# Patient Record
Sex: Female | Born: 1940 | Race: White | Hispanic: No | Marital: Single | Smoking: Former smoker
Health system: Southern US, Community
[De-identification: ages and names within clinical notes are randomized; demographics above are authoritative.]

## PROBLEM LIST (undated history)

## (undated) DIAGNOSIS — F419 Anxiety disorder, unspecified: Secondary | ICD-10-CM

## (undated) DIAGNOSIS — I10 Essential (primary) hypertension: Secondary | ICD-10-CM

## (undated) HISTORY — PX: CHOLECYSTECTOMY: SHX55

## (undated) HISTORY — PX: HERNIA REPAIR: SHX51

---

## 1997-09-06 ENCOUNTER — Other Ambulatory Visit: Admission: RE | Admit: 1997-09-06 | Discharge: 1997-09-06 | Payer: Self-pay | Admitting: Gynecology

## 1998-10-21 ENCOUNTER — Other Ambulatory Visit: Admission: RE | Admit: 1998-10-21 | Discharge: 1998-10-21 | Payer: Self-pay | Admitting: Gynecology

## 1999-11-03 ENCOUNTER — Other Ambulatory Visit: Admission: RE | Admit: 1999-11-03 | Discharge: 1999-11-03 | Payer: Self-pay | Admitting: Gynecology

## 2001-02-01 ENCOUNTER — Other Ambulatory Visit: Admission: RE | Admit: 2001-02-01 | Discharge: 2001-02-01 | Payer: Self-pay | Admitting: Gynecology

## 2002-02-13 ENCOUNTER — Other Ambulatory Visit: Admission: RE | Admit: 2002-02-13 | Discharge: 2002-02-13 | Payer: Self-pay | Admitting: Gynecology

## 2003-05-28 ENCOUNTER — Other Ambulatory Visit: Admission: RE | Admit: 2003-05-28 | Discharge: 2003-05-28 | Payer: Self-pay | Admitting: Gynecology

## 2004-06-05 ENCOUNTER — Other Ambulatory Visit: Admission: RE | Admit: 2004-06-05 | Discharge: 2004-06-05 | Payer: Self-pay | Admitting: Gynecology

## 2008-12-19 ENCOUNTER — Encounter: Payer: Self-pay | Admitting: Pulmonary Disease

## 2009-03-07 ENCOUNTER — Encounter: Payer: Self-pay | Admitting: Pulmonary Disease

## 2009-03-12 ENCOUNTER — Encounter: Payer: Self-pay | Admitting: Pulmonary Disease

## 2009-04-16 ENCOUNTER — Encounter: Payer: Self-pay | Admitting: Pulmonary Disease

## 2009-04-18 ENCOUNTER — Encounter: Payer: Self-pay | Admitting: Pulmonary Disease

## 2009-05-20 DIAGNOSIS — J449 Chronic obstructive pulmonary disease, unspecified: Secondary | ICD-10-CM

## 2009-05-20 DIAGNOSIS — R059 Cough, unspecified: Secondary | ICD-10-CM | POA: Insufficient documentation

## 2009-05-20 DIAGNOSIS — J309 Allergic rhinitis, unspecified: Secondary | ICD-10-CM

## 2009-05-20 DIAGNOSIS — K219 Gastro-esophageal reflux disease without esophagitis: Secondary | ICD-10-CM | POA: Insufficient documentation

## 2009-05-20 DIAGNOSIS — R05 Cough: Secondary | ICD-10-CM

## 2009-05-20 DIAGNOSIS — J4489 Other specified chronic obstructive pulmonary disease: Secondary | ICD-10-CM

## 2009-05-20 HISTORY — DX: Other specified chronic obstructive pulmonary disease: J44.89

## 2009-05-20 HISTORY — DX: Gastro-esophageal reflux disease without esophagitis: K21.9

## 2009-05-20 HISTORY — DX: Allergic rhinitis, unspecified: J30.9

## 2009-05-20 HISTORY — DX: Cough, unspecified: R05.9

## 2009-05-21 ENCOUNTER — Ambulatory Visit: Payer: Self-pay | Admitting: Pulmonary Disease

## 2009-05-21 DIAGNOSIS — F17200 Nicotine dependence, unspecified, uncomplicated: Secondary | ICD-10-CM

## 2009-05-21 HISTORY — DX: Nicotine dependence, unspecified, uncomplicated: F17.200

## 2009-05-28 ENCOUNTER — Telehealth (INDEPENDENT_AMBULATORY_CARE_PROVIDER_SITE_OTHER): Payer: Self-pay | Admitting: *Deleted

## 2009-06-24 ENCOUNTER — Ambulatory Visit: Payer: Self-pay | Admitting: Pulmonary Disease

## 2009-07-25 ENCOUNTER — Ambulatory Visit: Payer: Self-pay | Admitting: Pulmonary Disease

## 2010-03-27 NOTE — Assessment & Plan Note (Signed)
Summary: CHRONIC COUGH/CB   Primary Provider/Referring Provider:  Dr. Simone Curia  CC:  Pulmonary consult for chronic cough x4 months or longer. SOB with coughing spells..  History of Present Illness: 70 yo female for evaluation of cough.  Her cough has been present for several months.  She was told that she had pneumonia last fall.  With this she developed a cough with with thick, white sputum.  She did not have hemoptysis.  She would also get wheeze at night.  She did not have fever.  She was treated with antibiotics and prednisone.  These seemed to help.  She has been tried on several inhalers, and these seemed to have helped.  Her nose and mouth tend to get dry in the morning.  She has been getting short of breath when she lifts things, but is okay walking on level ground.  She denies leg swelling, or swelling in her glands.  She has not had skin rash or joint swelling.  She was seen by Dr. Jonell Cluck in [place].  She was told that she had a hiatal hernia, and was advised that she would need surgery to fix her reflux and cough.  There is no history of asthma.  She does get seasonal allergies, but has not been using allergy medicines recently.  There is no prior history of pneumonia or TB.  She is from [place], and denies travel history.  There are no recent animal or sick exposures.  She is retired, and used to work Designer, multimedia for sewing machines.  She continues to smoke.  She started at age 66, and smoked up to 2 packs per day.  She is now smoking 1/2 pack per day  She tried using bupropion, but this made her crazy.  Of note is that she is on zestoretic for her blood pressure.  Chest xray from October 20101 showed hyperinflation, flat diaphragms, and barrel chest.  Preventive Screening-Counseling & Management  Alcohol-Tobacco     Smoking Status: current  Current Medications (verified): 1)  Zestoretic 20-25 Mg Tabs (Lisinopril-Hydrochlorothiazide) .Marland Kitchen.. 1 By Mouth  Daily 2)  Paxil 20 Mg Tabs (Paroxetine Hcl) .Marland Kitchen.. 1 By Mouth Daily 3)  Astepro 0.15 % Soln (Azelastine Hcl) .... 2 Sprays in Each Nostril As Needed 4)  Aspirin 325 Mg Tabs (Aspirin) .Marland Kitchen.. 1 By Mouth Daily 5)  Dulera 100-5 Mcg/act Aero (Mometasone Furo-Formoterol Fum) .... 2 Puffs Daily 6)  Omeprazole 20 Mg Cpdr (Omeprazole) .Marland Kitchen.. 1 By Mouth Daily 7)  Methscopolamine Bromide 5 Mg Tabs (Methscopolamine Bromide) .... As Needed  Allergies (verified): No Known Drug Allergies  Past History:  Past Medical History: GERD Irritable bowel syndrome COPD/Emphysema      - Spirometry 05/21/09 FEV1 1.06(56%), FVC 1.50(59%), FEV1% 70 Allergic rhinitis Hypertension Hyperlipidemia Depression Anxiety Osteoarthritis  Past Surgical History: None  Family History: no significant family history  Social History: Married Patient is a current smoker.  52 year smoker x1 ppd  Review of Systems       The patient complains of shortness of breath with activity, productive cough, non-productive cough, and nasal congestion/difficulty breathing through nose.  The patient denies shortness of breath at rest, coughing up blood, chest pain, irregular heartbeats, acid heartburn, indigestion, loss of appetite, weight change, abdominal pain, difficulty swallowing, sore throat, tooth/dental problems, headaches, sneezing, itching, ear ache, anxiety, depression, hand/feet swelling, joint stiffness or pain, rash, change in color of mucus, and fever.    Vital Signs:  Patient profile:   70 year old  female Height:      60 inches (152.40 cm) Weight:      135 pounds (61.36 kg) BMI:     26.46 O2 Sat:      93 % on Room air Temp:     98.3 degrees F (36.83 degrees C) oral Pulse rate:   100 / minute BP sitting:   138 / 88  (left arm) Cuff size:   regular  Vitals Entered By: Michel Bickers CMA (May 21, 2009 3:04 PM)  O2 Sat at Rest %:  93 O2 Flow:  Room air  Physical Exam  General:  thin.   Eyes:  PERRLA and EOMI.    Nose:  no deformity, discharge, inflammation, or lesions Mouth:  no deformity or lesions Neck:  no JVD.   Chest Wall:  kyphosis and barrel chest.   Lungs:  decreased breath sounds, prolonged exhalation, no wheezing or rales Heart:  regular rhythm, normal rate, and no murmurs.   Abdomen:  soft, nontender, no masses Pulses:  pulses normal Extremities:  no clubbing, cyanosis, edema, or deformity noted Neurologic:  CN II-XII grossly intact with normal reflexes, coordination, muscle strength and tone Cervical Nodes:  no significant adenopathy Psych:  alert and cooperative; normal mood and affect; normal attention span and concentration   Pulmonary Function Test Date: 05/21/2009 3:24 PM Gender: Female  Pre-Spirometry FVC    Value: 1.50 L/min   % Pred: 59.80 % FEV1    Value: 1.06 L     Pred: 1.89 L     % Pred: 56.40 % FEV1/FVC  Value: 70.99 %     % Pred: 93.50 %  Impression & Recommendations:  Problem # 1:  COUGH (ICD-786.2) This is likely multifactorial.    She has an extensive history of smoking and spirometry shows obstructive lung disease.  She has history of rhinitis.  She has history of GERD.  She is on an ACE inhibitor for her blood pressure.  Please see below for details regarding each.   Problem # 2:  C O P D (ICD-496) She has an extensive history of smoking.  Her spirometry today shows evidence for airflow obstruction.  I will continue her on dulera.  I will also start her on spiriva and proair.  I will attempt to get copies of her records from Dr. Margarita Rana office.  Problem # 3:  ALLERGIC RHINITIS (ICD-477.9) This does not seem to be an active issue.  She is to continue with zyrtec and astepro as needed.  Problem # 4:  COUGH DUE TO ACE INHIBITORS (ICD-786.2) I am concerned that her zestoretic may be contributing to her cough.  Have advised her to discuss with her primary physician about switching to an alternative anti-hypertensive medication if possible.  Problem #  5:  G E R D (ICD-530.81)  She is to continue on PPI per her primary care physician.  Problem # 6:  TOBACCO ABUSE (ICD-305.1)  She was not able to tolerate bupropion.  I explained to her how continued smoking is contributing to her health problems, and offered assistance with smoking cessation.  Will discuss further at next visit.  Medications Added to Medication List This Visit: 1)  Spiriva Handihaler 18 Mcg Caps (Tiotropium bromide monohydrate) .... One puff once daily 2)  Dulera 100-5 Mcg/act Aero (Mometasone furo-formoterol fum) .... 2 puffs daily 3)  Proair Hfa 108 (90 Base) Mcg/act Aers (Albuterol sulfate) .... Two puffs up to four times per day as needed for cough, wheeze, or chest congestion  4)  Omeprazole 20 Mg Cpdr (Omeprazole) .Marland Kitchen.. 1 by mouth daily 5)  Astepro 0.15 % Soln (Azelastine hcl) .... 2 sprays in each nostril as needed 6)  Methscopolamine Bromide 5 Mg Tabs (Methscopolamine bromide) .... As needed  Complete Medication List: 1)  Spiriva Handihaler 18 Mcg Caps (Tiotropium bromide monohydrate) .... One puff once daily 2)  Dulera 100-5 Mcg/act Aero (Mometasone furo-formoterol fum) .... 2 puffs daily 3)  Proair Hfa 108 (90 Base) Mcg/act Aers (Albuterol sulfate) .... Two puffs up to four times per day as needed for cough, wheeze, or chest congestion 4)  Zestoretic 20-25 Mg Tabs (Lisinopril-hydrochlorothiazide) .Marland Kitchen.. 1 by mouth daily 5)  Aspirin 325 Mg Tabs (Aspirin) .Marland Kitchen.. 1 by mouth daily 6)  Paxil 20 Mg Tabs (Paroxetine hcl) .Marland Kitchen.. 1 by mouth daily 7)  Omeprazole 20 Mg Cpdr (Omeprazole) .Marland Kitchen.. 1 by mouth daily 8)  Astepro 0.15 % Soln (Azelastine hcl) .... 2 sprays in each nostril as needed 9)  Methscopolamine Bromide 5 Mg Tabs (Methscopolamine bromide) .... As needed  Other Orders: Consultation Level IV (36644) Spirometry w/Graph (94010) Tobacco use cessation intermediate 3-10 minutes (03474)  Patient Instructions: 1)  Spiriva one puff once daily  2)  Dulera two puffs two  times a day, and rinse mouth after using 3)  Proair two puffs up to four times per day as needed for cough, wheeze, or chest congestion 4)  Talk to Dr. Nedra Hai about switching zestoretic to another blood pressure medicine 5)  Will get copy of medical records from Dr. Jonell Cluck 6)  Follow up in 4 weeks Prescriptions: PROAIR HFA 108 (90 BASE) MCG/ACT AERS (ALBUTEROL SULFATE) two puffs up to four times per day as needed for cough, wheeze, or chest congestion  #1 x 3   Entered and Authorized by:   Coralyn Helling MD   Signed by:   Coralyn Helling MD on 05/21/2009   Method used:   Print then Give to Patient   RxID:   [id number] SPIRIVA HANDIHALER 18 MCG CAPS (TIOTROPIUM BROMIDE MONOHYDRATE) one puff once daily  #30 x 3   Entered and Authorized by:   Coralyn Helling MD   Signed by:   Coralyn Helling MD on 05/21/2009   Method used:   Print then Give to Patient   RxID:   [id number]    CardioPerfect Spirometry  ID: [id number] Patient: Genella Mech DOB: 1940-10-06 Age: 70 Years Old Sex: Female Race: White Height: 60 Weight: 135 Status: Unconfirmed Past Medical History:  Current Problems:  OSTEOARTHRITIS, GENERALIZED, 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   Appended Document: CHRONIC COUGH/CB Received records from Dr. Blenda Nicely.    CT chest from 03/12/09 showed emphysema, and large hiatal hernia.    Overnight oximetry from 04/16/09 showed basal SpO2 90%, with 24 min below  88%.  Total test time 7hr 41 min.  Lowest SpO2 82%.

## 2010-03-27 NOTE — Procedures (Signed)
Summary: O2 Sat  O2 Sat   Imported By: Sherian Rein 06/13/2009 09:12:09  _____________________________________________________________________  External Attachment:    Type:   Image     Comment:   External Document

## 2010-03-27 NOTE — Assessment & Plan Note (Signed)
Summary: cough back/apc   Primary Provider/Referring Provider:  Dr. Simone Curia  CC:  Follow up.  Pt states cough returned approx 3 wks ago.  Cough prod with thick clear mucus.  .  History of Present Illness: 70 yo female with ACE cough, COPD, tobacco abuse, GERD, and rhinitis.  He cough started again about 3 weeks ago.  She is bringing up clear, thick sputum.  She denies hemoptysis.  She has not had fever.  She gets a wheeze from her throat.  Her cough is worse when she lays flat or when she leans forward.  She has a nasally voice, and some sinus congestion.  She feels like something is stuck in her throat, but she can't cough it up.  She has tried using proair, but this does not make much difference.  She has tried cough syrup w/o benefit.  She is not having reflux symptoms.    She has not used her nasonex for several weeks.  It was shortly after she stopped nasonex that her cough symptoms seemed to recur.  She continues to smoke 1/2 ppd.  She is working on [employer] on her own.  She says that she was able to quit previously on her own for one year.  Current Medications (verified): 1)  Proair Hfa 108 (90 Base) Mcg/act Aers (Albuterol Sulfate) .... Two Puffs Up To Four Times Per Day As Needed For Cough, Wheeze, or Chest Congestion 2)  Atenolol-Chlorthalidone 50-25 Mg Tabs (Atenolol-Chlorthalidone) .... Take 1 Tablet By Mouth Once A Day 3)  Aspirin 325 Mg Tabs (Aspirin) .Marland Kitchen.. 1 By Mouth Daily 4)  Paxil 20 Mg Tabs (Paroxetine Hcl) .Marland Kitchen.. 1 By Mouth Daily 5)  Methscopolamine Bromide 5 Mg Tabs (Methscopolamine Bromide) .... As Needed  Allergies (verified): No Known Drug Allergies  Past History:  Past Medical History: GERD Hiatal hernia Irritable bowel syndrome COPD/Emphysema      - Spirometry 05/21/09 FEV1 1.06(56%),  FEV1% 70 ACE inhibitor cough Allergic rhinitis Hypertension Hyperlipidemia Depression Anxiety Osteoarthritis  Past Surgical History: Reviewed history from 05/21/2009  and no changes required. None  Vital Signs:  Patient profile:   70 year old female Height:      59 inches Weight:      131 pounds BMI:     26.55 O2 Sat:      94 % on Room air Temp:     97.7 degrees F oral Pulse rate:   62 / minute BP sitting:   118 / 76  (left arm) Cuff size:   regular  Vitals Entered By: Gweneth Dimitri RN (July 25, 2009 9:58 AM)  O2 Flow:  Room air CC: Follow up.  Pt states cough returned approx 3 wks ago.  Cough prod with thick clear mucus.   Comments Medications reviewed with patient Daytime contact number verified with patient. Gweneth Dimitri RN  July 25, 2009 9:58 AM    Physical Exam  General:  thin.   Ears:  TMs intact and clear with normal canals Nose:  clear discharge, pale mucosa Mouth:  no deformity or lesions Neck:  no JVD.   Lungs:  decreased breath sounds, prolonged exhalation, no wheezing or rales Heart:  regular rhythm, normal rate, and no murmurs.   Extremities:  no clubbing, cyanosis, edema, or deformity noted Cervical Nodes:  no significant adenopathy   Impression & Recommendations:  Problem # 1:  COUGH (ICD-786.2) She has recurrent cough.  She has not been on her sinus regimen.  I think most of  her symptoms are coming from her upper airway.  Will treat her sinuses and post-nasal drip.  If she does not have improvement, will then need to consider CT sinus and ENT evaluation.  Problem # 2:  TOBACCO ABUSE (ICD-305.1) She has continued tobacco abuse.  She is trying to quit on her own, and did this previously.  I have encouraged her to continue with her efforts, and offerred assistance with this.  Problem # 3:  ALLERGIC RHINITIS (ICD-477.9) I think a good portion of her cough is related to post-nasal drip and upper airway irritation.  Will have her resume nasal irrigation and nasonex on a daily basis for at least the next 3 or 4 weeks.  She will call me if there is no improvement.  Problem # 4:  C O P D (ICD-496) She has a history of  tobacco abuse, and airflow obstruction on spirometery.  However, she has not noticed much benefit from inhaler therapy.  Will assess how much her symptoms improve after optimizing her sinus regimen.  If she continues to have cough, then she may need to resume scheduled inhaler therapy.  Medications Added to Medication List This Visit: 1)  Atenolol-chlorthalidone 50-25 Mg Tabs (Atenolol-chlorthalidone) .... Take 1 tablet by mouth once a day 2)  Nasonex 50 Mcg/act Susp (Mometasone furoate) .... Two sprays once daily  Complete Medication List: 1)  Proair Hfa 108 (90 Base) Mcg/act Aers (Albuterol sulfate) .... Two puffs up to four times per day as needed for cough, wheeze, or chest congestion 2)  Atenolol-chlorthalidone 50-25 Mg Tabs (Atenolol-chlorthalidone) .... Take 1 tablet by mouth once a day 3)  Aspirin 325 Mg Tabs (Aspirin) .Marland Kitchen.. 1 by mouth daily 4)  Paxil 20 Mg Tabs (Paroxetine hcl) .Marland Kitchen.. 1 by mouth daily 5)  Methscopolamine Bromide 5 Mg Tabs (Methscopolamine bromide) .... As needed 6)  Nasonex 50 Mcg/act Susp (Mometasone furoate) .... Two sprays once daily  Other Orders: Est. Patient Level III (63875) Prescription Created Electronically [phone]) Tobacco use cessation intermediate 3-10 minutes (95188)  Patient Instructions: 1)  Nasal irrigation (saline sinus rinse) once daily  2)  Nasonex two sprays once daily  3)  Follow up in 3 months  Prescriptions: NASONEX 50 MCG/ACT SUSP (MOMETASONE FUROATE) two sprays once daily  #1 x 6   Entered and Authorized by:   Coralyn Helling MD   Signed by:   Coralyn Helling MD on 07/25/2009   Method used:   Electronically to        [facility]. [phone]* (retail)       [address]       [place], [place]  [address]       Ph: [phone] or [phone]       Fax: [phone]   RxID:   [id number]    Immunization History:  Influenza Immunization History:    Influenza:  historical (02/24/2007)

## 2010-03-27 NOTE — Letter (Signed)
Summary: [facility]  [facility]   Imported By: Sherian Rein 06/13/2009 09:09:13  _____________________________________________________________________  External Attachment:    Type:   Image     Comment:   External Document

## 2010-03-27 NOTE — Assessment & Plan Note (Signed)
Summary: 4 weeks/mbw   Visit Type:  Follow-up Primary Provider/Referring Provider:  Dr. Simone Curia  CC:  The patient says her cough and sob have improved. She is only using the Spiriva every other day.Heather Molina  History of Present Illness: 70 yo female with cough.  Since her last visit she had her blood pressure medications adjusted.  Her breathing and cough have improved considerably since then.  She is only using   had bp meds changed dulera as needed, and spiriva every other day.  She has not needed to use her rescue inhaler at all.  Her sinuses are doing okay, and she is not having much problem with her reflux.  Current Medications (verified): 1)  Spiriva Handihaler 18 Mcg Caps (Tiotropium Bromide Monohydrate) .... One Capsule in Handihaler Every Other Day 2)  Dulera 100-5 Mcg/act Aero (Mometasone Furo-Formoterol Fum) .... 2 Puffs Daily 3)  Proair Hfa 108 (90 Base) Mcg/act Aers (Albuterol Sulfate) .... Two Puffs Up To Four Times Per Day As Needed For Cough, Wheeze, or Chest Congestion 4)  Bystolic 10 Mg Tabs (Nebivolol Hcl) .Heather Molina.. 1 By Mouth Daily 5)  Aspirin 325 Mg Tabs (Aspirin) .Heather Molina.. 1 By Mouth Daily 6)  Paxil 20 Mg Tabs (Paroxetine Hcl) .Heather Molina.. 1 By Mouth Daily 7)  Methscopolamine Bromide 5 Mg Tabs (Methscopolamine Bromide) .... As Needed  Allergies (verified): No Known Drug Allergies  Past History:  Past Medical History: GERD Hiatal hernia Irritable bowel syndrome COPD/Emphysema      - Spirometry 05/21/09 FEV1 1.06(56%), FVC 1.50(59%), FEV1% 70 ACE inhibitor cough Allergic rhinitis Hypertension Hyperlipidemia Depression Anxiety Osteoarthritis  Past Surgical History: Reviewed history from 05/21/2009 and no changes required. None  Vital Signs:  Patient profile:   70 year old female Height:      60 inches (152.40 cm) Weight:      140 pounds (63.64 kg) BMI:     27.44 O2 Sat:      92 % on Room air Temp:     97.9 degrees F (36.61 degrees C) oral Pulse rate:   87 /  minute BP sitting:   110 / 68  (left arm) Cuff size:   regular  Vitals Entered By: Michel Bickers CMA (Jun 24, 2009 3:01 PM)  O2 Sat at Rest %:  92 O2 Flow:  Room air  Physical Exam  Nose:  no deformity, discharge, inflammation, or lesions Mouth:  no deformity or lesions Neck:  no JVD.   Lungs:  decreased breath sounds, prolonged exhalation, no wheezing or rales Heart:  regular rhythm, normal rate, and no murmurs.   Extremities:  no clubbing, cyanosis, edema, or deformity noted Cervical Nodes:  no significant adenopathy   Impression & Recommendations:  Problem # 1:  COUGH DUE TO ACE INHIBITORS (ICD-786.2) Her cough and breathing has improved considerably since her blood pressure medicine was changed.  She has since also been able to use her inhaler regimen much less often.  I think the majority of her recent respiratory symptoms were related to use of ACE inhibitor.  Problem # 2:  TOBACCO ABUSE (ICD-305.1) Again emphasized the need for complete smoking cessation.  Problem # 3:  C O P D (ICD-496) I am not sure how much her COPD is actually contributing to her recent symptoms.  I will have her stop dulera and spiriva.  She is to continue with as needed albuterol.  Depending on her symptoms will decide if she needs to restart maintenance inhaler therapy.  Problem # 4:  ALLERGIC RHINITIS (ICD-477.9) This appears to be stable.  She is to continue on her sinus regimen.  Problem # 5:  G E R D (ICD-530.81)  She is to continue on PPI per her primary care physician.  Medications Added to Medication List This Visit: 1)  Bystolic 10 Mg Tabs (Nebivolol hcl) .Heather Molina.. 1 by mouth daily  Complete Medication List: 1)  Proair Hfa 108 (90 Base) Mcg/act Aers (Albuterol sulfate) .... Two puffs up to four times per day as needed for cough, wheeze, or chest congestion 2)  Bystolic 10 Mg Tabs (Nebivolol hcl) .Heather Molina.. 1 by mouth daily 3)  Aspirin 325 Mg Tabs (Aspirin) .Heather Molina.. 1 by mouth daily 4)  Paxil 20 Mg  Tabs (Paroxetine hcl) .Heather Molina.. 1 by mouth daily 5)  Methscopolamine Bromide 5 Mg Tabs (Methscopolamine bromide) .... As needed  Other Orders: Est. Patient Level III (47829)  Patient Instructions: 1)  Stop spiriva 2)  Stop dulera 3)  Proair two puffs up to four times per day as needed for cough, wheeze, chest congestion or shortness of breath

## 2010-03-27 NOTE — Progress Notes (Signed)
Summary: Records received from [facility]  Records received from [facility]. Advised Michel Bickers. Told to send records up to her attention. Dena Chavis  May 28, 2009 10:01 AM

## 2011-03-11 DIAGNOSIS — Z23 Encounter for immunization: Secondary | ICD-10-CM | POA: Diagnosis not present

## 2011-05-02 DIAGNOSIS — I10 Essential (primary) hypertension: Secondary | ICD-10-CM | POA: Diagnosis not present

## 2011-05-02 DIAGNOSIS — E7800 Pure hypercholesterolemia, unspecified: Secondary | ICD-10-CM | POA: Diagnosis not present

## 2011-05-02 DIAGNOSIS — Z6828 Body mass index (BMI) 28.0-28.9, adult: Secondary | ICD-10-CM | POA: Diagnosis not present

## 2011-06-13 DIAGNOSIS — E7800 Pure hypercholesterolemia, unspecified: Secondary | ICD-10-CM | POA: Diagnosis not present

## 2011-06-13 DIAGNOSIS — Z6828 Body mass index (BMI) 28.0-28.9, adult: Secondary | ICD-10-CM | POA: Diagnosis not present

## 2011-06-13 DIAGNOSIS — I10 Essential (primary) hypertension: Secondary | ICD-10-CM | POA: Diagnosis not present

## 2011-09-22 DIAGNOSIS — J209 Acute bronchitis, unspecified: Secondary | ICD-10-CM | POA: Diagnosis not present

## 2011-10-16 DIAGNOSIS — F329 Major depressive disorder, single episode, unspecified: Secondary | ICD-10-CM | POA: Diagnosis not present

## 2011-10-16 DIAGNOSIS — E7800 Pure hypercholesterolemia, unspecified: Secondary | ICD-10-CM | POA: Diagnosis not present

## 2011-10-16 DIAGNOSIS — Z23 Encounter for immunization: Secondary | ICD-10-CM | POA: Diagnosis not present

## 2011-10-16 DIAGNOSIS — I10 Essential (primary) hypertension: Secondary | ICD-10-CM | POA: Diagnosis not present

## 2011-12-07 DIAGNOSIS — Z23 Encounter for immunization: Secondary | ICD-10-CM | POA: Diagnosis not present

## 2011-12-16 DIAGNOSIS — Z1331 Encounter for screening for depression: Secondary | ICD-10-CM | POA: Diagnosis not present

## 2011-12-16 DIAGNOSIS — I10 Essential (primary) hypertension: Secondary | ICD-10-CM | POA: Diagnosis not present

## 2011-12-16 DIAGNOSIS — Z9181 History of falling: Secondary | ICD-10-CM | POA: Diagnosis not present

## 2011-12-16 DIAGNOSIS — Z6828 Body mass index (BMI) 28.0-28.9, adult: Secondary | ICD-10-CM | POA: Diagnosis not present

## 2011-12-16 DIAGNOSIS — E7800 Pure hypercholesterolemia, unspecified: Secondary | ICD-10-CM | POA: Diagnosis not present

## 2012-04-18 DIAGNOSIS — I10 Essential (primary) hypertension: Secondary | ICD-10-CM | POA: Diagnosis not present

## 2012-04-18 DIAGNOSIS — J449 Chronic obstructive pulmonary disease, unspecified: Secondary | ICD-10-CM | POA: Diagnosis not present

## 2012-04-18 DIAGNOSIS — Z6827 Body mass index (BMI) 27.0-27.9, adult: Secondary | ICD-10-CM | POA: Diagnosis not present

## 2012-04-18 DIAGNOSIS — E7800 Pure hypercholesterolemia, unspecified: Secondary | ICD-10-CM | POA: Diagnosis not present

## 2012-04-27 DIAGNOSIS — Z13820 Encounter for screening for osteoporosis: Secondary | ICD-10-CM | POA: Diagnosis not present

## 2012-04-27 DIAGNOSIS — M899 Disorder of bone, unspecified: Secondary | ICD-10-CM | POA: Diagnosis not present

## 2012-04-27 DIAGNOSIS — M949 Disorder of cartilage, unspecified: Secondary | ICD-10-CM | POA: Diagnosis not present

## 2012-07-19 DIAGNOSIS — E7800 Pure hypercholesterolemia, unspecified: Secondary | ICD-10-CM | POA: Diagnosis not present

## 2012-07-19 DIAGNOSIS — I10 Essential (primary) hypertension: Secondary | ICD-10-CM | POA: Diagnosis not present

## 2012-07-19 DIAGNOSIS — Z6828 Body mass index (BMI) 28.0-28.9, adult: Secondary | ICD-10-CM | POA: Diagnosis not present

## 2012-07-19 DIAGNOSIS — J209 Acute bronchitis, unspecified: Secondary | ICD-10-CM | POA: Diagnosis not present

## 2012-07-19 DIAGNOSIS — M810 Age-related osteoporosis without current pathological fracture: Secondary | ICD-10-CM | POA: Diagnosis not present

## 2012-08-19 DIAGNOSIS — M810 Age-related osteoporosis without current pathological fracture: Secondary | ICD-10-CM | POA: Diagnosis not present

## 2012-08-19 DIAGNOSIS — J449 Chronic obstructive pulmonary disease, unspecified: Secondary | ICD-10-CM | POA: Diagnosis not present

## 2012-08-19 DIAGNOSIS — Z6828 Body mass index (BMI) 28.0-28.9, adult: Secondary | ICD-10-CM | POA: Diagnosis not present

## 2012-08-19 DIAGNOSIS — I10 Essential (primary) hypertension: Secondary | ICD-10-CM | POA: Diagnosis not present

## 2012-10-17 DIAGNOSIS — T148 Other injury of unspecified body region: Secondary | ICD-10-CM | POA: Diagnosis not present

## 2012-11-18 DIAGNOSIS — E8881 Metabolic syndrome: Secondary | ICD-10-CM | POA: Diagnosis not present

## 2012-11-18 DIAGNOSIS — I10 Essential (primary) hypertension: Secondary | ICD-10-CM | POA: Diagnosis not present

## 2012-11-18 DIAGNOSIS — Z6829 Body mass index (BMI) 29.0-29.9, adult: Secondary | ICD-10-CM | POA: Diagnosis not present

## 2012-11-18 DIAGNOSIS — Z23 Encounter for immunization: Secondary | ICD-10-CM | POA: Diagnosis not present

## 2012-11-18 DIAGNOSIS — J449 Chronic obstructive pulmonary disease, unspecified: Secondary | ICD-10-CM | POA: Diagnosis not present

## 2012-12-17 DIAGNOSIS — J069 Acute upper respiratory infection, unspecified: Secondary | ICD-10-CM | POA: Diagnosis not present

## 2012-12-17 DIAGNOSIS — B9789 Other viral agents as the cause of diseases classified elsewhere: Secondary | ICD-10-CM | POA: Diagnosis not present

## 2013-03-28 DIAGNOSIS — J209 Acute bronchitis, unspecified: Secondary | ICD-10-CM | POA: Diagnosis not present

## 2013-03-28 DIAGNOSIS — I10 Essential (primary) hypertension: Secondary | ICD-10-CM | POA: Diagnosis not present

## 2013-03-28 DIAGNOSIS — Z1331 Encounter for screening for depression: Secondary | ICD-10-CM | POA: Diagnosis not present

## 2013-03-28 DIAGNOSIS — E7800 Pure hypercholesterolemia, unspecified: Secondary | ICD-10-CM | POA: Diagnosis not present

## 2013-03-28 DIAGNOSIS — Z9181 History of falling: Secondary | ICD-10-CM | POA: Diagnosis not present

## 2013-03-28 DIAGNOSIS — E8881 Metabolic syndrome: Secondary | ICD-10-CM | POA: Diagnosis not present

## 2013-05-05 DIAGNOSIS — I10 Essential (primary) hypertension: Secondary | ICD-10-CM | POA: Diagnosis not present

## 2013-05-05 DIAGNOSIS — E8881 Metabolic syndrome: Secondary | ICD-10-CM | POA: Diagnosis not present

## 2013-05-05 DIAGNOSIS — J209 Acute bronchitis, unspecified: Secondary | ICD-10-CM | POA: Diagnosis not present

## 2013-05-05 DIAGNOSIS — J449 Chronic obstructive pulmonary disease, unspecified: Secondary | ICD-10-CM | POA: Diagnosis not present

## 2013-06-26 DIAGNOSIS — I10 Essential (primary) hypertension: Secondary | ICD-10-CM | POA: Diagnosis not present

## 2013-06-26 DIAGNOSIS — J449 Chronic obstructive pulmonary disease, unspecified: Secondary | ICD-10-CM | POA: Diagnosis not present

## 2013-06-26 DIAGNOSIS — E7800 Pure hypercholesterolemia, unspecified: Secondary | ICD-10-CM | POA: Diagnosis not present

## 2013-06-26 DIAGNOSIS — E8881 Metabolic syndrome: Secondary | ICD-10-CM | POA: Diagnosis not present

## 2013-06-26 DIAGNOSIS — F329 Major depressive disorder, single episode, unspecified: Secondary | ICD-10-CM | POA: Diagnosis not present

## 2013-06-26 DIAGNOSIS — F3289 Other specified depressive episodes: Secondary | ICD-10-CM | POA: Diagnosis not present

## 2013-08-15 DIAGNOSIS — H524 Presbyopia: Secondary | ICD-10-CM | POA: Diagnosis not present

## 2013-08-15 DIAGNOSIS — H52229 Regular astigmatism, unspecified eye: Secondary | ICD-10-CM | POA: Diagnosis not present

## 2013-08-15 DIAGNOSIS — H2589 Other age-related cataract: Secondary | ICD-10-CM | POA: Diagnosis not present

## 2013-08-15 DIAGNOSIS — H5200 Hypermetropia, unspecified eye: Secondary | ICD-10-CM | POA: Diagnosis not present

## 2013-08-15 DIAGNOSIS — I10 Essential (primary) hypertension: Secondary | ICD-10-CM | POA: Diagnosis not present

## 2013-11-09 DIAGNOSIS — E7800 Pure hypercholesterolemia, unspecified: Secondary | ICD-10-CM | POA: Diagnosis not present

## 2013-11-09 DIAGNOSIS — I10 Essential (primary) hypertension: Secondary | ICD-10-CM | POA: Diagnosis not present

## 2013-11-09 DIAGNOSIS — E8881 Metabolic syndrome: Secondary | ICD-10-CM | POA: Diagnosis not present

## 2013-11-09 DIAGNOSIS — F17200 Nicotine dependence, unspecified, uncomplicated: Secondary | ICD-10-CM | POA: Diagnosis not present

## 2013-11-09 DIAGNOSIS — Z23 Encounter for immunization: Secondary | ICD-10-CM | POA: Diagnosis not present

## 2013-11-09 DIAGNOSIS — K589 Irritable bowel syndrome without diarrhea: Secondary | ICD-10-CM | POA: Diagnosis not present

## 2014-02-27 DIAGNOSIS — E8881 Metabolic syndrome: Secondary | ICD-10-CM | POA: Diagnosis not present

## 2014-02-27 DIAGNOSIS — J449 Chronic obstructive pulmonary disease, unspecified: Secondary | ICD-10-CM | POA: Diagnosis not present

## 2014-02-27 DIAGNOSIS — M810 Age-related osteoporosis without current pathological fracture: Secondary | ICD-10-CM | POA: Diagnosis not present

## 2014-02-27 DIAGNOSIS — Z1389 Encounter for screening for other disorder: Secondary | ICD-10-CM | POA: Diagnosis not present

## 2014-02-27 DIAGNOSIS — E780 Pure hypercholesterolemia: Secondary | ICD-10-CM | POA: Diagnosis not present

## 2014-02-27 DIAGNOSIS — Z9181 History of falling: Secondary | ICD-10-CM | POA: Diagnosis not present

## 2014-02-27 DIAGNOSIS — I10 Essential (primary) hypertension: Secondary | ICD-10-CM | POA: Diagnosis not present

## 2014-02-27 DIAGNOSIS — K589 Irritable bowel syndrome without diarrhea: Secondary | ICD-10-CM | POA: Diagnosis not present

## 2014-02-27 DIAGNOSIS — F329 Major depressive disorder, single episode, unspecified: Secondary | ICD-10-CM | POA: Diagnosis not present

## 2014-02-27 DIAGNOSIS — J309 Allergic rhinitis, unspecified: Secondary | ICD-10-CM | POA: Diagnosis not present

## 2014-02-27 DIAGNOSIS — K219 Gastro-esophageal reflux disease without esophagitis: Secondary | ICD-10-CM | POA: Diagnosis not present

## 2014-02-27 DIAGNOSIS — F419 Anxiety disorder, unspecified: Secondary | ICD-10-CM | POA: Diagnosis not present

## 2014-06-11 DIAGNOSIS — F419 Anxiety disorder, unspecified: Secondary | ICD-10-CM | POA: Diagnosis not present

## 2014-06-11 DIAGNOSIS — J449 Chronic obstructive pulmonary disease, unspecified: Secondary | ICD-10-CM | POA: Diagnosis not present

## 2014-06-11 DIAGNOSIS — F329 Major depressive disorder, single episode, unspecified: Secondary | ICD-10-CM | POA: Diagnosis not present

## 2014-06-11 DIAGNOSIS — E780 Pure hypercholesterolemia: Secondary | ICD-10-CM | POA: Diagnosis not present

## 2014-06-11 DIAGNOSIS — M810 Age-related osteoporosis without current pathological fracture: Secondary | ICD-10-CM | POA: Diagnosis not present

## 2014-06-11 DIAGNOSIS — J309 Allergic rhinitis, unspecified: Secondary | ICD-10-CM | POA: Diagnosis not present

## 2014-06-11 DIAGNOSIS — E8881 Metabolic syndrome: Secondary | ICD-10-CM | POA: Diagnosis not present

## 2014-06-11 DIAGNOSIS — J209 Acute bronchitis, unspecified: Secondary | ICD-10-CM | POA: Diagnosis not present

## 2014-06-11 DIAGNOSIS — I10 Essential (primary) hypertension: Secondary | ICD-10-CM | POA: Diagnosis not present

## 2014-06-11 DIAGNOSIS — K589 Irritable bowel syndrome without diarrhea: Secondary | ICD-10-CM | POA: Diagnosis not present

## 2014-06-11 DIAGNOSIS — K219 Gastro-esophageal reflux disease without esophagitis: Secondary | ICD-10-CM | POA: Diagnosis not present

## 2014-06-11 DIAGNOSIS — Z720 Tobacco use: Secondary | ICD-10-CM | POA: Diagnosis not present

## 2014-06-28 DIAGNOSIS — E780 Pure hypercholesterolemia: Secondary | ICD-10-CM | POA: Diagnosis not present

## 2014-06-28 DIAGNOSIS — K589 Irritable bowel syndrome without diarrhea: Secondary | ICD-10-CM | POA: Diagnosis not present

## 2014-06-28 DIAGNOSIS — E8881 Metabolic syndrome: Secondary | ICD-10-CM | POA: Diagnosis not present

## 2014-06-28 DIAGNOSIS — J309 Allergic rhinitis, unspecified: Secondary | ICD-10-CM | POA: Diagnosis not present

## 2014-06-28 DIAGNOSIS — F419 Anxiety disorder, unspecified: Secondary | ICD-10-CM | POA: Diagnosis not present

## 2014-06-28 DIAGNOSIS — Z6827 Body mass index (BMI) 27.0-27.9, adult: Secondary | ICD-10-CM | POA: Diagnosis not present

## 2014-06-28 DIAGNOSIS — I10 Essential (primary) hypertension: Secondary | ICD-10-CM | POA: Diagnosis not present

## 2014-06-28 DIAGNOSIS — F329 Major depressive disorder, single episode, unspecified: Secondary | ICD-10-CM | POA: Diagnosis not present

## 2014-06-28 DIAGNOSIS — K219 Gastro-esophageal reflux disease without esophagitis: Secondary | ICD-10-CM | POA: Diagnosis not present

## 2014-06-28 DIAGNOSIS — J449 Chronic obstructive pulmonary disease, unspecified: Secondary | ICD-10-CM | POA: Diagnosis not present

## 2014-06-28 DIAGNOSIS — Z720 Tobacco use: Secondary | ICD-10-CM | POA: Diagnosis not present

## 2014-06-28 DIAGNOSIS — M810 Age-related osteoporosis without current pathological fracture: Secondary | ICD-10-CM | POA: Diagnosis not present

## 2014-07-23 DIAGNOSIS — R05 Cough: Secondary | ICD-10-CM | POA: Diagnosis not present

## 2014-10-30 DIAGNOSIS — F419 Anxiety disorder, unspecified: Secondary | ICD-10-CM | POA: Diagnosis not present

## 2014-10-30 DIAGNOSIS — K589 Irritable bowel syndrome without diarrhea: Secondary | ICD-10-CM | POA: Diagnosis not present

## 2014-10-30 DIAGNOSIS — F329 Major depressive disorder, single episode, unspecified: Secondary | ICD-10-CM | POA: Diagnosis not present

## 2014-10-30 DIAGNOSIS — E780 Pure hypercholesterolemia: Secondary | ICD-10-CM | POA: Diagnosis not present

## 2014-10-30 DIAGNOSIS — I10 Essential (primary) hypertension: Secondary | ICD-10-CM | POA: Diagnosis not present

## 2014-10-30 DIAGNOSIS — Z1231 Encounter for screening mammogram for malignant neoplasm of breast: Secondary | ICD-10-CM | POA: Diagnosis not present

## 2014-10-30 DIAGNOSIS — J309 Allergic rhinitis, unspecified: Secondary | ICD-10-CM | POA: Diagnosis not present

## 2014-10-30 DIAGNOSIS — Z720 Tobacco use: Secondary | ICD-10-CM | POA: Diagnosis not present

## 2014-10-30 DIAGNOSIS — J449 Chronic obstructive pulmonary disease, unspecified: Secondary | ICD-10-CM | POA: Diagnosis not present

## 2014-10-30 DIAGNOSIS — M810 Age-related osteoporosis without current pathological fracture: Secondary | ICD-10-CM | POA: Diagnosis not present

## 2014-10-30 DIAGNOSIS — E8881 Metabolic syndrome: Secondary | ICD-10-CM | POA: Diagnosis not present

## 2014-10-30 DIAGNOSIS — K219 Gastro-esophageal reflux disease without esophagitis: Secondary | ICD-10-CM | POA: Diagnosis not present

## 2014-11-09 DIAGNOSIS — Z1231 Encounter for screening mammogram for malignant neoplasm of breast: Secondary | ICD-10-CM | POA: Diagnosis not present

## 2015-01-14 DIAGNOSIS — J209 Acute bronchitis, unspecified: Secondary | ICD-10-CM | POA: Diagnosis not present

## 2015-01-14 DIAGNOSIS — F419 Anxiety disorder, unspecified: Secondary | ICD-10-CM | POA: Diagnosis not present

## 2015-01-14 DIAGNOSIS — J449 Chronic obstructive pulmonary disease, unspecified: Secondary | ICD-10-CM | POA: Diagnosis not present

## 2015-01-14 DIAGNOSIS — K589 Irritable bowel syndrome without diarrhea: Secondary | ICD-10-CM | POA: Diagnosis not present

## 2015-01-14 DIAGNOSIS — E8881 Metabolic syndrome: Secondary | ICD-10-CM | POA: Diagnosis not present

## 2015-01-14 DIAGNOSIS — J309 Allergic rhinitis, unspecified: Secondary | ICD-10-CM | POA: Diagnosis not present

## 2015-01-14 DIAGNOSIS — I10 Essential (primary) hypertension: Secondary | ICD-10-CM | POA: Diagnosis not present

## 2015-01-14 DIAGNOSIS — F329 Major depressive disorder, single episode, unspecified: Secondary | ICD-10-CM | POA: Diagnosis not present

## 2015-01-14 DIAGNOSIS — Z720 Tobacco use: Secondary | ICD-10-CM | POA: Diagnosis not present

## 2015-01-14 DIAGNOSIS — K219 Gastro-esophageal reflux disease without esophagitis: Secondary | ICD-10-CM | POA: Diagnosis not present

## 2015-01-14 DIAGNOSIS — E7800 Pure hypercholesterolemia, unspecified: Secondary | ICD-10-CM | POA: Diagnosis not present

## 2015-01-14 DIAGNOSIS — M810 Age-related osteoporosis without current pathological fracture: Secondary | ICD-10-CM | POA: Diagnosis not present

## 2015-01-29 DIAGNOSIS — K219 Gastro-esophageal reflux disease without esophagitis: Secondary | ICD-10-CM | POA: Diagnosis not present

## 2015-01-29 DIAGNOSIS — Z23 Encounter for immunization: Secondary | ICD-10-CM | POA: Diagnosis not present

## 2015-01-29 DIAGNOSIS — E8881 Metabolic syndrome: Secondary | ICD-10-CM | POA: Diagnosis not present

## 2015-01-29 DIAGNOSIS — Z1389 Encounter for screening for other disorder: Secondary | ICD-10-CM | POA: Diagnosis not present

## 2015-01-29 DIAGNOSIS — F329 Major depressive disorder, single episode, unspecified: Secondary | ICD-10-CM | POA: Diagnosis not present

## 2015-01-29 DIAGNOSIS — J309 Allergic rhinitis, unspecified: Secondary | ICD-10-CM | POA: Diagnosis not present

## 2015-01-29 DIAGNOSIS — I10 Essential (primary) hypertension: Secondary | ICD-10-CM | POA: Diagnosis not present

## 2015-01-29 DIAGNOSIS — F419 Anxiety disorder, unspecified: Secondary | ICD-10-CM | POA: Diagnosis not present

## 2015-01-29 DIAGNOSIS — M810 Age-related osteoporosis without current pathological fracture: Secondary | ICD-10-CM | POA: Diagnosis not present

## 2015-01-29 DIAGNOSIS — E7800 Pure hypercholesterolemia, unspecified: Secondary | ICD-10-CM | POA: Diagnosis not present

## 2015-01-29 DIAGNOSIS — J449 Chronic obstructive pulmonary disease, unspecified: Secondary | ICD-10-CM | POA: Diagnosis not present

## 2015-01-29 DIAGNOSIS — Z9181 History of falling: Secondary | ICD-10-CM | POA: Diagnosis not present

## 2015-01-29 DIAGNOSIS — K589 Irritable bowel syndrome without diarrhea: Secondary | ICD-10-CM | POA: Diagnosis not present

## 2015-04-04 DIAGNOSIS — J209 Acute bronchitis, unspecified: Secondary | ICD-10-CM | POA: Diagnosis not present

## 2015-04-04 DIAGNOSIS — Z720 Tobacco use: Secondary | ICD-10-CM | POA: Diagnosis not present

## 2015-04-04 DIAGNOSIS — M810 Age-related osteoporosis without current pathological fracture: Secondary | ICD-10-CM | POA: Diagnosis not present

## 2015-04-04 DIAGNOSIS — J309 Allergic rhinitis, unspecified: Secondary | ICD-10-CM | POA: Diagnosis not present

## 2015-04-04 DIAGNOSIS — F419 Anxiety disorder, unspecified: Secondary | ICD-10-CM | POA: Diagnosis not present

## 2015-04-04 DIAGNOSIS — K589 Irritable bowel syndrome without diarrhea: Secondary | ICD-10-CM | POA: Diagnosis not present

## 2015-04-04 DIAGNOSIS — F329 Major depressive disorder, single episode, unspecified: Secondary | ICD-10-CM | POA: Diagnosis not present

## 2015-04-04 DIAGNOSIS — E7800 Pure hypercholesterolemia, unspecified: Secondary | ICD-10-CM | POA: Diagnosis not present

## 2015-04-04 DIAGNOSIS — E8881 Metabolic syndrome: Secondary | ICD-10-CM | POA: Diagnosis not present

## 2015-04-04 DIAGNOSIS — I10 Essential (primary) hypertension: Secondary | ICD-10-CM | POA: Diagnosis not present

## 2015-04-04 DIAGNOSIS — J449 Chronic obstructive pulmonary disease, unspecified: Secondary | ICD-10-CM | POA: Diagnosis not present

## 2015-04-04 DIAGNOSIS — K219 Gastro-esophageal reflux disease without esophagitis: Secondary | ICD-10-CM | POA: Diagnosis not present

## 2015-04-30 DIAGNOSIS — K589 Irritable bowel syndrome without diarrhea: Secondary | ICD-10-CM | POA: Diagnosis not present

## 2015-04-30 DIAGNOSIS — J309 Allergic rhinitis, unspecified: Secondary | ICD-10-CM | POA: Diagnosis not present

## 2015-04-30 DIAGNOSIS — K219 Gastro-esophageal reflux disease without esophagitis: Secondary | ICD-10-CM | POA: Diagnosis not present

## 2015-04-30 DIAGNOSIS — J449 Chronic obstructive pulmonary disease, unspecified: Secondary | ICD-10-CM | POA: Diagnosis not present

## 2015-04-30 DIAGNOSIS — J209 Acute bronchitis, unspecified: Secondary | ICD-10-CM | POA: Diagnosis not present

## 2015-04-30 DIAGNOSIS — M810 Age-related osteoporosis without current pathological fracture: Secondary | ICD-10-CM | POA: Diagnosis not present

## 2015-04-30 DIAGNOSIS — F17200 Nicotine dependence, unspecified, uncomplicated: Secondary | ICD-10-CM | POA: Diagnosis not present

## 2015-04-30 DIAGNOSIS — F329 Major depressive disorder, single episode, unspecified: Secondary | ICD-10-CM | POA: Diagnosis not present

## 2015-04-30 DIAGNOSIS — E7800 Pure hypercholesterolemia, unspecified: Secondary | ICD-10-CM | POA: Diagnosis not present

## 2015-04-30 DIAGNOSIS — E8881 Metabolic syndrome: Secondary | ICD-10-CM | POA: Diagnosis not present

## 2015-04-30 DIAGNOSIS — Z6826 Body mass index (BMI) 26.0-26.9, adult: Secondary | ICD-10-CM | POA: Diagnosis not present

## 2015-04-30 DIAGNOSIS — I10 Essential (primary) hypertension: Secondary | ICD-10-CM | POA: Diagnosis not present

## 2015-04-30 DIAGNOSIS — F419 Anxiety disorder, unspecified: Secondary | ICD-10-CM | POA: Diagnosis not present

## 2015-08-27 DIAGNOSIS — M542 Cervicalgia: Secondary | ICD-10-CM | POA: Diagnosis not present

## 2015-08-29 DIAGNOSIS — F419 Anxiety disorder, unspecified: Secondary | ICD-10-CM | POA: Diagnosis not present

## 2015-08-29 DIAGNOSIS — Z6826 Body mass index (BMI) 26.0-26.9, adult: Secondary | ICD-10-CM | POA: Diagnosis not present

## 2015-08-29 DIAGNOSIS — K219 Gastro-esophageal reflux disease without esophagitis: Secondary | ICD-10-CM | POA: Diagnosis not present

## 2015-08-29 DIAGNOSIS — E8881 Metabolic syndrome: Secondary | ICD-10-CM | POA: Diagnosis not present

## 2015-08-29 DIAGNOSIS — F329 Major depressive disorder, single episode, unspecified: Secondary | ICD-10-CM | POA: Diagnosis not present

## 2015-08-29 DIAGNOSIS — J449 Chronic obstructive pulmonary disease, unspecified: Secondary | ICD-10-CM | POA: Diagnosis not present

## 2015-08-29 DIAGNOSIS — I10 Essential (primary) hypertension: Secondary | ICD-10-CM | POA: Diagnosis not present

## 2015-08-29 DIAGNOSIS — K589 Irritable bowel syndrome without diarrhea: Secondary | ICD-10-CM | POA: Diagnosis not present

## 2015-08-29 DIAGNOSIS — J309 Allergic rhinitis, unspecified: Secondary | ICD-10-CM | POA: Diagnosis not present

## 2015-08-29 DIAGNOSIS — E7800 Pure hypercholesterolemia, unspecified: Secondary | ICD-10-CM | POA: Diagnosis not present

## 2015-08-29 DIAGNOSIS — M810 Age-related osteoporosis without current pathological fracture: Secondary | ICD-10-CM | POA: Diagnosis not present

## 2015-10-24 DIAGNOSIS — H2513 Age-related nuclear cataract, bilateral: Secondary | ICD-10-CM | POA: Diagnosis not present

## 2015-10-24 DIAGNOSIS — H02839 Dermatochalasis of unspecified eye, unspecified eyelid: Secondary | ICD-10-CM | POA: Diagnosis not present

## 2015-11-29 DIAGNOSIS — Z23 Encounter for immunization: Secondary | ICD-10-CM | POA: Diagnosis not present

## 2015-11-29 DIAGNOSIS — Z720 Tobacco use: Secondary | ICD-10-CM | POA: Diagnosis not present

## 2015-11-29 DIAGNOSIS — M810 Age-related osteoporosis without current pathological fracture: Secondary | ICD-10-CM | POA: Diagnosis not present

## 2015-11-29 DIAGNOSIS — K219 Gastro-esophageal reflux disease without esophagitis: Secondary | ICD-10-CM | POA: Diagnosis not present

## 2015-11-29 DIAGNOSIS — K589 Irritable bowel syndrome without diarrhea: Secondary | ICD-10-CM | POA: Diagnosis not present

## 2015-11-29 DIAGNOSIS — E7800 Pure hypercholesterolemia, unspecified: Secondary | ICD-10-CM | POA: Diagnosis not present

## 2015-11-29 DIAGNOSIS — E8881 Metabolic syndrome: Secondary | ICD-10-CM | POA: Diagnosis not present

## 2015-11-29 DIAGNOSIS — F419 Anxiety disorder, unspecified: Secondary | ICD-10-CM | POA: Diagnosis not present

## 2015-11-29 DIAGNOSIS — F329 Major depressive disorder, single episode, unspecified: Secondary | ICD-10-CM | POA: Diagnosis not present

## 2015-11-29 DIAGNOSIS — J449 Chronic obstructive pulmonary disease, unspecified: Secondary | ICD-10-CM | POA: Diagnosis not present

## 2015-11-29 DIAGNOSIS — I10 Essential (primary) hypertension: Secondary | ICD-10-CM | POA: Diagnosis not present

## 2015-11-29 DIAGNOSIS — J309 Allergic rhinitis, unspecified: Secondary | ICD-10-CM | POA: Diagnosis not present

## 2016-01-24 DIAGNOSIS — I10 Essential (primary) hypertension: Secondary | ICD-10-CM | POA: Diagnosis not present

## 2016-01-24 DIAGNOSIS — F419 Anxiety disorder, unspecified: Secondary | ICD-10-CM | POA: Diagnosis not present

## 2016-01-24 DIAGNOSIS — E663 Overweight: Secondary | ICD-10-CM | POA: Diagnosis not present

## 2016-01-24 DIAGNOSIS — M810 Age-related osteoporosis without current pathological fracture: Secondary | ICD-10-CM | POA: Diagnosis not present

## 2016-01-24 DIAGNOSIS — K589 Irritable bowel syndrome without diarrhea: Secondary | ICD-10-CM | POA: Diagnosis not present

## 2016-01-24 DIAGNOSIS — K219 Gastro-esophageal reflux disease without esophagitis: Secondary | ICD-10-CM | POA: Diagnosis not present

## 2016-01-24 DIAGNOSIS — Z720 Tobacco use: Secondary | ICD-10-CM | POA: Diagnosis not present

## 2016-01-24 DIAGNOSIS — F329 Major depressive disorder, single episode, unspecified: Secondary | ICD-10-CM | POA: Diagnosis not present

## 2016-01-24 DIAGNOSIS — E7800 Pure hypercholesterolemia, unspecified: Secondary | ICD-10-CM | POA: Diagnosis not present

## 2016-01-24 DIAGNOSIS — E8881 Metabolic syndrome: Secondary | ICD-10-CM | POA: Diagnosis not present

## 2016-01-24 DIAGNOSIS — J309 Allergic rhinitis, unspecified: Secondary | ICD-10-CM | POA: Diagnosis not present

## 2016-01-24 DIAGNOSIS — J449 Chronic obstructive pulmonary disease, unspecified: Secondary | ICD-10-CM | POA: Diagnosis not present

## 2016-03-30 DIAGNOSIS — E7800 Pure hypercholesterolemia, unspecified: Secondary | ICD-10-CM | POA: Diagnosis not present

## 2016-03-30 DIAGNOSIS — I10 Essential (primary) hypertension: Secondary | ICD-10-CM | POA: Diagnosis not present

## 2016-03-30 DIAGNOSIS — F419 Anxiety disorder, unspecified: Secondary | ICD-10-CM | POA: Diagnosis not present

## 2016-03-30 DIAGNOSIS — F329 Major depressive disorder, single episode, unspecified: Secondary | ICD-10-CM | POA: Diagnosis not present

## 2016-03-30 DIAGNOSIS — J449 Chronic obstructive pulmonary disease, unspecified: Secondary | ICD-10-CM | POA: Diagnosis not present

## 2016-03-30 DIAGNOSIS — J309 Allergic rhinitis, unspecified: Secondary | ICD-10-CM | POA: Diagnosis not present

## 2016-03-30 DIAGNOSIS — M810 Age-related osteoporosis without current pathological fracture: Secondary | ICD-10-CM | POA: Diagnosis not present

## 2016-03-30 DIAGNOSIS — Z9181 History of falling: Secondary | ICD-10-CM | POA: Diagnosis not present

## 2016-03-30 DIAGNOSIS — K589 Irritable bowel syndrome without diarrhea: Secondary | ICD-10-CM | POA: Diagnosis not present

## 2016-03-30 DIAGNOSIS — K219 Gastro-esophageal reflux disease without esophagitis: Secondary | ICD-10-CM | POA: Diagnosis not present

## 2016-03-30 DIAGNOSIS — E8881 Metabolic syndrome: Secondary | ICD-10-CM | POA: Diagnosis not present

## 2016-03-30 DIAGNOSIS — Z720 Tobacco use: Secondary | ICD-10-CM | POA: Diagnosis not present

## 2016-04-04 DIAGNOSIS — J4521 Mild intermittent asthma with (acute) exacerbation: Secondary | ICD-10-CM | POA: Diagnosis not present

## 2016-04-14 DIAGNOSIS — R1013 Epigastric pain: Secondary | ICD-10-CM | POA: Diagnosis not present

## 2016-04-20 DIAGNOSIS — F329 Major depressive disorder, single episode, unspecified: Secondary | ICD-10-CM | POA: Diagnosis not present

## 2016-04-20 DIAGNOSIS — F419 Anxiety disorder, unspecified: Secondary | ICD-10-CM | POA: Diagnosis not present

## 2016-04-20 DIAGNOSIS — J449 Chronic obstructive pulmonary disease, unspecified: Secondary | ICD-10-CM | POA: Diagnosis not present

## 2016-04-20 DIAGNOSIS — M810 Age-related osteoporosis without current pathological fracture: Secondary | ICD-10-CM | POA: Diagnosis not present

## 2016-04-20 DIAGNOSIS — K8020 Calculus of gallbladder without cholecystitis without obstruction: Secondary | ICD-10-CM | POA: Diagnosis not present

## 2016-04-20 DIAGNOSIS — E8881 Metabolic syndrome: Secondary | ICD-10-CM | POA: Diagnosis not present

## 2016-04-20 DIAGNOSIS — R1013 Epigastric pain: Secondary | ICD-10-CM | POA: Diagnosis not present

## 2016-04-20 DIAGNOSIS — Z9181 History of falling: Secondary | ICD-10-CM | POA: Diagnosis not present

## 2016-04-20 DIAGNOSIS — Z720 Tobacco use: Secondary | ICD-10-CM | POA: Diagnosis not present

## 2016-04-20 DIAGNOSIS — E7800 Pure hypercholesterolemia, unspecified: Secondary | ICD-10-CM | POA: Diagnosis not present

## 2016-04-20 DIAGNOSIS — M159 Polyosteoarthritis, unspecified: Secondary | ICD-10-CM | POA: Diagnosis not present

## 2016-04-20 DIAGNOSIS — J309 Allergic rhinitis, unspecified: Secondary | ICD-10-CM | POA: Diagnosis not present

## 2016-04-20 DIAGNOSIS — K8080 Other cholelithiasis without obstruction: Secondary | ICD-10-CM | POA: Diagnosis not present

## 2016-04-20 DIAGNOSIS — K219 Gastro-esophageal reflux disease without esophagitis: Secondary | ICD-10-CM | POA: Diagnosis not present

## 2016-04-20 DIAGNOSIS — I10 Essential (primary) hypertension: Secondary | ICD-10-CM | POA: Diagnosis not present

## 2016-04-30 DIAGNOSIS — K8010 Calculus of gallbladder with chronic cholecystitis without obstruction: Secondary | ICD-10-CM | POA: Diagnosis not present

## 2016-05-04 DIAGNOSIS — E7800 Pure hypercholesterolemia, unspecified: Secondary | ICD-10-CM | POA: Diagnosis not present

## 2016-05-04 DIAGNOSIS — E8881 Metabolic syndrome: Secondary | ICD-10-CM | POA: Diagnosis not present

## 2016-05-04 DIAGNOSIS — I10 Essential (primary) hypertension: Secondary | ICD-10-CM | POA: Diagnosis not present

## 2016-05-04 DIAGNOSIS — J449 Chronic obstructive pulmonary disease, unspecified: Secondary | ICD-10-CM | POA: Diagnosis not present

## 2016-05-04 DIAGNOSIS — J309 Allergic rhinitis, unspecified: Secondary | ICD-10-CM | POA: Diagnosis not present

## 2016-05-04 DIAGNOSIS — K219 Gastro-esophageal reflux disease without esophagitis: Secondary | ICD-10-CM | POA: Diagnosis not present

## 2016-05-04 DIAGNOSIS — F329 Major depressive disorder, single episode, unspecified: Secondary | ICD-10-CM | POA: Diagnosis not present

## 2016-05-04 DIAGNOSIS — Z720 Tobacco use: Secondary | ICD-10-CM | POA: Diagnosis not present

## 2016-05-04 DIAGNOSIS — M810 Age-related osteoporosis without current pathological fracture: Secondary | ICD-10-CM | POA: Diagnosis not present

## 2016-05-04 DIAGNOSIS — K589 Irritable bowel syndrome without diarrhea: Secondary | ICD-10-CM | POA: Diagnosis not present

## 2016-05-04 DIAGNOSIS — M159 Polyosteoarthritis, unspecified: Secondary | ICD-10-CM | POA: Diagnosis not present

## 2016-05-04 DIAGNOSIS — F419 Anxiety disorder, unspecified: Secondary | ICD-10-CM | POA: Diagnosis not present

## 2016-05-15 DIAGNOSIS — K8010 Calculus of gallbladder with chronic cholecystitis without obstruction: Secondary | ICD-10-CM | POA: Diagnosis not present

## 2016-05-15 DIAGNOSIS — J449 Chronic obstructive pulmonary disease, unspecified: Secondary | ICD-10-CM | POA: Diagnosis not present

## 2016-05-15 DIAGNOSIS — E7800 Pure hypercholesterolemia, unspecified: Secondary | ICD-10-CM | POA: Diagnosis not present

## 2016-05-15 DIAGNOSIS — I2510 Atherosclerotic heart disease of native coronary artery without angina pectoris: Secondary | ICD-10-CM | POA: Diagnosis not present

## 2016-05-15 DIAGNOSIS — Z0389 Encounter for observation for other suspected diseases and conditions ruled out: Secondary | ICD-10-CM | POA: Diagnosis not present

## 2016-05-15 DIAGNOSIS — I10 Essential (primary) hypertension: Secondary | ICD-10-CM | POA: Diagnosis not present

## 2016-05-15 DIAGNOSIS — K660 Peritoneal adhesions (postprocedural) (postinfection): Secondary | ICD-10-CM | POA: Diagnosis not present

## 2016-05-15 DIAGNOSIS — Z7982 Long term (current) use of aspirin: Secondary | ICD-10-CM | POA: Diagnosis not present

## 2016-05-15 DIAGNOSIS — F17210 Nicotine dependence, cigarettes, uncomplicated: Secondary | ICD-10-CM | POA: Diagnosis not present

## 2016-05-15 DIAGNOSIS — Z01810 Encounter for preprocedural cardiovascular examination: Secondary | ICD-10-CM | POA: Diagnosis not present

## 2016-05-15 DIAGNOSIS — R160 Hepatomegaly, not elsewhere classified: Secondary | ICD-10-CM | POA: Diagnosis not present

## 2016-05-15 DIAGNOSIS — Z79899 Other long term (current) drug therapy: Secondary | ICD-10-CM | POA: Diagnosis not present

## 2016-05-15 DIAGNOSIS — K580 Irritable bowel syndrome with diarrhea: Secondary | ICD-10-CM | POA: Diagnosis not present

## 2016-07-28 DIAGNOSIS — K219 Gastro-esophageal reflux disease without esophagitis: Secondary | ICD-10-CM | POA: Diagnosis not present

## 2016-07-28 DIAGNOSIS — I10 Essential (primary) hypertension: Secondary | ICD-10-CM | POA: Diagnosis not present

## 2016-07-28 DIAGNOSIS — E8881 Metabolic syndrome: Secondary | ICD-10-CM | POA: Diagnosis not present

## 2016-07-28 DIAGNOSIS — J449 Chronic obstructive pulmonary disease, unspecified: Secondary | ICD-10-CM | POA: Diagnosis not present

## 2016-07-28 DIAGNOSIS — M810 Age-related osteoporosis without current pathological fracture: Secondary | ICD-10-CM | POA: Diagnosis not present

## 2016-07-28 DIAGNOSIS — K589 Irritable bowel syndrome without diarrhea: Secondary | ICD-10-CM | POA: Diagnosis not present

## 2016-07-28 DIAGNOSIS — J209 Acute bronchitis, unspecified: Secondary | ICD-10-CM | POA: Diagnosis not present

## 2016-07-28 DIAGNOSIS — J309 Allergic rhinitis, unspecified: Secondary | ICD-10-CM | POA: Diagnosis not present

## 2016-07-28 DIAGNOSIS — F419 Anxiety disorder, unspecified: Secondary | ICD-10-CM | POA: Diagnosis not present

## 2016-07-28 DIAGNOSIS — E7800 Pure hypercholesterolemia, unspecified: Secondary | ICD-10-CM | POA: Diagnosis not present

## 2016-07-28 DIAGNOSIS — F329 Major depressive disorder, single episode, unspecified: Secondary | ICD-10-CM | POA: Diagnosis not present

## 2016-07-28 DIAGNOSIS — M159 Polyosteoarthritis, unspecified: Secondary | ICD-10-CM | POA: Diagnosis not present

## 2016-07-28 DIAGNOSIS — Z720 Tobacco use: Secondary | ICD-10-CM | POA: Diagnosis not present

## 2016-11-20 DIAGNOSIS — Z23 Encounter for immunization: Secondary | ICD-10-CM | POA: Diagnosis not present

## 2016-11-27 DIAGNOSIS — K219 Gastro-esophageal reflux disease without esophagitis: Secondary | ICD-10-CM | POA: Diagnosis not present

## 2016-11-27 DIAGNOSIS — E7800 Pure hypercholesterolemia, unspecified: Secondary | ICD-10-CM | POA: Diagnosis not present

## 2016-11-27 DIAGNOSIS — F419 Anxiety disorder, unspecified: Secondary | ICD-10-CM | POA: Diagnosis not present

## 2016-11-27 DIAGNOSIS — J309 Allergic rhinitis, unspecified: Secondary | ICD-10-CM | POA: Diagnosis not present

## 2016-11-27 DIAGNOSIS — I10 Essential (primary) hypertension: Secondary | ICD-10-CM | POA: Diagnosis not present

## 2016-11-27 DIAGNOSIS — J449 Chronic obstructive pulmonary disease, unspecified: Secondary | ICD-10-CM | POA: Diagnosis not present

## 2016-11-27 DIAGNOSIS — K589 Irritable bowel syndrome without diarrhea: Secondary | ICD-10-CM | POA: Diagnosis not present

## 2016-11-27 DIAGNOSIS — M810 Age-related osteoporosis without current pathological fracture: Secondary | ICD-10-CM | POA: Diagnosis not present

## 2016-11-27 DIAGNOSIS — E8881 Metabolic syndrome: Secondary | ICD-10-CM | POA: Diagnosis not present

## 2016-11-27 DIAGNOSIS — F329 Major depressive disorder, single episode, unspecified: Secondary | ICD-10-CM | POA: Diagnosis not present

## 2016-12-21 DIAGNOSIS — H25813 Combined forms of age-related cataract, bilateral: Secondary | ICD-10-CM | POA: Diagnosis not present

## 2016-12-21 DIAGNOSIS — H52223 Regular astigmatism, bilateral: Secondary | ICD-10-CM | POA: Diagnosis not present

## 2016-12-21 DIAGNOSIS — H35373 Puckering of macula, bilateral: Secondary | ICD-10-CM | POA: Diagnosis not present

## 2016-12-21 DIAGNOSIS — H524 Presbyopia: Secondary | ICD-10-CM | POA: Diagnosis not present

## 2016-12-21 DIAGNOSIS — H5203 Hypermetropia, bilateral: Secondary | ICD-10-CM | POA: Diagnosis not present

## 2016-12-21 DIAGNOSIS — I10 Essential (primary) hypertension: Secondary | ICD-10-CM | POA: Diagnosis not present

## 2016-12-28 DIAGNOSIS — J208 Acute bronchitis due to other specified organisms: Secondary | ICD-10-CM | POA: Diagnosis not present

## 2016-12-28 DIAGNOSIS — E8881 Metabolic syndrome: Secondary | ICD-10-CM | POA: Diagnosis not present

## 2016-12-28 DIAGNOSIS — K219 Gastro-esophageal reflux disease without esophagitis: Secondary | ICD-10-CM | POA: Diagnosis not present

## 2016-12-28 DIAGNOSIS — M159 Polyosteoarthritis, unspecified: Secondary | ICD-10-CM | POA: Diagnosis not present

## 2016-12-28 DIAGNOSIS — F329 Major depressive disorder, single episode, unspecified: Secondary | ICD-10-CM | POA: Diagnosis not present

## 2016-12-28 DIAGNOSIS — M810 Age-related osteoporosis without current pathological fracture: Secondary | ICD-10-CM | POA: Diagnosis not present

## 2016-12-28 DIAGNOSIS — F419 Anxiety disorder, unspecified: Secondary | ICD-10-CM | POA: Diagnosis not present

## 2016-12-28 DIAGNOSIS — J449 Chronic obstructive pulmonary disease, unspecified: Secondary | ICD-10-CM | POA: Diagnosis not present

## 2016-12-28 DIAGNOSIS — E7800 Pure hypercholesterolemia, unspecified: Secondary | ICD-10-CM | POA: Diagnosis not present

## 2016-12-28 DIAGNOSIS — Z720 Tobacco use: Secondary | ICD-10-CM | POA: Diagnosis not present

## 2016-12-28 DIAGNOSIS — K589 Irritable bowel syndrome without diarrhea: Secondary | ICD-10-CM | POA: Diagnosis not present

## 2016-12-28 DIAGNOSIS — J309 Allergic rhinitis, unspecified: Secondary | ICD-10-CM | POA: Diagnosis not present

## 2017-03-30 DIAGNOSIS — E8881 Metabolic syndrome: Secondary | ICD-10-CM | POA: Diagnosis not present

## 2017-03-30 DIAGNOSIS — I10 Essential (primary) hypertension: Secondary | ICD-10-CM | POA: Diagnosis not present

## 2017-03-30 DIAGNOSIS — M810 Age-related osteoporosis without current pathological fracture: Secondary | ICD-10-CM | POA: Diagnosis not present

## 2017-03-30 DIAGNOSIS — F329 Major depressive disorder, single episode, unspecified: Secondary | ICD-10-CM | POA: Diagnosis not present

## 2017-03-30 DIAGNOSIS — K589 Irritable bowel syndrome without diarrhea: Secondary | ICD-10-CM | POA: Diagnosis not present

## 2017-03-30 DIAGNOSIS — J449 Chronic obstructive pulmonary disease, unspecified: Secondary | ICD-10-CM | POA: Diagnosis not present

## 2017-03-30 DIAGNOSIS — Z0000 Encounter for general adult medical examination without abnormal findings: Secondary | ICD-10-CM | POA: Diagnosis not present

## 2017-03-30 DIAGNOSIS — F419 Anxiety disorder, unspecified: Secondary | ICD-10-CM | POA: Diagnosis not present

## 2017-03-30 DIAGNOSIS — M159 Polyosteoarthritis, unspecified: Secondary | ICD-10-CM | POA: Diagnosis not present

## 2017-03-30 DIAGNOSIS — Z9181 History of falling: Secondary | ICD-10-CM | POA: Diagnosis not present

## 2017-03-30 DIAGNOSIS — E7800 Pure hypercholesterolemia, unspecified: Secondary | ICD-10-CM | POA: Diagnosis not present

## 2017-03-30 DIAGNOSIS — E785 Hyperlipidemia, unspecified: Secondary | ICD-10-CM | POA: Diagnosis not present

## 2017-03-30 DIAGNOSIS — J309 Allergic rhinitis, unspecified: Secondary | ICD-10-CM | POA: Diagnosis not present

## 2017-07-28 DIAGNOSIS — E8881 Metabolic syndrome: Secondary | ICD-10-CM | POA: Diagnosis not present

## 2017-07-28 DIAGNOSIS — J449 Chronic obstructive pulmonary disease, unspecified: Secondary | ICD-10-CM | POA: Diagnosis not present

## 2017-07-28 DIAGNOSIS — E7800 Pure hypercholesterolemia, unspecified: Secondary | ICD-10-CM | POA: Diagnosis not present

## 2017-07-28 DIAGNOSIS — K589 Irritable bowel syndrome without diarrhea: Secondary | ICD-10-CM | POA: Diagnosis not present

## 2017-07-28 DIAGNOSIS — I10 Essential (primary) hypertension: Secondary | ICD-10-CM | POA: Diagnosis not present

## 2017-07-28 DIAGNOSIS — M810 Age-related osteoporosis without current pathological fracture: Secondary | ICD-10-CM | POA: Diagnosis not present

## 2017-11-29 DIAGNOSIS — E8881 Metabolic syndrome: Secondary | ICD-10-CM | POA: Diagnosis not present

## 2017-11-29 DIAGNOSIS — Z23 Encounter for immunization: Secondary | ICD-10-CM | POA: Diagnosis not present

## 2017-11-29 DIAGNOSIS — K589 Irritable bowel syndrome without diarrhea: Secondary | ICD-10-CM | POA: Diagnosis not present

## 2017-11-29 DIAGNOSIS — Z1339 Encounter for screening examination for other mental health and behavioral disorders: Secondary | ICD-10-CM | POA: Diagnosis not present

## 2017-11-29 DIAGNOSIS — I10 Essential (primary) hypertension: Secondary | ICD-10-CM | POA: Diagnosis not present

## 2017-11-29 DIAGNOSIS — M810 Age-related osteoporosis without current pathological fracture: Secondary | ICD-10-CM | POA: Diagnosis not present

## 2017-11-29 DIAGNOSIS — J449 Chronic obstructive pulmonary disease, unspecified: Secondary | ICD-10-CM | POA: Diagnosis not present

## 2018-02-25 DIAGNOSIS — E8881 Metabolic syndrome: Secondary | ICD-10-CM | POA: Diagnosis not present

## 2018-02-25 DIAGNOSIS — J209 Acute bronchitis, unspecified: Secondary | ICD-10-CM | POA: Diagnosis not present

## 2018-02-25 DIAGNOSIS — J449 Chronic obstructive pulmonary disease, unspecified: Secondary | ICD-10-CM | POA: Diagnosis not present

## 2018-02-25 DIAGNOSIS — K4190 Unilateral femoral hernia, without obstruction or gangrene, not specified as recurrent: Secondary | ICD-10-CM | POA: Diagnosis not present

## 2018-03-02 DIAGNOSIS — K4090 Unilateral inguinal hernia, without obstruction or gangrene, not specified as recurrent: Secondary | ICD-10-CM | POA: Diagnosis not present

## 2018-03-04 DIAGNOSIS — R1010 Upper abdominal pain, unspecified: Secondary | ICD-10-CM | POA: Diagnosis not present

## 2018-03-14 DIAGNOSIS — I10 Essential (primary) hypertension: Secondary | ICD-10-CM | POA: Diagnosis not present

## 2018-03-14 DIAGNOSIS — G8918 Other acute postprocedural pain: Secondary | ICD-10-CM | POA: Diagnosis not present

## 2018-03-14 DIAGNOSIS — F329 Major depressive disorder, single episode, unspecified: Secondary | ICD-10-CM | POA: Diagnosis not present

## 2018-03-14 DIAGNOSIS — Z79899 Other long term (current) drug therapy: Secondary | ICD-10-CM | POA: Diagnosis not present

## 2018-03-14 DIAGNOSIS — M1990 Unspecified osteoarthritis, unspecified site: Secondary | ICD-10-CM | POA: Diagnosis not present

## 2018-03-14 DIAGNOSIS — Z7982 Long term (current) use of aspirin: Secondary | ICD-10-CM | POA: Diagnosis not present

## 2018-03-14 DIAGNOSIS — Z7951 Long term (current) use of inhaled steroids: Secondary | ICD-10-CM | POA: Diagnosis not present

## 2018-03-14 DIAGNOSIS — K4090 Unilateral inguinal hernia, without obstruction or gangrene, not specified as recurrent: Secondary | ICD-10-CM | POA: Diagnosis not present

## 2018-03-14 DIAGNOSIS — F17200 Nicotine dependence, unspecified, uncomplicated: Secondary | ICD-10-CM | POA: Diagnosis not present

## 2018-03-14 DIAGNOSIS — Z791 Long term (current) use of non-steroidal anti-inflammatories (NSAID): Secondary | ICD-10-CM | POA: Diagnosis not present

## 2018-03-14 DIAGNOSIS — K648 Other hemorrhoids: Secondary | ICD-10-CM | POA: Diagnosis not present

## 2018-03-14 DIAGNOSIS — J449 Chronic obstructive pulmonary disease, unspecified: Secondary | ICD-10-CM | POA: Diagnosis not present

## 2018-04-06 DIAGNOSIS — J449 Chronic obstructive pulmonary disease, unspecified: Secondary | ICD-10-CM | POA: Diagnosis not present

## 2018-04-06 DIAGNOSIS — M810 Age-related osteoporosis without current pathological fracture: Secondary | ICD-10-CM | POA: Diagnosis not present

## 2018-04-06 DIAGNOSIS — Z23 Encounter for immunization: Secondary | ICD-10-CM | POA: Diagnosis not present

## 2018-04-06 DIAGNOSIS — Z1211 Encounter for screening for malignant neoplasm of colon: Secondary | ICD-10-CM | POA: Diagnosis not present

## 2018-04-06 DIAGNOSIS — R5383 Other fatigue: Secondary | ICD-10-CM | POA: Diagnosis not present

## 2018-04-06 DIAGNOSIS — R5381 Other malaise: Secondary | ICD-10-CM | POA: Diagnosis not present

## 2018-04-06 DIAGNOSIS — E559 Vitamin D deficiency, unspecified: Secondary | ICD-10-CM | POA: Diagnosis not present

## 2018-04-06 DIAGNOSIS — E782 Mixed hyperlipidemia: Secondary | ICD-10-CM | POA: Diagnosis not present

## 2018-04-06 DIAGNOSIS — I10 Essential (primary) hypertension: Secondary | ICD-10-CM | POA: Diagnosis not present

## 2018-04-06 DIAGNOSIS — R82998 Other abnormal findings in urine: Secondary | ICD-10-CM | POA: Diagnosis not present

## 2018-04-06 DIAGNOSIS — M129 Arthropathy, unspecified: Secondary | ICD-10-CM | POA: Diagnosis not present

## 2018-04-06 DIAGNOSIS — F3342 Major depressive disorder, recurrent, in full remission: Secondary | ICD-10-CM | POA: Diagnosis not present

## 2018-09-06 DIAGNOSIS — H5203 Hypermetropia, bilateral: Secondary | ICD-10-CM | POA: Diagnosis not present

## 2018-09-06 DIAGNOSIS — H35372 Puckering of macula, left eye: Secondary | ICD-10-CM | POA: Diagnosis not present

## 2018-09-06 DIAGNOSIS — H25813 Combined forms of age-related cataract, bilateral: Secondary | ICD-10-CM | POA: Diagnosis not present

## 2018-09-06 DIAGNOSIS — H35373 Puckering of macula, bilateral: Secondary | ICD-10-CM | POA: Diagnosis not present

## 2018-09-06 DIAGNOSIS — H524 Presbyopia: Secondary | ICD-10-CM | POA: Diagnosis not present

## 2018-09-06 DIAGNOSIS — H52223 Regular astigmatism, bilateral: Secondary | ICD-10-CM | POA: Diagnosis not present

## 2018-09-21 DIAGNOSIS — E559 Vitamin D deficiency, unspecified: Secondary | ICD-10-CM | POA: Diagnosis not present

## 2018-09-26 ENCOUNTER — Other Ambulatory Visit: Payer: Self-pay

## 2018-10-05 DIAGNOSIS — F3342 Major depressive disorder, recurrent, in full remission: Secondary | ICD-10-CM | POA: Diagnosis not present

## 2018-10-05 DIAGNOSIS — E782 Mixed hyperlipidemia: Secondary | ICD-10-CM | POA: Diagnosis not present

## 2018-10-05 DIAGNOSIS — J449 Chronic obstructive pulmonary disease, unspecified: Secondary | ICD-10-CM | POA: Diagnosis not present

## 2018-10-05 DIAGNOSIS — B372 Candidiasis of skin and nail: Secondary | ICD-10-CM | POA: Diagnosis not present

## 2018-10-05 DIAGNOSIS — I10 Essential (primary) hypertension: Secondary | ICD-10-CM | POA: Diagnosis not present

## 2018-10-05 DIAGNOSIS — Z0000 Encounter for general adult medical examination without abnormal findings: Secondary | ICD-10-CM | POA: Diagnosis not present

## 2018-12-27 DIAGNOSIS — Z23 Encounter for immunization: Secondary | ICD-10-CM | POA: Diagnosis not present

## 2019-04-07 DIAGNOSIS — I10 Essential (primary) hypertension: Secondary | ICD-10-CM | POA: Diagnosis not present

## 2019-04-07 DIAGNOSIS — F3342 Major depressive disorder, recurrent, in full remission: Secondary | ICD-10-CM | POA: Diagnosis not present

## 2019-04-07 DIAGNOSIS — J449 Chronic obstructive pulmonary disease, unspecified: Secondary | ICD-10-CM | POA: Diagnosis not present

## 2019-04-07 DIAGNOSIS — M810 Age-related osteoporosis without current pathological fracture: Secondary | ICD-10-CM | POA: Diagnosis not present

## 2019-04-07 DIAGNOSIS — D171 Benign lipomatous neoplasm of skin and subcutaneous tissue of trunk: Secondary | ICD-10-CM | POA: Insufficient documentation

## 2019-04-07 DIAGNOSIS — L723 Sebaceous cyst: Secondary | ICD-10-CM | POA: Insufficient documentation

## 2019-05-01 DIAGNOSIS — R0602 Shortness of breath: Secondary | ICD-10-CM | POA: Diagnosis not present

## 2019-05-01 DIAGNOSIS — J441 Chronic obstructive pulmonary disease with (acute) exacerbation: Secondary | ICD-10-CM | POA: Diagnosis not present

## 2019-05-01 DIAGNOSIS — R05 Cough: Secondary | ICD-10-CM | POA: Diagnosis not present

## 2019-05-12 ENCOUNTER — Emergency Department (HOSPITAL_COMMUNITY): Payer: Medicare Other

## 2019-05-12 ENCOUNTER — Inpatient Hospital Stay (HOSPITAL_COMMUNITY)
Admission: EM | Admit: 2019-05-12 | Discharge: 2019-05-14 | DRG: 194 | Disposition: A | Discharge status: Home / Self Care | Payer: Medicare Other | Attending: Student | Admitting: Student

## 2019-05-12 ENCOUNTER — Encounter (HOSPITAL_COMMUNITY): Payer: Self-pay | Admitting: *Deleted

## 2019-05-12 ENCOUNTER — Other Ambulatory Visit: Payer: Self-pay

## 2019-05-12 DIAGNOSIS — I248 Other forms of acute ischemic heart disease: Secondary | ICD-10-CM | POA: Diagnosis present

## 2019-05-12 DIAGNOSIS — Z7982 Long term (current) use of aspirin: Secondary | ICD-10-CM

## 2019-05-12 DIAGNOSIS — J4520 Mild intermittent asthma, uncomplicated: Secondary | ICD-10-CM | POA: Diagnosis present

## 2019-05-12 DIAGNOSIS — E871 Hypo-osmolality and hyponatremia: Secondary | ICD-10-CM | POA: Diagnosis present

## 2019-05-12 DIAGNOSIS — F419 Anxiety disorder, unspecified: Secondary | ICD-10-CM | POA: Diagnosis present

## 2019-05-12 DIAGNOSIS — I4892 Unspecified atrial flutter: Secondary | ICD-10-CM | POA: Diagnosis not present

## 2019-05-12 DIAGNOSIS — Z87891 Personal history of nicotine dependence: Secondary | ICD-10-CM

## 2019-05-12 DIAGNOSIS — I472 Ventricular tachycardia: Secondary | ICD-10-CM | POA: Diagnosis not present

## 2019-05-12 DIAGNOSIS — Z20822 Contact with and (suspected) exposure to covid-19: Secondary | ICD-10-CM | POA: Diagnosis present

## 2019-05-12 DIAGNOSIS — K7460 Unspecified cirrhosis of liver: Secondary | ICD-10-CM | POA: Diagnosis present

## 2019-05-12 DIAGNOSIS — R509 Fever, unspecified: Secondary | ICD-10-CM | POA: Diagnosis not present

## 2019-05-12 DIAGNOSIS — R7989 Other specified abnormal findings of blood chemistry: Secondary | ICD-10-CM | POA: Diagnosis present

## 2019-05-12 DIAGNOSIS — J189 Pneumonia, unspecified organism: Principal | ICD-10-CM | POA: Diagnosis present

## 2019-05-12 DIAGNOSIS — R0902 Hypoxemia: Secondary | ICD-10-CM | POA: Diagnosis present

## 2019-05-12 DIAGNOSIS — R05 Cough: Secondary | ICD-10-CM | POA: Diagnosis not present

## 2019-05-12 DIAGNOSIS — Z7951 Long term (current) use of inhaled steroids: Secondary | ICD-10-CM

## 2019-05-12 DIAGNOSIS — Z9049 Acquired absence of other specified parts of digestive tract: Secondary | ICD-10-CM

## 2019-05-12 DIAGNOSIS — I10 Essential (primary) hypertension: Secondary | ICD-10-CM | POA: Diagnosis present

## 2019-05-12 DIAGNOSIS — R000 Tachycardia, unspecified: Secondary | ICD-10-CM | POA: Diagnosis not present

## 2019-05-12 DIAGNOSIS — R778 Other specified abnormalities of plasma proteins: Secondary | ICD-10-CM | POA: Diagnosis present

## 2019-05-12 DIAGNOSIS — E876 Hypokalemia: Secondary | ICD-10-CM | POA: Diagnosis present

## 2019-05-12 DIAGNOSIS — I4891 Unspecified atrial fibrillation: Secondary | ICD-10-CM | POA: Diagnosis present

## 2019-05-12 DIAGNOSIS — R9431 Abnormal electrocardiogram [ECG] [EKG]: Secondary | ICD-10-CM | POA: Diagnosis present

## 2019-05-12 DIAGNOSIS — D72829 Elevated white blood cell count, unspecified: Secondary | ICD-10-CM | POA: Diagnosis present

## 2019-05-12 DIAGNOSIS — I4729 Other ventricular tachycardia: Secondary | ICD-10-CM

## 2019-05-12 DIAGNOSIS — Z79899 Other long term (current) drug therapy: Secondary | ICD-10-CM

## 2019-05-12 HISTORY — DX: Anxiety disorder, unspecified: F41.9

## 2019-05-12 HISTORY — DX: Essential (primary) hypertension: I10

## 2019-05-12 LAB — CBC
HCT: 43 % (ref 36.0–46.0)
Hemoglobin: 14.7 g/dL (ref 12.0–15.0)
MCH: 31.5 pg (ref 26.0–34.0)
MCHC: 34.2 g/dL (ref 30.0–36.0)
MCV: 92.1 fL (ref 80.0–100.0)
Platelets: 171 10*3/uL (ref 150–400)
RBC: 4.67 MIL/uL (ref 3.87–5.11)
RDW: 13.4 % (ref 11.5–15.5)
WBC: 23.9 10*3/uL — ABNORMAL HIGH (ref 4.0–10.5)
nRBC: 0 % (ref 0.0–0.2)

## 2019-05-12 LAB — BASIC METABOLIC PANEL
Anion gap: 12 (ref 5–15)
BUN: 11 mg/dL (ref 8–23)
CO2: 25 mmol/L (ref 22–32)
Calcium: 8.7 mg/dL — ABNORMAL LOW (ref 8.9–10.3)
Chloride: 93 mmol/L — ABNORMAL LOW (ref 98–111)
Creatinine, Ser: 0.86 mg/dL (ref 0.44–1.00)
GFR calc Af Amer: >60 mL/min (ref >60)
GFR calc non Af Amer: >60 mL/min (ref >60)
Glucose, Bld: 128 mg/dL — ABNORMAL HIGH (ref 70–99)
Potassium: 3.3 mmol/L — ABNORMAL LOW (ref 3.5–5.1)
Sodium: 130 mmol/L — ABNORMAL LOW (ref 135–145)

## 2019-05-12 NOTE — ED Triage Notes (Signed)
Fever and cough. Saw her provider about 1.5 weeks and was given abx and prednisone, symptoms still ongoing.

## 2019-05-13 ENCOUNTER — Inpatient Hospital Stay (HOSPITAL_COMMUNITY): Payer: Medicare Other

## 2019-05-13 ENCOUNTER — Encounter (HOSPITAL_COMMUNITY): Payer: Self-pay | Admitting: Internal Medicine

## 2019-05-13 DIAGNOSIS — I4729 Other ventricular tachycardia: Secondary | ICD-10-CM

## 2019-05-13 DIAGNOSIS — F419 Anxiety disorder, unspecified: Secondary | ICD-10-CM | POA: Diagnosis present

## 2019-05-13 DIAGNOSIS — R0902 Hypoxemia: Secondary | ICD-10-CM | POA: Diagnosis present

## 2019-05-13 DIAGNOSIS — I4891 Unspecified atrial fibrillation: Secondary | ICD-10-CM | POA: Diagnosis present

## 2019-05-13 DIAGNOSIS — Z7982 Long term (current) use of aspirin: Secondary | ICD-10-CM | POA: Diagnosis not present

## 2019-05-13 DIAGNOSIS — R509 Fever, unspecified: Secondary | ICD-10-CM | POA: Diagnosis not present

## 2019-05-13 DIAGNOSIS — I10 Essential (primary) hypertension: Secondary | ICD-10-CM | POA: Diagnosis present

## 2019-05-13 DIAGNOSIS — K7460 Unspecified cirrhosis of liver: Secondary | ICD-10-CM | POA: Diagnosis present

## 2019-05-13 DIAGNOSIS — R9431 Abnormal electrocardiogram [ECG] [EKG]: Secondary | ICD-10-CM | POA: Diagnosis present

## 2019-05-13 DIAGNOSIS — J189 Pneumonia, unspecified organism: Principal | ICD-10-CM

## 2019-05-13 DIAGNOSIS — I472 Ventricular tachycardia: Secondary | ICD-10-CM

## 2019-05-13 DIAGNOSIS — I248 Other forms of acute ischemic heart disease: Secondary | ICD-10-CM | POA: Diagnosis present

## 2019-05-13 DIAGNOSIS — R7989 Other specified abnormal findings of blood chemistry: Secondary | ICD-10-CM

## 2019-05-13 DIAGNOSIS — K7469 Other cirrhosis of liver: Secondary | ICD-10-CM | POA: Diagnosis not present

## 2019-05-13 DIAGNOSIS — R778 Other specified abnormalities of plasma proteins: Secondary | ICD-10-CM

## 2019-05-13 DIAGNOSIS — Z79899 Other long term (current) drug therapy: Secondary | ICD-10-CM | POA: Diagnosis not present

## 2019-05-13 DIAGNOSIS — J4520 Mild intermittent asthma, uncomplicated: Secondary | ICD-10-CM | POA: Diagnosis present

## 2019-05-13 DIAGNOSIS — R0602 Shortness of breath: Secondary | ICD-10-CM | POA: Diagnosis not present

## 2019-05-13 DIAGNOSIS — I498 Other specified cardiac arrhythmias: Secondary | ICD-10-CM | POA: Diagnosis not present

## 2019-05-13 DIAGNOSIS — Z9049 Acquired absence of other specified parts of digestive tract: Secondary | ICD-10-CM | POA: Diagnosis not present

## 2019-05-13 DIAGNOSIS — I4892 Unspecified atrial flutter: Secondary | ICD-10-CM | POA: Diagnosis present

## 2019-05-13 DIAGNOSIS — D72829 Elevated white blood cell count, unspecified: Secondary | ICD-10-CM | POA: Diagnosis present

## 2019-05-13 DIAGNOSIS — I361 Nonrheumatic tricuspid (valve) insufficiency: Secondary | ICD-10-CM | POA: Diagnosis not present

## 2019-05-13 DIAGNOSIS — E871 Hypo-osmolality and hyponatremia: Secondary | ICD-10-CM | POA: Diagnosis present

## 2019-05-13 DIAGNOSIS — E876 Hypokalemia: Secondary | ICD-10-CM | POA: Diagnosis present

## 2019-05-13 DIAGNOSIS — Z87891 Personal history of nicotine dependence: Secondary | ICD-10-CM | POA: Diagnosis not present

## 2019-05-13 DIAGNOSIS — Z7951 Long term (current) use of inhaled steroids: Secondary | ICD-10-CM | POA: Diagnosis not present

## 2019-05-13 DIAGNOSIS — Z20822 Contact with and (suspected) exposure to covid-19: Secondary | ICD-10-CM | POA: Diagnosis present

## 2019-05-13 HISTORY — DX: Other specified abnormal findings of blood chemistry: R79.89

## 2019-05-13 HISTORY — DX: Essential (primary) hypertension: I10

## 2019-05-13 HISTORY — DX: Other ventricular tachycardia: I47.29

## 2019-05-13 HISTORY — DX: Hypoxemia: R09.02

## 2019-05-13 HISTORY — DX: Elevated white blood cell count, unspecified: D72.829

## 2019-05-13 HISTORY — DX: Pneumonia, unspecified organism: J18.9

## 2019-05-13 HISTORY — DX: Abnormal electrocardiogram (ECG) (EKG): R94.31

## 2019-05-13 LAB — BASIC METABOLIC PANEL
Anion gap: 13 (ref 5–15)
BUN: 15 mg/dL (ref 8–23)
CO2: 24 mmol/L (ref 22–32)
Calcium: 8.1 mg/dL — ABNORMAL LOW (ref 8.9–10.3)
Chloride: 90 mmol/L — ABNORMAL LOW (ref 98–111)
Creatinine, Ser: 0.83 mg/dL (ref 0.44–1.00)
GFR calc Af Amer: >60 mL/min (ref >60)
GFR calc non Af Amer: >60 mL/min (ref >60)
Glucose, Bld: 162 mg/dL — ABNORMAL HIGH (ref 70–99)
Potassium: 3.3 mmol/L — ABNORMAL LOW (ref 3.5–5.1)
Sodium: 127 mmol/L — ABNORMAL LOW (ref 135–145)

## 2019-05-13 LAB — PROCALCITONIN: Procalcitonin: 0.24 ng/mL

## 2019-05-13 LAB — TROPONIN I (HIGH SENSITIVITY)
Troponin I (High Sensitivity): 23 ng/L — ABNORMAL HIGH (ref <18)
Troponin I (High Sensitivity): 19 ng/L — ABNORMAL HIGH (ref <18)

## 2019-05-13 LAB — POC SARS CORONAVIRUS 2 AG -  ED: SARS Coronavirus 2 Ag: NEGATIVE

## 2019-05-13 LAB — HIV ANTIBODY (ROUTINE TESTING W REFLEX): HIV Screen 4th Generation wRfx: NONREACTIVE

## 2019-05-13 LAB — SARS CORONAVIRUS 2 (TAT 6-24 HRS): SARS Coronavirus 2: NEGATIVE

## 2019-05-13 LAB — MAGNESIUM: Magnesium: 1.6 mg/dL — ABNORMAL LOW (ref 1.7–2.4)

## 2019-05-13 LAB — CBG MONITORING, ED: Glucose-Capillary: 126 mg/dL — ABNORMAL HIGH (ref 70–99)

## 2019-05-13 LAB — LACTIC ACID, PLASMA: Lactic Acid, Venous: 1.5 mmol/L (ref 0.5–1.9)

## 2019-05-13 MED ORDER — IOHEXOL 350 MG/ML SOLN
75.0000 mL | Freq: Once | INTRAVENOUS | Status: AC | PRN
  Administered 2019-05-13: 75 mL via INTRAVENOUS

## 2019-05-13 MED ORDER — VANCOMYCIN HCL IN DEXTROSE 1-5 GM/200ML-% IV SOLN
1000.0000 mg | Freq: Once | INTRAVENOUS | Status: AC
  Administered 2019-05-13: 1000 mg via INTRAVENOUS
  Filled 2019-05-13: qty 200

## 2019-05-13 MED ORDER — ENOXAPARIN SODIUM 40 MG/0.4ML ~~LOC~~ SOLN
40.0000 mg | SUBCUTANEOUS | Status: DC
  Administered 2019-05-13: 40 mg via SUBCUTANEOUS
  Filled 2019-05-13: qty 0.4

## 2019-05-13 MED ORDER — PAROXETINE HCL 20 MG PO TABS
20.0000 mg | ORAL_TABLET | Freq: Every day | ORAL | Status: DC
  Administered 2019-05-13 – 2019-05-14 (×2): 20 mg via ORAL
  Filled 2019-05-13 (×2): qty 1

## 2019-05-13 MED ORDER — ATENOLOL 25 MG PO TABS
50.0000 mg | ORAL_TABLET | Freq: Every day | ORAL | Status: DC
  Administered 2019-05-13 – 2019-05-14 (×2): 50 mg via ORAL
  Filled 2019-05-13 (×3): qty 2

## 2019-05-13 MED ORDER — DIPHENOXYLATE-ATROPINE 2.5-0.025 MG PO TABS
1.0000 | ORAL_TABLET | Freq: Four times a day (QID) | ORAL | Status: DC | PRN
  Administered 2019-05-13 – 2019-05-14 (×2): 1 via ORAL
  Filled 2019-05-13 (×2): qty 1

## 2019-05-13 MED ORDER — ALBUTEROL SULFATE (2.5 MG/3ML) 0.083% IN NEBU: 2.5000 mg | INHALATION_SOLUTION | RESPIRATORY_TRACT | Status: DC | PRN

## 2019-05-13 MED ORDER — SODIUM CHLORIDE 0.9 % IV SOLN
100.0000 mg | Freq: Two times a day (BID) | INTRAVENOUS | Status: DC
  Administered 2019-05-13 – 2019-05-14 (×3): 100 mg via INTRAVENOUS
  Filled 2019-05-13 (×5): qty 100

## 2019-05-13 MED ORDER — ASPIRIN EC 81 MG PO TBEC
81.0000 mg | DELAYED_RELEASE_TABLET | Freq: Every day | ORAL | Status: DC
  Administered 2019-05-13 – 2019-05-14 (×2): 81 mg via ORAL
  Filled 2019-05-13 (×2): qty 1

## 2019-05-13 MED ORDER — SODIUM CHLORIDE 0.9 % IV SOLN
2.0000 g | INTRAVENOUS | Status: DC
  Administered 2019-05-13 – 2019-05-14 (×2): 2 g via INTRAVENOUS
  Filled 2019-05-13 (×2): qty 2

## 2019-05-13 MED ORDER — POTASSIUM CHLORIDE CRYS ER 20 MEQ PO TBCR
40.0000 meq | EXTENDED_RELEASE_TABLET | Freq: Once | ORAL | Status: AC
  Administered 2019-05-13: 40 meq via ORAL
  Filled 2019-05-13: qty 2

## 2019-05-13 MED ORDER — SODIUM CHLORIDE 0.9 % IV SOLN
2.0000 g | Freq: Once | INTRAVENOUS | Status: AC
  Administered 2019-05-13: 06:00:00 2 g via INTRAVENOUS
  Filled 2019-05-13: qty 2

## 2019-05-13 MED ORDER — BENZONATATE 100 MG PO CAPS: 200.0000 mg | ORAL_CAPSULE | Freq: Three times a day (TID) | ORAL | Status: DC | PRN

## 2019-05-13 MED ORDER — POTASSIUM CHLORIDE CRYS ER 20 MEQ PO TBCR
20.0000 meq | EXTENDED_RELEASE_TABLET | ORAL | Status: AC
  Administered 2019-05-13: 09:00:00 20 meq via ORAL
  Filled 2019-05-13: qty 1

## 2019-05-13 MED ORDER — MOMETASONE FURO-FORMOTEROL FUM 200-5 MCG/ACT IN AERO
2.0000 | INHALATION_SPRAY | Freq: Two times a day (BID) | RESPIRATORY_TRACT | Status: DC
  Administered 2019-05-13: 2 via RESPIRATORY_TRACT
  Filled 2019-05-13: qty 8.8

## 2019-05-13 MED ORDER — GUAIFENESIN ER 600 MG PO TB12
600.0000 mg | ORAL_TABLET | Freq: Two times a day (BID) | ORAL | Status: DC
  Administered 2019-05-13 – 2019-05-14 (×3): 600 mg via ORAL
  Filled 2019-05-13 (×3): qty 1

## 2019-05-13 MED ORDER — MAGNESIUM SULFATE 2 GM/50ML IV SOLN
2.0000 g | Freq: Once | INTRAVENOUS | Status: AC
  Administered 2019-05-13: 2 g via INTRAVENOUS
  Filled 2019-05-13: qty 50

## 2019-05-13 NOTE — Progress Notes (Signed)
05/13/2019 Patient transfer from the Emergency room to [place]. She is alert, oriented to person, place, time and situation. Patient have a pink and a little dry are on left elbow she stated she fell three year ago. Rico Sheehan RN

## 2019-05-13 NOTE — ED Notes (Signed)
Heather Molina (daughter) [phone]

## 2019-05-13 NOTE — ED Notes (Signed)
Dr Palumbo informed pt POC covid test neg 

## 2019-05-13 NOTE — ED Notes (Signed)
ED TO INPATIENT HANDOFF REPORT  ED Nurse Name and Phone #: Percell Locus, RN  S Name/Age/Gender Bradley Ferris 79 y.o. female Room/Bed: 012C/012C  Code Status   Code Status: Full Code  Home/SNF/Other Home Patient oriented to: self, place, time and situation Is this baseline? Yes   Triage Complete: Triage complete  Chief Complaint CAP (community acquired pneumonia) [J18.9]  Triage Note Fever and cough. Saw her provider about 1.5 weeks and was given abx and prednisone, symptoms still ongoing.     Allergies Not on File  Level of Care/Admitting Diagnosis ED Disposition    ED Disposition Condition [facility] [place] [[id number]]  Level of Care: Telemetry Medical [[id number]]  Covid Evaluation: Confirmed COVID Negative  Date Laboratory Confirmed COVID Negative: 05/13/2019  Diagnosis: CAP (community acquired pneumonia) [710626]  Admitting Physician: Norval Morton [[id number]]  Attending Physician: Norval Morton [[id number]]  Estimated length of stay: past midnight tomorrow  Certification:: I certify this patient will need inpatient services for at least 2 midnights       B Medical/Surgery History Past Medical History:  Diagnosis Date  . Anxiety   . Hypertension    Past Surgical History:  Procedure Laterality Date  . CHOLECYSTECTOMY    . HERNIA REPAIR       A IV Location/Drains/Wounds Patient Lines/Drains/Airways Status   Active Line/Drains/Airways    Name:   Placement date:   Placement time:   Site:   Days:   Peripheral IV 05/13/19 Left Antecubital   05/13/19    -    Antecubital   less than 1          Intake/Output Last 24 hours  Intake/Output Summary (Last 24 hours) at 05/13/2019 1054 Last data filed at 05/13/2019 0732 Gross per 24 hour  Intake 200 ml  Output -  Net 200 ml    Labs/Imaging Results for orders placed or performed during the hospital encounter of 05/12/19 (from the past 48 hour(s))  CBC     Status: Abnormal    Collection Time: 05/12/19 11:01 PM  Result Value Ref Range   WBC 23.9 (H) 4.0 - 10.5 K/uL   RBC 4.67 3.87 - 5.11 MIL/uL   Hemoglobin 14.7 12.0 - 15.0 g/dL   HCT 43.0 36.0 - 46.0 %   MCV 92.1 80.0 - 100.0 fL   MCH 31.5 26.0 - 34.0 pg   MCHC 34.2 30.0 - 36.0 g/dL   RDW 13.4 11.5 - 15.5 %   Platelets 171 150 - 400 K/uL   nRBC 0.0 0.0 - 0.2 %    Comment: Performed at [facility], [place] [address], [place], [place] [address]  Basic metabolic panel     Status: Abnormal   Collection Time: 05/12/19 11:07 PM  Result Value Ref Range   Sodium 130 (L) 135 - 145 mmol/L   Potassium 3.3 (L) 3.5 - 5.1 mmol/L   Chloride 93 (L) 98 - 111 mmol/L   CO2 25 22 - 32 mmol/L   Glucose, Bld 128 (H) 70 - 99 mg/dL    Comment: Glucose reference range applies only to samples taken after fasting for at least 8 hours.   BUN 11 8 - 23 mg/dL   Creatinine, Ser 0.86 0.44 - 1.00 mg/dL   Calcium 8.7 (L) 8.9 - 10.3 mg/dL   GFR calc non Af Amer >60 >60 mL/min   GFR calc Af Amer >60 >60 mL/min   Anion gap 12 5 - 15  Comment: Performed at [facility], [place] [address] [place], [place] [address]  POC SARS Coronavirus 2 Ag-ED - Nasal Swab (BD Veritor Kit)     Status: None   Collection Time: 05/13/19  5:38 AM  Result Value Ref Range   SARS Coronavirus 2 Ag NEGATIVE NEGATIVE    Comment: (NOTE) SARS-CoV-2 antigen NOT DETECTED.  Negative results are presumptive.  Negative results do not preclude SARS-CoV-2 infection and should not be used as the sole basis for treatment or other patient management decisions, including infection  control decisions, particularly in the presence of clinical signs and  symptoms consistent with COVID-19, or in those who have been in contact with the virus.  Negative results must be combined with clinical observations, patient history, and epidemiological information. The expected result is Negative. Fact Sheet for Patients: PodPark.tn Fact Sheet for  Healthcare Providers: GiftContent.is This test is not yet approved or cleared by the [place] FDA and  has been authorized for detection and/or diagnosis of SARS-CoV-2 by FDA under an Emergency Use Authorization (EUA).  This EUA will remain in effect (meaning this test can be used) for the duration of  the COVID-19 de claration under Section 564(b)(1) of the Act, 21 U.S.C. section 360bbb-3(b)(1), unless the authorization is terminated or revoked sooner.   Troponin I (High Sensitivity)     Status: Abnormal   Collection Time: 05/13/19  5:47 AM  Result Value Ref Range   Troponin I (High Sensitivity) 23 (H) <18 ng/L    Comment: (NOTE) Elevated high sensitivity troponin I (hsTnI) values and significant  changes across serial measurements may suggest ACS but many other  chronic and acute conditions are known to elevate hsTnI results.  Refer to the "Links" section for chest pain algorithms and additional  guidance. Performed at [facility], [place] [address] [place], [place] [address]   SARS CORONAVIRUS 2 (TAT 6-24 HRS) Nasopharyngeal Nasopharyngeal Swab     Status: None   Collection Time: 05/13/19  6:05 AM   Specimen: Nasopharyngeal Swab  Result Value Ref Range   SARS Coronavirus 2 NEGATIVE NEGATIVE    Comment: (NOTE) SARS-CoV-2 target nucleic acids are NOT DETECTED. The SARS-CoV-2 RNA is generally detectable in upper and lower respiratory specimens during the acute phase of infection. Negative results do not preclude SARS-CoV-2 infection, do not rule out co-infections with other pathogens, and should not be used as the sole basis for treatment or other patient management decisions. Negative results must be combined with clinical observations, patient history, and epidemiological information. The expected result is Negative. Fact Sheet for Patients: SugarRoll.be Fact Sheet for Healthcare  Providers: https://www.woods-mathews.com/ This test is not yet approved or cleared by the [place] FDA and  has been authorized for detection and/or diagnosis of SARS-CoV-2 by FDA under an Emergency Use Authorization (EUA). This EUA will remain  in effect (meaning this test can be used) for the duration of the COVID-19 declaration under Section 56 4(b)(1) of the Act, 21 U.S.C. section 360bbb-3(b)(1), unless the authorization is terminated or revoked sooner. Performed at [facility], [place] [address], [place], [place] [address]   Troponin I (High Sensitivity)     Status: Abnormal   Collection Time: 05/13/19  7:32 AM  Result Value Ref Range   Troponin I (High Sensitivity) 19 (H) <18 ng/L    Comment: (NOTE) Elevated high sensitivity troponin I (hsTnI) values and significant  changes across serial measurements may suggest ACS but many other  chronic  and acute conditions are known to elevate hsTnI results.  Refer to the "Links" section for chest pain algorithms and additional  guidance. Performed at [facility], [place] [address] [place], [place] [address]   Basic metabolic panel     Status: Abnormal   Collection Time: 05/13/19  7:32 AM  Result Value Ref Range   Sodium 127 (L) 135 - 145 mmol/L   Potassium 3.3 (L) 3.5 - 5.1 mmol/L   Chloride 90 (L) 98 - 111 mmol/L   CO2 24 22 - 32 mmol/L   Glucose, Bld 162 (H) 70 - 99 mg/dL    Comment: Glucose reference range applies only to samples taken after fasting for at least 8 hours.   BUN 15 8 - 23 mg/dL   Creatinine, Ser 0.83 0.44 - 1.00 mg/dL   Calcium 8.1 (L) 8.9 - 10.3 mg/dL   GFR calc non Af Amer >60 >60 mL/min   GFR calc Af Amer >60 >60 mL/min   Anion gap 13 5 - 15    Comment: Performed at [facility] [address], [place], [place] [address]  Magnesium     Status: Abnormal   Collection Time: 05/13/19  7:32 AM  Result Value Ref Range   Magnesium 1.6 (L) 1.7 - 2.4 mg/dL    Comment: Performed at  [facility] [address], [place], [place] [address]  CBG monitoring, ED     Status: Abnormal   Collection Time: 05/13/19  8:01 AM  Result Value Ref Range   Glucose-Capillary 126 (H) 70 - 99 mg/dL    Comment: Glucose reference range applies only to samples taken after fasting for at least 8 hours.   CT ANGIO CHEST PE W OR WO CONTRAST  Result Date: 05/13/2019 CLINICAL DATA:  Shortness of breath. Hypoxia. Found to be in atrial flutter. EXAM: CT ANGIOGRAPHY CHEST WITH CONTRAST TECHNIQUE: Multidetector CT imaging of the chest was performed using the standard protocol during bolus administration of intravenous contrast. Multiplanar CT image reconstructions and MIPs were obtained to evaluate the vascular anatomy. CONTRAST:  31m OMNIPAQUE IOHEXOL 350 MG/ML SOLN COMPARISON:  Chest x-ray on 05/12/2019 FINDINGS: Cardiovascular: There is atherosclerotic calcification of the coronary arteries. Heart size is normal. No pericardial effusion. Pulmonary arteries are well opacified. There is no acute pulmonary embolus. Significant atherosclerosis of the thoracic aorta. No aneurysm. Mediastinum/Nodes: The visualized portion of the thyroid gland has a normal appearance. Moderate hiatal hernia. Otherwise the esophagus is unremarkable. No significant mediastinal, hilar, or axillary adenopathy. Lungs/Pleura: There are patchy airspace filling opacities and consolidation within the LEFT LOWER lobe. Soft tissue density within the LEFT LOWER lobe bronchus may represent secretions or infection. Follow-up is recommended to document clearing. Upper Abdomen: Status post cholecystectomy. Nodular, rounded surface of the liver consistent with cirrhosis. There is atherosclerotic calcification of the abdominal aorta. Musculoskeletal: Scoliosis. Midthoracic degenerative changes. Mild anterior wedge deformities of the T6, T7, T8 consistent with remote compression fractures and resulting in focal kyphosis. No lytic or blastic  lesions. Review of the MIP images confirms the above findings. IMPRESSION: 1. Technically adequate exam showing no acute pulmonary embolus. 2. Coronary artery disease. 3. Moderate hiatal hernia. 4. LEFT LOWER lobe infiltrate, likely infectious. Soft tissue density within the LEFT LOWER lobe bronchus, favoring secretions or infection. Follow-up chest x-ray is recommended to document resolution of LEFT LOWER lobe opacity. 5. Cirrhotic morphology of the liver. 6. Remote compression fractures of T6, T7, T8. 7. Aortic Atherosclerosis (ICD10-I70.0). Electronically  Signed   By: Nolon Nations M.D.   On: 05/13/2019 10:28   DG Chest Portable 1 View  Result Date: 05/12/2019 CLINICAL DATA:  Cough, fever EXAM: PORTABLE CHEST 1 VIEW COMPARISON:  05/01/2019 FINDINGS: There is hyperinflation of the lungs compatible with COPD. Heart is normal size. Left basilar airspace opacity, atelectasis versus infiltrate. Small left effusion. No acute bony abnormality. IMPRESSION: Left basilar atelectasis or infiltrate.  Small left effusion. COPD Electronically Signed   By: Rolm Baptise M.D.   On: 05/12/2019 23:37    Pending Labs Unresulted Labs (From admission, onward)    Start     Ordered   05/14/19 4268  Basic metabolic panel  Tomorrow morning,   R     05/13/19 0752   05/13/19 0942  Procalcitonin - Baseline  Add-on,   AD     05/13/19 0941   05/13/19 0755  Lactic acid, plasma  Once,   STAT     05/13/19 0754   05/13/19 0747  Culture, sputum-assessment  Once,   R     05/13/19 0752   05/13/19 0746  Legionella Pneumophila Serogp 1 Ur Ag  Once,   STAT     05/13/19 0752   05/13/19 0746  Strep pneumoniae urinary antigen  Once,   STAT     05/13/19 0752   05/13/19 0428  HIV Antibody (routine testing w rflx)  Once,   R     05/13/19 0428   05/13/19 0405  Urinalysis, Routine w reflex microscopic  Once,   STAT    Question:  Release to patient  Answer:  Immediate   05/13/19 0405   05/13/19 0405  Blood culture (routine x 2)   BLOOD CULTURE X 2,   STAT    Question Answer Comment  Patient immune status Normal   Release to patient Immediate      05/13/19 0405          Vitals/Pain Today's Vitals   05/13/19 0800 05/13/19 0813 05/13/19 0830 05/13/19 0900  BP: 111/68 111/68 120/65 103/79  Pulse: 79 (!) 120 (!) 101 76  Resp: 17 17 19  (!) 22  Temp:  98.7 F (37.1 C)    TempSrc:  Oral    SpO2: 93% 92% 92% 91%  Weight:      Height:      PainSc:        Isolation Precautions Airborne and Contact precautions  Medications Medications  guaiFENesin (MUCINEX) 12 hr tablet 600 mg (600 mg Oral Given 05/13/19 0905)  enoxaparin (LOVENOX) injection 40 mg (has no administration in time range)  cefTRIAXone (ROCEPHIN) 2 g in sodium chloride 0.9 % 100 mL IVPB (has no administration in time range)  doxycycline (VIBRAMYCIN) 100 mg in sodium chloride 0.9 % 250 mL IVPB (100 mg Intravenous New Bag/Given 05/13/19 1049)  atenolol (TENORMIN) tablet 50 mg (50 mg Oral Given 05/13/19 0910)  PARoxetine (PAXIL) tablet 20 mg (has no administration in time range)  benzonatate (TESSALON) capsule 200 mg (has no administration in time range)  mometasone-formoterol (DULERA) 200-5 MCG/ACT inhaler 2 puff (has no administration in time range)  diphenoxylate-atropine (LOMOTIL) 2.5-0.025 MG per tablet 1 tablet (has no administration in time range)  aspirin EC tablet 81 mg (has no administration in time range)  albuterol (PROVENTIL) (2.5 MG/3ML) 0.083% nebulizer solution 2.5 mg (has no administration in time range)  ceFEPIme (MAXIPIME) 2 g in sodium chloride 0.9 % 100 mL IVPB (0 g Intravenous Stopped 05/13/19 0622)  vancomycin (VANCOCIN) IVPB 1000 mg/200  mL premix (0 mg Intravenous Stopped 05/13/19 0732)  potassium chloride SA (KLOR-CON) CR tablet 40 mEq (40 mEq Oral Given 05/13/19 0552)  potassium chloride SA (KLOR-CON) CR tablet 20 mEq (20 mEq Oral Given 05/13/19 0905)  iohexol (OMNIPAQUE) 350 MG/ML injection 75 mL (75 mLs Intravenous Contrast  Given 05/13/19 1008)    Mobility walks with person assist High fall risk   Focused Assessments Cardiac Assessment Handoff:  Cardiac Rhythm: Normal sinus rhythm, afib/aflutter No results found for: CKTOTAL, CKMB, CKMBINDEX, TROPONINI No results found for: DDIMER Does the Patient currently have chest pain? No      R Recommendations: See Admitting Provider Note  Report given to:   Additional Notes:

## 2019-05-13 NOTE — H&P (Addendum)
History and Physical    [name]:[id number] DOB: 11-05-1940 DOA: 05/12/2019  Referring MD/NP/PA: Midge Minium, MD PCP: Hadley Pen, MD  Patient coming from: Home(where great-grandson lives with her)  Chief Complaint: Cough and fever  I have personally briefly reviewed patient's old medical records in [facility]   HPI: Heather Molina is a 79 y.o. female with medical history significant of hypertension and anxiety presents with complaints of cough and fever over the last 2 -3 weeks.  Patient reports having a productive cough and intermittent fevers with temperatures up to 100.49F at home.  She does not report feeling short of breath at rest. Associated symptoms include chills, wheezing, increased lethargy, and poor appetite.  She was seen by her primary care provider on 3/8 for symptomsand given a Kenalog shot with a Z-Pak.  Patient completed the Z-Pak, but stated symptoms still present and was becoming increasingly weak.  She followed up with her PCP 5 days ago, and was prescribed cough medicine which did not help.  Last night her daughter came to check on her after she was not answering the phone and found her oxygenation to be around 88% on room air.  Daughter also informed me that the patient had complained of some chest and back discomfort.  Her daughter brought her to the hospital for further evaluation.  Patient is on Symbicort and uses it intermittently, but states that it was given one time when she had a cold.  Denies any history of irregular heart rhythm or heart failure to her knowledge and is on a 81 mg aspirin daily  ED Course: Upon admission into the emergency department patient was noted to be afebrile, heart rate 55-96, respirations 16-21, blood pressures maintained, and O2 saturations 91-92% on room air.  Labs from 3/19 significant for WBC 23.9, sodium 130, potassium 3.3, CO2 25, glucose 128, and calcium 8.7.  Chest x-ray revealed signs of left basilar infiltrate vs.  atelectasis and a small pleural effusion.  Point-of-care COVID-19 testing was negative.  She was started on empiric antibiotics of vancomycin and cefepime.  While in the ED patient was noted to have several episodes of NSVT with possible atrial fibrillation/flutter.   Review of Systems  Constitutional: Positive for chills, fever and malaise/fatigue.  HENT: Negative for congestion and nosebleeds.   Eyes: Negative for photophobia and pain.  Respiratory: Positive for cough, sputum production and wheezing.   Cardiovascular: Positive for chest pain and palpitations. Negative for leg swelling.  Gastrointestinal: Negative for nausea and vomiting.  Genitourinary: Negative for dysuria and hematuria.  Musculoskeletal: Positive for back pain. Negative for falls.  Skin: Negative for rash.  Neurological: Positive for weakness and headaches. Negative for focal weakness.  Psychiatric/Behavioral: Negative for memory loss and substance abuse.    Past Medical History:  Diagnosis Date  . Anxiety   . Hypertension     Past Surgical History:  Procedure Laterality Date  . CHOLECYSTECTOMY    . HERNIA REPAIR       reports that she has quit smoking. She does not have any smokeless tobacco history on file. She reports previous alcohol use. She reports previous drug use.  Not on File  Family History  Problem Relation Age of Onset  . Epilepsy Father   . CVA Father      Current Outpatient Medications on File Prior to Encounter  Medication Sig Dispense Refill  . alendronate (FOSAMAX) 70 MG tablet Take 70 mg by mouth once a week.    Marland Kitchen  aspirin 81 MG EC tablet Take 81 mg by mouth daily.    Marland Kitchen atenolol-chlorthalidone (TENORETIC) 50-25 MG tablet Take 1 tablet by mouth daily.    . benzonatate (TESSALON) 200 MG capsule Take 200 mg by mouth 3 (three) times daily as needed.    . budesonide-formoterol (SYMBICORT) 160-4.5 MCG/ACT inhaler Inhale 2 puffs into the lungs 2 (two) times daily.    .  diphenoxylate-atropine (LOMOTIL) 2.5-0.025 MG tablet Take 1 tablet by mouth 4 (four) times daily as needed for diarrhea or loose stools.    Marland Kitchen PARoxetine (PAXIL) 20 MG tablet Take 20 mg by mouth daily.       Physical Exam:  Constitutional: Elderly female who appears to be sick and intermittently coughing Vitals:   05/13/19 0800 05/13/19 0813 05/13/19 0830 05/13/19 0900  BP: 111/68 111/68 120/65 103/79  Pulse: 79 (!) 120 (!) 101 76  Resp: 17 17 19  (!) 22  Temp:  98.7 F (37.1 C)    TempSrc:  Oral    SpO2: 93% 92% 92% 91%  Weight:      Height:       Eyes: PERRL, lids and conjunctivae normal ENMT: Mucous membranes are moist. Posterior pharynx clear of any exudate or lesions.  Neck: normal, supple, no masses, no thyromegaly Respiratory: Normal respiratory effort with some mild crackles noted in the lower left lung field.  No wheezes or rhonchi appreciated.  Patient currently on room air with O2 saturations hovering around 91%.  Able to talk and early complete sentences. Cardiovascular: Intermittently irregular rhythm, no murmurs / rubs / gallops. No extremity edema. 2+ pedal pulses. No carotid bruits.  Abdomen: no tenderness, no masses palpated. No hepatosplenomegaly. Bowel sounds positive.  Musculoskeletal: no clubbing / cyanosis. No joint deformity upper and lower extremities. Good ROM, no contractures. Normal muscle tone.  Skin: no rashes, lesions, ulcers. No induration Neurologic: CN 2-12 grossly intact. Sensation intact, DTR normal. Strength 5/5 in all 4.  Psychiatric: Normal judgment and insight. Alert and oriented x 3. Normal mood.     Labs on Admission: I have personally reviewed following labs and imaging studies  CBC: Recent Labs  Lab 05/12/19 2301  WBC 23.9*  HGB 14.7  HCT 43.0  MCV 92.1  PLT 171   Basic Metabolic Panel: Recent Labs  Lab 05/12/19 2307 05/13/19 0732  NA 130* 127*  K 3.3* 3.3*  CL 93* 90*  CO2 25 24  GLUCOSE 128* 162*  BUN 11 15  CREATININE  0.86 0.83  CALCIUM 8.7* 8.1*  MG  --  1.6*   GFR: Estimated Creatinine Clearance: 38.8 mL/min (by C-G formula based on SCr of 0.83 mg/dL). Liver Function Tests: No results for input(s): AST, ALT, ALKPHOS, BILITOT, PROT, ALBUMIN in the last 168 hours. No results for input(s): LIPASE, AMYLASE in the last 168 hours. No results for input(s): AMMONIA in the last 168 hours. Coagulation Profile: No results for input(s): INR, PROTIME in the last 168 hours. Cardiac Enzymes: No results for input(s): CKTOTAL, CKMB, CKMBINDEX, TROPONINI in the last 168 hours. BNP (last 3 results) No results for input(s): PROBNP in the last 8760 hours. HbA1C: No results for input(s): HGBA1C in the last 72 hours. CBG: Recent Labs  Lab 05/13/19 0801  GLUCAP 126*   Lipid Profile: No results for input(s): CHOL, HDL, LDLCALC, TRIG, CHOLHDL, LDLDIRECT in the last 72 hours. Thyroid Function Tests: No results for input(s): TSH, T4TOTAL, FREET4, T3FREE, THYROIDAB in the last 72 hours. Anemia Panel: No results for input(s): VITAMINB12,  FOLATE, FERRITIN, TIBC, IRON, RETICCTPCT in the last 72 hours. Urine analysis: No results found for: COLORURINE, APPEARANCEUR, LABSPEC, PHURINE, GLUCOSEU, HGBUR, BILIRUBINUR, KETONESUR, PROTEINUR, UROBILINOGEN, NITRITE, LEUKOCYTESUR Sepsis Labs: No results found for this or any previous visit (from the past 240 hour(s)).   Radiological Exams on Admission: DG Chest Portable 1 View  Result Date: 05/12/2019 CLINICAL DATA:  Cough, fever EXAM: PORTABLE CHEST 1 VIEW COMPARISON:  05/01/2019 FINDINGS: There is hyperinflation of the lungs compatible with COPD. Heart is normal size. Left basilar airspace opacity, atelectasis versus infiltrate. Small left effusion. No acute bony abnormality. IMPRESSION: Left basilar atelectasis or infiltrate.  Small left effusion. COPD Electronically Signed   By: Charlett Nose M.D.   On: 05/12/2019 23:37    EKG: Independently reviewed.  Sinus rhythm at 98 bpm  with QTc 495  Assessment/Plan Hypoxia secondary to Community acquired pneumonia): Patient presents with productive cough and reported fever 100.3 F prior to arrival with reports of hypoxia with O2 sats at home of 88%.  O2 saturations currently maintained on room air at this time.  Treated previously in the outpatient setting with azithromycin and prednisone without improvement in symptoms.  Labs revealed WBC 23.9, but no initial lactic acid was obtained..  Chest x-ray concerning for left basilar infiltrate with small left effusion.  Patient had initially been started on vancomycin and cefepime. -Admit to a medical telemetry bed -Continuous pulse oximetry with nasal cannula oxygen as needed -Follow-up blood and sputum culture  -Check procalcitonin -Follow-up confirmatory COVID-19 testing -Aspiration precautions and elevate head of bed -Changed antibiotics to Rocephin and doxycycline -Mucinex -Continue pharmacy substitution for Symbicort, but it is not totally clear on why she was on this in the first place. -Albuterol as needed for shortness of breath/wheezing  NSVT with possible atrial flutter/atrial fibrillation: Acute.  Patient reports no previous history of arrhythmia, but was noted to have episodes of NSVT in atrial flutter/fibrillation while in the ED. Patient was noted to have low potassium discussed with cardiology who suspect this could just be secondary to acute infection. -Check CT angiogram chest to rule out the possibility of pulmonary embolus given atrial flutter -Check echocardiogram  -Goal potassium 4 and magnesium 2 -Consider formally consulting cardiology if symptoms persist  Leukocytosis/sepsis: Acute.  WBC elevated to 23.9 with reports of fever up to 100.3 F at home.  No initial lactic acid was obtained. -Check lactic acid -Follow-up cultures   Elevated troponin: Acute.  Patient did note some complaints of chest and back discomfort.  High-sensitivity troponins 23->19.   Suspect this could be secondary to demand in the setting of pneumonia. -Continue to monitor  Hypokalemia: Acute.  Admission potassium noted to be 3.3.  This could be secondary to diuretic and poor appetite.  Patient had been given 40 mEq of potassium chloride in the ED. -Give 20 mEq of potassium chloride p.o. x1 dose now -Check magnesium level -Continue to monitor and replace as needed  Prolonged QT interval: Patient's initial QTc was noted to be 495. -Correcting electrolyte abnormalities -Consider rechecking EKG in a.m.    Hyponatremia: Acute.  Patient's initial sodium noted to be 130, but suspect this is secondary to poor p.o. intake and diuretic use. -Holding chlorthalidone  Essential hypertension: Home meds include atenolol-hydrochlorothiazide -Continue atenolol  Anxiety: Home medications include Paxil 20 mg daily. -Continue Paxil  DVT prophylaxis: Lovenox Code Status: Full Family Communication: Discussed plan of care with the patient's daughter present at bedside Disposition Plan: Likely discharge home once medically stable Consults  called: None Admission status: Inpatient  Clydie Braun MD [facility] Pager [phone]   If 7PM-7AM, please contact night-coverage [email or web address] Password [facility]  05/13/2019, 9:29 AM

## 2019-05-13 NOTE — ED Notes (Signed)
Notified admitting provider of pt's increased HR; will continue to monitor, awaiting orders.

## 2019-05-13 NOTE — ED Notes (Signed)
Pt transported to CT ?

## 2019-05-13 NOTE — ED Provider Notes (Signed)
[facility] EMERGENCY DEPARTMENT Provider Note   CSN: [id number] Arrival date & time: 05/12/19  2224     History Chief Complaint  Patient presents with  . Fever    Heather Molina is a 79 y.o. female.  The history is provided by the patient.  Fever Max temp prior to arrival:  100.5 Temp source:  Oral Severity:  Moderate Onset quality:  Gradual Timing:  Intermittent Progression:  Unchanged Chronicity:  New Relieved by:  Nothing Worsened by:  Nothing Ineffective treatments:  None tried Associated symptoms: cough   Associated symptoms: no chest pain, no congestion and no rash   Risk factors: no sick contacts   Seen for fevers and cough by PMD and was placed on zpak and prednisone.  Has continued to cough up phlegm and have intermittent fevers after completing antibiotics.   No covid contacts is vaccinated.  No CP.  No n/v/d.       History reviewed. No pertinent past medical history.  There are no problems to display for this patient.   Past Surgical History:  Procedure Laterality Date  . CHOLECYSTECTOMY    . HERNIA REPAIR       OB History   No obstetric history on file.     No family history on file.  Social History   Tobacco Use  . Smoking status: Former Smoker  Substance Use Topics  . Alcohol use: Not Currently  . Drug use: Not Currently    Home Medications Prior to Admission medications   Not on File    Allergies    Patient has no allergy information on record.  Review of Systems   Review of Systems  Constitutional: Positive for fever.  HENT: Negative for congestion.   Eyes: Negative for visual disturbance.  Respiratory: Positive for cough.   Cardiovascular: Negative for chest pain.  Gastrointestinal: Negative for abdominal pain.  Genitourinary: Negative for difficulty urinating.  Musculoskeletal: Negative for arthralgias.  Skin: Negative for rash.  Neurological: Negative for dizziness.  Psychiatric/Behavioral: Negative  for agitation.  All other systems reviewed and are negative.   Physical Exam Updated Vital Signs BP 124/79   Pulse 90   Temp 98.1 F (36.7 C) (Oral)   Resp 18   Ht 5' (1.524 m)   Wt 52.6 kg   SpO2 91%   BMI 22.65 kg/m   Physical Exam Vitals reviewed.  Constitutional:      General: She is not in acute distress.    Appearance: She is normal weight.  HENT:     Head: Normocephalic and atraumatic.     Nose: Nose normal.  Eyes:     Conjunctiva/sclera: Conjunctivae normal.     Pupils: Pupils are equal, round, and reactive to light.  Cardiovascular:     Rate and Rhythm: Normal rate and regular rhythm.     Pulses: Normal pulses.     Heart sounds: Normal heart sounds.  Pulmonary:     Effort: Pulmonary effort is normal.     Breath sounds: Normal breath sounds.  Abdominal:     General: Abdomen is flat. Bowel sounds are normal.     Tenderness: There is no abdominal tenderness. There is no guarding.  Musculoskeletal:        General: Normal range of motion.     Cervical back: Normal range of motion and neck supple.  Skin:    General: Skin is warm and dry.     Capillary Refill: Capillary refill takes less than 2 seconds.  Neurological:     General: No focal deficit present.     Mental Status: She is alert and oriented to person, place, and time.  Psychiatric:        Mood and Affect: Mood normal.        Behavior: Behavior normal.     ED Results / Procedures / Treatments   Labs (all labs ordered are listed, but only abnormal results are displayed) Labs Reviewed  CBC - Abnormal; Notable for the following components:      Result Value   WBC 23.9 (*)    All other components within normal limits  BASIC METABOLIC PANEL - Abnormal; Notable for the following components:   Sodium 130 (*)    Potassium 3.3 (*)    Chloride 93 (*)    Glucose, Bld 128 (*)    Calcium 8.7 (*)    All other components within normal limits  CULTURE, BLOOD (ROUTINE X 2)  CULTURE, BLOOD (ROUTINE X 2)    SARS CORONAVIRUS 2 (TAT 6-24 HRS)  URINALYSIS, ROUTINE W REFLEX MICROSCOPIC  POC SARS CORONAVIRUS 2 AG -  ED  TROPONIN I (HIGH SENSITIVITY)    EKG  Radiology DG Chest Portable 1 View  Result Date: 05/12/2019 CLINICAL DATA:  Cough, fever EXAM: PORTABLE CHEST 1 VIEW COMPARISON:  05/01/2019 FINDINGS: There is hyperinflation of the lungs compatible with COPD. Heart is normal size. Left basilar airspace opacity, atelectasis versus infiltrate. Small left effusion. No acute bony abnormality. IMPRESSION: Left basilar atelectasis or infiltrate.  Small left effusion. COPD Electronically Signed   By: Rolm Baptise M.D.   On: 05/12/2019 23:37    Procedures Procedures (including critical care time)  Medications Ordered in ED Medications  ceFEPIme (MAXIPIME) 2 g in sodium chloride 0.9 % 100 mL IVPB (has no administration in time range)  vancomycin (VANCOCIN) IVPB 1000 mg/200 mL premix (has no administration in time range)  potassium chloride SA (KLOR-CON) CR tablet 40 mEq (has no administration in time range)    ED Course  I have reviewed the triage vital signs and the nursing notes.  Pertinent labs & imaging results that were available during my care of the patient were reviewed by me and considered in my medical decision making (see chart for details).    Admit to medicine failure of outpatient antibiotics.   Final Clinical Impression(s) / ED Diagnoses Final diagnoses:  None    Rx / DC Orders ED Discharge Orders    None       Makenly Larabee, MD 05/13/19 [id number]

## 2019-05-14 ENCOUNTER — Inpatient Hospital Stay (HOSPITAL_COMMUNITY): Payer: Medicare Other

## 2019-05-14 DIAGNOSIS — I361 Nonrheumatic tricuspid (valve) insufficiency: Secondary | ICD-10-CM

## 2019-05-14 DIAGNOSIS — I498 Other specified cardiac arrhythmias: Secondary | ICD-10-CM

## 2019-05-14 DIAGNOSIS — K7469 Other cirrhosis of liver: Secondary | ICD-10-CM

## 2019-05-14 DIAGNOSIS — E876 Hypokalemia: Secondary | ICD-10-CM

## 2019-05-14 LAB — BASIC METABOLIC PANEL
Anion gap: 10 (ref 5–15)
BUN: 14 mg/dL (ref 8–23)
CO2: 25 mmol/L (ref 22–32)
Calcium: 8 mg/dL — ABNORMAL LOW (ref 8.9–10.3)
Chloride: 100 mmol/L (ref 98–111)
Creatinine, Ser: 0.79 mg/dL (ref 0.44–1.00)
GFR calc Af Amer: >60 mL/min (ref >60)
GFR calc non Af Amer: >60 mL/min (ref >60)
Glucose, Bld: 91 mg/dL (ref 70–99)
Potassium: 3.5 mmol/L (ref 3.5–5.1)
Sodium: 135 mmol/L (ref 135–145)

## 2019-05-14 LAB — ECHOCARDIOGRAM COMPLETE
Height: 60 in
Weight: 1848.34 oz

## 2019-05-14 LAB — MAGNESIUM: Magnesium: 2.5 mg/dL — ABNORMAL HIGH (ref 1.7–2.4)

## 2019-05-14 MED ORDER — DOXYCYCLINE MONOHYDRATE 100 MG PO TABS: 100.0000 mg | ORAL_TABLET | Freq: Two times a day (BID) | ORAL | 0 refills | Status: DC

## 2019-05-14 MED ORDER — AMOXICILLIN-POT CLAVULANATE 875-125 MG PO TABS: 1.0000 | ORAL_TABLET | Freq: Two times a day (BID) | ORAL | 0 refills | Status: AC

## 2019-05-14 MED ORDER — POTASSIUM CHLORIDE CRYS ER 20 MEQ PO TBCR
40.0000 meq | EXTENDED_RELEASE_TABLET | Freq: Once | ORAL | Status: AC
  Administered 2019-05-14: 40 meq via ORAL
  Filled 2019-05-14: qty 2

## 2019-05-14 MED ORDER — ATENOLOL 50 MG PO TABS: 50.0000 mg | ORAL_TABLET | Freq: Every day | ORAL | 1 refills | Status: DC

## 2019-05-14 MED ORDER — AMOXICILLIN-POT CLAVULANATE 875-125 MG PO TABS: 1.0000 | ORAL_TABLET | Freq: Two times a day (BID) | ORAL | 0 refills | Status: DC

## 2019-05-14 MED ORDER — GUAIFENESIN ER 600 MG PO TB12: 600.0000 mg | ORAL_TABLET | Freq: Two times a day (BID) | ORAL | 0 refills | Status: DC

## 2019-05-14 MED ORDER — ALBUTEROL SULFATE HFA 108 (90 BASE) MCG/ACT IN AERS: 2.0000 | INHALATION_SPRAY | Freq: Four times a day (QID) | RESPIRATORY_TRACT | 11 refills | Status: DC | PRN

## 2019-05-14 NOTE — Progress Notes (Signed)
PT Cancellation Note  Patient Details Name: Heather Molina MRN: [id number] DOB: October 21, 1940   Cancelled Treatment:    Reason Eval/Treat Not Completed: PT screened, no needs identified, will sign off  Spoke with Justice Rocher, Therapist, sports. She completed walking saturation with patient and states pt is walking fine. Pt currently with echo and then RN plans to discharge per MD orders. No PT needs currently.   Arby Barrette, PT Pager [phone]  Rexanne Mano 05/14/2019, 3:25 PM

## 2019-05-14 NOTE — Progress Notes (Signed)
  Echocardiogram 2D Echocardiogram has been performed.  Merrie Roof F 05/14/2019, 3:47 PM

## 2019-05-14 NOTE — Discharge Summary (Signed)
Physician Discharge Summary  Clare Houdeshell ZOX:[id number] DOB: 1940-05-25 DOA: 05/12/2019  PCP: Hadley Pen, MD  Admit date: 05/12/2019 Discharge date: 05/14/2019  Admitted From: Home Disposition: Home  Recommendations for Outpatient Follow-up:  1. Follow ups as below. 2. Please obtain CBC/BMP/Mag at follow up 3. Please follow up on the following pending results: Echocardiogram  Home Health: None Equipment/Devices: None  Discharge Condition: Stable CODE STATUS: Full code  Follow-up Information    Hadley Pen, MD. Schedule an appointment as soon as possible for a visit in 1 week(s).   Specialty: Family Medicine Contact information: [address] [place] [address] [phone]           Hospital Course: 79 year old female with history of hypertension, anxiety and asthma presenting with 2 to 3 weeks of cough, fever, chills and wheeze and admitted for community-acquired pneumonia after failed outpatient treatment.  She was treated for this with Z-Pak and Kenalog injection at PCP office without significant improvement.  Reportedly desaturated to 88% on room air prior to arrival.  In ED, hemodynamically stable.  Saturating 91 to 92% on room air.  WBC 24.  Na 130.  K3.3.  CXR with left lung opacity.  CTA chest negative for PE but left lower lobe infiltrate and possible suppression with left lower lobe bronchus.  COVID-19 negative.  Received vancomycin and cefepime in ED. admitted for community-acquired pneumonia.  Started on ceftriaxone and azithromycin.  The next day, patient felt well to go home and complete antibiotic course at home.  Respiratory symptoms resolved.  Blood cultures negative.  No palpitation, dizziness, chest pain, shortness of breath, cough, GI or UTI symptoms.  Ambulated on room air and maintained saturation above 95% without distress.   Patient discharged on Augmentin and doxycycline for 5 days.  Also added albuterol as needed.  She is  already on [place].  Hyponatremia and hypokalemia resolved.  Discontinued chlorothiazide on discharge.  See individual problem list below for more hospital course.  Discharge Diagnoses:  Community-acquired pneumonia-failed outpatient treatment with Z-Pak and Kenalog.  Improved with ceftriaxone and azithromycin here.  Blood cultures negative. -Discharged on Augmentin and doxycycline for 5 days. -Consider repeat chest x-ray in 3 to 4 weeks  Mild intermittent asthma-on Symbicort at home. -Continue home Symbicort -Added as needed albuterol  Leukocytosis/bandemia-likely due to pneumonia and steroid -Recheck CBC at follow-up.  Mildly elevated HS troponin-likely demand ischemia from possible hypoxia and sinus arrhythmia/tachycardia.  Patient without chest pain.  Respiratory symptoms resolved at the time of discharge. -Echocardiogram pending.  Sinus arrhythmia/prolonged QT-likely due to hypokalemia.  Patient is asymptomatic.  Hypokalemia resolved. -Continue home atenolol. -Follow echocardiogram-pending at the time of discharge. -May consider thyroid panel outpatient.  Hypokalemia/hyponatremia-likely due to chlorothiazide.  Resolved. -Discontinue chlorothiazide -Recheck BMP and magnesium at follow-up.  Essential hypertension: Normotensive. -Discharged on home atenolol.  Anxiety: Stable. -Continue home Paxil  Liver cirrhosis?-CTA chest raises concern -Outpatient follow-up  Grief: Per daughter, patient lost her husband and her dog recently.  Daughter supportive and planning to get her a new dog which would be helpful.  -Continue supportive care    Discharge Instructions  Discharge Instructions    Call MD for:  difficulty breathing, headache or visual disturbances   Complete by: As directed    Call MD for:  extreme fatigue   Complete by: As directed    Call MD for:  persistant dizziness or light-headedness   Complete by: As directed    Call MD for:  persistant nausea and  vomiting   Complete by: As directed    Call MD for:  temperature >100.4   Complete by: As directed    Diet - low sodium heart healthy   Complete by: As directed    Discharge instructions   Complete by: As directed    It has been a pleasure taking care of you! You were hospitalized with cough, fever, chills and wheeze likely due to pneumonia (lung infection) and COPD.  We started you on antibiotics.  With that, your symptoms improved to the point we think it is safe to let you go home and continue the antibiotics at home.  We have made some adjustments to your blood pressure medication during this hospitalization. Please review your new medication list and the directions before you take your medications.  Please follow-up with your primary care doctor in 1 to 2 weeks.  You may need some blood work to check your electrolytes and kidney number.  They can also make further adjustment to your blood pressure medications as needed.   Take care,   Increase activity slowly   Complete by: As directed      Allergies as of 05/14/2019   Not on File     Medication List    STOP taking these medications   atenolol-chlorthalidone 50-25 MG tablet Commonly known as: TENORETIC     TAKE these medications   albuterol 108 (90 Base) MCG/ACT inhaler Commonly known as: VENTOLIN HFA Inhale 2 puffs into the lungs every 6 (six) hours as needed for wheezing or shortness of breath.   alendronate 70 MG tablet Commonly known as: FOSAMAX Take 70 mg by mouth once a week.   amoxicillin-clavulanate 875-125 MG tablet Commonly known as: Augmentin Take 1 tablet by mouth 2 (two) times daily for 5 days.   aspirin 81 MG EC tablet Take 81 mg by mouth daily.   atenolol 50 MG tablet Commonly known as: TENORMIN Take 1 tablet (50 mg total) by mouth daily. Start taking on: May 15, 2019   benzonatate 200 MG capsule Commonly known as: TESSALON Take 200 mg by mouth 3 (three) times daily as needed.     budesonide-formoterol 160-4.5 MCG/ACT inhaler Commonly known as: SYMBICORT Inhale 2 puffs into the lungs 2 (two) times daily.   diphenoxylate-atropine 2.5-0.025 MG tablet Commonly known as: LOMOTIL Take 1 tablet by mouth 4 (four) times daily as needed for diarrhea or loose stools.   doxycycline 100 MG tablet Commonly known as: ADOXA Take 1 tablet (100 mg total) by mouth 2 (two) times daily.   guaiFENesin 600 MG 12 hr tablet Commonly known as: MUCINEX Take 1 tablet (600 mg total) by mouth 2 (two) times daily.   PARoxetine 20 MG tablet Commonly known as: PAXIL Take 20 mg by mouth daily.       Consultations:  None  Procedures/Studies:  2D Echo pending.   CT ANGIO CHEST PE W OR WO CONTRAST  Result Date: 05/13/2019 CLINICAL DATA:  Shortness of breath. Hypoxia. Found to be in atrial flutter. EXAM: CT ANGIOGRAPHY CHEST WITH CONTRAST TECHNIQUE: Multidetector CT imaging of the chest was performed using the standard protocol during bolus administration of intravenous contrast. Multiplanar CT image reconstructions and MIPs were obtained to evaluate the vascular anatomy. CONTRAST:  75mL OMNIPAQUE IOHEXOL 350 MG/ML SOLN COMPARISON:  Chest x-ray on 05/12/2019 FINDINGS: Cardiovascular: There is atherosclerotic calcification of the coronary arteries. Heart size is normal. No pericardial effusion. Pulmonary arteries are well opacified. There is no acute  pulmonary embolus. Significant atherosclerosis of the thoracic aorta. No aneurysm. Mediastinum/Nodes: The visualized portion of the thyroid gland has a normal appearance. Moderate hiatal hernia. Otherwise the esophagus is unremarkable. No significant mediastinal, hilar, or axillary adenopathy. Lungs/Pleura: There are patchy airspace filling opacities and consolidation within the LEFT LOWER lobe. Soft tissue density within the LEFT LOWER lobe bronchus may represent secretions or infection. Follow-up is recommended to document clearing. Upper  Abdomen: Status post cholecystectomy. Nodular, rounded surface of the liver consistent with cirrhosis. There is atherosclerotic calcification of the abdominal aorta. Musculoskeletal: Scoliosis. Midthoracic degenerative changes. Mild anterior wedge deformities of the T6, T7, T8 consistent with remote compression fractures and resulting in focal kyphosis. No lytic or blastic lesions. Review of the MIP images confirms the above findings. IMPRESSION: 1. Technically adequate exam showing no acute pulmonary embolus. 2. Coronary artery disease. 3. Moderate hiatal hernia. 4. LEFT LOWER lobe infiltrate, likely infectious. Soft tissue density within the LEFT LOWER lobe bronchus, favoring secretions or infection. Follow-up chest x-ray is recommended to document resolution of LEFT LOWER lobe opacity. 5. Cirrhotic morphology of the liver. 6. Remote compression fractures of T6, T7, T8. 7. Aortic Atherosclerosis (ICD10-I70.0). Electronically Signed   By: Norva Pavlov M.D.   On: 05/13/2019 10:28   DG Chest Portable 1 View  Result Date: 05/12/2019 CLINICAL DATA:  Cough, fever EXAM: PORTABLE CHEST 1 VIEW COMPARISON:  05/01/2019 FINDINGS: There is hyperinflation of the lungs compatible with COPD. Heart is normal size. Left basilar airspace opacity, atelectasis versus infiltrate. Small left effusion. No acute bony abnormality. IMPRESSION: Left basilar atelectasis or infiltrate.  Small left effusion. COPD Electronically Signed   By: Charlett Nose M.D.   On: 05/12/2019 23:37       Discharge Exam: Vitals:   05/13/19 2342 05/14/19 0925  BP: (!) 93/46 123/73  Pulse: 71   Resp:  18  Temp: 98.5 F (36.9 C) 98.2 F (36.8 C)  SpO2: 94% 96%    GENERAL: No acute distress.  Appears well.  HEENT: MMM.  Vision and hearing grossly intact.  NECK: Supple.  No apparent JVD.  RESP: 98% on RA.  No IWOB. Good air movement bilaterally. CVS:  RRR. Heart sounds normal.  ABD/GI/GU: Bowel sounds present. Soft. Non tender.   MSK/EXT:  Moves extremities. No apparent deformity or edema.  SKIN: no apparent skin lesion or wound NEURO: Awake, alert and oriented appropriately.  No apparent focal neuro deficit. PSYCH: Calm. Normal affect.   The results of significant diagnostics from this hospitalization (including imaging, microbiology, ancillary and laboratory) are listed below for reference.     Microbiology: Recent Results (from the past 240 hour(s))  Blood culture (routine x 2)     Status: None (Preliminary result)   Collection Time: 05/13/19  4:29 AM   Specimen: BLOOD  Result Value Ref Range Status   Specimen Description BLOOD RIGHT ANTECUBITAL  Final   Special Requests   Final    BOTTLES DRAWN AEROBIC AND ANAEROBIC Blood Culture results may not be optimal due to an inadequate volume of blood received in culture bottles   Culture   Final    NO GROWTH 1 DAY Performed at [facility], [address] [place], [place] [address]    Report Status PENDING  Incomplete  Blood culture (routine x 2)     Status: None (Preliminary result)   Collection Time: 05/13/19  4:30 AM   Specimen: BLOOD LEFT ARM  Result Value Ref Range Status   Specimen Description  BLOOD LEFT ARM  Final   Special Requests   Final    BOTTLES DRAWN AEROBIC AND ANAEROBIC Blood Culture results may not be optimal due to an excessive volume of blood received in culture bottles   Culture   Final    NO GROWTH 1 DAY Performed at [facility], [address], [place], [place] [address]    Report Status PENDING  Incomplete  SARS CORONAVIRUS 2 (TAT 6-24 HRS) Nasopharyngeal Nasopharyngeal Swab     Status: None   Collection Time: 05/13/19  6:05 AM   Specimen: Nasopharyngeal Swab  Result Value Ref Range Status   SARS Coronavirus 2 NEGATIVE NEGATIVE Final    Comment: (NOTE) SARS-CoV-2 target nucleic acids are NOT DETECTED. The SARS-CoV-2 RNA is generally detectable in upper and lower respiratory specimens during the acute phase of  infection. Negative results do not preclude SARS-CoV-2 infection, do not rule out co-infections with other pathogens, and should not be used as the sole basis for treatment or other patient management decisions. Negative results must be combined with clinical observations, patient history, and epidemiological information. The expected result is Negative. Fact Sheet for Patients: HairSlick.no Fact Sheet for Healthcare Providers: quierodirigir.com This test is not yet approved or cleared by the [place] FDA and  has been authorized for detection and/or diagnosis of SARS-CoV-2 by FDA under an Emergency Use Authorization (EUA). This EUA will remain  in effect (meaning this test can be used) for the duration of the COVID-19 declaration under Section 56 4(b)(1) of the Act, 21 U.S.C. section 360bbb-3(b)(1), unless the authorization is terminated or revoked sooner. Performed at [facility], [address] [place], [place] [address]      Labs: BNP (last 3 results) No results for input(s): BNP in the last 8760 hours. Basic Metabolic Panel: Recent Labs  Lab 05/12/19 2307 05/13/19 0732 05/14/19 0414  NA 130* 127* 135  K 3.3* 3.3* 3.5  CL 93* 90* 100  CO2 25 24 25   GLUCOSE 128* 162* 91  BUN 11 15 14   CREATININE 0.86 0.83 0.79  CALCIUM 8.7* 8.1* 8.0*  MG  --  1.6* 2.5*   Liver Function Tests: No results for input(s): AST, ALT, ALKPHOS, BILITOT, PROT, ALBUMIN in the last 168 hours. No results for input(s): LIPASE, AMYLASE in the last 168 hours. No results for input(s): AMMONIA in the last 168 hours. CBC: Recent Labs  Lab 05/12/19 2301  WBC 23.9*  HGB 14.7  HCT 43.0  MCV 92.1  PLT 171   Cardiac Enzymes: No results for input(s): CKTOTAL, CKMB, CKMBINDEX, TROPONINI in the last 168 hours. BNP: Invalid input(s): POCBNP CBG: Recent Labs  Lab 05/13/19 0801  GLUCAP 126*   D-Dimer No results for input(s):  DDIMER in the last 72 hours. Hgb A1c No results for input(s): HGBA1C in the last 72 hours. Lipid Profile No results for input(s): CHOL, HDL, LDLCALC, TRIG, CHOLHDL, LDLDIRECT in the last 72 hours. Thyroid function studies No results for input(s): TSH, T4TOTAL, T3FREE, THYROIDAB in the last 72 hours.  Invalid input(s): FREET3 Anemia work up No results for input(s): VITAMINB12, FOLATE, FERRITIN, TIBC, IRON, RETICCTPCT in the last 72 hours. Urinalysis No results found for: COLORURINE, APPEARANCEUR, LABSPEC, PHURINE, GLUCOSEU, HGBUR, BILIRUBINUR, KETONESUR, PROTEINUR, UROBILINOGEN, NITRITE, LEUKOCYTESUR Sepsis Labs Invalid input(s): PROCALCITONIN,  WBC,  LACTICIDVEN   Time coordinating discharge: 35 minutes  SIGNED:  Almon Hercules, MD  [facility] 05/14/2019, 3:28 PM  If 7PM-7AM, please contact night-coverage [email or web address] Password [email or web address]

## 2019-05-14 NOTE — Progress Notes (Signed)
SATURATION QUALIFICATIONS: (This note is used to comply with regulatory documentation for home oxygen)  Patient Saturations on Room Air at Rest = 97%  Patient Saturations on Room Air while Ambulating = 95%  Patient Saturations on n/a Liters of oxygen while Ambulating = n/a%  Please briefly explain why patient needs home oxygen: no complain

## 2019-05-18 LAB — CULTURE, BLOOD (ROUTINE X 2)
Culture: NO GROWTH
Culture: NO GROWTH

## 2019-05-19 DIAGNOSIS — D72829 Elevated white blood cell count, unspecified: Secondary | ICD-10-CM | POA: Diagnosis not present

## 2019-05-19 DIAGNOSIS — J189 Pneumonia, unspecified organism: Secondary | ICD-10-CM | POA: Diagnosis not present

## 2019-05-19 DIAGNOSIS — E876 Hypokalemia: Secondary | ICD-10-CM | POA: Diagnosis not present

## 2019-05-19 DIAGNOSIS — E871 Hypo-osmolality and hyponatremia: Secondary | ICD-10-CM | POA: Diagnosis not present

## 2019-05-19 DIAGNOSIS — Z09 Encounter for follow-up examination after completed treatment for conditions other than malignant neoplasm: Secondary | ICD-10-CM | POA: Diagnosis not present

## 2019-06-09 DIAGNOSIS — J189 Pneumonia, unspecified organism: Secondary | ICD-10-CM | POA: Diagnosis not present

## 2019-06-27 DIAGNOSIS — J441 Chronic obstructive pulmonary disease with (acute) exacerbation: Secondary | ICD-10-CM | POA: Diagnosis not present

## 2019-08-08 DIAGNOSIS — R601 Generalized edema: Secondary | ICD-10-CM

## 2019-09-26 DIAGNOSIS — J449 Chronic obstructive pulmonary disease, unspecified: Secondary | ICD-10-CM | POA: Diagnosis not present

## 2019-09-26 DIAGNOSIS — J441 Chronic obstructive pulmonary disease with (acute) exacerbation: Secondary | ICD-10-CM | POA: Diagnosis not present

## 2019-09-26 DIAGNOSIS — K580 Irritable bowel syndrome with diarrhea: Secondary | ICD-10-CM | POA: Diagnosis not present

## 2019-09-26 DIAGNOSIS — R05 Cough: Secondary | ICD-10-CM | POA: Diagnosis not present

## 2019-09-27 DIAGNOSIS — K580 Irritable bowel syndrome with diarrhea: Secondary | ICD-10-CM

## 2019-10-05 DIAGNOSIS — Z0000 Encounter for general adult medical examination without abnormal findings: Secondary | ICD-10-CM | POA: Diagnosis not present

## 2019-10-05 DIAGNOSIS — E782 Mixed hyperlipidemia: Secondary | ICD-10-CM | POA: Diagnosis not present

## 2019-10-05 DIAGNOSIS — J449 Chronic obstructive pulmonary disease, unspecified: Secondary | ICD-10-CM | POA: Diagnosis not present

## 2019-10-05 DIAGNOSIS — Z1211 Encounter for screening for malignant neoplasm of colon: Secondary | ICD-10-CM | POA: Diagnosis not present

## 2019-10-05 DIAGNOSIS — F3342 Major depressive disorder, recurrent, in full remission: Secondary | ICD-10-CM | POA: Diagnosis not present

## 2019-10-05 DIAGNOSIS — I10 Essential (primary) hypertension: Secondary | ICD-10-CM | POA: Diagnosis not present

## 2019-11-18 DIAGNOSIS — Z23 Encounter for immunization: Secondary | ICD-10-CM | POA: Diagnosis not present

## 2019-12-22 DIAGNOSIS — J441 Chronic obstructive pulmonary disease with (acute) exacerbation: Secondary | ICD-10-CM | POA: Diagnosis not present

## 2019-12-22 DIAGNOSIS — R059 Cough, unspecified: Secondary | ICD-10-CM | POA: Diagnosis not present

## 2019-12-22 DIAGNOSIS — J90 Pleural effusion, not elsewhere classified: Secondary | ICD-10-CM | POA: Diagnosis not present

## 2020-12-21 DIAGNOSIS — I340 Nonrheumatic mitral (valve) insufficiency: Secondary | ICD-10-CM

## 2020-12-21 DIAGNOSIS — I361 Nonrheumatic tricuspid (valve) insufficiency: Secondary | ICD-10-CM

## 2020-12-26 DIAGNOSIS — F17210 Nicotine dependence, cigarettes, uncomplicated: Secondary | ICD-10-CM

## 2020-12-26 DIAGNOSIS — Z8673 Personal history of transient ischemic attack (TIA), and cerebral infarction without residual deficits: Secondary | ICD-10-CM | POA: Insufficient documentation

## 2020-12-26 DIAGNOSIS — I6381 Other cerebral infarction due to occlusion or stenosis of small artery: Secondary | ICD-10-CM | POA: Insufficient documentation

## 2020-12-26 HISTORY — DX: Personal history of transient ischemic attack (TIA), and cerebral infarction without residual deficits: Z86.73

## 2021-11-10 IMAGING — CT CT ANGIO CHEST
3 of 7 series · 18 of 36 positions shown · IV contrast (omnipaque)
Comparison: Chest x-ray on 05/12/2019

CLINICAL DATA: Shortness of breath. Hypoxia. Found to be in atrial
flutter.

EXAM:
CT ANGIOGRAPHY CHEST WITH CONTRAST
TECHNIQUE: Multidetector CT imaging of the chest was performed using the
standard protocol during bolus administration of intravenous
contrast. Multiplanar CT image reconstructions and MIPs were
obtained to evaluate the vascular anatomy.
CONTRAST:  75mL OMNIPAQUE IOHEXOL 350 MG/ML SOLN

[Series 6: pe thins · axial · 0.73mm/px · z∈[+1006,+1275]mm · 15 of 441 slices shown]
[im 28/441  lung]
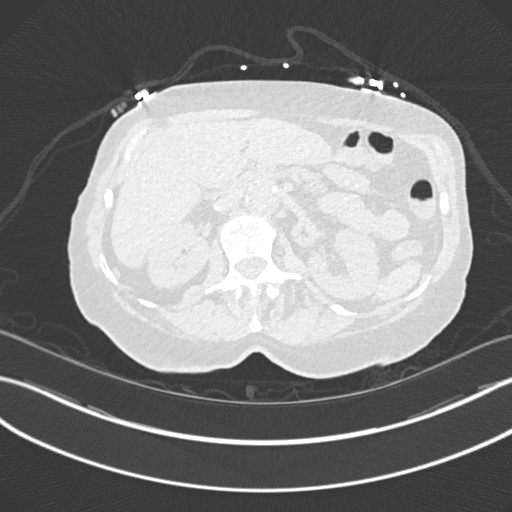
[im 56/441  mediastinal]
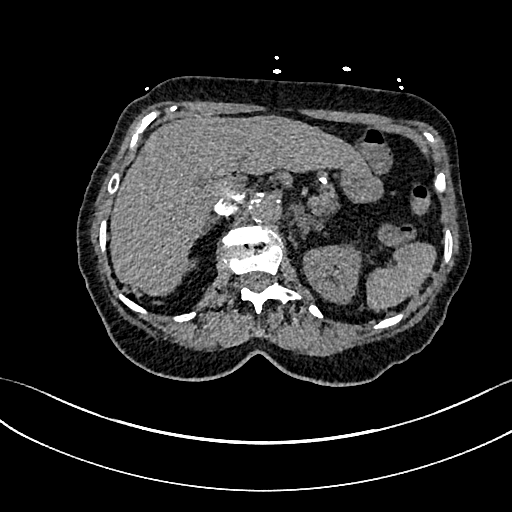
[im 83/441  lung]
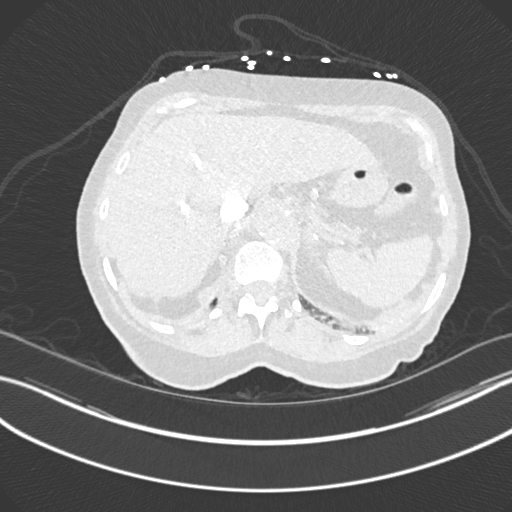
[im 111/441  mediastinal]
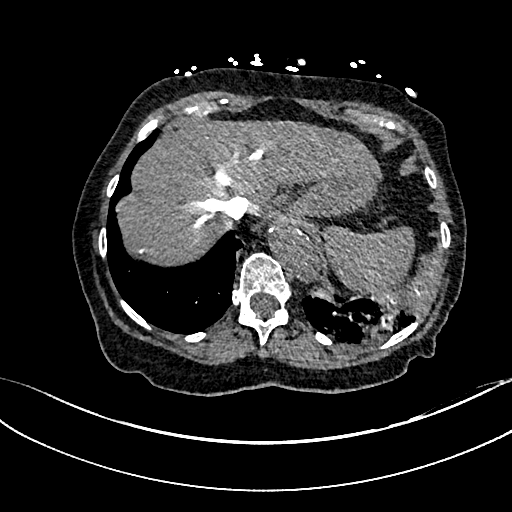
[im 138/441  lung]
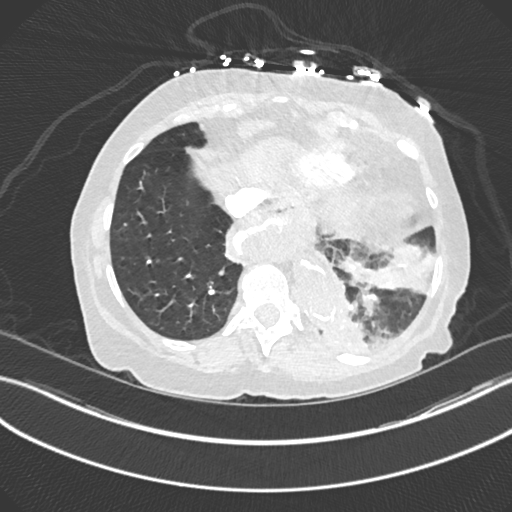
[im 166/441  mediastinal]
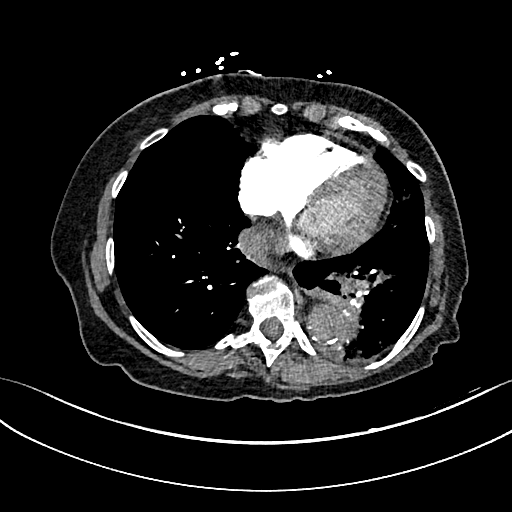
[im 193/441  lung]
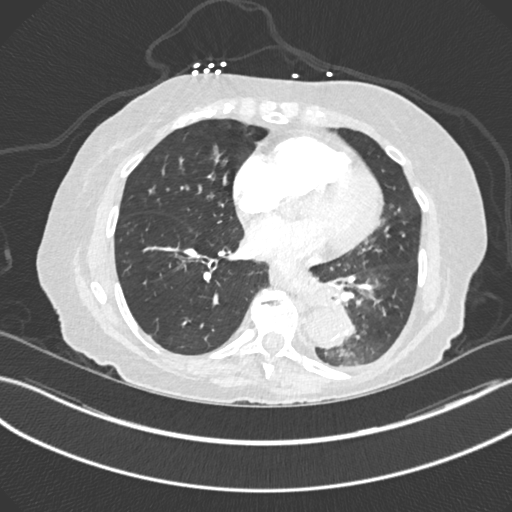
[im 221/441  mediastinal]
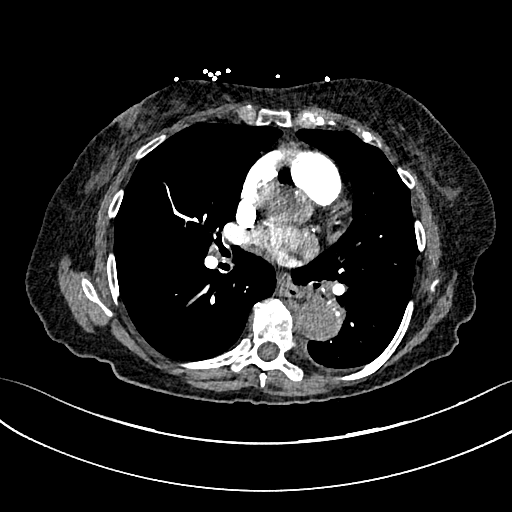
[im 248/441  lung]
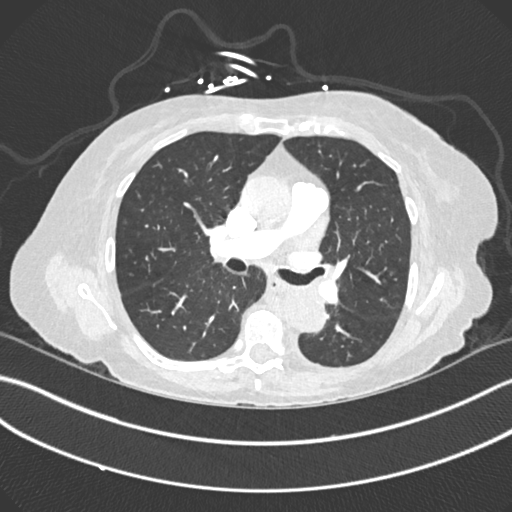
[im 276/441  mediastinal]
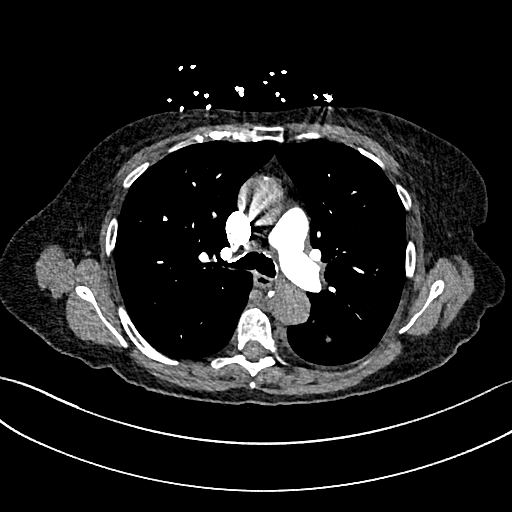
[im 303/441  lung]
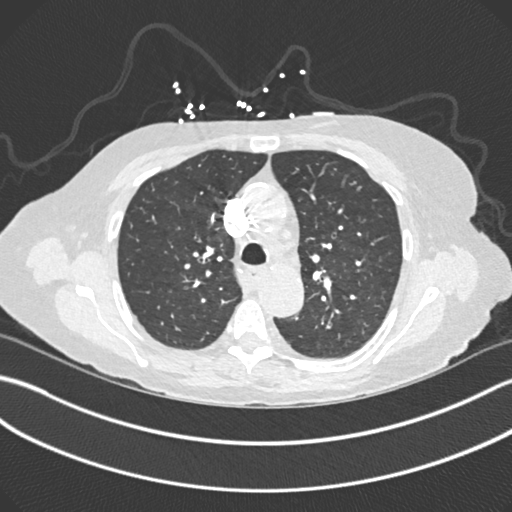
[im 331/441  mediastinal]
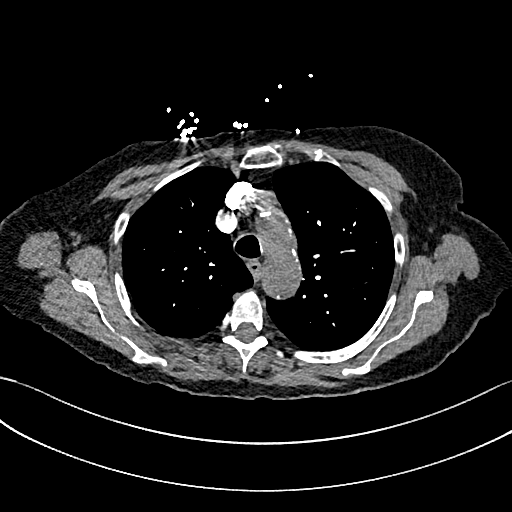
[im 358/441  lung]
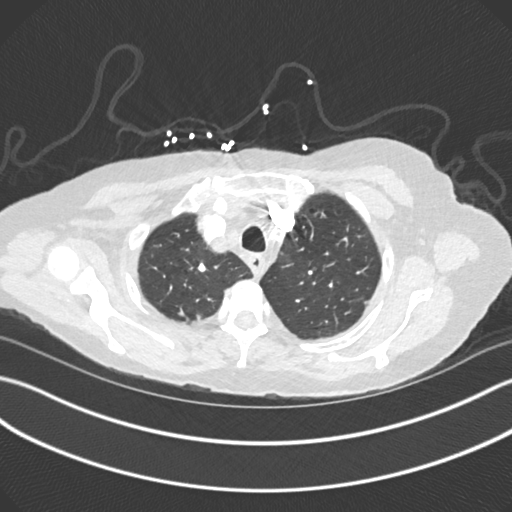
[im 386/441  mediastinal]
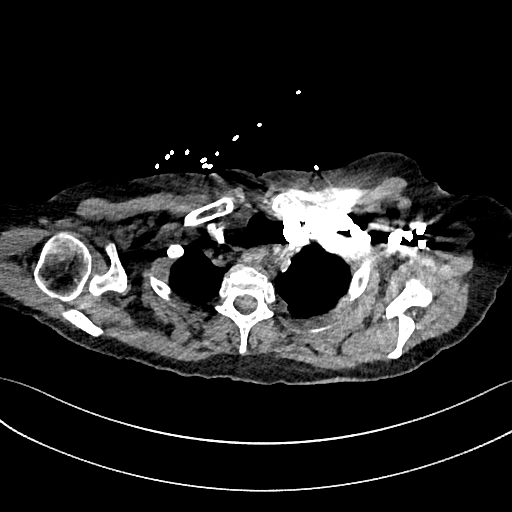
[im 413/441  lung]
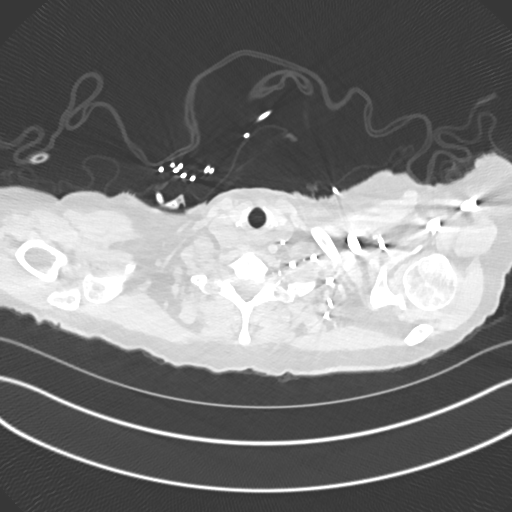

[Series 7: pe lung · axial · 0.59mm/px · z∈[+1089,+1157]mm · 2 of 138 slices shown]
[im 35/138  mediastinal]
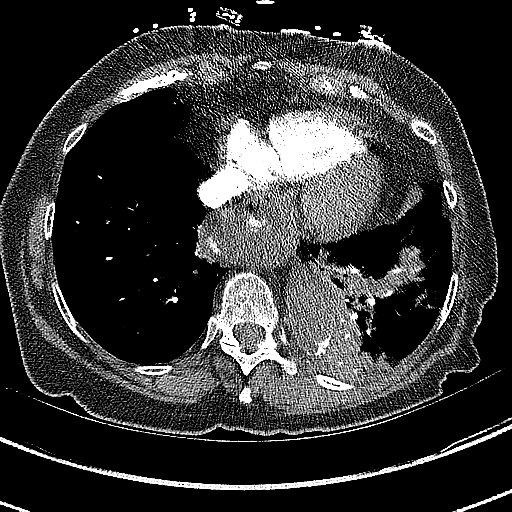
[im 69/138  mediastinal]
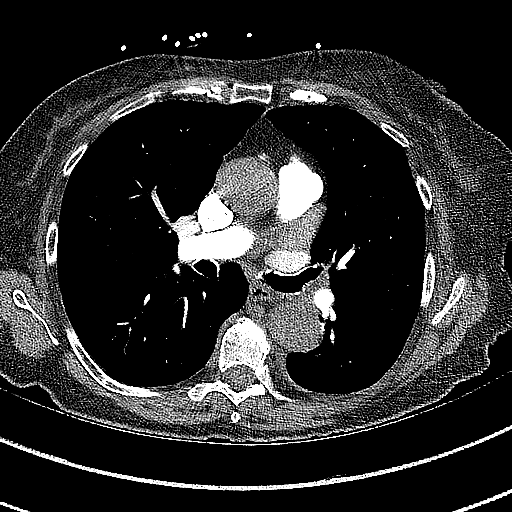

[Series 8: pe 2mm cor · coronal · 0.63mm/px · 1 of 148 slices shown]
[im 74/148  mediastinal]
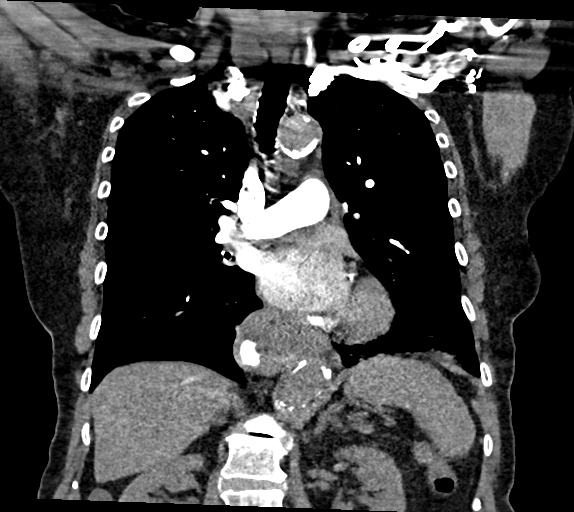

[18 of 36 positions shown; findings below may reference images not displayed]

FINDINGS: Cardiovascular: There is atherosclerotic calcification of the
coronary arteries. Heart size is normal. No pericardial effusion.
Pulmonary arteries are well opacified. There is no acute pulmonary
embolus. Significant atherosclerosis of the thoracic aorta. No
aneurysm.

Mediastinum/Nodes: The visualized portion of the thyroid gland has a
normal appearance. Moderate hiatal hernia. Otherwise the esophagus
is unremarkable. No significant mediastinal, hilar, or axillary
adenopathy.

Lungs/Pleura: There are patchy airspace filling opacities and
consolidation within the LEFT LOWER lobe. Soft tissue density within
the LEFT LOWER lobe bronchus may represent secretions or infection.
Follow-up is recommended to document clearing.

Upper Abdomen: Status post cholecystectomy. Nodular, rounded surface
of the liver consistent with cirrhosis. There is atherosclerotic
calcification of the abdominal aorta.

Musculoskeletal: Scoliosis. Midthoracic degenerative changes. Mild
anterior wedge deformities of the T6, T7, T8 consistent with remote
compression fractures and resulting in focal kyphosis. No lytic or
blastic lesions.

Review of the MIP images confirms the above findings.
IMPRESSION: 1. Technically adequate exam showing no acute pulmonary embolus.
2. Coronary artery disease.
3. Moderate hiatal hernia.
4. LEFT LOWER lobe infiltrate, likely infectious. Soft tissue
density within the LEFT LOWER lobe bronchus, favoring secretions or
infection. Follow-up chest x-ray is recommended to document
resolution of LEFT LOWER lobe opacity.
5. Cirrhotic morphology of the liver.
6. Remote compression fractures of T6, T7, T8.
7. Aortic Atherosclerosis ([id number].F).

## 2022-04-15 DIAGNOSIS — J988 Other specified respiratory disorders: Secondary | ICD-10-CM | POA: Insufficient documentation

## 2022-04-15 DIAGNOSIS — M79671 Pain in right foot: Secondary | ICD-10-CM | POA: Insufficient documentation

## 2022-05-13 DIAGNOSIS — I351 Nonrheumatic aortic (valve) insufficiency: Secondary | ICD-10-CM | POA: Diagnosis not present

## 2022-05-13 DIAGNOSIS — I340 Nonrheumatic mitral (valve) insufficiency: Secondary | ICD-10-CM

## 2022-05-13 DIAGNOSIS — I361 Nonrheumatic tricuspid (valve) insufficiency: Secondary | ICD-10-CM

## 2022-05-13 DIAGNOSIS — I272 Pulmonary hypertension, unspecified: Secondary | ICD-10-CM

## 2022-05-15 DIAGNOSIS — I4891 Unspecified atrial fibrillation: Secondary | ICD-10-CM | POA: Diagnosis not present

## 2022-06-02 ENCOUNTER — Other Ambulatory Visit: Payer: Self-pay

## 2022-06-02 DIAGNOSIS — I10 Essential (primary) hypertension: Secondary | ICD-10-CM | POA: Insufficient documentation

## 2022-06-19 ENCOUNTER — Other Ambulatory Visit: Payer: Self-pay

## 2022-06-29 ENCOUNTER — Encounter: Payer: Self-pay | Admitting: Cardiology

## 2022-06-29 ENCOUNTER — Ambulatory Visit: Payer: Medicare Other | Attending: Cardiology | Admitting: Cardiology

## 2022-06-29 VITALS — BP 156/78 | HR 72 | Ht <= 58 in | Wt 104.2 lb

## 2022-06-29 DIAGNOSIS — I2510 Atherosclerotic heart disease of native coronary artery without angina pectoris: Secondary | ICD-10-CM

## 2022-06-29 DIAGNOSIS — F17210 Nicotine dependence, cigarettes, uncomplicated: Secondary | ICD-10-CM | POA: Insufficient documentation

## 2022-06-29 DIAGNOSIS — F17200 Nicotine dependence, unspecified, uncomplicated: Secondary | ICD-10-CM | POA: Diagnosis not present

## 2022-06-29 DIAGNOSIS — E782 Mixed hyperlipidemia: Secondary | ICD-10-CM | POA: Diagnosis not present

## 2022-06-29 DIAGNOSIS — I480 Paroxysmal atrial fibrillation: Secondary | ICD-10-CM | POA: Diagnosis not present

## 2022-06-29 DIAGNOSIS — I10 Essential (primary) hypertension: Secondary | ICD-10-CM | POA: Diagnosis not present

## 2022-06-29 HISTORY — DX: Paroxysmal atrial fibrillation: I48.0

## 2022-06-29 HISTORY — DX: Atherosclerotic heart disease of native coronary artery without angina pectoris: I25.10

## 2022-06-29 NOTE — Progress Notes (Signed)
Cardiology Office Note:    Date:  06/29/2022   ID:  Heather Molina, DOB [birth year], MRN [id number]  PCP:  Hadley Pen, MD  Cardiologist:  Garwin Brothers, MD   Referring MD: Hadley Pen, MD    ASSESSMENT:    1. Essential hypertension   2. Paroxysmal atrial fibrillation (HCC)   3. Mixed dyslipidemia   4. TOBACCO ABUSE   5. Coronary artery disease involving native coronary artery of native heart without angina pectoris    PLAN:    In order of problems listed above:  Coronary artery calcification: Secondary prevention stressed with the patient.  Importance of compliance with diet medication stressed and she vocalized understanding. Cigarette smoker: I spent 5 minutes with the patient discussing solely about smoking. Smoking cessation was counseled. I suggested to the patient also different medications and pharmacological interventions. Patient is keen to try stopping on its own at this time. He will get back to me if he needs any further assistance in this matter. Paroxysmal atrial fibrillation:I discussed with the patient atrial fibrillation, disease process. Management and therapy including rate and rhythm control, anticoagulation benefits and potential risks were discussed extensively with the patient. Patient had multiple questions which were answered to patient's satisfaction. Mixed dyslipidemia: On lipid-lowering medications followed by primary care.  Diet emphasized. COPD on home oxygen: Managed by primary care. Patient will be seen in follow-up appointment in 6 months or earlier if the patient has any concerns.    Medication Adjustments/Labs and Tests Ordered: Current medicines are reviewed at length with the patient today.  Concerns regarding medicines are outlined above.  No orders of the defined types were placed in this encounter.  No orders of the defined types were placed in this encounter.    History of Present Illness:    Heather Molina is a 82 y.o.  female who is being seen today for the evaluation of paroxysmal atrial fibrillation at the request of Hadley Pen, MD. patient is a pleasant 82 year old female.  She has past medical history of advanced COPD on supplemental oxygen.  She has a history of coronary artery calcification and aortic calcification and atherosclerosis.  She was diagnosed approximately fibrillation at the hospital.  Have not seen the EKG but the cardiologist has documented paroxysmal atrial fibrillation.  She has history of hypertension dyslipidemia.  Unfortunately she continues to smoke.  She is frail.  At the time of my evaluation, the patient is alert awake oriented and in no distress.   Past Medical History:  Diagnosis Date   Acute foot pain, right 04/15/2022   Age-related osteoporosis without current pathological fracture 04/06/2018   ALLERGIC RHINITIS 05/20/2009   Qualifier: Diagnosis of   By: Excell Seltzer CMA, Lawson Fiscal      Replacing diagnoses that were inactivated after the 05/25/22 regulatory import   Anxiety    Arthritis, multiple joint involvement 04/06/2018   C O P D 05/20/2009   Qualifier: Diagnosis of   By: Excell Seltzer CMA, Lori      Replacing diagnoses that were inactivated after the 05/25/22 regulatory import   Calculus of gallbladder with chronic cholecystitis without obstruction 04/30/2016   CAP (community acquired pneumonia) 05/13/2019   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/26/2020   Cigarette nicotine dependence without complication 12/26/2020   Cough 05/20/2009   Centricity Description: COUGH  Qualifier: Diagnosis of   By: Excell Seltzer CMA, Lawson Fiscal      Centricity Description: COUGH DUE TO ACE INHIBITORS  Qualifier: Diagnosis  of   By: Craige Cotta MD, Vineet       Elevated troponin 05/13/2019   Essential hypertension 05/13/2019   G E R D 05/20/2009   Qualifier: Diagnosis of   By: Excell Seltzer CMA, Lori       Generalized edema 08/08/2019   Hypertension    Hypoxia 05/13/2019   Irritable bowel syndrome with  diarrhea 09/27/2019   Leukocytosis 05/13/2019   Lipoma of torso 04/07/2019   Mixed dyslipidemia 04/06/2018   NSVT (nonsustained ventricular tachycardia) (HCC) 05/13/2019   Prolonged Q-T interval on ECG 05/13/2019   Recurrent major depressive disorder, in full remission (HCC) 04/06/2018   Respiratory infection 04/15/2022   Right inguinal hernia 03/02/2018   Sebaceous cyst 04/07/2019   TOBACCO ABUSE 05/21/2009   Qualifier: Diagnosis of   By: Craige Cotta MD, Vineet        Past Surgical History:  Procedure Laterality Date   CHOLECYSTECTOMY     HERNIA REPAIR      Current Medications: Current Meds  Medication Sig   aspirin 81 MG EC tablet Take 81 mg by mouth daily.   benzonatate (TESSALON) 200 MG capsule Take 200 mg by mouth 3 (three) times daily as needed for cough.   BREZTRI AEROSPHERE 160-9-4.8 MCG/ACT AERO Inhale 2 puffs into the lungs in the morning and at bedtime.   diphenoxylate-atropine (LOMOTIL) 2.5-0.025 MG tablet Take 1 tablet by mouth 4 (four) times daily as needed for diarrhea or loose stools.   ELIQUIS 2.5 MG TABS tablet Take 2.5 mg by mouth 2 (two) times daily.   metoprolol tartrate (LOPRESSOR) 25 MG tablet Take 12.5 mg by mouth 2 (two) times daily.   PARoxetine (PAXIL) 20 MG tablet Take 20 mg by mouth daily.   pravastatin (PRAVACHOL) 40 MG tablet Take 40 mg by mouth daily.     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     Social Determinants of Health   Financial Resource Strain: Not on file  Food Insecurity: Not on file  Transportation Needs: Not on file  Physical Activity: Not on file  Stress: Not on file  Social Connections: Not on file     Family History: The patient's family history includes CVA in her father; Epilepsy in her father.  ROS:   Please see the history of present illness.    All other systems reviewed and are negative.  EKGs/Labs/Other Studies Reviewed:    The following studies were reviewed today: EKG reveals sinus rhythm with LVH and nonspecific ST-T changes.   Recent Labs: No results found for requested labs within last 365 days.  Recent Lipid Panel No results found for: "CHOL", "TRIG", "HDL", "CHOLHDL", "VLDL", "LDLCALC", "LDLDIRECT"  Physical Exam:    VS:  BP (!) 156/78   Pulse 72   Ht 4\' 9"  (1.448 m)   Wt 104 lb  3.2 oz (47.3 kg)   SpO2 92%   BMI 22.55 kg/m     Wt Readings from Last 3 Encounters:  06/29/22 104 lb 3.2 oz (47.3 kg)  05/13/19 115 lb 8.3 oz (52.4 kg)     GEN: Patient is in no acute distress HEENT: Normal NECK: No JVD; No carotid bruits LYMPHATICS: No lymphadenopathy CARDIAC: S1 S2 regular, 2/6 systolic murmur at the apex. RESPIRATORY:  Clear to auscultation without rales, wheezing or rhonchi.  Distant air sounds. ABDOMEN: Soft, non-tender, non-distended MUSCULOSKELETAL:  No edema; No deformity  SKIN: Warm and dry NEUROLOGIC:  Alert and oriented x 3 PSYCHIATRIC:  Normal affect    Signed, Garwin Brothers, MD  06/29/2022 4:15 PM     [facility]

## 2022-06-29 NOTE — Patient Instructions (Signed)
Medication Instructions:  Your physician recommends that you continue on your current medications as directed. Please refer to the Current Medication list given to you today.  *If you need a refill on your cardiac medications before your next appointment, please call your pharmacy*   Lab Work: None ordered If you have labs (blood work) drawn today and your tests are completely normal, you will receive your results only by: MyChart Message (if you have MyChart) OR A paper copy in the mail If you have any lab test that is abnormal or we need to change your treatment, we will call you to review the results.   Testing/Procedures: None ordered   Follow-Up: At [facility], you and your health needs are our priority.  As part of our continuing mission to provide you with exceptional heart care, we have created designated Provider Care Teams.  These Care Teams include your primary Cardiologist (physician) and Advanced Practice Providers (APPs -  Physician Assistants and Nurse Practitioners) who all work together to provide you with the care you need, when you need it.  We recommend signing up for the patient portal called "MyChart".  Sign up information is provided on this After Visit Summary.  MyChart is used to connect with patients for Virtual Visits (Telemedicine).  Patients are able to view lab/test results, encounter notes, upcoming appointments, etc.  Non-urgent messages can be sent to your provider as well.   To learn more about what you can do with MyChart, go to [email or web address].    Your next appointment:   6 month(s)  The format for your next appointment:   In Person  Provider:   Rajan Revankar, MD    Other Instructions none  Important Information About Sugar       

## 2022-08-13 ENCOUNTER — Telehealth: Payer: Self-pay | Admitting: Cardiology

## 2022-08-13 NOTE — Telephone Encounter (Signed)
Pt c/o medication issue:  1. Name of Medication:  ELIQUIS 2.5 MG TABS tablet    aspirin 81 MG EC tablet    2. How are you currently taking this medication (dosage and times per day)?    3. Are you having a reaction (difficulty breathing--STAT)? no  4. What is your medication issue? Daughter thinks patient the patient is having a allegric reaction to the medication. She states that the patient is starting to bruise easy. Please advise

## 2022-08-13 NOTE — Telephone Encounter (Signed)
Spoke with [name] per DPR who is concerned with the amount of bruising the pt has. Advised that this because she is on blood thinners. [name] is also concerned with the cost of the Eliquis. Advised to apply for pt assistance as the pt should qualify with her income. [name] would like to know about Pradax if it is an option as the cost is cheaper. [name] stated that the pt will get fedup with all of this and stop it. Advised that she is very high risk for strokes as she goes in and out of atrial flutter and she has already had a stroke. Tracey verbalized understanding and had no additional questions.

## 2022-08-30 ENCOUNTER — Emergency Department (HOSPITAL_COMMUNITY): Payer: Medicare Other

## 2022-08-30 ENCOUNTER — Encounter (HOSPITAL_COMMUNITY): Payer: Self-pay

## 2022-08-30 ENCOUNTER — Other Ambulatory Visit: Payer: Self-pay

## 2022-08-30 ENCOUNTER — Inpatient Hospital Stay (HOSPITAL_COMMUNITY)
Admission: EM | Admit: 2022-08-30 | Discharge: 2022-09-01 | DRG: 871 | Disposition: A | Discharge status: Home Health | Payer: Medicare Other | Attending: Internal Medicine | Admitting: Internal Medicine

## 2022-08-30 DIAGNOSIS — I480 Paroxysmal atrial fibrillation: Secondary | ICD-10-CM | POA: Diagnosis not present

## 2022-08-30 DIAGNOSIS — Z8673 Personal history of transient ischemic attack (TIA), and cerebral infarction without residual deficits: Secondary | ICD-10-CM | POA: Diagnosis not present

## 2022-08-30 DIAGNOSIS — R6520 Severe sepsis without septic shock: Secondary | ICD-10-CM | POA: Diagnosis present

## 2022-08-30 DIAGNOSIS — Z1152 Encounter for screening for COVID-19: Secondary | ICD-10-CM

## 2022-08-30 DIAGNOSIS — K219 Gastro-esophageal reflux disease without esophagitis: Secondary | ICD-10-CM | POA: Diagnosis present

## 2022-08-30 DIAGNOSIS — E782 Mixed hyperlipidemia: Secondary | ICD-10-CM | POA: Diagnosis present

## 2022-08-30 DIAGNOSIS — J9621 Acute and chronic respiratory failure with hypoxia: Secondary | ICD-10-CM

## 2022-08-30 DIAGNOSIS — J441 Chronic obstructive pulmonary disease with (acute) exacerbation: Secondary | ICD-10-CM

## 2022-08-30 DIAGNOSIS — Z820 Family history of epilepsy and other diseases of the nervous system: Secondary | ICD-10-CM

## 2022-08-30 DIAGNOSIS — I10 Essential (primary) hypertension: Secondary | ICD-10-CM | POA: Diagnosis present

## 2022-08-30 DIAGNOSIS — R0609 Other forms of dyspnea: Secondary | ICD-10-CM | POA: Diagnosis not present

## 2022-08-30 DIAGNOSIS — I4729 Other ventricular tachycardia: Secondary | ICD-10-CM | POA: Diagnosis present

## 2022-08-30 DIAGNOSIS — F419 Anxiety disorder, unspecified: Secondary | ICD-10-CM | POA: Diagnosis present

## 2022-08-30 DIAGNOSIS — Z881 Allergy status to other antibiotic agents status: Secondary | ICD-10-CM

## 2022-08-30 DIAGNOSIS — J189 Pneumonia, unspecified organism: Secondary | ICD-10-CM | POA: Diagnosis present

## 2022-08-30 DIAGNOSIS — K580 Irritable bowel syndrome with diarrhea: Secondary | ICD-10-CM

## 2022-08-30 DIAGNOSIS — Z7901 Long term (current) use of anticoagulants: Secondary | ICD-10-CM

## 2022-08-30 DIAGNOSIS — I2510 Atherosclerotic heart disease of native coronary artery without angina pectoris: Secondary | ICD-10-CM | POA: Diagnosis present

## 2022-08-30 DIAGNOSIS — Z79899 Other long term (current) drug therapy: Secondary | ICD-10-CM | POA: Diagnosis not present

## 2022-08-30 DIAGNOSIS — J690 Pneumonitis due to inhalation of food and vomit: Secondary | ICD-10-CM | POA: Diagnosis not present

## 2022-08-30 DIAGNOSIS — A419 Sepsis, unspecified organism: Principal | ICD-10-CM | POA: Diagnosis present

## 2022-08-30 DIAGNOSIS — M810 Age-related osteoporosis without current pathological fracture: Secondary | ICD-10-CM | POA: Diagnosis present

## 2022-08-30 DIAGNOSIS — F3342 Major depressive disorder, recurrent, in full remission: Secondary | ICD-10-CM | POA: Diagnosis not present

## 2022-08-30 DIAGNOSIS — Z7951 Long term (current) use of inhaled steroids: Secondary | ICD-10-CM

## 2022-08-30 DIAGNOSIS — R7989 Other specified abnormal findings of blood chemistry: Secondary | ICD-10-CM

## 2022-08-30 DIAGNOSIS — Z7982 Long term (current) use of aspirin: Secondary | ICD-10-CM

## 2022-08-30 DIAGNOSIS — R9431 Abnormal electrocardiogram [ECG] [EKG]: Secondary | ICD-10-CM | POA: Diagnosis present

## 2022-08-30 DIAGNOSIS — Z888 Allergy status to other drugs, medicaments and biological substances status: Secondary | ICD-10-CM | POA: Diagnosis not present

## 2022-08-30 DIAGNOSIS — F32A Depression, unspecified: Secondary | ICD-10-CM | POA: Diagnosis present

## 2022-08-30 DIAGNOSIS — E119 Type 2 diabetes mellitus without complications: Secondary | ICD-10-CM | POA: Diagnosis present

## 2022-08-30 DIAGNOSIS — Z823 Family history of stroke: Secondary | ICD-10-CM

## 2022-08-30 DIAGNOSIS — J440 Chronic obstructive pulmonary disease with acute lower respiratory infection: Secondary | ICD-10-CM | POA: Diagnosis present

## 2022-08-30 DIAGNOSIS — J9601 Acute respiratory failure with hypoxia: Principal | ICD-10-CM

## 2022-08-30 DIAGNOSIS — I472 Ventricular tachycardia, unspecified: Secondary | ICD-10-CM | POA: Diagnosis present

## 2022-08-30 DIAGNOSIS — Z87891 Personal history of nicotine dependence: Secondary | ICD-10-CM

## 2022-08-30 HISTORY — DX: Acute and chronic respiratory failure with hypoxia: J96.21

## 2022-08-30 HISTORY — DX: Chronic obstructive pulmonary disease with (acute) exacerbation: J44.1

## 2022-08-30 HISTORY — DX: Other specified abnormal findings of blood chemistry: R79.89

## 2022-08-30 LAB — RESPIRATORY PANEL BY PCR

## 2022-08-30 LAB — LACTIC ACID, PLASMA
Lactic Acid, Venous: 3.3 mmol/L (ref 0.5–1.9)
Lactic Acid, Venous: 4.1 mmol/L (ref 0.5–1.9)
Lactic Acid, Venous: 2.8 mmol/L (ref 0.5–1.9)

## 2022-08-30 LAB — CBC WITH DIFFERENTIAL/PLATELET
Abs Immature Granulocytes: 0 10*3/uL (ref 0.00–0.07)
Basophils Absolute: 0.4 10*3/uL — ABNORMAL HIGH (ref 0.0–0.1)
Basophils Relative: 3 %
Eosinophils Absolute: 1.2 10*3/uL — ABNORMAL HIGH (ref 0.0–0.5)
Eosinophils Relative: 8 %
HCT: 39.6 % (ref 36.0–46.0)
Hemoglobin: 12.2 g/dL (ref 12.0–15.0)
Lymphocytes Relative: 35 %
Lymphs Abs: 5.2 10*3/uL — ABNORMAL HIGH (ref 0.7–4.0)
MCH: 30.4 pg (ref 26.0–34.0)
MCHC: 30.8 g/dL (ref 30.0–36.0)
MCV: 98.8 fL (ref 80.0–100.0)
Monocytes Absolute: 1 10*3/uL (ref 0.1–1.0)
Monocytes Relative: 7 %
Neutro Abs: 7 10*3/uL (ref 1.7–7.7)
Neutrophils Relative %: 47 %
Platelets: 201 10*3/uL (ref 150–400)
RBC: 4.01 MIL/uL (ref 3.87–5.11)
RDW: 13.3 % (ref 11.5–15.5)
WBC: 14.9 10*3/uL — ABNORMAL HIGH (ref 4.0–10.5)
nRBC: 0 % (ref 0.0–0.2)
nRBC: 0 /100 WBC

## 2022-08-30 LAB — BRAIN NATRIURETIC PEPTIDE: B Natriuretic Peptide: 770.7 pg/mL — ABNORMAL HIGH (ref 0.0–100.0)

## 2022-08-30 LAB — COMPREHENSIVE METABOLIC PANEL
ALT: 20 U/L (ref 0–44)
AST: 47 U/L — ABNORMAL HIGH (ref 15–41)
Albumin: 3.3 g/dL — ABNORMAL LOW (ref 3.5–5.0)
Alkaline Phosphatase: 79 U/L (ref 38–126)
Anion gap: 14 (ref 5–15)
BUN: 19 mg/dL (ref 8–23)
CO2: 26 mmol/L (ref 22–32)
Calcium: 8.7 mg/dL — ABNORMAL LOW (ref 8.9–10.3)
Chloride: 100 mmol/L (ref 98–111)
Creatinine, Ser: 1.25 mg/dL — ABNORMAL HIGH (ref 0.44–1.00)
GFR, Estimated: 43 mL/min — ABNORMAL LOW (ref >60)
Glucose, Bld: 249 mg/dL — ABNORMAL HIGH (ref 70–99)
Potassium: 5 mmol/L (ref 3.5–5.1)
Sodium: 140 mmol/L (ref 135–145)
Total Bilirubin: 0.7 mg/dL (ref 0.3–1.2)
Total Protein: 6.3 g/dL — ABNORMAL LOW (ref 6.5–8.1)

## 2022-08-30 LAB — RESP PANEL BY RT-PCR (RSV, FLU A&B, COVID)  RVPGX2
Influenza A by PCR: NEGATIVE
Influenza B by PCR: NEGATIVE
Resp Syncytial Virus by PCR: NEGATIVE
SARS Coronavirus 2 by RT PCR: NEGATIVE

## 2022-08-30 LAB — TROPONIN I (HIGH SENSITIVITY)
Troponin I (High Sensitivity): 65 ng/L — ABNORMAL HIGH (ref <18)
Troponin I (High Sensitivity): 258 ng/L (ref <18)
Troponin I (High Sensitivity): 382 ng/L (ref <18)
Troponin I (High Sensitivity): 433 ng/L (ref <18)

## 2022-08-30 LAB — MRSA NEXT GEN BY PCR, NASAL: MRSA by PCR Next Gen: NOT DETECTED

## 2022-08-30 LAB — CBG MONITORING, ED: Glucose-Capillary: 222 mg/dL — ABNORMAL HIGH (ref 70–99)

## 2022-08-30 LAB — PROCALCITONIN: Procalcitonin: <0.1 ng/mL

## 2022-08-30 LAB — MAGNESIUM: Magnesium: 2.7 mg/dL — ABNORMAL HIGH (ref 1.7–2.4)

## 2022-08-30 MED ORDER — ASPIRIN 81 MG PO TBEC
81.0000 mg | DELAYED_RELEASE_TABLET | Freq: Every day | ORAL | Status: DC
  Administered 2022-08-31 – 2022-09-01 (×2): 81 mg via ORAL
  Filled 2022-08-30 (×2): qty 1

## 2022-08-30 MED ORDER — FUROSEMIDE 10 MG/ML IJ SOLN
20.0000 mg | Freq: Two times a day (BID) | INTRAMUSCULAR | Status: DC
  Administered 2022-08-30 – 2022-08-31 (×3): 20 mg via INTRAVENOUS
  Filled 2022-08-30 (×3): qty 2

## 2022-08-30 MED ORDER — ALBUTEROL SULFATE (2.5 MG/3ML) 0.083% IN NEBU: 2.5000 mg | INHALATION_SOLUTION | RESPIRATORY_TRACT | Status: DC | PRN

## 2022-08-30 MED ORDER — LABETALOL HCL 5 MG/ML IV SOLN: 5.0000 mg | Freq: Once | INTRAVENOUS | Status: DC

## 2022-08-30 MED ORDER — BUDESON-GLYCOPYRROL-FORMOTEROL 160-9-4.8 MCG/ACT IN AERO: 2.0000 | INHALATION_SPRAY | Freq: Two times a day (BID) | RESPIRATORY_TRACT | Status: DC

## 2022-08-30 MED ORDER — AMIODARONE HCL IN DEXTROSE 360-4.14 MG/200ML-% IV SOLN
60.0000 mg/h | INTRAVENOUS | Status: AC
  Administered 2022-08-30: 60 mg/h via INTRAVENOUS
  Filled 2022-08-30: qty 200

## 2022-08-30 MED ORDER — DIPHENOXYLATE-ATROPINE 2.5-0.025 MG PO TABS
1.0000 | ORAL_TABLET | Freq: Four times a day (QID) | ORAL | Status: DC | PRN
  Administered 2022-08-31: 1 via ORAL
  Filled 2022-08-30: qty 1

## 2022-08-30 MED ORDER — VANCOMYCIN HCL IN DEXTROSE 1-5 GM/200ML-% IV SOLN
1000.0000 mg | Freq: Once | INTRAVENOUS | Status: AC
  Administered 2022-08-30: 1000 mg via INTRAVENOUS
  Filled 2022-08-30: qty 200

## 2022-08-30 MED ORDER — LACTATED RINGERS IV BOLUS
1000.0000 mL | Freq: Once | INTRAVENOUS | Status: AC
  Administered 2022-08-30: 1000 mL via INTRAVENOUS

## 2022-08-30 MED ORDER — AMIODARONE LOAD VIA INFUSION
150.0000 mg | Freq: Once | INTRAVENOUS | Status: AC
  Administered 2022-08-30: 150 mg via INTRAVENOUS
  Filled 2022-08-30: qty 83.34

## 2022-08-30 MED ORDER — AMIODARONE HCL IN DEXTROSE 360-4.14 MG/200ML-% IV SOLN
30.0000 mg/h | INTRAVENOUS | Status: AC
  Administered 2022-08-30 – 2022-08-31 (×2): 30 mg/h via INTRAVENOUS
  Filled 2022-08-30 (×2): qty 200

## 2022-08-30 MED ORDER — VANCOMYCIN HCL 750 MG/150ML IV SOLN: 750.0000 mg | INTRAVENOUS | Status: DC

## 2022-08-30 MED ORDER — ORAL CARE MOUTH RINSE: 15.0000 mL | OROMUCOSAL | Status: DC | PRN

## 2022-08-30 MED ORDER — APIXABAN 2.5 MG PO TABS
2.5000 mg | ORAL_TABLET | Freq: Two times a day (BID) | ORAL | Status: DC
  Administered 2022-08-30 – 2022-09-01 (×4): 2.5 mg via ORAL
  Filled 2022-08-30 (×4): qty 1

## 2022-08-30 MED ORDER — IPRATROPIUM BROMIDE 0.02 % IN SOLN
0.5000 mg | Freq: Four times a day (QID) | RESPIRATORY_TRACT | Status: DC
  Administered 2022-08-30 – 2022-08-31 (×3): 0.5 mg via RESPIRATORY_TRACT
  Filled 2022-08-30 (×3): qty 2.5

## 2022-08-30 MED ORDER — METOPROLOL TARTRATE 5 MG/5ML IV SOLN
5.0000 mg | INTRAVENOUS | Status: AC
  Administered 2022-08-30: 5 mg via INTRAVENOUS
  Filled 2022-08-30: qty 5

## 2022-08-30 MED ORDER — LEVALBUTEROL HCL 0.63 MG/3ML IN NEBU: 0.6300 mg | INHALATION_SOLUTION | Freq: Four times a day (QID) | RESPIRATORY_TRACT | Status: DC | PRN

## 2022-08-30 MED ORDER — PAROXETINE HCL 20 MG PO TABS
20.0000 mg | ORAL_TABLET | Freq: Every day | ORAL | Status: DC
  Administered 2022-08-31 – 2022-09-01 (×2): 20 mg via ORAL
  Filled 2022-08-30 (×2): qty 1

## 2022-08-30 MED ORDER — LEVOFLOXACIN IN D5W 750 MG/150ML IV SOLN
750.0000 mg | Freq: Once | INTRAVENOUS | Status: AC
  Administered 2022-08-30: 750 mg via INTRAVENOUS
  Filled 2022-08-30: qty 150

## 2022-08-30 MED ORDER — UMECLIDINIUM BROMIDE 62.5 MCG/ACT IN AEPB
1.0000 | INHALATION_SPRAY | Freq: Every day | RESPIRATORY_TRACT | Status: DC
  Administered 2022-08-31 – 2022-09-01 (×2): 1 via RESPIRATORY_TRACT
  Filled 2022-08-30: qty 7

## 2022-08-30 MED ORDER — METOPROLOL TARTRATE 12.5 MG HALF TABLET: 12.5000 mg | ORAL_TABLET | Freq: Two times a day (BID) | ORAL | Status: DC

## 2022-08-30 MED ORDER — MOMETASONE FURO-FORMOTEROL FUM 200-5 MCG/ACT IN AERO
2.0000 | INHALATION_SPRAY | Freq: Two times a day (BID) | RESPIRATORY_TRACT | Status: DC
  Administered 2022-08-31 – 2022-09-01 (×3): 2 via RESPIRATORY_TRACT
  Filled 2022-08-30: qty 8.8

## 2022-08-30 MED ORDER — SODIUM CHLORIDE 0.9% FLUSH
3.0000 mL | Freq: Two times a day (BID) | INTRAVENOUS | Status: DC
  Administered 2022-08-30 – 2022-09-01 (×5): 3 mL via INTRAVENOUS

## 2022-08-30 MED ORDER — IPRATROPIUM BROMIDE 0.02 % IN SOLN: 0.5000 mg | Freq: Four times a day (QID) | RESPIRATORY_TRACT | Status: DC

## 2022-08-30 MED ORDER — ACETAMINOPHEN 650 MG RE SUPP: 650.0000 mg | Freq: Four times a day (QID) | RECTAL | Status: DC | PRN

## 2022-08-30 MED ORDER — METOPROLOL TARTRATE 12.5 MG HALF TABLET
12.5000 mg | ORAL_TABLET | Freq: Two times a day (BID) | ORAL | Status: DC
  Administered 2022-08-30 – 2022-09-01 (×4): 12.5 mg via ORAL
  Filled 2022-08-30 (×4): qty 1

## 2022-08-30 MED ORDER — ACETAMINOPHEN 325 MG PO TABS: 650.0000 mg | ORAL_TABLET | Freq: Four times a day (QID) | ORAL | Status: DC | PRN

## 2022-08-30 MED ORDER — PRAVASTATIN SODIUM 40 MG PO TABS
40.0000 mg | ORAL_TABLET | Freq: Every day | ORAL | Status: DC
  Administered 2022-08-31 – 2022-09-01 (×2): 40 mg via ORAL
  Filled 2022-08-30 (×2): qty 1

## 2022-08-30 NOTE — ED Notes (Signed)
lactic 4.1. trop 243 . MD made aware.

## 2022-08-30 NOTE — Progress Notes (Signed)
RT took patient off bipap. Patient tolerating well at this time. No distress noted on 3L [place]. MD and RN aware. RT will continue to monitor as needed.

## 2022-08-30 NOTE — H&P (Addendum)
History and Physical   Heather Molina ZOX:[id number] DOB: [birth year] DOA: 08/30/2022  PCP: Hadley Pen, MD   Patient coming from: Home  Chief Complaint: Shortness of breath  HPI: Heather Molina is a 82 y.o. female with medical history significant of A-fib, NSVT, hypertension, hyperlipidemia, GERD, stroke, COPD, CAD, depression, anxiety, prolonged QTc, IBS, chronic respiratory failure with hypoxia presenting with shortness of breath.  History obtained with assistance of chart review and family as patient has little memory of this morning.  Patient began to feel short of breath earlier today.  She called her daughter this morning and told her that she felt unwell and short of breath.  Patient's daughter came out to help her.  Patient does use 1 L of supplemental oxygen at baseline and daughter increase this but patient remains short of breath and began to look "purple ". EMS was called to the house and patient was saturating in the 30% range and was placed on a nonrebreather, she received 5 nebulizer treatments, 125 mg Solu-Medrol, 2 mg IV magnesium, epi and was transported to the ED.  Patient denies fevers, chills, chest pain, abdominal pain, constipation, diarrhea, nausea, vomiting.  ED Course: Signs in the ED notable for temperature of 96.4, blood pressure in the 90s to 110s systolic, heart rate in the 90s to 100s, requiring 3 L to maintain saturations.  Lab workup included CMP with creatinine mildly elevated at 1.25 from baseline of around 1, glucose 49, calcium 8.7, protein 6.3, albumin 3.3, AST mildly elevated 47.  CBC showed leukocytosis to 14.9.  Troponin mildly elevated at 65 with repeat pending.  BNP elevated to 770.  Lactic acid 3.3 with repeat pending.  MRSA screen pending.  Respiratory panel for flu COVID and RSV negative.  Blood culture pending.  Chest x-ray with bilateral lower lobe opacities right greater than left, atelectasis and/or pneumonia with likely pleural effusions and evidence  of interstitial thickening possibly consistent with edema.  Patient received vancomycin, Levaquin, liter of IV fluids in the ED.  Review of Systems: As per HPI otherwise all other systems reviewed and are negative.  Past Medical History:  Diagnosis Date   Acute foot pain, right 04/15/2022   Age-related osteoporosis without current pathological fracture 04/06/2018   ALLERGIC RHINITIS 05/20/2009   Qualifier: Diagnosis of   By: Excell Seltzer CMA, Lawson Fiscal      Replacing diagnoses that were inactivated after the 05/25/22 regulatory import   Anxiety    Arthritis, multiple joint involvement 04/06/2018   C O P D 05/20/2009   Qualifier: Diagnosis of   By: Excell Seltzer CMA, Lori      Replacing diagnoses that were inactivated after the 05/25/22 regulatory import   Calculus of gallbladder with chronic cholecystitis without obstruction 04/30/2016   CAP (community acquired pneumonia) 05/13/2019   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/26/2020   Cigarette nicotine dependence without complication 12/26/2020   Cough 05/20/2009   Centricity Description: COUGH  Qualifier: Diagnosis of   By: Excell Seltzer CMA, Lawson Fiscal      Centricity Description: COUGH DUE TO ACE INHIBITORS  Qualifier: Diagnosis of   By: Craige Cotta MD, Vineet       Elevated troponin 05/13/2019   Essential hypertension 05/13/2019   G E R D 05/20/2009   Qualifier: Diagnosis of   By: Excell Seltzer CMA, Lori       Generalized edema 08/08/2019   Hypertension    Hypoxia 05/13/2019   Irritable bowel syndrome with diarrhea 09/27/2019   Leukocytosis 05/13/2019  Lipoma of torso 04/07/2019   Mixed dyslipidemia 04/06/2018   NSVT (nonsustained ventricular tachycardia) (HCC) 05/13/2019   Prolonged Q-T interval on ECG 05/13/2019   Recurrent major depressive disorder, in full remission (HCC) 04/06/2018   Respiratory infection 04/15/2022   Right inguinal hernia 03/02/2018   Sebaceous cyst 04/07/2019   TOBACCO ABUSE 05/21/2009   Qualifier: Diagnosis of   By: Craige Cotta MD,  Vineet        Past Surgical History:  Procedure Laterality Date   CHOLECYSTECTOMY     HERNIA REPAIR      Social History  reports that she has quit smoking. She has never used smokeless tobacco. She reports that she does not currently use alcohol. She reports that she does not currently use drugs.  Allergies  Allergen Reactions   Ceftriaxone Hives, Shortness Of Breath and Swelling    Family History  Problem Relation Age of Onset   Epilepsy Father    CVA Father   Reviewed on admission  Prior to Admission medications   Medication Sig Start Date End Date Taking? Authorizing Provider  aspirin 81 MG EC tablet Take 81 mg by mouth daily.    [provider]  benzonatate (TESSALON) 200 MG capsule Take 200 mg by mouth 3 (three) times daily as needed for cough. 05/09/19   [provider]  BREZTRI AEROSPHERE 160-9-4.8 MCG/ACT AERO Inhale 2 puffs into the lungs in the morning and at bedtime. 06/19/22   [provider]  diphenoxylate-atropine (LOMOTIL) 2.5-0.025 MG tablet Take 1 tablet by mouth 4 (four) times daily as needed for diarrhea or loose stools.    [provider]  ELIQUIS 2.5 MG TABS tablet Take 2.5 mg by mouth 2 (two) times daily. 06/12/22   [provider]  metoprolol tartrate (LOPRESSOR) 25 MG tablet Take 12.5 mg by mouth 2 (two) times daily. 06/12/22   [provider]  PARoxetine (PAXIL) 20 MG tablet Take 20 mg by mouth daily. 01/23/19   [provider]  pravastatin (PRAVACHOL) 40 MG tablet Take 40 mg by mouth daily. 05/19/22   [provider]    Physical Exam: Vitals:   08/30/22 1130 08/30/22 1145 08/30/22 1200 08/30/22 1215  BP: 91/61 107/66 98/63 94/60   Pulse: (!) 103 (!) 107 (!) 105 100  Resp: 17 16 20  (!) 23  Temp:      TempSrc:      SpO2: 95% 95% 92% 92%    Physical Exam Constitutional:      General: She is not in acute distress.    Appearance: Normal appearance.  HENT:     Head: Normocephalic  and atraumatic.     Mouth/Throat:     Mouth: Mucous membranes are moist.     Pharynx: Oropharynx is clear.  Eyes:     Extraocular Movements: Extraocular movements intact.     Pupils: Pupils are equal, round, and reactive to light.  Cardiovascular:     Rate and Rhythm: Normal rate and regular rhythm.     Pulses: Normal pulses.     Heart sounds: Normal heart sounds.  Pulmonary:     Effort: Pulmonary effort is normal. No respiratory distress.     Breath sounds: Decreased breath sounds and rales present.  Abdominal:     General: Bowel sounds are normal. There is no distension.     Palpations: Abdomen is soft.     Tenderness: There is no abdominal tenderness.  Musculoskeletal:        General: No swelling or deformity.  Skin:    General: Skin is warm and dry.  Neurological:     General: No focal deficit present.     Mental Status: Mental status is at baseline.    Labs on Admission: I have personally reviewed following labs and imaging studies  CBC: Recent Labs  Lab 08/30/22 1010  WBC 14.9*  NEUTROABS 7.0  HGB 12.2  HCT 39.6  MCV 98.8  PLT 201    Basic Metabolic Panel: Recent Labs  Lab 08/30/22 1010  NA 140  K 5.0  CL 100  CO2 26  GLUCOSE 249*  BUN 19  CREATININE 1.25*  CALCIUM 8.7*    GFR: CrCl cannot be calculated (Unknown ideal weight.).  Liver Function Tests: Recent Labs  Lab 08/30/22 1010  AST 47*  ALT 20  ALKPHOS 79  BILITOT 0.7  PROT 6.3*  ALBUMIN 3.3*    Urine analysis: No results found for: "COLORURINE", "APPEARANCEUR", "LABSPEC", "PHURINE", "GLUCOSEU", "HGBUR", "BILIRUBINUR", "KETONESUR", "PROTEINUR", "UROBILINOGEN", "NITRITE", "LEUKOCYTESUR"  Radiological Exams on Admission: DG Chest Portable 1 View  Result Date: 08/30/2022 CLINICAL DATA:  Short of breath. EXAM: PORTABLE CHEST 1 VIEW COMPARISON:  05/25/2022 and older studies.  CT, 07/06/2022. FINDINGS: Cardiac silhouette is normal in size. Moderate-sized hiatal hernia. No mediastinal or  hilar masses. Bilateral interstitial thickening increased when compared to the prior radiographs. There is new airspace opacity in the lower lungs, right greater than left. Small effusions are suspected. No pneumothorax. Skeletal structures are demineralized, grossly intact. IMPRESSION: 1. Lower lung opacities, right greater than left, consistent with a combination of atelectasis and/or pneumonia and probable pleural effusions. There is also diffuse interstitial thickening increased when compared to the prior chest radiographs. Consider interstitial pulmonary edema in the proper clinical setting. Underlying COPD. Electronically Signed   By: Amie Portland M.D.   On: 08/30/2022 10:33    EKG: Independently reviewed.  Most consistent with accelerated sinus arrhythmia at 99 bpm.  Evidence of LVH with likely repolarization abnormality leading to J-point elevation.  Nonspecific T wave changes.  Assessment/Plan Principal Problem:   Acute on chronic respiratory failure with hypoxia (HCC) Active Problems:   G E R D   CAP (community acquired pneumonia)   NSVT (nonsustained ventricular tachycardia) (HCC)   Prolonged Q-T interval on ECG   Anxiety   Hypertension   History of CVA (cerebrovascular accident)   Irritable bowel syndrome with diarrhea   Mixed dyslipidemia   Recurrent major depressive disorder, in full remission (HCC)   Paroxysmal atrial fibrillation (HCC)   CAD (coronary artery disease)   Elevated brain natriuretic peptide (BNP) level   COPD with acute exacerbation (HCC)   Acute on chronic respiratory failure with hypoxia ?Pneumonia ?COPD exacerbation ?CHF > Patient presenting with shortness of breath found to be hypoxic by EMS and requiring 3 L supplemental oxygen in the ED after being weaned off BiPAP from baseline of 1 L. > Etiology somewhat unclear possibly multifactorial.  Chest x-ray with evidence of bilateral pneumonia plus minus atelectasis and effusions with possibly interstitial  edema. > Leukocytosis supports pneumonia versus reactive leukocytosis, no fever.  Elevated BNP to 770 supports possible new CHF versus strain, troponin flat, no history of CHF. > Exam notable for rales but no edema - Monitor on progressive unit - Keep BiPAP available as needed - Continue supplemental oxygen, wean as tolerated - N.p.o. until patient remains off BiPAP for 2 hours For ?COPD exacerbation - Scheduled Atrovent, as needed xopenex - Continue home Breztri For pneumonia - Continue with Levaquin  given ceftriaxone allergy - Hold off on further vancomycin - Follow-up MRSA screen - Trend fever curve and WBC - Full RVP to further rule out viral etiology given rales and bilateral opacities Rule out CHF - Echocardiogram - I's and O's, daily weights - Trend renal function and electrolytes - Hold off on further IV fluids unless patient becomes hypotensive Addendum > Continued evidence of heart strain with uptrending troponin on the floor.  Now in the 2-3 100s.  No chest pain. - Continue to trend troponin to peak  Elevated creatinine > Creatinine elevated to 1.25 from baseline of 1.  No true AKI. - Trend renal function and electrolytes  Atrial fibrillation - Continue home metoprolol and Eliquis Addendum > Heart rates persistently in the 130s 140s on the floor > Patient remained comfortable, A-fib on monitor, has not yet received evening metoprolol dose, likely related to strain on heart from above. - 12-lead EKG - IV metoprolol 5 mg - Will move up the evening dose of metoprolol - If unable to get controlled with IV metoprolol, consider amiodarone given concern for possible new CHF as above  Hypertension - Resume home metoprolol tomorrow with low normal blood pressures here  Hyperlipidemia History of CVA - Resume home pravastatin - Continue home aspirin  CAD - Continue home aspirin, metoprolol, pravastatin  Depression Anxiety - Continue home Paxil  IBS - Continue  home Lomotil as needed  DVT prophylaxis: Eliquis Code Status:   Full, wound not want to be on life support long term.  Family Communication:  Updated at bedside  Disposition Plan:   Patient is from:  Home  Anticipated DC to:  Home  Anticipated DC date:  2 to 5 days  Anticipated DC barriers: None  Consults called:  None Admission status:  Inpatient, progressive  Severity of Illness: The appropriate patient status for this patient is INPATIENT. Inpatient status is judged to be reasonable and necessary in order to provide the required intensity of service to ensure the patient's safety. The patient's presenting symptoms, physical exam findings, and initial radiographic and laboratory data in the context of their chronic comorbidities is felt to place them at high risk for further clinical deterioration. Furthermore, it is not anticipated that the patient will be medically stable for discharge from the hospital within 2 midnights of admission.   * I certify that at the point of admission it is my clinical judgment that the patient will require inpatient hospital care spanning beyond 2 midnights from the point of admission due to high intensity of service, high risk for further deterioration and high frequency of surveillance required.Synetta Fail MD [facility]  How to contact the [facility] Attending or Consulting provider 7A - 7P or covering provider during after hours 7P -7A, for this patient?   Check the care team in [facility] and look for a) attending/consulting [facility] provider listed and b) the [facility] team listed Log into [email or web address] and use [place]'s universal password to access. If you do not have the password, please contact the hospital operator. Locate the [facility] provider you are looking for under [facility] and page to a number that you can be directly reached. If you still have difficulty reaching the provider, please page the [facility] (Director on Call) for the Hospitalists  listed on amion for assistance.  08/30/2022, 12:49 PM

## 2022-08-30 NOTE — Progress Notes (Signed)
Notified by CCMD of patient's HR sustaining in the 150s. This nurse entered room to find patient asleep. Patient awoken and has no complaints at this time. Dr. Alinda Money made aware, EKG obtained. Home PO metoprolol given in addition to IV metoprolol (see eMAR).

## 2022-08-30 NOTE — ED Provider Notes (Incomplete)
Heather Molina Provider Note   CSN: [id number] Arrival date & time: 08/30/22  1000     History {Add pertinent medical, surgical, social history, OB history to HPI:1} Chief Complaint  Patient presents with  . Shortness of Breath    Heather Molina is a 82 y.o. female.  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  Family providing much of history, reports she has chronic cough with her COPD which does not appear to be worsening. For the last few days she has had dyspnea just taking the dog out which is new. Increasing dyspnea, then today found her down, minimally responsive and blue in color.  Called EMS, who found her to have saturations in the 40s, placed on nonrebreather, given duonebs, solumedrol, Mg and Epi. Has not described chest pain, no fever, no congestion.   She does not want intubation, but does report she would want chest compressions WITHOUT intubation if she were to code.  Past Medical History:  Diagnosis Date  . Acute foot pain, right 04/15/2022  . Age-related osteoporosis without current pathological fracture 04/06/2018  . ALLERGIC RHINITIS 05/20/2009   Qualifier: Diagnosis of   By: Excell Seltzer CMA, Lawson Fiscal      Replacing diagnoses that were inactivated after the 05/25/22 regulatory import  . Anxiety   . Arthritis, multiple joint involvement 04/06/2018  . C O P D 05/20/2009   Qualifier: Diagnosis of   By: Excell Seltzer CMA, Lawson Fiscal      Replacing diagnoses that were inactivated after the 05/25/22 regulatory import  . Calculus of gallbladder with chronic cholecystitis without obstruction 04/30/2016  . CAP (community acquired pneumonia) 05/13/2019  . 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 Cigarette nicotine  dependence without complication 12/26/2020  . Cough 05/20/2009   Centricity Description: COUGH  Qualifier: Diagnosis of   By: Excell Seltzer CMA, Lawson Fiscal      Centricity Description: COUGH DUE TO ACE INHIBITORS  Qualifier: Diagnosis of   By: Craige Cotta MD, Vineet      . Elevated troponin 05/13/2019  . Essential hypertension 05/13/2019  . G E R D 05/20/2009   Qualifier: Diagnosis of   By: Excell Seltzer CMA, Lawson Fiscal      . Generalized edema 08/08/2019  . Hypertension   . Hypoxia 05/13/2019  . Irritable bowel syndrome with diarrhea 09/27/2019  . Leukocytosis 05/13/2019  . Lipoma of torso 04/07/2019  . Mixed dyslipidemia 04/06/2018  . NSVT (nonsustained ventricular tachycardia) (HCC) 05/13/2019  . Prolonged Q-T interval on ECG 05/13/2019  . Recurrent major depressive disorder, in full remission (HCC) 04/06/2018  . Respiratory infection 04/15/2022  . Right inguinal hernia 03/02/2018  . Sebaceous cyst 04/07/2019  . TOBACCO ABUSE 05/21/2009   Qualifier: Diagnosis of   By: Craige Cotta MD, Vineet         Home Medications Prior to Admission medications   Medication Sig Start Date End Date Taking? Authorizing Provider  aspirin 81 MG EC tablet Take 81 mg by mouth daily.    [provider]  benzonatate (TESSALON) 200 MG capsule Take 200 mg by mouth 3 (three) times daily as needed for cough. 05/09/19   [provider]  BREZTRI AEROSPHERE 160-9-4.8 MCG/ACT AERO Inhale 2 puffs into the lungs in the morning and at bedtime. 06/19/22  [provider]  diphenoxylate-atropine (LOMOTIL) 2.5-0.025 MG tablet Take 1 tablet by mouth 4 (four) times daily as needed for diarrhea or loose stools.    [provider]  ELIQUIS 2.5 MG TABS tablet Take 2.5 mg by mouth 2 (two) times daily. 06/12/22   [provider]  metoprolol tartrate (LOPRESSOR) 25 MG tablet Take 12.5 mg by mouth 2 (two) times daily. 06/12/22   [provider]  PARoxetine (PAXIL) 20 MG tablet Take 20 mg by mouth daily. 01/23/19    [provider]  pravastatin (PRAVACHOL) 40 MG tablet Take 40 mg by mouth daily. 05/19/22   [provider]      Allergies    Ceftriaxone    Review of Systems   Review of Systems  Physical Exam Updated Vital Signs BP (!) 155/124   Pulse 81   Temp (!) 96.4 F (35.8 C) (Tympanic)   Resp (!) 33   SpO2 100%  Physical Exam  ED Results / Procedures / Treatments   Labs (all labs ordered are listed, but only abnormal results are displayed) Labs Reviewed  CBG MONITORING, ED - Abnormal; Notable for the following components:      Result Value   Glucose-Capillary 222 (*)    All other components within normal limits  RESP PANEL BY RT-PCR (RSV, FLU A&B, COVID)  RVPGX2  CULTURE, BLOOD (ROUTINE X 2)  CULTURE, BLOOD (ROUTINE X 2)  CBC WITH DIFFERENTIAL/PLATELET  COMPREHENSIVE METABOLIC PANEL  LACTIC ACID, PLASMA  LACTIC ACID, PLASMA  BRAIN NATRIURETIC PEPTIDE  TROPONIN I (HIGH SENSITIVITY)    EKG None  Radiology No results found.  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  Differential diagnosis for dyspnea includes ACS, PE, COPD exacerbation, CHF exacerbation, anemia, pneumonia, viral etiology such as COVID 19 infection, metabolic abnormality.  Is sleepy on arrival but following commands, able to answer some yes no  questions, placed on BiPAP.   Reviewed ECG and discussed ECG with Dr. Clifton James, the STEMI doctor on call, given some slight LV elevations anteriorly with LVH pattern and agree that history and ECG to not indicate need for transfer to the cath lab emergently.   Chest XR evaluated by me and radiology shows lower lung opacities, right greater than left, consider pneumonia and probably pleural effusions.   Labs obtained and evaluated by me show Cr mildly elevated compared to prior, leukocytosis 14000, troponin 65 likely in setting of severe hypoxia and acute illness, BNP 770.    Presents initially sleep, increased WOB, placed on BiPAP, was hypertensive to 200s systolic but now decreased to 29F.  Respiratory status improved after BiPAP as well as medications given by EMS (solumedrol, epi Mg(       {Document critical care time when appropriate:1} {Document review of labs  and clinical decision tools ie heart score, Chads2Vasc2 etc:1}  {Document your independent review of radiology images, and any outside records:1} {Document your discussion with family members, caretakers, and with consultants:1} {Document social determinants of health affecting pt's care:1} {Document your decision making why or why not admission, treatments were needed:1} Final Clinical Impression(s) / ED Diagnoses Final diagnoses:  None    Rx / DC Orders ED Discharge Orders     None

## 2022-08-30 NOTE — ED Provider Notes (Signed)
[place] EMERGENCY DEPARTMENT AT [facility] Provider Note   CSN: [id number] Arrival date & time: 08/30/22  1000     History {Add pertinent medical, surgical, social history, OB history to HPI:1} Chief Complaint  Patient presents with   Shortness of Breath    Heather Molina is a 82 y.o. female.  HPI     Home Medications Prior to Admission medications   Medication Sig Start Date End Date Taking? Authorizing Provider  aspirin 81 MG EC tablet Take 81 mg by mouth daily.    [provider]  benzonatate (TESSALON) 200 MG capsule Take 200 mg by mouth 3 (three) times daily as needed for cough. 05/09/19   [provider]  BREZTRI AEROSPHERE 160-9-4.8 MCG/ACT AERO Inhale 2 puffs into the lungs in the morning and at bedtime. 06/19/22   [provider]  diphenoxylate-atropine (LOMOTIL) 2.5-0.025 MG tablet Take 1 tablet by mouth 4 (four) times daily as needed for diarrhea or loose stools.    [provider]  ELIQUIS 2.5 MG TABS tablet Take 2.5 mg by mouth 2 (two) times daily. 06/12/22   [provider]  metoprolol tartrate (LOPRESSOR) 25 MG tablet Take 12.5 mg by mouth 2 (two) times daily. 06/12/22   [provider]  PARoxetine (PAXIL) 20 MG tablet Take 20 mg by mouth daily. 01/23/19   [provider]  pravastatin (PRAVACHOL) 40 MG tablet Take 40 mg by mouth daily. 05/19/22   [provider]      Allergies    Ceftriaxone    Review of Systems   Review of Systems  Physical Exam Updated Vital Signs BP (!) 155/124   Pulse 81   Temp (!) 96.4 F (35.8 C) (Tympanic)   Resp (!) 33   SpO2 100%  Physical Exam  ED Results / Procedures / Treatments   Labs (all labs ordered are listed, but only abnormal results are displayed) Labs Reviewed  CBG MONITORING, ED - Abnormal; Notable for the following components:      Result Value   Glucose-Capillary 222 (*)    All other components within normal limits  RESP PANEL  BY RT-PCR (RSV, FLU A&B, COVID)  [id number]  CULTURE, BLOOD (ROUTINE X 2)  CULTURE, BLOOD (ROUTINE X 2)  CBC WITH DIFFERENTIAL/PLATELET  COMPREHENSIVE METABOLIC PANEL  LACTIC ACID, PLASMA  LACTIC ACID, PLASMA  BRAIN NATRIURETIC PEPTIDE  TROPONIN I (HIGH SENSITIVITY)    EKG None  Radiology No results found.  Procedures Procedures  {Document cardiac monitor, telemetry assessment procedure when appropriate:1}  Medications Ordered in ED Medications  levofloxacin (LEVAQUIN) IVPB 750 mg (has no administration in time range)  labetalol (NORMODYNE) injection 5 mg (has no administration in time range)    ED Course/ Medical Decision Making/ A&P   {   Click here for ABCD2, HEART and other calculatorsREFRESH Note before signing :1}                          Medical Decision Making Amount and/or Complexity of Data Reviewed Labs: ordered. Radiology: ordered.  Risk Prescription drug management.   ***  {Document critical care time when appropriate:1} {Document review of labs and clinical decision tools ie heart score, Chads2Vasc2 etc:1}  {Document your independent review of radiology images, and any outside records:1} {Document your discussion with family members, caretakers, and with consultants:1} {Document social determinants of health affecting pt's care:1} {Document your decision making why or why not admission, treatments were needed:1}  Final Clinical Impression(s) / ED Diagnoses Final diagnoses:  None    Rx / DC Orders ED Discharge Orders     None

## 2022-08-30 NOTE — Progress Notes (Signed)
RT arrived bedside to assess patient upon arrival. Patient noted to have increased WOB.  RT placed patient on bipap per Dr.Schlossman. RT asked patient if breathing seems easier on bipap and patient responded yes. RN at bedside currently. RT will continue to monitor.

## 2022-08-30 NOTE — Progress Notes (Addendum)
Pharmacy Antibiotic Note  Heather Molina is a 82 y.o. female for which pharmacy has been consulted for vancomycin dosing for pneumonia.  Patient with a history of anxiety, depression,CVA, generalized edema, &COPD. Patient presenting with SOB.  SCr 1.25  WBC 14.9; LA 4.1; T 96.4; HR 94; RR 16 COVID neg / flu neg  Plan: Levaquin per MD Vancomycin 1000 mg once then 750 mg q48hr (eAUC 507.6) unless change in renal function Monitor WBC, fever, renal function, cultures De-escalate when able Levels at steady state F/u MRSA PCR     Temp (24hrs), Avg:96.4 F (35.8 C), Min:96.4 F (35.8 C), Max:96.4 F (35.8 C)  No results for input(s): "WBC", "CREATININE", "LATICACIDVEN", "VANCOTROUGH", "VANCOPEAK", "VANCORANDOM", "GENTTROUGH", "GENTPEAK", "GENTRANDOM", "TOBRATROUGH", "TOBRAPEAK", "TOBRARND", "AMIKACINPEAK", "AMIKACINTROU", "AMIKACIN" in the last 168 hours.  CrCl cannot be calculated (Patient's most recent lab result is older than the maximum 21 days allowed.).    Allergies  Allergen Reactions   Ceftriaxone Hives, Shortness Of Breath and Swelling   Microbiology results: Pending  Thank you for allowing pharmacy to be a part of this patient's care.  Delmar Landau, PharmD, BCPS 08/30/2022 10:43 AM ED Clinical Pharmacist -  [phone]  ADDENDUM Vancomycin d/c by MD Hale Bogus, PharmD, BCPS 08/30/2022 2:20 PM ED Clinical Pharmacist -  [phone]

## 2022-08-30 NOTE — ED Triage Notes (Signed)
Pt BIB [facility] from home d/t SOB. Per EMS, pt called her daughter this morning stated that she was not feeling well and was feeling SOB. Pt wears 1L at baseline. Pt has COPD. Upon EMS arrival , pt was satting in 30s. Pt was placed on non-rebreather. 5 neb treatments were given. 125 solumedrol ,2mg  Mag  and epi was given.

## 2022-08-31 ENCOUNTER — Other Ambulatory Visit (HOSPITAL_COMMUNITY): Payer: Self-pay

## 2022-08-31 ENCOUNTER — Inpatient Hospital Stay (HOSPITAL_COMMUNITY): Payer: Medicare Other

## 2022-08-31 DIAGNOSIS — J9621 Acute and chronic respiratory failure with hypoxia: Secondary | ICD-10-CM | POA: Diagnosis not present

## 2022-08-31 DIAGNOSIS — R0609 Other forms of dyspnea: Secondary | ICD-10-CM | POA: Diagnosis not present

## 2022-08-31 LAB — COMPREHENSIVE METABOLIC PANEL
ALT: 17 U/L (ref 0–44)
AST: 34 U/L (ref 15–41)
Albumin: 2.9 g/dL — ABNORMAL LOW (ref 3.5–5.0)
Alkaline Phosphatase: 57 U/L (ref 38–126)
Anion gap: 10 (ref 5–15)
BUN: 28 mg/dL — ABNORMAL HIGH (ref 8–23)
CO2: 25 mmol/L (ref 22–32)
Calcium: 8.1 mg/dL — ABNORMAL LOW (ref 8.9–10.3)
Chloride: 98 mmol/L (ref 98–111)
Creatinine, Ser: 1.49 mg/dL — ABNORMAL HIGH (ref 0.44–1.00)
GFR, Estimated: 35 mL/min — ABNORMAL LOW (ref >60)
Glucose, Bld: 137 mg/dL — ABNORMAL HIGH (ref 70–99)
Potassium: 3.8 mmol/L (ref 3.5–5.1)
Sodium: 133 mmol/L — ABNORMAL LOW (ref 135–145)
Total Bilirubin: 0.8 mg/dL (ref 0.3–1.2)
Total Protein: 5.5 g/dL — ABNORMAL LOW (ref 6.5–8.1)

## 2022-08-31 LAB — CBC
HCT: 28.7 % — ABNORMAL LOW (ref 36.0–46.0)
Hemoglobin: 9.3 g/dL — ABNORMAL LOW (ref 12.0–15.0)
MCH: 30.5 pg (ref 26.0–34.0)
MCHC: 32.4 g/dL (ref 30.0–36.0)
MCV: 94.1 fL (ref 80.0–100.0)
Platelets: 127 10*3/uL — ABNORMAL LOW (ref 150–400)
RBC: 3.05 MIL/uL — ABNORMAL LOW (ref 3.87–5.11)
RDW: 13.3 % (ref 11.5–15.5)
WBC: 9.1 10*3/uL (ref 4.0–10.5)
nRBC: 0 % (ref 0.0–0.2)

## 2022-08-31 LAB — ECHOCARDIOGRAM COMPLETE
AR max vel: 1.28 cm2
AV Area VTI: 1.26 cm2
AV Area mean vel: 1.35 cm2
AV Mean grad: 7 mmHg
AV Peak grad: 13.7 mmHg
AV Vena cont: 0.6 cm
Ao pk vel: 1.85 m/s
Area-P 1/2: 3.49 cm2
Est EF: 55
Height: 60 in
P 1/2 time: 423 msec
S' Lateral: 2.7 cm
Weight: 1774.26 oz

## 2022-08-31 MED ORDER — AMIODARONE HCL 200 MG PO TABS
200.0000 mg | ORAL_TABLET | Freq: Every day | ORAL | Status: DC
  Administered 2022-08-31 – 2022-09-01 (×2): 200 mg via ORAL
  Filled 2022-08-31 (×2): qty 1

## 2022-08-31 MED ORDER — SODIUM CHLORIDE 0.9 % IV SOLN
100.0000 mg | Freq: Two times a day (BID) | INTRAVENOUS | Status: DC
  Administered 2022-08-31 – 2022-09-01 (×2): 100 mg via INTRAVENOUS
  Filled 2022-08-31 (×2): qty 100

## 2022-08-31 MED ORDER — PREDNISONE 20 MG PO TABS: 20.0000 mg | ORAL_TABLET | Freq: Two times a day (BID) | ORAL | Status: DC

## 2022-08-31 MED ORDER — MELATONIN 3 MG PO TABS
3.0000 mg | ORAL_TABLET | Freq: Every evening | ORAL | Status: DC | PRN
  Administered 2022-08-31: 3 mg via ORAL
  Filled 2022-08-31: qty 1

## 2022-08-31 MED ORDER — PREDNISONE 20 MG PO TABS
50.0000 mg | ORAL_TABLET | Freq: Every day | ORAL | Status: DC
  Administered 2022-08-31 – 2022-09-01 (×2): 50 mg via ORAL
  Filled 2022-08-31 (×2): qty 1

## 2022-08-31 MED ORDER — IPRATROPIUM BROMIDE 0.02 % IN SOLN: 0.5000 mg | Freq: Four times a day (QID) | RESPIRATORY_TRACT | Status: DC | PRN

## 2022-08-31 NOTE — Progress Notes (Signed)
Echocardiogram 2D Echocardiogram has been performed.  Warren Lacy Velton Roselle RDCS 08/31/2022, 11:57 AM

## 2022-08-31 NOTE — TOC Benefit Eligibility Note (Signed)
Pharmacy Patient Advocate Encounter  Insurance verification completed.    The patient is insured through [facility] Medicare Part D   Ran test claim for Eliquis 5mg  tabs and the current 30 day co-pay is $149.53.   This test claim was processed through [facility]- copay amounts may vary at other pharmacies due to pharmacy/plan contracts, or as the patient moves through the different stages of their insurance plan.

## 2022-08-31 NOTE — Progress Notes (Signed)
PROGRESS NOTE    Heather Molina  XBM:[id number] DOB: 06-08-1940 DOA: 08/30/2022 PCP: Hadley Pen, MD   Brief Narrative:  Heather Molina is a 82 y.o. female with medical history significant of A-fib, NSVT, hypertension, hyperlipidemia, GERD, stroke, COPD, CAD, depression, anxiety, prolonged QTc, IBS, chronic respiratory failure with hypoxia presenting with shortness of breath and acute hypoxia from baseline with abnormal chest xray. Hospitalist called for admission.   Assessment & Plan:   Principal Problem:   Acute on chronic respiratory failure with hypoxia (HCC) Active Problems:   G E R D   CAP (community acquired pneumonia)   NSVT (nonsustained ventricular tachycardia) (HCC)   Prolonged Q-T interval on ECG   Anxiety   Hypertension   History of CVA (cerebrovascular accident)   Irritable bowel syndrome with diarrhea   Mixed dyslipidemia   Recurrent major depressive disorder, in full remission (HCC)   Paroxysmal atrial fibrillation (HCC)   CAD (coronary artery disease)   Elevated brain natriuretic peptide (BNP) level   COPD with acute exacerbation (HCC)  Acute on chronic respiratory failure with hypoxia multifactorial, POA Severe sepsis secondary to presumed community-acquired versus aspiration pneumonia Rule out concurrent COPD exacerbation versus volume overload -Leukocytosis, tachycardia, tachypnea, source given abnormal chest x-ray right lower lobe pneumonia with lactic acidosis and AKI -X-ray shows vascular congestion/interstitial edema with right lower lobe opacity concerning for pneumonia, viral panel is negative, likely bacterial versus aspiration in nature -Patient respiratory status baseline is 1 L nasal cannula 1 "as needed" per patient  -oxygen already weaned down from BiPAP to 2 L nasal cannula.   -Clinically patient appears euvolemic however noted rales bilaterally -Continue treatment with multimodal fashion with antibiotics with doxycycline (prolonged QT and  cephalosporin allergy). -Add steroids to cover PNA and questionable concurrent COPD flare -Echo shows diastolic grade 2 dysfunction - EF 55% -Continue nebs/inhalers  AKI, POA - Secondary to above - Follow given recent diuretics   Atrial fibrillation  with RVR - provoked by severe sepsis - Continue home metoprolol and Eliquis -Transition from amiodarone drip to PO   Hypertension - Metoprolol 12.5 ongoing   Hyperlipidemia History of CVA - Resume home pravastatin - Continue home aspirin   CAD - Continue home aspirin, metoprolol, pravastatin   Depression Anxiety - Continue home Paxil   IBS - Continue home Lomotil as needed  DVT prophylaxis: apixaban (ELIQUIS) tablet 2.5 mg Start: 08/30/22 2200 apixaban (ELIQUIS) tablet 2.5 mg  Code Status:   Code Status: Full Code Family Communication: None present  Status is: Inpt  Dispo: The patient is from: Home              Anticipated d/c is to: Home              Anticipated d/c date is: 24-48h              Patient currently NOT medically stable for discharge  Consultants:  None  Procedures:  None  Antimicrobials:  Doxy x 5 days   Subjective: No acute issue/events overnight -respiratory status essentially back to baseline  Objective: Vitals:   08/31/22 0117 08/31/22 0358 08/31/22 0820 08/31/22 0836  BP:  (!) 125/91 (!) 141/67   Pulse: 74 72    Resp: 16 16 16    Temp:  97.8 F (36.6 C) 97.7 F (36.5 C)   TempSrc:  Oral Oral   SpO2: 95% 99%  100%  Weight:  50.3 kg    Height:  Intake/Output Summary (Last 24 hours) at 08/31/2022 0843 Last data filed at 08/31/2022 0730 Gross per 24 hour  Intake 2169.71 ml  Output 1250 ml  Net 919.71 ml   Filed Weights   08/30/22 1437 08/31/22 0358  Weight: 50.4 kg 50.3 kg    Examination:  General:  Pleasantly resting in bed, No acute distress. HEENT:  Normocephalic atraumatic.  Sclerae nonicteric, noninjected.  Extraocular movements intact bilaterally. Neck:  Without  mass or deformity.  Trachea is midline. Lungs: Bibasilar rales - without wheeze or rhonchi Heart:  Afib, rate 80-90. Abdomen:  Soft, nontender, nondistended.  Without guarding or rebound. Extremities: Without cyanosis, clubbing, edema, or obvious deformity. Skin:  Warm and dry, no erythema.  Data Reviewed: I have personally reviewed following labs and imaging studies  CBC: Recent Labs  Lab 08/30/22 1010 08/31/22 0259  WBC 14.9* 9.1  NEUTROABS 7.0  --   HGB 12.2 9.3*  HCT 39.6 28.7*  MCV 98.8 94.1  PLT 201 127*   Basic Metabolic Panel: Recent Labs  Lab 08/30/22 1010 08/30/22 1211 08/31/22 0259  NA 140  --  133*  K 5.0  --  3.8  CL 100  --  98  CO2 26  --  25  GLUCOSE 249*  --  137*  BUN 19  --  28*  CREATININE 1.25*  --  1.49*  CALCIUM 8.7*  --  8.1*  MG  --  2.7*  --    GFR: Estimated Creatinine Clearance: 20.5 mL/min (A) (by C-G formula based on SCr of 1.49 mg/dL (H)). Liver Function Tests: Recent Labs  Lab 08/30/22 1010 08/31/22 0259  AST 47* 34  ALT 20 17  ALKPHOS 79 57  BILITOT 0.7 0.8  PROT 6.3* 5.5*  ALBUMIN 3.3* 2.9*   CBG: Recent Labs  Lab 08/30/22 1006  GLUCAP 222*    Sepsis Labs: Recent Labs  Lab 08/30/22 1019 08/30/22 1210 08/30/22 1211 08/30/22 1617  PROCALCITON  --   --  <0.10  --   LATICACIDVEN 3.3* 4.1*  --  2.8*    Recent Results (from the past 240 hour(s))  Resp panel by RT-PCR (RSV, Flu A&B, Covid) Anterior Nasal Swab     Status: None   Collection Time: 08/30/22 10:12 AM   Specimen: Anterior Nasal Swab  Result Value Ref Range Status   SARS Coronavirus 2 by RT PCR NEGATIVE NEGATIVE Final   Influenza A by PCR NEGATIVE NEGATIVE Final   Influenza B by PCR NEGATIVE NEGATIVE Final    Comment: (NOTE) The Xpert Xpress SARS-CoV-2/FLU/RSV plus assay is intended as an aid in the diagnosis of influenza from Nasopharyngeal swab specimens and should not be used as a sole basis for treatment. Nasal washings and aspirates are  unacceptable for Xpert Xpress SARS-CoV-2/FLU/RSV testing.  Fact Sheet for Patients: BloggerCourse.com  Fact Sheet for Healthcare Providers: SeriousBroker.it  This test is not yet approved or cleared by the [place] FDA and has been authorized for detection and/or diagnosis of SARS-CoV-2 by FDA under an Emergency Use Authorization (EUA). This EUA will remain in effect (meaning this test can be used) for the duration of the COVID-19 declaration under Section 564(b)(1) of the Act, 21 U.S.C. section 360bbb-3(b)(1), unless the authorization is terminated or revoked.     Resp Syncytial Virus by PCR NEGATIVE NEGATIVE Final    Comment: (NOTE) Fact Sheet for Patients: BloggerCourse.com  Fact Sheet for Healthcare Providers: SeriousBroker.it  This test is not yet approved or cleared by the [place]  FDA and has been authorized for detection and/or diagnosis of SARS-CoV-2 by FDA under an Emergency Use Authorization (EUA). This EUA will remain in effect (meaning this test can be used) for the duration of the COVID-19 declaration under Section 564(b)(1) of the Act, 21 U.S.C. section 360bbb-3(b)(1), unless the authorization is terminated or revoked.  Performed at [facility], [address], [place], [place] [address]   Blood culture (routine x 2)     Status: None (Preliminary result)   Collection Time: 08/30/22 10:19 AM   Specimen: BLOOD LEFT ARM  Result Value Ref Range Status   Specimen Description BLOOD LEFT ARM  Final   Special Requests   Final    BOTTLES DRAWN AEROBIC AND ANAEROBIC Blood Culture results may not be optimal due to an excessive volume of blood received in culture bottles   Culture   Final    NO GROWTH < 24 HOURS Performed at [facility], [address] [place], [place] [address]    Report Status PENDING  Incomplete  Blood culture (routine x 2)      Status: None (Preliminary result)   Collection Time: 08/30/22 10:20 AM   Specimen: BLOOD RIGHT WRIST  Result Value Ref Range Status   Specimen Description BLOOD RIGHT WRIST  Final   Special Requests   Final    BOTTLES DRAWN AEROBIC AND ANAEROBIC Blood Culture adequate volume   Culture   Final    NO GROWTH < 24 HOURS Performed at [facility], [address] [place], [place] [address]    Report Status PENDING  Incomplete  MRSA Next Gen by PCR, Nasal     Status: None   Collection Time: 08/30/22 11:10 AM   Specimen: Nasal Mucosa; Nasal Swab  Result Value Ref Range Status   MRSA by PCR Next Gen NOT DETECTED NOT DETECTED Final    Comment: (NOTE) The GeneXpert MRSA Assay (FDA approved for NASAL specimens only), is one component of a comprehensive MRSA colonization surveillance program. It is not intended to diagnose MRSA infection nor to guide or monitor treatment for MRSA infections. Test performance is not FDA approved in patients less than 87 years old. Performed at [facility], [address], [place], [place] [address]   Respiratory (~20 pathogens) panel by PCR     Status: None   Collection Time: 08/30/22  5:46 PM   Specimen: Nasopharyngeal Swab; Respiratory  Result Value Ref Range Status   Adenovirus NOT DETECTED NOT DETECTED Final   Coronavirus 229E NOT DETECTED NOT DETECTED Final    Comment: (NOTE) The Coronavirus on the Respiratory Panel, DOES NOT test for the novel  Coronavirus (2019 nCoV)    Coronavirus HKU1 NOT DETECTED NOT DETECTED Final   Coronavirus NL63 NOT DETECTED NOT DETECTED Final   Coronavirus OC43 NOT DETECTED NOT DETECTED Final   Metapneumovirus NOT DETECTED NOT DETECTED Final   Rhinovirus / Enterovirus NOT DETECTED NOT DETECTED Final   Influenza A NOT DETECTED NOT DETECTED Final   Influenza B NOT DETECTED NOT DETECTED Final   Parainfluenza Virus 1 NOT DETECTED NOT DETECTED Final   Parainfluenza Virus 2 NOT DETECTED NOT DETECTED Final    Parainfluenza Virus 3 NOT DETECTED NOT DETECTED Final   Parainfluenza Virus 4 NOT DETECTED NOT DETECTED Final   Respiratory Syncytial Virus NOT DETECTED NOT DETECTED Final   Bordetella pertussis NOT DETECTED NOT DETECTED Final   Bordetella Parapertussis NOT DETECTED NOT DETECTED Final   Chlamydophila pneumoniae NOT  DETECTED NOT DETECTED Final   Mycoplasma pneumoniae NOT DETECTED NOT DETECTED Final    Comment: Performed at [facility], [address], [place], [place] [address]         Radiology Studies: DG Chest Portable 1 View  Result Date: 08/30/2022 CLINICAL DATA:  Short of breath. EXAM: PORTABLE CHEST 1 VIEW COMPARISON:  05/25/2022 and older studies.  CT, 07/06/2022. FINDINGS: Cardiac silhouette is normal in size. Moderate-sized hiatal hernia. No mediastinal or hilar masses. Bilateral interstitial thickening increased when compared to the prior radiographs. There is new airspace opacity in the lower lungs, right greater than left. Small effusions are suspected. No pneumothorax. Skeletal structures are demineralized, grossly intact. IMPRESSION: 1. Lower lung opacities, right greater than left, consistent with a combination of atelectasis and/or pneumonia and probable pleural effusions. There is also diffuse interstitial thickening increased when compared to the prior chest radiographs. Consider interstitial pulmonary edema in the proper clinical setting. Underlying COPD. Electronically Signed   By: Amie Portland M.D.   On: 08/30/2022 10:33        Scheduled Meds:  apixaban  2.5 mg Oral BID   aspirin EC  81 mg Oral Daily   furosemide  20 mg Intravenous BID   ipratropium  0.5 mg Nebulization Q6H WA   metoprolol tartrate  12.5 mg Oral BID   mometasone-formoterol  2 puff Inhalation BID   And   umeclidinium bromide  1 puff Inhalation Daily   PARoxetine  20 mg Oral Daily   pravastatin  40 mg Oral Daily   sodium chloride flush  3 mL Intravenous Q12H   Continuous Infusions:   amiodarone 30 mg/hr (08/31/22 0816)     LOS: 1 day   Time spent:  Azucena Fallen, DO [facility]  If 7PM-7AM, please contact night-coverage [email or web address]  08/31/2022, 8:43 AM

## 2022-09-01 DIAGNOSIS — J9621 Acute and chronic respiratory failure with hypoxia: Secondary | ICD-10-CM | POA: Diagnosis not present

## 2022-09-01 LAB — BASIC METABOLIC PANEL
Anion gap: 8 (ref 5–15)
BUN: 28 mg/dL — ABNORMAL HIGH (ref 8–23)
CO2: 29 mmol/L (ref 22–32)
Calcium: 8.1 mg/dL — ABNORMAL LOW (ref 8.9–10.3)
Chloride: 99 mmol/L (ref 98–111)
Creatinine, Ser: 1.53 mg/dL — ABNORMAL HIGH (ref 0.44–1.00)
GFR, Estimated: 34 mL/min — ABNORMAL LOW (ref >60)
Glucose, Bld: 124 mg/dL — ABNORMAL HIGH (ref 70–99)
Potassium: 3.6 mmol/L (ref 3.5–5.1)
Sodium: 136 mmol/L (ref 135–145)

## 2022-09-01 LAB — CBC
HCT: 29.4 % — ABNORMAL LOW (ref 36.0–46.0)
Hemoglobin: 9.7 g/dL — ABNORMAL LOW (ref 12.0–15.0)
MCH: 31.3 pg (ref 26.0–34.0)
MCHC: 33 g/dL (ref 30.0–36.0)
MCV: 94.8 fL (ref 80.0–100.0)
Platelets: 157 10*3/uL (ref 150–400)
RBC: 3.1 MIL/uL — ABNORMAL LOW (ref 3.87–5.11)
RDW: 13.5 % (ref 11.5–15.5)
WBC: 9.7 10*3/uL (ref 4.0–10.5)
nRBC: 0 % (ref 0.0–0.2)

## 2022-09-01 MED ORDER — AMIODARONE HCL 200 MG PO TABS: 200.0000 mg | ORAL_TABLET | Freq: Every day | ORAL | 0 refills | Status: DC

## 2022-09-01 MED ORDER — DOXYCYCLINE HYCLATE 100 MG PO TABS: 100.0000 mg | ORAL_TABLET | Freq: Two times a day (BID) | ORAL | Status: DC

## 2022-09-01 MED ORDER — DOXYCYCLINE HYCLATE 100 MG PO TABS: 100.0000 mg | ORAL_TABLET | Freq: Two times a day (BID) | ORAL | 0 refills | Status: DC

## 2022-09-01 MED ORDER — PREDNISONE 10 MG PO TABS: ORAL_TABLET | ORAL | 0 refills | Status: AC

## 2022-09-01 NOTE — TOC Initial Note (Signed)
Transition of Care [facility]) - Initial/Assessment Note    Patient Details  Name: Heather Molina MRN: [id number] Date of Birth: March 27, 1940  Transition of [facility]) CM/SW Contact:    Gala Lewandowsky, RN Phone Number: 09/01/2022, 11:37 AM  Clinical Narrative: Patient presented for shortness of breath. PTA patient was from home with the support of daughter Heather Molina. Patient provided Case Manager verbal permission to speak with Heather Molina. Daughter states patient is active with [facility] for Home Health PT- added RN for disease and medication management. Patient will need orders for [facility] RN/PT once stable. Patient has DME rolling walker and oxygen via [facility] at 2 Liters- confirmed liter flow. Patient has a 5 liter concentrator in the home. Case Manager will continue to follow for additional transition of care needs as the patient progresses.            Expected Discharge Plan: Home w Home Health Services Barriers to Discharge: No Barriers Identified  Patient Goals and CMS Choice Patient states their goals for this hospitalization and ongoing recovery are:: plan will be to return home.   Choice offered to / list presented to : NA  Expected Discharge Plan and Services In-house Referral: NA Discharge Planning Services: CM Consult Post Acute Care Choice: Home Health, Resumption of Svcs/PTA Provider Living arrangements for the past 2 months: Single Family Home                   DME Agency: NA  HH Arranged: RN, Disease Management, PT HH Agency: [facility] Date [facility] Agency Contacted: 09/01/22 Time HH Agency Contacted: 1136 Representative spoke with at [facility] Agency: Elnita Maxwell  Prior Living Arrangements/Services Living arrangements for the past 2 months: Single Family Home Lives with:: Adult Children Patient language and need for interpreter reviewed:: Yes Do you feel safe going back to the place where you live?: Yes      Need for Family Participation in Patient Care:  Yes (Comment) Care giver support system in place?: Yes (comment) Current home services: DME (rolling walker and oxygen via [facility] [employer]) Criminal Activity/Legal Involvement Pertinent to Current Situation/Hospitalization: No - Comment as needed  Activities of Daily Living Home Assistive Devices/Equipment: Oxygen ADL Screening (condition at time of admission) Patient's cognitive ability adequate to safely complete daily activities?: Yes Is the patient deaf or have difficulty hearing?: No Does the patient have difficulty seeing, even when wearing glasses/contacts?: No Does the patient have difficulty concentrating, remembering, or making decisions?: No Patient able to express need for assistance with ADLs?: Yes Does the patient have difficulty dressing or bathing?: No Independently performs ADLs?: Yes (appropriate for developmental age) Does the patient have difficulty walking or climbing stairs?: No Weakness of Legs: None Weakness of Arms/Hands: None  Permission Sought/Granted Permission sought to share information with : Case Manager, Family Supports, Oceanographer granted to share information with : Yes, Verbal Permission Granted     Permission granted to share info w AGENCY: [facility]    Emotional Assessment Appearance:: Appears stated age Attitude/Demeanor/Rapport: Engaged Affect (typically observed): Appropriate Orientation: : Oriented to Self, Oriented to Place Alcohol / Substance Use: Not Applicable Psych Involvement: No (comment)  Admission diagnosis:  Acute on chronic respiratory failure with hypoxia (HCC) [J96.21] Patient Active Problem List   Diagnosis Date Noted   Acute on chronic respiratory failure with hypoxia (HCC) 08/30/2022   Elevated brain natriuretic peptide (BNP) level 08/30/2022   COPD with acute exacerbation (HCC) 08/30/2022  Paroxysmal atrial fibrillation (HCC) 06/29/2022   CAD (coronary artery  disease) 06/29/2022   Hypertension 06/02/2022   Acute foot pain, right 04/15/2022   Respiratory infection 04/15/2022   History of CVA (cerebrovascular accident) 12/26/2020   Cigarette nicotine dependence without complication 12/26/2020   Irritable bowel syndrome with diarrhea 09/27/2019   Generalized edema 08/08/2019   CAP (community acquired pneumonia) 05/13/2019   Hypoxia 05/13/2019   NSVT (nonsustained ventricular tachycardia) (HCC) 05/13/2019   Leukocytosis 05/13/2019   Prolonged Q-T interval on ECG 05/13/2019   Anxiety 05/13/2019   Lipoma of torso 04/07/2019   Sebaceous cyst 04/07/2019   Age-related osteoporosis without current pathological fracture 04/06/2018   Arthritis, multiple joint involvement 04/06/2018   Mixed dyslipidemia 04/06/2018   Recurrent major depressive disorder, in full remission (HCC) 04/06/2018   Right inguinal hernia 03/02/2018   Calculus of gallbladder with chronic cholecystitis without obstruction 04/30/2016   TOBACCO ABUSE 05/21/2009   ALLERGIC RHINITIS 05/20/2009   C O P D 05/20/2009   G E R D 05/20/2009   PCP:  Hadley Pen, MD Pharmacy:   [address] - [place], [place] - [address] [place] [address] Phone: [phone] Fax: [phone]  [facility]) - [place], [place] - [address] [facility] [address] [place] [address] [place]  [address] Phone: [phone] Fax: [phone]  Social Determinants of Health (SDOH) Social History: SDOH Screenings   Food Insecurity: No Food Insecurity (08/30/2022)  Housing: Low Risk  (08/30/2022)  Transportation Needs: No Transportation Needs (08/30/2022)  Utilities: Not At Risk (08/30/2022)  Tobacco Use: Medium Risk (08/30/2022)   Readmission Risk Interventions     No data to display

## 2022-09-01 NOTE — Plan of Care (Signed)

## 2022-09-01 NOTE — Discharge Summary (Signed)
Physician Discharge Summary  LARRAINE LANGWORTHY ZOX:[id number] DOB: [birth year] DOA: 08/30/2022  PCP: Hadley Pen, MD  Admit date: 08/30/2022 Discharge date: 09/01/2022  Admitted From: Home Disposition: Home  Recommendations for Outpatient Follow-up:  Follow up with PCP in 1-2 weeks Please obtain BMP/CBC in one week  Home Health: None Equipment/Devices: Continue oxygen, 2 L  Discharge Condition: Stable CODE STATUS: Full Diet recommendation: Low-salt low-fat diet  Brief/Interim Summary: Heather Molina is a 82 y.o. female with medical history significant of A-fib, NSVT, hypertension, hyperlipidemia, GERD, stroke, COPD, CAD, depression, anxiety, prolonged QTc, IBS, chronic respiratory failure with hypoxia presenting with shortness of breath and acute hypoxia from baseline with abnormal chest xray. Hospitalist called for admission.  Patient admitted with multifactorial hypoxia, acute on chronic with notable community versus aspiration pneumonia meeting severe sepsis criteria.  Patient's symptoms resolved quite rapidly on IV antibiotics and steroids.  Patient may have ongoing COPD exacerbation overlying which also would improve with antibiotics and steroids.  Minimal vascular congestion on chest x-ray, furosemide given but given elevated creatinine and echocardiogram with mild grade 2 diastolic dysfunction and EF 55% diuretics are likely not necessary.  Regardless patient's hypoxia resolved although she does require 2 L nasal cannula now her sats at rest are borderline even off oxygen indicating she may be able to wean off oxygen completely in the next few weeks pending ongoing physical therapy evaluation at home.  Patient's other chronic comorbid conditions including A-fib hypertension hyperlipidemia CAD depression anxiety diabetes appears well-controlled.  Patient did have elevated creatinine which appears to be stabilizing after discontinuation of diuretics.  A-fib noted to be in RVR at intake likely  provoked by a severe sepsis also resolved.  Follow-up with PCP in the next 1 to 2 weeks.  Patient otherwise stable and agreeable for discharge home  Discharge Diagnoses:  Principal Problem:   Acute on chronic respiratory failure with hypoxia (HCC) Active Problems:   G E R D   CAP (community acquired pneumonia)   NSVT (nonsustained ventricular tachycardia) (HCC)   Prolonged Q-T interval on ECG   Anxiety   Hypertension   History of CVA (cerebrovascular accident)   Irritable bowel syndrome with diarrhea   Mixed dyslipidemia   Recurrent major depressive disorder, in full remission (HCC)   Paroxysmal atrial fibrillation (HCC)   CAD (coronary artery disease)   Elevated brain natriuretic peptide (BNP) level   COPD with acute exacerbation (HCC)    Discharge Instructions   Allergies as of 09/01/2022       Reactions   Ceftriaxone Hives, Shortness Of Breath, Swelling   Albuterol Other (See Comments)   HEART PALPATIONS AND JITTERY        Medication List     TAKE these medications    acetaminophen 500 MG tablet Commonly known as: TYLENOL Take 500 mg by mouth as needed for moderate pain.   amiodarone 200 MG tablet Commonly known as: PACERONE Take 1 tablet (200 mg total) by mouth daily. Start taking on: September 02, 2022   aspirin EC 81 MG tablet Take 81 mg by mouth daily.   Breztri Aerosphere 160-9-4.8 MCG/ACT Aero Generic drug: Budeson-Glycopyrrol-Formoterol Inhale 2 puffs into the lungs in the morning and at bedtime.   diphenoxylate-atropine 2.5-0.025 MG tablet Commonly known as: LOMOTIL Take 0.5 tablets by mouth as needed for diarrhea or loose stools.   doxycycline 100 MG tablet Commonly known as: VIBRA-TABS Take 1 tablet (100 mg total) by mouth every 12 (twelve) hours.   Eliquis 2.5  Physician Discharge Summary  LARRAINE LANGWORTHY ZOX:[id number] DOB: [birth year] DOA: 08/30/2022  PCP: Hadley Pen, MD  Admit date: 08/30/2022 Discharge date: 09/01/2022  Admitted From: Home Disposition: Home  Recommendations for Outpatient Follow-up:  Follow up with PCP in 1-2 weeks Please obtain BMP/CBC in one week  Home Health: None Equipment/Devices: Continue oxygen, 2 L  Discharge Condition: Stable CODE STATUS: Full Diet recommendation: Low-salt low-fat diet  Brief/Interim Summary: Heather Molina is a 82 y.o. female with medical history significant of A-fib, NSVT, hypertension, hyperlipidemia, GERD, stroke, COPD, CAD, depression, anxiety, prolonged QTc, IBS, chronic respiratory failure with hypoxia presenting with shortness of breath and acute hypoxia from baseline with abnormal chest xray. Hospitalist called for admission.  Patient admitted with multifactorial hypoxia, acute on chronic with notable community versus aspiration pneumonia meeting severe sepsis criteria.  Patient's symptoms resolved quite rapidly on IV antibiotics and steroids.  Patient may have ongoing COPD exacerbation overlying which also would improve with antibiotics and steroids.  Minimal vascular congestion on chest x-ray, furosemide given but given elevated creatinine and echocardiogram with mild grade 2 diastolic dysfunction and EF 55% diuretics are likely not necessary.  Regardless patient's hypoxia resolved although she does require 2 L nasal cannula now her sats at rest are borderline even off oxygen indicating she may be able to wean off oxygen completely in the next few weeks pending ongoing physical therapy evaluation at home.  Patient's other chronic comorbid conditions including A-fib hypertension hyperlipidemia CAD depression anxiety diabetes appears well-controlled.  Patient did have elevated creatinine which appears to be stabilizing after discontinuation of diuretics.  A-fib noted to be in RVR at intake likely  provoked by a severe sepsis also resolved.  Follow-up with PCP in the next 1 to 2 weeks.  Patient otherwise stable and agreeable for discharge home  Discharge Diagnoses:  Principal Problem:   Acute on chronic respiratory failure with hypoxia (HCC) Active Problems:   G E R D   CAP (community acquired pneumonia)   NSVT (nonsustained ventricular tachycardia) (HCC)   Prolonged Q-T interval on ECG   Anxiety   Hypertension   History of CVA (cerebrovascular accident)   Irritable bowel syndrome with diarrhea   Mixed dyslipidemia   Recurrent major depressive disorder, in full remission (HCC)   Paroxysmal atrial fibrillation (HCC)   CAD (coronary artery disease)   Elevated brain natriuretic peptide (BNP) level   COPD with acute exacerbation (HCC)    Discharge Instructions   Allergies as of 09/01/2022       Reactions   Ceftriaxone Hives, Shortness Of Breath, Swelling   Albuterol Other (See Comments)   HEART PALPATIONS AND JITTERY        Medication List     TAKE these medications    acetaminophen 500 MG tablet Commonly known as: TYLENOL Take 500 mg by mouth as needed for moderate pain.   amiodarone 200 MG tablet Commonly known as: PACERONE Take 1 tablet (200 mg total) by mouth daily. Start taking on: September 02, 2022   aspirin EC 81 MG tablet Take 81 mg by mouth daily.   Breztri Aerosphere 160-9-4.8 MCG/ACT Aero Generic drug: Budeson-Glycopyrrol-Formoterol Inhale 2 puffs into the lungs in the morning and at bedtime.   diphenoxylate-atropine 2.5-0.025 MG tablet Commonly known as: LOMOTIL Take 0.5 tablets by mouth as needed for diarrhea or loose stools.   doxycycline 100 MG tablet Commonly known as: VIBRA-TABS Take 1 tablet (100 mg total) by mouth every 12 (twelve) hours.   Eliquis 2.5  Physician Discharge Summary  LARRAINE LANGWORTHY ZOX:[id number] DOB: [birth year] DOA: 08/30/2022  PCP: Hadley Pen, MD  Admit date: 08/30/2022 Discharge date: 09/01/2022  Admitted From: Home Disposition: Home  Recommendations for Outpatient Follow-up:  Follow up with PCP in 1-2 weeks Please obtain BMP/CBC in one week  Home Health: None Equipment/Devices: Continue oxygen, 2 L  Discharge Condition: Stable CODE STATUS: Full Diet recommendation: Low-salt low-fat diet  Brief/Interim Summary: Heather Molina is a 82 y.o. female with medical history significant of A-fib, NSVT, hypertension, hyperlipidemia, GERD, stroke, COPD, CAD, depression, anxiety, prolonged QTc, IBS, chronic respiratory failure with hypoxia presenting with shortness of breath and acute hypoxia from baseline with abnormal chest xray. Hospitalist called for admission.  Patient admitted with multifactorial hypoxia, acute on chronic with notable community versus aspiration pneumonia meeting severe sepsis criteria.  Patient's symptoms resolved quite rapidly on IV antibiotics and steroids.  Patient may have ongoing COPD exacerbation overlying which also would improve with antibiotics and steroids.  Minimal vascular congestion on chest x-ray, furosemide given but given elevated creatinine and echocardiogram with mild grade 2 diastolic dysfunction and EF 55% diuretics are likely not necessary.  Regardless patient's hypoxia resolved although she does require 2 L nasal cannula now her sats at rest are borderline even off oxygen indicating she may be able to wean off oxygen completely in the next few weeks pending ongoing physical therapy evaluation at home.  Patient's other chronic comorbid conditions including A-fib hypertension hyperlipidemia CAD depression anxiety diabetes appears well-controlled.  Patient did have elevated creatinine which appears to be stabilizing after discontinuation of diuretics.  A-fib noted to be in RVR at intake likely  provoked by a severe sepsis also resolved.  Follow-up with PCP in the next 1 to 2 weeks.  Patient otherwise stable and agreeable for discharge home  Discharge Diagnoses:  Principal Problem:   Acute on chronic respiratory failure with hypoxia (HCC) Active Problems:   G E R D   CAP (community acquired pneumonia)   NSVT (nonsustained ventricular tachycardia) (HCC)   Prolonged Q-T interval on ECG   Anxiety   Hypertension   History of CVA (cerebrovascular accident)   Irritable bowel syndrome with diarrhea   Mixed dyslipidemia   Recurrent major depressive disorder, in full remission (HCC)   Paroxysmal atrial fibrillation (HCC)   CAD (coronary artery disease)   Elevated brain natriuretic peptide (BNP) level   COPD with acute exacerbation (HCC)    Discharge Instructions   Allergies as of 09/01/2022       Reactions   Ceftriaxone Hives, Shortness Of Breath, Swelling   Albuterol Other (See Comments)   HEART PALPATIONS AND JITTERY        Medication List     TAKE these medications    acetaminophen 500 MG tablet Commonly known as: TYLENOL Take 500 mg by mouth as needed for moderate pain.   amiodarone 200 MG tablet Commonly known as: PACERONE Take 1 tablet (200 mg total) by mouth daily. Start taking on: September 02, 2022   aspirin EC 81 MG tablet Take 81 mg by mouth daily.   Breztri Aerosphere 160-9-4.8 MCG/ACT Aero Generic drug: Budeson-Glycopyrrol-Formoterol Inhale 2 puffs into the lungs in the morning and at bedtime.   diphenoxylate-atropine 2.5-0.025 MG tablet Commonly known as: LOMOTIL Take 0.5 tablets by mouth as needed for diarrhea or loose stools.   doxycycline 100 MG tablet Commonly known as: VIBRA-TABS Take 1 tablet (100 mg total) by mouth every 12 (twelve) hours.   Eliquis 2.5  MG Tabs tablet Generic drug: apixaban Take 2.5 mg by mouth 2 (two) times daily.   levalbuterol 1.25 MG/0.5ML nebulizer solution Commonly known as: XOPENEX Take 1.25 mg by  nebulization as needed for wheezing or shortness of breath.   metoprolol tartrate 25 MG tablet Commonly known as: LOPRESSOR Take 12.5 mg by mouth 2 (two) times daily.   PARoxetine 20 MG tablet Commonly known as: PAXIL Take 20 mg by mouth daily.   pravastatin 40 MG tablet Commonly known as: PRAVACHOL Take 40 mg by mouth daily.   predniSONE 10 MG tablet Commonly known as: DELTASONE Take 4 tablets (40 mg total) by mouth daily for 3 days, THEN 3 tablets (30 mg total) daily for 3 days, THEN 2 tablets (20 mg total) daily for 3 days, THEN 1 tablet (10 mg total) daily for 3 days. Start taking on: September 01, 2022   tobramycin 0.3 % ophthalmic solution Commonly known as: TOBREX Place 1-4 drops into the left eye See admin instructions. Instil 1 drop into the left eye the day before injection. Instill 2 drops  into the left eye at breakfast, lunch, and bedtime the day of injection. Instill 4 drops  into the left eye the morning after injection.   Vitamin D 50 MCG (2000 UT) Caps Take 2,000 Units by mouth daily.        Follow-up Information     [facility], [facility] Follow up.   Why: Physical Therapy and Registered Nurse-office to call with visit times. Contact information: [address] [place] [address] [phone]                Allergies  Allergen Reactions   Ceftriaxone Hives, Shortness Of Breath and Swelling   Albuterol Other (See Comments)    HEART PALPATIONS AND JITTERY    Consultations: None  Procedures/Studies: ECHOCARDIOGRAM COMPLETE  Result Date: 08/31/2022    ECHOCARDIOGRAM REPORT   Patient Name:   Heather Molina Bryant Date of Exam: 08/31/2022 Medical Rec #:  [id number]    Height:       60.0 in Accession #:    [id number]   Weight:       110.9 lb Date of Birth:  January 24, 1941     BSA:          1.453 m Patient Age:    82 years     BP:           144/69 mmHg Patient Gender: F            HR:           73 bpm. Exam Location:  Inpatient Procedure: 2D Echo, Color  Doppler and Cardiac Doppler Indications:    R06.9 DOE  History:        Patient has prior history of Echocardiogram examinations, most                 recent 05/13/2022. CAD, COPD, Arrythmias:Atrial Fibrillation;                 Risk Factors:Hypertension and Dyslipidemia.  Sonographer:    Irving Burton Senior RDCS Referring Phys: [id number] Cecille Po MELVIN IMPRESSIONS  1. Left ventricular ejection fraction, by estimation, is 55%. The left ventricle has normal function. The left ventricle has no regional wall motion abnormalities. There is mild concentric left ventricular hypertrophy. Left ventricular diastolic parameters are consistent with Grade II diastolic dysfunction (pseudonormalization).  2. Right ventricular systolic function is normal. The right ventricular size is normal. There is moderately  MG Tabs tablet Generic drug: apixaban Take 2.5 mg by mouth 2 (two) times daily.   levalbuterol 1.25 MG/0.5ML nebulizer solution Commonly known as: XOPENEX Take 1.25 mg by  nebulization as needed for wheezing or shortness of breath.   metoprolol tartrate 25 MG tablet Commonly known as: LOPRESSOR Take 12.5 mg by mouth 2 (two) times daily.   PARoxetine 20 MG tablet Commonly known as: PAXIL Take 20 mg by mouth daily.   pravastatin 40 MG tablet Commonly known as: PRAVACHOL Take 40 mg by mouth daily.   predniSONE 10 MG tablet Commonly known as: DELTASONE Take 4 tablets (40 mg total) by mouth daily for 3 days, THEN 3 tablets (30 mg total) daily for 3 days, THEN 2 tablets (20 mg total) daily for 3 days, THEN 1 tablet (10 mg total) daily for 3 days. Start taking on: September 01, 2022   tobramycin 0.3 % ophthalmic solution Commonly known as: TOBREX Place 1-4 drops into the left eye See admin instructions. Instil 1 drop into the left eye the day before injection. Instill 2 drops  into the left eye at breakfast, lunch, and bedtime the day of injection. Instill 4 drops  into the left eye the morning after injection.   Vitamin D 50 MCG (2000 UT) Caps Take 2,000 Units by mouth daily.        Follow-up Information     Care, [facility] Follow up.   Why: Physical Therapy and Registered Nurse-office to call with visit times. Contact information: [address] [place] [address] [phone]                Allergies  Allergen Reactions   Ceftriaxone Hives, Shortness Of Breath and Swelling   Albuterol Other (See Comments)    HEART PALPATIONS AND JITTERY    Consultations: None  Procedures/Studies: ECHOCARDIOGRAM COMPLETE  Result Date: 08/31/2022    ECHOCARDIOGRAM REPORT   Patient Name:   Heather Molina Bryant Date of Exam: 08/31/2022 Medical Rec #:  [id number]    Height:       60.0 in Accession #:    [id number]   Weight:       110.9 lb Date of Birth:  January 24, 1941     BSA:          1.453 m Patient Age:    82 years     BP:           144/69 mmHg Patient Gender: F            HR:           73 bpm. Exam Location:  Inpatient Procedure: 2D Echo, Color  Doppler and Cardiac Doppler Indications:    R06.9 DOE  History:        Patient has prior history of Echocardiogram examinations, most                 recent 05/13/2022. CAD, COPD, Arrythmias:Atrial Fibrillation;                 Risk Factors:Hypertension and Dyslipidemia.  Sonographer:    Irving Burton Senior RDCS Referring Phys: [id number] Cecille Po MELVIN IMPRESSIONS  1. Left ventricular ejection fraction, by estimation, is 55%. The left ventricle has normal function. The left ventricle has no regional wall motion abnormalities. There is mild concentric left ventricular hypertrophy. Left ventricular diastolic parameters are consistent with Grade II diastolic dysfunction (pseudonormalization).  2. Right ventricular systolic function is normal. The right ventricular size is normal. There is moderately  MG Tabs tablet Generic drug: apixaban Take 2.5 mg by mouth 2 (two) times daily.   levalbuterol 1.25 MG/0.5ML nebulizer solution Commonly known as: XOPENEX Take 1.25 mg by  nebulization as needed for wheezing or shortness of breath.   metoprolol tartrate 25 MG tablet Commonly known as: LOPRESSOR Take 12.5 mg by mouth 2 (two) times daily.   PARoxetine 20 MG tablet Commonly known as: PAXIL Take 20 mg by mouth daily.   pravastatin 40 MG tablet Commonly known as: PRAVACHOL Take 40 mg by mouth daily.   predniSONE 10 MG tablet Commonly known as: DELTASONE Take 4 tablets (40 mg total) by mouth daily for 3 days, THEN 3 tablets (30 mg total) daily for 3 days, THEN 2 tablets (20 mg total) daily for 3 days, THEN 1 tablet (10 mg total) daily for 3 days. Start taking on: September 01, 2022   tobramycin 0.3 % ophthalmic solution Commonly known as: TOBREX Place 1-4 drops into the left eye See admin instructions. Instil 1 drop into the left eye the day before injection. Instill 2 drops  into the left eye at breakfast, lunch, and bedtime the day of injection. Instill 4 drops  into the left eye the morning after injection.   Vitamin D 50 MCG (2000 UT) Caps Take 2,000 Units by mouth daily.        Follow-up Information     [facility], [facility] Follow up.   Why: Physical Therapy and Registered Nurse-office to call with visit times. Contact information: [address] [place] [address] [phone]                Allergies  Allergen Reactions   Ceftriaxone Hives, Shortness Of Breath and Swelling   Albuterol Other (See Comments)    HEART PALPATIONS AND JITTERY    Consultations: None  Procedures/Studies: ECHOCARDIOGRAM COMPLETE  Result Date: 08/31/2022    ECHOCARDIOGRAM REPORT   Patient Name:   Heather Molina Bryant Date of Exam: 08/31/2022 Medical Rec #:  [id number]    Height:       60.0 in Accession #:    [id number]   Weight:       110.9 lb Date of Birth:  January 24, 1941     BSA:          1.453 m Patient Age:    82 years     BP:           144/69 mmHg Patient Gender: F            HR:           73 bpm. Exam Location:  Inpatient Procedure: 2D Echo, Color  Doppler and Cardiac Doppler Indications:    R06.9 DOE  History:        Patient has prior history of Echocardiogram examinations, most                 recent 05/13/2022. CAD, COPD, Arrythmias:Atrial Fibrillation;                 Risk Factors:Hypertension and Dyslipidemia.  Sonographer:    Irving Burton Senior RDCS Referring Phys: [id number] Cecille Po MELVIN IMPRESSIONS  1. Left ventricular ejection fraction, by estimation, is 55%. The left ventricle has normal function. The left ventricle has no regional wall motion abnormalities. There is mild concentric left ventricular hypertrophy. Left ventricular diastolic parameters are consistent with Grade II diastolic dysfunction (pseudonormalization).  2. Right ventricular systolic function is normal. The right ventricular size is normal. There is moderately  MG Tabs tablet Generic drug: apixaban Take 2.5 mg by mouth 2 (two) times daily.   levalbuterol 1.25 MG/0.5ML nebulizer solution Commonly known as: XOPENEX Take 1.25 mg by  nebulization as needed for wheezing or shortness of breath.   metoprolol tartrate 25 MG tablet Commonly known as: LOPRESSOR Take 12.5 mg by mouth 2 (two) times daily.   PARoxetine 20 MG tablet Commonly known as: PAXIL Take 20 mg by mouth daily.   pravastatin 40 MG tablet Commonly known as: PRAVACHOL Take 40 mg by mouth daily.   predniSONE 10 MG tablet Commonly known as: DELTASONE Take 4 tablets (40 mg total) by mouth daily for 3 days, THEN 3 tablets (30 mg total) daily for 3 days, THEN 2 tablets (20 mg total) daily for 3 days, THEN 1 tablet (10 mg total) daily for 3 days. Start taking on: September 01, 2022   tobramycin 0.3 % ophthalmic solution Commonly known as: TOBREX Place 1-4 drops into the left eye See admin instructions. Instil 1 drop into the left eye the day before injection. Instill 2 drops  into the left eye at breakfast, lunch, and bedtime the day of injection. Instill 4 drops  into the left eye the morning after injection.   Vitamin D 50 MCG (2000 UT) Caps Take 2,000 Units by mouth daily.        Follow-up Information     Care, [facility] Follow up.   Why: Physical Therapy and Registered Nurse-office to call with visit times. Contact information: [address] [place] [address] [phone]                Allergies  Allergen Reactions   Ceftriaxone Hives, Shortness Of Breath and Swelling   Albuterol Other (See Comments)    HEART PALPATIONS AND JITTERY    Consultations: None  Procedures/Studies: ECHOCARDIOGRAM COMPLETE  Result Date: 08/31/2022    ECHOCARDIOGRAM REPORT   Patient Name:   Heather Molina Bryant Date of Exam: 08/31/2022 Medical Rec #:  [id number]    Height:       60.0 in Accession #:    [id number]   Weight:       110.9 lb Date of Birth:  January 24, 1941     BSA:          1.453 m Patient Age:    82 years     BP:           144/69 mmHg Patient Gender: F            HR:           73 bpm. Exam Location:  Inpatient Procedure: 2D Echo, Color  Doppler and Cardiac Doppler Indications:    R06.9 DOE  History:        Patient has prior history of Echocardiogram examinations, most                 recent 05/13/2022. CAD, COPD, Arrythmias:Atrial Fibrillation;                 Risk Factors:Hypertension and Dyslipidemia.  Sonographer:    Irving Burton Senior RDCS Referring Phys: [id number] Cecille Po MELVIN IMPRESSIONS  1. Left ventricular ejection fraction, by estimation, is 55%. The left ventricle has normal function. The left ventricle has no regional wall motion abnormalities. There is mild concentric left ventricular hypertrophy. Left ventricular diastolic parameters are consistent with Grade II diastolic dysfunction (pseudonormalization).  2. Right ventricular systolic function is normal. The right ventricular size is normal. There is moderately  MG Tabs tablet Generic drug: apixaban Take 2.5 mg by mouth 2 (two) times daily.   levalbuterol 1.25 MG/0.5ML nebulizer solution Commonly known as: XOPENEX Take 1.25 mg by  nebulization as needed for wheezing or shortness of breath.   metoprolol tartrate 25 MG tablet Commonly known as: LOPRESSOR Take 12.5 mg by mouth 2 (two) times daily.   PARoxetine 20 MG tablet Commonly known as: PAXIL Take 20 mg by mouth daily.   pravastatin 40 MG tablet Commonly known as: PRAVACHOL Take 40 mg by mouth daily.   predniSONE 10 MG tablet Commonly known as: DELTASONE Take 4 tablets (40 mg total) by mouth daily for 3 days, THEN 3 tablets (30 mg total) daily for 3 days, THEN 2 tablets (20 mg total) daily for 3 days, THEN 1 tablet (10 mg total) daily for 3 days. Start taking on: September 01, 2022   tobramycin 0.3 % ophthalmic solution Commonly known as: TOBREX Place 1-4 drops into the left eye See admin instructions. Instil 1 drop into the left eye the day before injection. Instill 2 drops  into the left eye at breakfast, lunch, and bedtime the day of injection. Instill 4 drops  into the left eye the morning after injection.   Vitamin D 50 MCG (2000 UT) Caps Take 2,000 Units by mouth daily.        Follow-up Information     [facility], [facility] Follow up.   Why: Physical Therapy and Registered Nurse-office to call with visit times. Contact information: [address] [place] [address] [phone]                Allergies  Allergen Reactions   Ceftriaxone Hives, Shortness Of Breath and Swelling   Albuterol Other (See Comments)    HEART PALPATIONS AND JITTERY    Consultations: None  Procedures/Studies: ECHOCARDIOGRAM COMPLETE  Result Date: 08/31/2022    ECHOCARDIOGRAM REPORT   Patient Name:   Heather Molina Bryant Date of Exam: 08/31/2022 Medical Rec #:  [id number]    Height:       60.0 in Accession #:    [id number]   Weight:       110.9 lb Date of Birth:  January 24, 1941     BSA:          1.453 m Patient Age:    82 years     BP:           144/69 mmHg Patient Gender: F            HR:           73 bpm. Exam Location:  Inpatient Procedure: 2D Echo, Color  Doppler and Cardiac Doppler Indications:    R06.9 DOE  History:        Patient has prior history of Echocardiogram examinations, most                 recent 05/13/2022. CAD, COPD, Arrythmias:Atrial Fibrillation;                 Risk Factors:Hypertension and Dyslipidemia.  Sonographer:    Irving Burton Senior RDCS Referring Phys: [id number] Cecille Po MELVIN IMPRESSIONS  1. Left ventricular ejection fraction, by estimation, is 55%. The left ventricle has normal function. The left ventricle has no regional wall motion abnormalities. There is mild concentric left ventricular hypertrophy. Left ventricular diastolic parameters are consistent with Grade II diastolic dysfunction (pseudonormalization).  2. Right ventricular systolic function is normal. The right ventricular size is normal. There is moderately

## 2022-09-01 NOTE — Evaluation (Signed)
Physical Therapy Evaluation Patient Details Name: Heather Molina MRN: [id number] DOB: 22-Aug-1940 Today's Date: 09/01/2022  History of Present Illness  82 yo female presents to [facility] on 7/7 with SOB with SPO2 in 30s. PMH includes COPD, osteoporosis, anxiety, PNA, CVA L BG 2022, HTN, afib.  Clinical Impression   Pt presents with generalized weakness, min imbalance during gait, impaired activity tolerance vs baseline, and increased O2 requirements during mobility vs baseline. Pt to benefit from acute PT to address deficits. Pt ambulated good hallway distance with min unsteadiness, mostly during challenge, but improved with further distance. Pt required 2LO2 to maintain SPO2 >88% during mobility, RN notified. Pt active with HHPT. PT to progress mobility as tolerated, and will continue to follow acutely.      SATURATION QUALIFICATIONS: (This note is used to comply with regulatory documentation for home oxygen)  Patient Saturations on Room Air at Rest = 85%  Patient Saturations on Room Air while Ambulating = --%  Patient Saturations on 2 Liters of oxygen while Ambulating = 94%  Please briefly explain why patient needs home oxygen: to maintain SPO2 >88% during mobility     Assistance Recommended at Discharge Set up Supervision/Assistance  If plan is discharge home, recommend the following:  Can travel by private vehicle  A little help with walking and/or transfers        Equipment Recommendations None recommended by PT  Recommendations for Other Services       Functional Status Assessment Patient has had a recent decline in their functional status and demonstrates the ability to make significant improvements in function in a reasonable and predictable amount of time.     Precautions / Restrictions Precautions Precautions: Fall Precaution Comments: 1LO2 chronically, requiring 2LO2 during session Restrictions Weight Bearing Restrictions: No      Mobility  Bed Mobility Overal bed  mobility: Needs Assistance Bed Mobility: Supine to Sit     Supine to sit: Min assist     General bed mobility comments: light assist for trunk elevation    Transfers Overall transfer level: Needs assistance Equipment used: None Transfers: Sit to/from Stand Sit to Stand: Min guard           General transfer comment: close guard for safety, slow to rise/sit    Ambulation/Gait Ambulation/Gait assistance: Min guard Gait Distance (Feet): 350 Feet Assistive device: None Gait Pattern/deviations: Step-through pattern, Decreased stride length Gait velocity: decr     General Gait Details: close guard for safety, min unsteadiness initially but improved with further distance. SPO2 94% and greater on 2LO2  Stairs            Wheelchair Mobility     Tilt Bed    Modified Rankin (Stroke Patients Only)       Balance Overall balance assessment: Needs assistance Sitting-balance support: No upper extremity supported, Feet unsupported Sitting balance-Leahy Scale: Fair     Standing balance support: No upper extremity supported Standing balance-Leahy Scale: Fair Standing balance comment: mild unsteadiness with head turns, step over object                             Pertinent Vitals/Pain Pain Assessment Pain Assessment: No/denies pain    Home Living Family/patient expects to be discharged to:: Private residence Living Arrangements: Children Available Help at Discharge: Family Type of Home: House Home Access: Ramped entrance;Stairs to enter   Entrance Stairs-Number of Steps: 3 or 4 short steps   Home Layout:  One level Home Equipment: Pharmacist, hospital (2 wheels);Cane - single point      Prior Function Prior Level of Function : Independent/Modified Independent                     Hand Dominance   Dominant Hand: Right    Extremity/Trunk Assessment   Upper Extremity Assessment Upper Extremity Assessment: Defer to OT evaluation     Lower Extremity Assessment Lower Extremity Assessment: Generalized weakness    Cervical / Trunk Assessment Cervical / Trunk Assessment: Normal  Communication   Communication: No difficulties  Cognition Arousal/Alertness: Awake/alert Behavior During Therapy: WFL for tasks assessed/performed Overall Cognitive Status: Within Functional Limits for tasks assessed                                          General Comments      Exercises     Assessment/Plan    PT Assessment Patient needs continued PT services  PT Problem List Decreased strength;Decreased activity tolerance;Decreased mobility;Decreased safety awareness;Cardiopulmonary status limiting activity;Decreased balance       PT Treatment Interventions DME instruction;Stair training;Therapeutic activities;Balance training;Gait training;Functional mobility training;Therapeutic exercise;Neuromuscular re-education    PT Goals (Current goals can be found in the Care Plan section)  Acute Rehab PT Goals Patient Stated Goal: home PT Goal Formulation: With patient Time For Goal Achievement: 09/15/22 Potential to Achieve Goals: Good    Frequency Min 1X/week     Co-evaluation               AM-PAC PT "6 Clicks" Mobility  Outcome Measure Help needed turning from your back to your side while in a flat bed without using bedrails?: None Help needed moving from lying on your back to sitting on the side of a flat bed without using bedrails?: None Help needed moving to and from a bed to a chair (including a wheelchair)?: None Help needed standing up from a chair using your arms (e.g., wheelchair or bedside chair)?: None Help needed to walk in hospital room?: A Little Help needed climbing 3-5 steps with a railing? : A Little 6 Click Score: 22    End of Session Equipment Utilized During Treatment: Oxygen Activity Tolerance: Patient tolerated treatment well Patient left: in bed;with call bell/phone within  reach;with bed alarm set Nurse Communication: Mobility status PT Visit Diagnosis: Other abnormalities of gait and mobility (R26.89)    Time: 1610-9604 PT Time Calculation (min) (ACUTE ONLY): 20 min   Charges:   PT Evaluation $PT Eval Low Complexity: 1 Low   PT General Charges $$ ACUTE PT VISIT: 1 Visit         Marye Round, PT DPT Acute Rehabilitation Services Secure Chat Preferred  Office [phone]   Keaghan Staton E Stroup 09/01/2022, 10:13 AM

## 2022-09-01 NOTE — Progress Notes (Signed)
Heart Failure Navigator Progress Note  Assessed for Heart & Vascular TOC clinic readiness.  Patient does not meet criteria due to EF 55%, Admission more COPD and PNA. .   Navigator will sign off at this time.   Rhae Hammock, BSN, Scientist, clinical (histocompatibility and immunogenetics) Only

## 2022-09-04 LAB — CULTURE, BLOOD (ROUTINE X 2)
Culture: NO GROWTH
Culture: NO GROWTH
Special Requests: ADEQUATE

## 2022-12-02 ENCOUNTER — Telehealth: Payer: Self-pay | Admitting: Cardiology

## 2022-12-02 NOTE — Telephone Encounter (Signed)
Appt made for pt with Dr. Tomie China.

## 2022-12-02 NOTE — Telephone Encounter (Signed)
  STAT if HR is under 50 or over 120 (normal HR is 60-100 beats per minute)  What is your heart rate? Lowest 40s  Do you have a log of your heart rate readings (document readings)?   Do you have any other symptoms? SOB, sleeping a lot, swelling on her left leg, been taking fluid pill    Pt's daughter said, pt is having low HR readings, lowest was 40. She said, pt is sleeping a lot, SOB and her left leg is welling, the swelling went down, she is taking fluid pill for a month now. She would like to get recommendation from Dr. Tomie China

## 2022-12-03 ENCOUNTER — Ambulatory Visit: Payer: Medicare Other | Attending: Cardiology | Admitting: Cardiology

## 2022-12-03 ENCOUNTER — Telehealth: Payer: Self-pay

## 2022-12-03 ENCOUNTER — Other Ambulatory Visit: Payer: Self-pay

## 2022-12-03 VITALS — BP 126/60 | HR 51 | Ht 60.0 in | Wt 110.6 lb

## 2022-12-03 DIAGNOSIS — Z8673 Personal history of transient ischemic attack (TIA), and cerebral infarction without residual deficits: Secondary | ICD-10-CM | POA: Diagnosis present

## 2022-12-03 DIAGNOSIS — E782 Mixed hyperlipidemia: Secondary | ICD-10-CM | POA: Insufficient documentation

## 2022-12-03 DIAGNOSIS — J441 Chronic obstructive pulmonary disease with (acute) exacerbation: Secondary | ICD-10-CM | POA: Insufficient documentation

## 2022-12-03 DIAGNOSIS — F17210 Nicotine dependence, cigarettes, uncomplicated: Secondary | ICD-10-CM | POA: Insufficient documentation

## 2022-12-03 DIAGNOSIS — I480 Paroxysmal atrial fibrillation: Secondary | ICD-10-CM | POA: Insufficient documentation

## 2022-12-03 MED ORDER — AMIODARONE HCL 200 MG PO TABS: 100.0000 mg | ORAL_TABLET | Freq: Every day | ORAL | 0 refills | Status: DC

## 2022-12-03 NOTE — Progress Notes (Signed)
Cardiology Office Note:    Date:  12/03/2022   ID:  Heather Molina, DOB Feb 20, 1941, MRN [id number]  PCP:  Hadley Pen, MD  Cardiologist:  Garwin Brothers, MD   Referring MD: Hadley Pen, MD    ASSESSMENT:    1. Mixed dyslipidemia   2. Paroxysmal atrial fibrillation (HCC)   3. Cigarette nicotine dependence without complication   4. History of CVA (cerebrovascular accident)   5. COPD with acute exacerbation (HCC)    PLAN:    In order of problems listed above:  Primary prevention stressed with the patient.  Importance of compliance with diet medication stressed and patient verbalized standing. Paroxysmal atrial fibrillation:I discussed with the patient atrial fibrillation, disease process. Management and therapy including rate and rhythm control, anticoagulation benefits and potential risks were discussed extensively with the patient. Patient had multiple questions which were answered to patient's satisfaction. Bradycardia: Patient was initiated on amiodarone at the hospital and she is taking 200 mg of amiodarone daily.  He is also taking beta-blocker.  I have discontinued her beta-blocker and reduce amiodarone to 100 mg daily.  She will keep a track of pulse blood pressure and bring the numbers to [place] in a week. COPD on oxygen: I discussed this with the patient at length.  She needs repeat pulmonary function tests and pulmonary evaluation in view of the fact that she is on amiodarone.  This can have pulmonary toxicities.  She agrees.  She and her family member will contact her pulmonologist for extensive evaluation on these issues. Patient will be seen in follow-up appointment in 6 months or earlier if the patient has any concerns.   Medication Adjustments/Labs and Tests Ordered: Current medicines are reviewed at length with the patient today.  Concerns regarding medicines are outlined above.  Orders Placed This Encounter  Procedures   TSH   EKG 12-Lead   No orders of the  defined types were placed in this encounter.    No chief complaint on file.    History of Present Illness:    Heather Molina is a 82 y.o. female.  Patient has past medical history of paroxysmal atrial fibrillation, COPD on oxygen supplement, essential hypertension.  She gives history of dyspnea on exertion.  This is stable according to the patient.  There is also history of significant bradycardia according to family member.  They have a watch which documents heart rate and it runs in the 30s at night.  No syncope.  At the time of my evaluation, the patient is alert awake oriented and in no distress.  Past Medical History:  Diagnosis Date   Acute foot pain, right 04/15/2022   Acute on chronic respiratory failure with hypoxia (HCC) 08/30/2022   Age-related osteoporosis without current pathological fracture 04/06/2018   ALLERGIC RHINITIS 05/20/2009   Qualifier: Diagnosis of   By: Excell Seltzer CMA, Lawson Fiscal      Replacing diagnoses that were inactivated after the 05/25/22 regulatory import   Anxiety    Arthritis, multiple joint involvement 04/06/2018   C O P D 05/20/2009   Qualifier: Diagnosis of   By: Excell Seltzer CMA, Lori      Replacing diagnoses that were inactivated after the 05/25/22 regulatory import   CAD (coronary artery disease) 06/29/2022   Calculus of gallbladder with chronic cholecystitis without obstruction 04/30/2016   CAP (community acquired pneumonia) 05/13/2019   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/26/2020   Cigarette nicotine dependence without complication 12/26/2020   COPD  with acute exacerbation (HCC) 08/30/2022   Elevated brain natriuretic peptide (BNP) level 08/30/2022   G E R D 05/20/2009   Qualifier: Diagnosis of   By: Excell Seltzer CMA, Lori       Generalized edema 08/08/2019   History of CVA (cerebrovascular accident) 12/26/2020   Hypertension    Hypoxia 05/13/2019   Irritable bowel syndrome with diarrhea 09/27/2019   Leukocytosis 05/13/2019   Lipoma of  torso 04/07/2019   Mixed dyslipidemia 04/06/2018   NSVT (nonsustained ventricular tachycardia) (HCC) 05/13/2019   Paroxysmal atrial fibrillation (HCC) 06/29/2022   Prolonged Q-T interval on ECG 05/13/2019   Recurrent major depressive disorder, in full remission (HCC) 04/06/2018   Respiratory infection 04/15/2022   Right inguinal hernia 03/02/2018   Sebaceous cyst 04/07/2019   TOBACCO ABUSE 05/21/2009   Qualifier: Diagnosis of   By: Craige Cotta MD, Vineet        Past Surgical History:  Procedure Laterality Date   CHOLECYSTECTOMY     HERNIA REPAIR      Current Medications: Current Meds  Medication Sig   acetaminophen (TYLENOL) 500 MG tablet Take 500 mg by mouth as needed for moderate pain.   amiodarone (PACERONE) 200 MG tablet Take 1 tablet (200 mg total) by mouth daily.   aspirin 81 MG EC tablet Take 81 mg by mouth daily.   BREZTRI AEROSPHERE 160-9-4.8 MCG/ACT AERO Inhale 2 puffs into the lungs in the morning and at bedtime.   Cholecalciferol (VITAMIN D) 50 MCG (2000 UT) CAPS Take 2,000 Units by mouth daily.   diphenoxylate-atropine (LOMOTIL) 2.5-0.025 MG tablet Take 0.5 tablets by mouth as needed for diarrhea or loose stools.   ELIQUIS 2.5 MG TABS tablet Take 2.5 mg by mouth 2 (two) times daily.   furosemide (LASIX) 20 MG tablet Take 1 tablet by mouth daily.   levalbuterol (XOPENEX) 1.25 MG/0.5ML nebulizer solution Take 1.25 mg by nebulization as needed for wheezing or shortness of breath.   metoprolol tartrate (LOPRESSOR) 25 MG tablet Take 12.5 mg by mouth 2 (two) times daily.   PARoxetine (PAXIL) 20 MG tablet Take 20 mg by mouth daily.   pravastatin (PRAVACHOL) 40 MG tablet Take 40 mg by mouth daily.     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    Social Determinants of Health   Financial Resource Strain: Not on file  Food Insecurity: Low Risk  (09/02/2022)   Received from [facility]   Hunger Vital Sign    Worried About Running Out of Food in the Last Year: Never true    Ran Out of Food in the Last Year: Never true  Transportation Needs: Not on file (09/02/2022)  Physical Activity: Not on file  Stress: Not on file  Social Connections: Not on file     Family History: The patient's family  history includes CVA in her father; Epilepsy in her father.  ROS:   Please see the history of present illness.    All other systems reviewed and are negative.  EKGs/Labs/Other Studies Reviewed:    The following studies were reviewed today: .Marland KitchenEKG Interpretation Date/Time:  Thursday December 03 2022 13:56:37 EDT Ventricular Rate:  51 PR Interval:    QRS Duration:  88 QT Interval:  474 QTC Calculation: 436 R Axis:   88  Text Interpretation: Junctional rhythm Cannot rule out Anterior infarct (cited on or before 30-Aug-2022) Abnormal ECG When compared with ECG of 30-Aug-2022 16:20, Junctional rhythm has replaced Atrial fibrillation Vent. rate has decreased BY 100 BPM ST no longer depressed in Anterior leads Nonspecific T wave abnormality no longer evident in Inferior leads T wave inversion less evident in Lateral leads Confirmed by Belva Crome [id number]) on 12/03/2022 2:03:49 PM     Recent Labs: 08/30/2022: B Natriuretic Peptide 770.7; Magnesium 2.7 08/31/2022: ALT 17 09/01/2022: BUN 28; Creatinine, Ser 1.53; Hemoglobin 9.7; Platelets 157; Potassium 3.6; Sodium 136  Recent Lipid Panel No results found for:  "CHOL", "TRIG", "HDL", "CHOLHDL", "VLDL", "LDLCALC", "LDLDIRECT"  Physical Exam:    VS:  BP 126/60   Pulse (!) 51   Ht 5' (1.524 m)   Wt 110 lb 9.6 oz (50.2 kg)   SpO2 90%   BMI 21.60 kg/m     Wt Readings from Last 3 Encounters:  12/03/22 110 lb 9.6 oz (50.2 kg)  08/31/22 110 lb 14.3 oz (50.3 kg)  06/29/22 104 lb 3.2 oz (47.3 kg)     GEN: Patient is in no acute distress HEENT: Normal NECK: No JVD; No carotid bruits LYMPHATICS: No lymphadenopathy CARDIAC: Hear sounds regular, 2/6 systolic murmur at the apex. RESPIRATORY:  Clear to auscultation without rales, wheezing or rhonchi  ABDOMEN: Soft, non-tender, non-distended MUSCULOSKELETAL:  No edema; No deformity  SKIN: Warm and dry NEUROLOGIC:  Alert and oriented x 3 PSYCHIATRIC:  Normal affect   Signed, Garwin Brothers, MD  12/03/2022 2:22 PM    [facility]

## 2022-12-03 NOTE — Telephone Encounter (Signed)
Last office note sent to Dr. Su Monks.

## 2022-12-03 NOTE — Patient Instructions (Addendum)
Please keep a BP log for 2 weeks and send by MyChart or mail.  Blood Pressure Record Sheet To take your blood pressure, you will need a blood pressure machine. You can buy a blood pressure machine (blood pressure monitor) at your clinic, drug store, or online. When choosing one, consider: An automatic monitor that has an arm cuff. A cuff that wraps snugly around your upper arm. You should be able to fit only one finger between your arm and the cuff. A device that stores blood pressure reading results. Do not choose a monitor that measures your blood pressure from your wrist or finger. Follow your health care provider's instructions for how to take your blood pressure. To use this form: Get one reading in the morning (a.m.) 1-2 hours after you take any medicines. Get one reading in the evening (p.m.) before supper.   Blood pressure log Date: _______________________  a.m. _____________________(1st reading) HR___________            p.m. _____________________(2nd reading) HR__________  Date: _______________________  a.m. _____________________(1st reading) HR___________            p.m. _____________________(2nd reading) HR__________  Date: _______________________  a.m. _____________________(1st reading) HR___________            p.m. _____________________(2nd reading) HR__________  Date: _______________________  a.m. _____________________(1st reading) HR___________            p.m. _____________________(2nd reading) HR__________  Date: _______________________  a.m. _____________________(1st reading) HR___________            p.m. _____________________(2nd reading) HR__________  Date: _______________________  a.m. _____________________(1st reading) HR___________            p.m. _____________________(2nd reading) HR__________  Date: _______________________  a.m. _____________________(1st reading) HR___________            p.m. _____________________(2nd reading)  HR__________   This information is not intended to replace advice given to you by your health care provider. Make sure you discuss any questions you have with your health care provider. Document Revised: 05/31/2019 Document Reviewed: 05/31/2019 Elsevier Patient Education  2021 Elsevier Inc.   Medication Instructions:  Your physician has recommended you make the following change in your medication:   Decrease the Amiodarone to 100 mg daily.  Stop Metoprolol  *If you need a refill on your cardiac medications before your next appointment, please call your pharmacy*   Lab Work: Your physician recommends that you have a TSH today in the office.  If you have labs (blood work) drawn today and your tests are completely normal, you will receive your results only by: MyChart Message (if you have MyChart) OR A paper copy in the mail If you have any lab test that is abnormal or we need to change your treatment, we will call you to review the results.   Testing/Procedures: None ordered   Follow-Up: At [facility], you and your health needs are our priority.  As part of our continuing mission to provide you with exceptional heart care, we have created designated Provider Care Teams.  These Care Teams include your primary Cardiologist (physician) and Advanced Practice Providers (APPs -  Physician Assistants and Nurse Practitioners) who all work together to provide you with the care you need, when you need it.  We recommend signing up for the patient portal called "MyChart".  Sign up information is provided on this After Visit Summary.  MyChart is used to connect with patients for Virtual Visits (Telemedicine).  Patients are able to view lab/test results, encounter  notes, upcoming appointments, etc.  Non-urgent messages can be sent to your provider as well.   To learn more about what you can do with MyChart, go to [email or web address].    Your next appointment:   3 month(s)  The  format for your next appointment:   In Person  Provider:   Belva Crome, MD    Other Instructions none  Important Information About Sugar

## 2022-12-03 NOTE — Addendum Note (Signed)
Addended by: Eleonore Chiquito on: 12/03/2022 02:40 PM   Modules accepted: Orders

## 2022-12-04 LAB — TSH: TSH: 9.65 u[IU]/mL — ABNORMAL HIGH (ref 0.450–4.500)

## 2022-12-06 ENCOUNTER — Inpatient Hospital Stay (HOSPITAL_COMMUNITY)
Admission: EM | Admit: 2022-12-06 | Discharge: 2022-12-12 | DRG: 219 | Disposition: A | Discharge status: Rehabilitation Facility | Payer: Medicare Other | Attending: Vascular Surgery | Admitting: Vascular Surgery

## 2022-12-06 DIAGNOSIS — G8220 Paraplegia, unspecified: Secondary | ICD-10-CM | POA: Diagnosis not present

## 2022-12-06 DIAGNOSIS — I71019 Dissection of thoracic aorta, unspecified: Secondary | ICD-10-CM | POA: Diagnosis present

## 2022-12-06 DIAGNOSIS — J9611 Chronic respiratory failure with hypoxia: Secondary | ICD-10-CM | POA: Diagnosis present

## 2022-12-06 DIAGNOSIS — Z87891 Personal history of nicotine dependence: Secondary | ICD-10-CM

## 2022-12-06 DIAGNOSIS — E039 Hypothyroidism, unspecified: Secondary | ICD-10-CM | POA: Diagnosis not present

## 2022-12-06 DIAGNOSIS — G9511 Acute infarction of spinal cord (embolic) (nonembolic): Secondary | ICD-10-CM | POA: Diagnosis not present

## 2022-12-06 DIAGNOSIS — D689 Coagulation defect, unspecified: Secondary | ICD-10-CM | POA: Diagnosis not present

## 2022-12-06 DIAGNOSIS — J449 Chronic obstructive pulmonary disease, unspecified: Secondary | ICD-10-CM | POA: Diagnosis present

## 2022-12-06 DIAGNOSIS — Z823 Family history of stroke: Secondary | ICD-10-CM

## 2022-12-06 DIAGNOSIS — D62 Acute posthemorrhagic anemia: Secondary | ICD-10-CM | POA: Diagnosis not present

## 2022-12-06 DIAGNOSIS — I71012 Dissection of descending thoracic aorta: Principal | ICD-10-CM | POA: Diagnosis present

## 2022-12-06 DIAGNOSIS — R739 Hyperglycemia, unspecified: Secondary | ICD-10-CM

## 2022-12-06 DIAGNOSIS — F419 Anxiety disorder, unspecified: Secondary | ICD-10-CM | POA: Diagnosis present

## 2022-12-06 DIAGNOSIS — Z9981 Dependence on supplemental oxygen: Secondary | ICD-10-CM

## 2022-12-06 DIAGNOSIS — I2510 Atherosclerotic heart disease of native coronary artery without angina pectoris: Secondary | ICD-10-CM | POA: Diagnosis present

## 2022-12-06 DIAGNOSIS — I70221 Atherosclerosis of native arteries of extremities with rest pain, right leg: Secondary | ICD-10-CM | POA: Diagnosis present

## 2022-12-06 DIAGNOSIS — I10 Essential (primary) hypertension: Secondary | ICD-10-CM | POA: Diagnosis present

## 2022-12-06 DIAGNOSIS — Z7901 Long term (current) use of anticoagulants: Secondary | ICD-10-CM

## 2022-12-06 DIAGNOSIS — N179 Acute kidney failure, unspecified: Secondary | ICD-10-CM | POA: Diagnosis present

## 2022-12-06 DIAGNOSIS — Z820 Family history of epilepsy and other diseases of the nervous system: Secondary | ICD-10-CM

## 2022-12-06 DIAGNOSIS — I480 Paroxysmal atrial fibrillation: Secondary | ICD-10-CM | POA: Diagnosis not present

## 2022-12-06 DIAGNOSIS — M810 Age-related osteoporosis without current pathological fracture: Secondary | ICD-10-CM | POA: Diagnosis not present

## 2022-12-06 DIAGNOSIS — I472 Ventricular tachycardia, unspecified: Secondary | ICD-10-CM | POA: Diagnosis present

## 2022-12-06 DIAGNOSIS — G9782 Other postprocedural complications and disorders of nervous system: Secondary | ICD-10-CM | POA: Diagnosis not present

## 2022-12-06 DIAGNOSIS — I7100 Dissection of unspecified site of aorta: Principal | ICD-10-CM

## 2022-12-06 DIAGNOSIS — Z79899 Other long term (current) drug therapy: Secondary | ICD-10-CM

## 2022-12-06 DIAGNOSIS — Y838 Other surgical procedures as the cause of abnormal reaction of the patient, or of later complication, without mention of misadventure at the time of the procedure: Secondary | ICD-10-CM | POA: Diagnosis not present

## 2022-12-06 DIAGNOSIS — R7989 Other specified abnormal findings of blood chemistry: Secondary | ICD-10-CM | POA: Diagnosis not present

## 2022-12-06 DIAGNOSIS — Z7982 Long term (current) use of aspirin: Secondary | ICD-10-CM | POA: Diagnosis not present

## 2022-12-06 DIAGNOSIS — Z8673 Personal history of transient ischemic attack (TIA), and cerebral infarction without residual deficits: Secondary | ICD-10-CM

## 2022-12-06 DIAGNOSIS — Z881 Allergy status to other antibiotic agents status: Secondary | ICD-10-CM | POA: Diagnosis not present

## 2022-12-06 DIAGNOSIS — R339 Retention of urine, unspecified: Secondary | ICD-10-CM | POA: Diagnosis not present

## 2022-12-06 DIAGNOSIS — Z9889 Other specified postprocedural states: Secondary | ICD-10-CM

## 2022-12-06 DIAGNOSIS — E782 Mixed hyperlipidemia: Secondary | ICD-10-CM | POA: Diagnosis not present

## 2022-12-06 DIAGNOSIS — K219 Gastro-esophageal reflux disease without esophagitis: Secondary | ICD-10-CM | POA: Diagnosis present

## 2022-12-06 MED ORDER — PROTHROMBIN COMPLEX CONC HUMAN 500 UNITS IV KIT
2615.0000 [IU] | PACK | Status: AC
  Administered 2022-12-07: 2615 [IU] via INTRAVENOUS
  Filled 2022-12-06: qty 2615

## 2022-12-07 ENCOUNTER — Emergency Department (HOSPITAL_COMMUNITY): Payer: Medicare Other

## 2022-12-07 ENCOUNTER — Other Ambulatory Visit: Payer: Self-pay

## 2022-12-07 ENCOUNTER — Emergency Department (HOSPITAL_COMMUNITY): Payer: Medicare Other | Admitting: Anesthesiology

## 2022-12-07 ENCOUNTER — Encounter (HOSPITAL_COMMUNITY)
Admission: EM | Disposition: A | Discharge status: Rehabilitation Facility | Payer: Self-pay | Source: Home / Self Care | Attending: Vascular Surgery

## 2022-12-07 DIAGNOSIS — K31811 Angiodysplasia of stomach and duodenum with bleeding: Secondary | ICD-10-CM | POA: Diagnosis not present

## 2022-12-07 DIAGNOSIS — G8222 Paraplegia, incomplete: Secondary | ICD-10-CM | POA: Diagnosis not present

## 2022-12-07 DIAGNOSIS — L89612 Pressure ulcer of right heel, stage 2: Secondary | ICD-10-CM | POA: Diagnosis not present

## 2022-12-07 DIAGNOSIS — E782 Mixed hyperlipidemia: Secondary | ICD-10-CM | POA: Diagnosis present

## 2022-12-07 DIAGNOSIS — I4891 Unspecified atrial fibrillation: Secondary | ICD-10-CM | POA: Diagnosis not present

## 2022-12-07 DIAGNOSIS — Z7982 Long term (current) use of aspirin: Secondary | ICD-10-CM | POA: Diagnosis not present

## 2022-12-07 DIAGNOSIS — Z95820 Peripheral vascular angioplasty status with implants and grafts: Secondary | ICD-10-CM | POA: Diagnosis not present

## 2022-12-07 DIAGNOSIS — Z820 Family history of epilepsy and other diseases of the nervous system: Secondary | ICD-10-CM | POA: Diagnosis not present

## 2022-12-07 DIAGNOSIS — R739 Hyperglycemia, unspecified: Secondary | ICD-10-CM

## 2022-12-07 DIAGNOSIS — I7100 Dissection of unspecified site of aorta: Secondary | ICD-10-CM | POA: Diagnosis not present

## 2022-12-07 DIAGNOSIS — I71019 Dissection of thoracic aorta, unspecified: Secondary | ICD-10-CM

## 2022-12-07 DIAGNOSIS — G9511 Acute infarction of spinal cord (embolic) (nonembolic): Secondary | ICD-10-CM | POA: Diagnosis not present

## 2022-12-07 DIAGNOSIS — K2289 Other specified disease of esophagus: Secondary | ICD-10-CM | POA: Diagnosis not present

## 2022-12-07 DIAGNOSIS — Y838 Other surgical procedures as the cause of abnormal reaction of the patient, or of later complication, without mention of misadventure at the time of the procedure: Secondary | ICD-10-CM | POA: Diagnosis not present

## 2022-12-07 DIAGNOSIS — G09 Sequelae of inflammatory diseases of central nervous system: Secondary | ICD-10-CM | POA: Diagnosis present

## 2022-12-07 DIAGNOSIS — I10 Essential (primary) hypertension: Secondary | ICD-10-CM | POA: Diagnosis present

## 2022-12-07 DIAGNOSIS — K219 Gastro-esophageal reflux disease without esophagitis: Secondary | ICD-10-CM | POA: Diagnosis present

## 2022-12-07 DIAGNOSIS — Z9889 Other specified postprocedural states: Secondary | ICD-10-CM

## 2022-12-07 DIAGNOSIS — D649 Anemia, unspecified: Secondary | ICD-10-CM | POA: Diagnosis not present

## 2022-12-07 DIAGNOSIS — J449 Chronic obstructive pulmonary disease, unspecified: Secondary | ICD-10-CM | POA: Diagnosis present

## 2022-12-07 DIAGNOSIS — L89156 Pressure-induced deep tissue damage of sacral region: Secondary | ICD-10-CM | POA: Diagnosis present

## 2022-12-07 DIAGNOSIS — M810 Age-related osteoporosis without current pathological fracture: Secondary | ICD-10-CM | POA: Diagnosis present

## 2022-12-07 DIAGNOSIS — K2211 Ulcer of esophagus with bleeding: Secondary | ICD-10-CM | POA: Diagnosis not present

## 2022-12-07 DIAGNOSIS — D62 Acute posthemorrhagic anemia: Secondary | ICD-10-CM | POA: Diagnosis not present

## 2022-12-07 DIAGNOSIS — R1900 Intra-abdominal and pelvic swelling, mass and lump, unspecified site: Secondary | ICD-10-CM | POA: Diagnosis not present

## 2022-12-07 DIAGNOSIS — I70221 Atherosclerosis of native arteries of extremities with rest pain, right leg: Secondary | ICD-10-CM | POA: Diagnosis present

## 2022-12-07 DIAGNOSIS — Z9981 Dependence on supplemental oxygen: Secondary | ICD-10-CM | POA: Diagnosis not present

## 2022-12-07 DIAGNOSIS — T18128A Food in esophagus causing other injury, initial encounter: Secondary | ICD-10-CM | POA: Diagnosis not present

## 2022-12-07 DIAGNOSIS — R195 Other fecal abnormalities: Secondary | ICD-10-CM | POA: Diagnosis not present

## 2022-12-07 DIAGNOSIS — E039 Hypothyroidism, unspecified: Secondary | ICD-10-CM | POA: Diagnosis present

## 2022-12-07 DIAGNOSIS — Q399 Congenital malformation of esophagus, unspecified: Secondary | ICD-10-CM | POA: Diagnosis not present

## 2022-12-07 DIAGNOSIS — N179 Acute kidney failure, unspecified: Secondary | ICD-10-CM | POA: Diagnosis present

## 2022-12-07 DIAGNOSIS — L89222 Pressure ulcer of left hip, stage 2: Secondary | ICD-10-CM | POA: Diagnosis not present

## 2022-12-07 DIAGNOSIS — I71012 Dissection of descending thoracic aorta: Secondary | ICD-10-CM | POA: Diagnosis present

## 2022-12-07 DIAGNOSIS — R0989 Other specified symptoms and signs involving the circulatory and respiratory systems: Secondary | ICD-10-CM | POA: Diagnosis not present

## 2022-12-07 DIAGNOSIS — N390 Urinary tract infection, site not specified: Secondary | ICD-10-CM | POA: Diagnosis not present

## 2022-12-07 DIAGNOSIS — D500 Iron deficiency anemia secondary to blood loss (chronic): Secondary | ICD-10-CM | POA: Diagnosis not present

## 2022-12-07 DIAGNOSIS — K5900 Constipation, unspecified: Secondary | ICD-10-CM | POA: Diagnosis not present

## 2022-12-07 DIAGNOSIS — G4700 Insomnia, unspecified: Secondary | ICD-10-CM | POA: Diagnosis not present

## 2022-12-07 DIAGNOSIS — R001 Bradycardia, unspecified: Secondary | ICD-10-CM | POA: Diagnosis not present

## 2022-12-07 DIAGNOSIS — I472 Ventricular tachycardia, unspecified: Secondary | ICD-10-CM | POA: Diagnosis present

## 2022-12-07 DIAGNOSIS — Z823 Family history of stroke: Secondary | ICD-10-CM | POA: Diagnosis not present

## 2022-12-07 DIAGNOSIS — K921 Melena: Secondary | ICD-10-CM | POA: Diagnosis not present

## 2022-12-07 DIAGNOSIS — K449 Diaphragmatic hernia without obstruction or gangrene: Secondary | ICD-10-CM | POA: Diagnosis not present

## 2022-12-07 DIAGNOSIS — Z87891 Personal history of nicotine dependence: Secondary | ICD-10-CM | POA: Diagnosis not present

## 2022-12-07 DIAGNOSIS — Z7901 Long term (current) use of anticoagulants: Secondary | ICD-10-CM | POA: Diagnosis not present

## 2022-12-07 DIAGNOSIS — J9611 Chronic respiratory failure with hypoxia: Secondary | ICD-10-CM | POA: Diagnosis present

## 2022-12-07 DIAGNOSIS — K592 Neurogenic bowel, not elsewhere classified: Secondary | ICD-10-CM | POA: Diagnosis not present

## 2022-12-07 DIAGNOSIS — R5383 Other fatigue: Secondary | ICD-10-CM | POA: Diagnosis not present

## 2022-12-07 DIAGNOSIS — Z881 Allergy status to other antibiotic agents status: Secondary | ICD-10-CM | POA: Diagnosis not present

## 2022-12-07 DIAGNOSIS — I2510 Atherosclerotic heart disease of native coronary artery without angina pectoris: Secondary | ICD-10-CM | POA: Diagnosis present

## 2022-12-07 DIAGNOSIS — N319 Neuromuscular dysfunction of bladder, unspecified: Secondary | ICD-10-CM | POA: Diagnosis not present

## 2022-12-07 DIAGNOSIS — F331 Major depressive disorder, recurrent, moderate: Secondary | ICD-10-CM | POA: Diagnosis not present

## 2022-12-07 DIAGNOSIS — G8220 Paraplegia, unspecified: Secondary | ICD-10-CM | POA: Diagnosis not present

## 2022-12-07 DIAGNOSIS — K259 Gastric ulcer, unspecified as acute or chronic, without hemorrhage or perforation: Secondary | ICD-10-CM | POA: Diagnosis not present

## 2022-12-07 DIAGNOSIS — D689 Coagulation defect, unspecified: Secondary | ICD-10-CM | POA: Diagnosis present

## 2022-12-07 DIAGNOSIS — L89622 Pressure ulcer of left heel, stage 2: Secondary | ICD-10-CM | POA: Diagnosis present

## 2022-12-07 DIAGNOSIS — I480 Paroxysmal atrial fibrillation: Secondary | ICD-10-CM | POA: Diagnosis present

## 2022-12-07 DIAGNOSIS — G9782 Other postprocedural complications and disorders of nervous system: Secondary | ICD-10-CM | POA: Diagnosis not present

## 2022-12-07 HISTORY — PX: THORACIC AORTIC ENDOVASCULAR STENT GRAFT: SHX6112

## 2022-12-07 HISTORY — PX: ENDARTERECTOMY FEMORAL: SHX5804

## 2022-12-07 HISTORY — PX: PATCH ANGIOPLASTY: SHX6230

## 2022-12-07 HISTORY — DX: Other specified postprocedural states: Z98.890

## 2022-12-07 HISTORY — DX: Dissection of thoracic aorta, unspecified: I71.019

## 2022-12-07 HISTORY — PX: ULTRASOUND GUIDANCE FOR VASCULAR ACCESS: SHX6516

## 2022-12-07 LAB — CBC WITH DIFFERENTIAL/PLATELET
Abs Immature Granulocytes: 0.03 10*3/uL (ref 0.00–0.07)
Basophils Absolute: 0.1 10*3/uL (ref 0.0–0.1)
Basophils Relative: 1 %
Eosinophils Absolute: 0.1 10*3/uL (ref 0.0–0.5)
Eosinophils Relative: 1 %
HCT: 27.1 % — ABNORMAL LOW (ref 36.0–46.0)
Hemoglobin: 8.5 g/dL — ABNORMAL LOW (ref 12.0–15.0)
Immature Granulocytes: 0 %
Lymphocytes Relative: 4 %
Lymphs Abs: 0.5 10*3/uL — ABNORMAL LOW (ref 0.7–4.0)
MCH: 28.3 pg (ref 26.0–34.0)
MCHC: 31.4 g/dL (ref 30.0–36.0)
MCV: 90.3 fL (ref 80.0–100.0)
Monocytes Absolute: 1.2 10*3/uL — ABNORMAL HIGH (ref 0.1–1.0)
Monocytes Relative: 11 %
Neutro Abs: 8.8 10*3/uL — ABNORMAL HIGH (ref 1.7–7.7)
Neutrophils Relative %: 83 %
Platelets: 216 10*3/uL (ref 150–400)
RBC: 3 MIL/uL — ABNORMAL LOW (ref 3.87–5.11)
RDW: 14.6 % (ref 11.5–15.5)
WBC: 10.6 10*3/uL — ABNORMAL HIGH (ref 4.0–10.5)
nRBC: 0 % (ref 0.0–0.2)

## 2022-12-07 LAB — BASIC METABOLIC PANEL
Anion gap: 9 (ref 5–15)
BUN: 25 mg/dL — ABNORMAL HIGH (ref 8–23)
CO2: 24 mmol/L (ref 22–32)
Calcium: 7.7 mg/dL — ABNORMAL LOW (ref 8.9–10.3)
Chloride: 102 mmol/L (ref 98–111)
Creatinine, Ser: 1.94 mg/dL — ABNORMAL HIGH (ref 0.44–1.00)
GFR, Estimated: 25 mL/min — ABNORMAL LOW (ref >60)
Glucose, Bld: 151 mg/dL — ABNORMAL HIGH (ref 70–99)
Potassium: 3.9 mmol/L (ref 3.5–5.1)
Sodium: 135 mmol/L (ref 135–145)

## 2022-12-07 LAB — COMPREHENSIVE METABOLIC PANEL
ALT: 17 U/L (ref 0–44)
AST: 25 U/L (ref 15–41)
Albumin: 3.1 g/dL — ABNORMAL LOW (ref 3.5–5.0)
Alkaline Phosphatase: 86 U/L (ref 38–126)
Anion gap: 14 (ref 5–15)
BUN: 28 mg/dL — ABNORMAL HIGH (ref 8–23)
CO2: 23 mmol/L (ref 22–32)
Calcium: 9.1 mg/dL (ref 8.9–10.3)
Chloride: 98 mmol/L (ref 98–111)
Creatinine, Ser: 2.48 mg/dL — ABNORMAL HIGH (ref 0.44–1.00)
GFR, Estimated: 19 mL/min — ABNORMAL LOW (ref >60)
Glucose, Bld: 119 mg/dL — ABNORMAL HIGH (ref 70–99)
Potassium: 4.3 mmol/L (ref 3.5–5.1)
Sodium: 135 mmol/L (ref 135–145)
Total Bilirubin: 1 mg/dL (ref 0.3–1.2)
Total Protein: 5.9 g/dL — ABNORMAL LOW (ref 6.5–8.1)

## 2022-12-07 LAB — CBC
HCT: 28.8 % — ABNORMAL LOW (ref 36.0–46.0)
Hemoglobin: 8.8 g/dL — ABNORMAL LOW (ref 12.0–15.0)
MCH: 26 pg (ref 26.0–34.0)
MCHC: 30.6 g/dL (ref 30.0–36.0)
MCV: 85.2 fL (ref 80.0–100.0)
Platelets: 166 10*3/uL (ref 150–400)
RBC: 3.38 MIL/uL — ABNORMAL LOW (ref 3.87–5.11)
RDW: 17.3 % — ABNORMAL HIGH (ref 11.5–15.5)
WBC: 13.7 10*3/uL — ABNORMAL HIGH (ref 4.0–10.5)
nRBC: 0 % (ref 0.0–0.2)

## 2022-12-07 LAB — GLUCOSE, CAPILLARY
Glucose-Capillary: 173 mg/dL — ABNORMAL HIGH (ref 70–99)
Glucose-Capillary: 159 mg/dL — ABNORMAL HIGH (ref 70–99)
Glucose-Capillary: 109 mg/dL — ABNORMAL HIGH (ref 70–99)
Glucose-Capillary: 104 mg/dL — ABNORMAL HIGH (ref 70–99)

## 2022-12-07 LAB — MRSA NEXT GEN BY PCR, NASAL: MRSA by PCR Next Gen: NOT DETECTED

## 2022-12-07 LAB — APTT: aPTT: 34 s (ref 24–36)

## 2022-12-07 LAB — HEMOGLOBIN A1C
Hgb A1c MFr Bld: 5.2 % (ref 4.8–5.6)
Mean Plasma Glucose: 102.54 mg/dL

## 2022-12-07 LAB — POCT ACTIVATED CLOTTING TIME
Activated Clotting Time: 201 s
Activated Clotting Time: 269 s

## 2022-12-07 LAB — PROTIME-INR
INR: 1.2 (ref 0.8–1.2)
INR: 1.4 — ABNORMAL HIGH (ref 0.8–1.2)
Prothrombin Time: 15 s (ref 11.4–15.2)
Prothrombin Time: 17.8 s — ABNORMAL HIGH (ref 11.4–15.2)

## 2022-12-07 LAB — PREPARE RBC (CROSSMATCH)

## 2022-12-07 LAB — TROPONIN I (HIGH SENSITIVITY): Troponin I (High Sensitivity): 40 ng/L — ABNORMAL HIGH (ref <18)

## 2022-12-07 LAB — MAGNESIUM: Magnesium: 2 mg/dL (ref 1.7–2.4)

## 2022-12-07 LAB — ABO/RH: ABO/RH(D): O POS

## 2022-12-07 SURGERY — INSERTION, ENDOVASCULAR STENT GRAFT, AORTA, THORACIC
Anesthesia: General | Site: Groin | Laterality: Right

## 2022-12-07 MED ORDER — ASPIRIN 81 MG PO TBEC
81.0000 mg | DELAYED_RELEASE_TABLET | Freq: Every day | ORAL | Status: DC
  Administered 2022-12-07 – 2022-12-12 (×6): 81 mg via ORAL
  Filled 2022-12-07 (×6): qty 1

## 2022-12-07 MED ORDER — DOCUSATE SODIUM 100 MG PO CAPS
100.0000 mg | ORAL_CAPSULE | Freq: Every day | ORAL | Status: DC
  Filled 2022-12-07: qty 1

## 2022-12-07 MED ORDER — HEPARIN 6000 UNIT IRRIGATION SOLUTION
Status: AC
  Filled 2022-12-07: qty 500

## 2022-12-07 MED ORDER — ACETAMINOPHEN 10 MG/ML IV SOLN: 1000.0000 mg | Freq: Once | INTRAVENOUS | Status: DC | PRN

## 2022-12-07 MED ORDER — SENNOSIDES-DOCUSATE SODIUM 8.6-50 MG PO TABS: 1.0000 | ORAL_TABLET | Freq: Every evening | ORAL | Status: DC | PRN

## 2022-12-07 MED ORDER — HEPARIN SODIUM (PORCINE) 5000 UNIT/ML IJ SOLN
5000.0000 [IU] | Freq: Three times a day (TID) | INTRAMUSCULAR | Status: DC
  Administered 2022-12-08 – 2022-12-12 (×12): 5000 [IU] via SUBCUTANEOUS
  Filled 2022-12-07 (×12): qty 1

## 2022-12-07 MED ORDER — BISACODYL 5 MG PO TBEC: 5.0000 mg | DELAYED_RELEASE_TABLET | Freq: Every day | ORAL | Status: DC | PRN

## 2022-12-07 MED ORDER — ONDANSETRON HCL 4 MG/2ML IJ SOLN
INTRAMUSCULAR | Status: AC
  Filled 2022-12-07: qty 2

## 2022-12-07 MED ORDER — CLEVIDIPINE BUTYRATE 0.5 MG/ML IV EMUL
INTRAVENOUS | Status: AC
  Filled 2022-12-07: qty 50

## 2022-12-07 MED ORDER — IODIXANOL 320 MG/ML IV SOLN
INTRAVENOUS | Status: DC | PRN
  Administered 2022-12-07: 89.2 mL

## 2022-12-07 MED ORDER — LABETALOL HCL 5 MG/ML IV SOLN
10.0000 mg | INTRAVENOUS | Status: AC | PRN
  Administered 2022-12-07 – 2022-12-08 (×4): 10 mg via INTRAVENOUS
  Filled 2022-12-07 (×4): qty 4

## 2022-12-07 MED ORDER — POTASSIUM CHLORIDE CRYS ER 20 MEQ PO TBCR: 20.0000 meq | EXTENDED_RELEASE_TABLET | Freq: Every day | ORAL | Status: DC | PRN

## 2022-12-07 MED ORDER — ROCURONIUM BROMIDE 10 MG/ML (PF) SYRINGE
PREFILLED_SYRINGE | INTRAVENOUS | Status: DC | PRN
  Administered 2022-12-07: 50 mg via INTRAVENOUS

## 2022-12-07 MED ORDER — PROPOFOL 10 MG/ML IV BOLUS
INTRAVENOUS | Status: AC
  Filled 2022-12-07: qty 20

## 2022-12-07 MED ORDER — LABETALOL HCL 5 MG/ML IV SOLN
10.0000 mg | Freq: Once | INTRAVENOUS | Status: AC
  Administered 2022-12-07: 10 mg via INTRAVENOUS
  Filled 2022-12-07: qty 4

## 2022-12-07 MED ORDER — ARFORMOTEROL TARTRATE 15 MCG/2ML IN NEBU
15.0000 ug | INHALATION_SOLUTION | Freq: Two times a day (BID) | RESPIRATORY_TRACT | Status: DC
  Administered 2022-12-07 – 2022-12-11 (×10): 15 ug via RESPIRATORY_TRACT
  Filled 2022-12-07 (×9): qty 2

## 2022-12-07 MED ORDER — HEPARIN 6000 UNIT IRRIGATION SOLUTION
Status: DC | PRN
  Administered 2022-12-07 (×2): 1

## 2022-12-07 MED ORDER — MAGNESIUM SULFATE 2 GM/50ML IV SOLN: 2.0000 g | Freq: Every day | INTRAVENOUS | Status: DC | PRN

## 2022-12-07 MED ORDER — PHENYLEPHRINE HCL-NACL 20-0.9 MG/250ML-% IV SOLN
INTRAVENOUS | Status: DC | PRN
  Administered 2022-12-07: 25 ug/min via INTRAVENOUS

## 2022-12-07 MED ORDER — CLEVIDIPINE BUTYRATE 0.5 MG/ML IV EMUL
INTRAVENOUS | Status: DC | PRN
Start: 2022-12-07 — End: 2022-12-07
  Administered 2022-12-07: 10 mg/h via INTRAVENOUS

## 2022-12-07 MED ORDER — METOPROLOL TARTRATE 5 MG/5ML IV SOLN: 2.0000 mg | INTRAVENOUS | Status: DC | PRN

## 2022-12-07 MED ORDER — SODIUM CHLORIDE 0.9 % IV SOLN: 500.0000 mL | Freq: Once | INTRAVENOUS | Status: DC | PRN

## 2022-12-07 MED ORDER — PHENOL 1.4 % MT LIQD: 1.0000 | OROMUCOSAL | Status: DC | PRN

## 2022-12-07 MED ORDER — FENTANYL CITRATE (PF) 100 MCG/2ML IJ SOLN: 25.0000 ug | INTRAMUSCULAR | Status: DC | PRN

## 2022-12-07 MED ORDER — CEFAZOLIN SODIUM-DEXTROSE 2-3 GM-%(50ML) IV SOLR
INTRAVENOUS | Status: DC | PRN
Start: 2022-12-07 — End: 2022-12-07
  Administered 2022-12-07: 2 g via INTRAVENOUS

## 2022-12-07 MED ORDER — SUGAMMADEX SODIUM 200 MG/2ML IV SOLN
INTRAVENOUS | Status: DC | PRN
  Administered 2022-12-07: 100.4 mg via INTRAVENOUS

## 2022-12-07 MED ORDER — PAROXETINE HCL 20 MG PO TABS
20.0000 mg | ORAL_TABLET | Freq: Every day | ORAL | Status: DC
  Administered 2022-12-07 – 2022-12-12 (×6): 20 mg via ORAL
  Filled 2022-12-07 (×6): qty 1

## 2022-12-07 MED ORDER — DEXAMETHASONE SODIUM PHOSPHATE 10 MG/ML IJ SOLN
INTRAMUSCULAR | Status: DC | PRN
  Administered 2022-12-07: 4 mg via INTRAVENOUS

## 2022-12-07 MED ORDER — ACETAMINOPHEN 325 MG PO TABS
325.0000 mg | ORAL_TABLET | ORAL | Status: DC | PRN
  Administered 2022-12-10 – 2022-12-11 (×3): 650 mg via ORAL
  Filled 2022-12-07 (×4): qty 2

## 2022-12-07 MED ORDER — INSULIN ASPART 100 UNIT/ML IJ SOLN
0.0000 [IU] | Freq: Three times a day (TID) | INTRAMUSCULAR | Status: DC
  Administered 2022-12-10: 2 [IU] via SUBCUTANEOUS

## 2022-12-07 MED ORDER — PANTOPRAZOLE SODIUM 40 MG PO TBEC
40.0000 mg | DELAYED_RELEASE_TABLET | Freq: Every day | ORAL | Status: DC
  Administered 2022-12-07 – 2022-12-12 (×6): 40 mg via ORAL
  Filled 2022-12-07 (×6): qty 1

## 2022-12-07 MED ORDER — VANCOMYCIN HCL IN DEXTROSE 1-5 GM/200ML-% IV SOLN
1000.0000 mg | Freq: Two times a day (BID) | INTRAVENOUS | Status: AC
  Administered 2022-12-07 (×2): 1000 mg via INTRAVENOUS
  Filled 2022-12-07 (×2): qty 200

## 2022-12-07 MED ORDER — LIDOCAINE 2% (20 MG/ML) 5 ML SYRINGE
INTRAMUSCULAR | Status: DC | PRN
  Administered 2022-12-07: 40 mg via INTRAVENOUS

## 2022-12-07 MED ORDER — LIDOCAINE 2% (20 MG/ML) 5 ML SYRINGE
INTRAMUSCULAR | Status: AC
  Filled 2022-12-07: qty 5

## 2022-12-07 MED ORDER — PROPOFOL 10 MG/ML IV BOLUS
INTRAVENOUS | Status: DC | PRN
  Administered 2022-12-07: 50 mg via INTRAVENOUS
  Administered 2022-12-07: 20 mg via INTRAVENOUS
  Administered 2022-12-07: 70 mg via INTRAVENOUS

## 2022-12-07 MED ORDER — NOREPINEPHRINE 4 MG/250ML-% IV SOLN
0.0000 ug/min | INTRAVENOUS | Status: DC
  Administered 2022-12-07: 2.5 ug/min via INTRAVENOUS
  Filled 2022-12-07: qty 250

## 2022-12-07 MED ORDER — EPHEDRINE 5 MG/ML INJ
INTRAVENOUS | Status: AC
  Filled 2022-12-07: qty 5

## 2022-12-07 MED ORDER — FENTANYL CITRATE (PF) 250 MCG/5ML IJ SOLN
INTRAMUSCULAR | Status: DC | PRN
  Administered 2022-12-07: 100 ug via INTRAVENOUS
  Administered 2022-12-07: 50 ug via INTRAVENOUS

## 2022-12-07 MED ORDER — ESMOLOL HCL-SODIUM CHLORIDE 2000 MG/100ML IV SOLN
INTRAVENOUS | Status: DC | PRN
Start: 2022-12-07 — End: 2022-12-07
  Administered 2022-12-07: 80 ug/kg/min via INTRAVENOUS

## 2022-12-07 MED ORDER — PHENYLEPHRINE 80 MCG/ML (10ML) SYRINGE FOR IV PUSH (FOR BLOOD PRESSURE SUPPORT)
PREFILLED_SYRINGE | INTRAVENOUS | Status: DC | PRN
  Administered 2022-12-07: 240 ug via INTRAVENOUS
  Administered 2022-12-07: 160 ug via INTRAVENOUS
  Administered 2022-12-07: 80 ug via INTRAVENOUS
  Administered 2022-12-07 (×7): 160 ug via INTRAVENOUS

## 2022-12-07 MED ORDER — PRAVASTATIN SODIUM 40 MG PO TABS
40.0000 mg | ORAL_TABLET | Freq: Every day | ORAL | Status: DC
  Administered 2022-12-07 – 2022-12-12 (×6): 40 mg via ORAL
  Filled 2022-12-07 (×6): qty 1

## 2022-12-07 MED ORDER — LACTATED RINGERS IV SOLN
INTRAVENOUS | Status: DC | PRN
Start: 2022-12-07 — End: 2022-12-07

## 2022-12-07 MED ORDER — GUAIFENESIN-DM 100-10 MG/5ML PO SYRP: 15.0000 mL | ORAL_SOLUTION | ORAL | Status: DC | PRN

## 2022-12-07 MED ORDER — SODIUM CHLORIDE 0.9 % IV SOLN: INTRAVENOUS | Status: AC

## 2022-12-07 MED ORDER — SUCCINYLCHOLINE CHLORIDE 200 MG/10ML IV SOSY
PREFILLED_SYRINGE | INTRAVENOUS | Status: DC | PRN
  Administered 2022-12-07: 100 mg via INTRAVENOUS

## 2022-12-07 MED ORDER — ROCURONIUM BROMIDE 100 MG/10ML IV SOLN
INTRAVENOUS | Status: DC | PRN
Start: 2022-12-07 — End: 2022-12-07

## 2022-12-07 MED ORDER — ROCURONIUM BROMIDE 10 MG/ML (PF) SYRINGE
PREFILLED_SYRINGE | INTRAVENOUS | Status: AC
  Filled 2022-12-07: qty 10

## 2022-12-07 MED ORDER — PROTHROMBIN COMPLEX CONC HUMAN 500 UNITS IV KIT: 50.0000 [IU]/kg | PACK | Status: DC

## 2022-12-07 MED ORDER — 0.9 % SODIUM CHLORIDE (POUR BTL) OPTIME
TOPICAL | Status: DC | PRN
Start: 2022-12-07 — End: 2022-12-07
  Administered 2022-12-07 (×2): 1000 mL

## 2022-12-07 MED ORDER — REVEFENACIN 175 MCG/3ML IN SOLN
175.0000 ug | Freq: Every day | RESPIRATORY_TRACT | Status: DC
  Administered 2022-12-07 – 2022-12-11 (×5): 175 ug via RESPIRATORY_TRACT
  Filled 2022-12-07 (×6): qty 3

## 2022-12-07 MED ORDER — AMIODARONE HCL 200 MG PO TABS
100.0000 mg | ORAL_TABLET | Freq: Every day | ORAL | Status: DC
  Administered 2022-12-07 – 2022-12-12 (×6): 100 mg via ORAL
  Filled 2022-12-07 (×6): qty 1

## 2022-12-07 MED ORDER — ONDANSETRON HCL 4 MG/2ML IJ SOLN
INTRAMUSCULAR | Status: DC | PRN
  Administered 2022-12-07: 4 mg via INTRAVENOUS

## 2022-12-07 MED ORDER — HYDRALAZINE HCL 20 MG/ML IJ SOLN
5.0000 mg | INTRAMUSCULAR | Status: DC | PRN
  Administered 2022-12-10: 5 mg via INTRAVENOUS
  Filled 2022-12-07: qty 1

## 2022-12-07 MED ORDER — OXYCODONE HCL 5 MG PO TABS: 5.0000 mg | ORAL_TABLET | ORAL | Status: DC | PRN

## 2022-12-07 MED ORDER — DEXAMETHASONE SODIUM PHOSPHATE 10 MG/ML IJ SOLN
INTRAMUSCULAR | Status: AC
  Filled 2022-12-07: qty 1

## 2022-12-07 MED ORDER — CHLORHEXIDINE GLUCONATE CLOTH 2 % EX PADS
6.0000 | MEDICATED_PAD | Freq: Every day | CUTANEOUS | Status: DC
  Administered 2022-12-07 – 2022-12-12 (×5): 6 via TOPICAL

## 2022-12-07 MED ORDER — PHENYLEPHRINE 80 MCG/ML (10ML) SYRINGE FOR IV PUSH (FOR BLOOD PRESSURE SUPPORT)
PREFILLED_SYRINGE | INTRAVENOUS | Status: AC
  Filled 2022-12-07: qty 10

## 2022-12-07 MED ORDER — FENTANYL CITRATE (PF) 250 MCG/5ML IJ SOLN
INTRAMUSCULAR | Status: AC
  Filled 2022-12-07: qty 5

## 2022-12-07 MED ORDER — HYDROMORPHONE HCL 1 MG/ML IJ SOLN: 0.5000 mg | INTRAMUSCULAR | Status: DC | PRN

## 2022-12-07 MED ORDER — ALUM & MAG HYDROXIDE-SIMETH 200-200-20 MG/5ML PO SUSP: 15.0000 mL | ORAL | Status: DC | PRN

## 2022-12-07 MED ORDER — ESMOLOL HCL-SODIUM CHLORIDE 2000 MG/100ML IV SOLN
25.0000 ug/kg/min | INTRAVENOUS | Status: DC
  Filled 2022-12-07: qty 100

## 2022-12-07 MED ORDER — SODIUM CHLORIDE 0.9 % IV SOLN
INTRAVENOUS | Status: DC | PRN
Start: 2022-12-07 — End: 2022-12-07

## 2022-12-07 MED ORDER — ACETAMINOPHEN 650 MG RE SUPP: 325.0000 mg | RECTAL | Status: DC | PRN

## 2022-12-07 MED ORDER — ESMOLOL HCL 100 MG/10ML IV SOLN
INTRAVENOUS | Status: AC
  Filled 2022-12-07: qty 10

## 2022-12-07 MED ORDER — HEPARIN SODIUM (PORCINE) 1000 UNIT/ML IJ SOLN
INTRAMUSCULAR | Status: DC | PRN
Start: 2022-12-07 — End: 2022-12-07
  Administered 2022-12-07: 5000 [IU] via INTRAVENOUS
  Administered 2022-12-07: 2000 [IU] via INTRAVENOUS
  Administered 2022-12-07 (×2): 3000 [IU] via INTRAVENOUS

## 2022-12-07 MED ORDER — VASOPRESSIN 20 UNIT/ML IV SOLN
INTRAVENOUS | Status: AC
  Filled 2022-12-07: qty 1

## 2022-12-07 MED ORDER — PROTAMINE SULFATE 10 MG/ML IV SOLN
INTRAVENOUS | Status: DC | PRN
Start: 2022-12-07 — End: 2022-12-07
  Administered 2022-12-07: 50 mg via INTRAVENOUS

## 2022-12-07 MED ORDER — VASOPRESSIN 20 UNIT/ML IV SOLN
INTRAVENOUS | Status: DC | PRN
Start: 2022-12-07 — End: 2022-12-07
  Administered 2022-12-07: 2 [IU] via INTRAVENOUS

## 2022-12-07 MED ORDER — EPHEDRINE SULFATE-NACL 50-0.9 MG/10ML-% IV SOSY
PREFILLED_SYRINGE | INTRAVENOUS | Status: DC | PRN
Start: 2022-12-07 — End: 2022-12-07
  Administered 2022-12-07: 10 mg via INTRAVENOUS

## 2022-12-07 SURGICAL SUPPLY — 68 items
ADH SKN CLS APL DERMABOND .7 (GAUZE/BANDAGES/DRESSINGS) ×3
BAG COUNTER SPONGE SURGICOUNT (BAG) ×3 IMPLANT
BAG SPNG CNTER NS LX DISP (BAG) ×3
CANISTER SUCT 3000ML PPV (MISCELLANEOUS) ×3 IMPLANT
CATH ACCU-VU SIZ PIG 5F 100CM (CATHETERS) IMPLANT
CATH ANGIO 5F BER2 100CM (CATHETERS) IMPLANT
CATH EMB 4FR 80 (CATHETERS) IMPLANT
CATH VISIONS PV .035 IVUS (CATHETERS) IMPLANT
CLIP TI MEDIUM 6 (CLIP) ×3 IMPLANT
CLIP TI WIDE RED SMALL 6 (CLIP) ×3 IMPLANT
CLOSURE PERCLOSE PROSTYLE (VASCULAR PRODUCTS) IMPLANT
DERMABOND ADVANCED .7 DNX12 (GAUZE/BANDAGES/DRESSINGS) ×3 IMPLANT
DEVICE CLOSURE PERCLS PRGLD 6F (VASCULAR PRODUCTS) IMPLANT
DEVICE TORQUE KENDALL .025-038 (MISCELLANEOUS) IMPLANT
DRSG TEGADERM 2-3/8X2-3/4 SM (GAUZE/BANDAGES/DRESSINGS) ×3 IMPLANT
ELECT CAUTERY BLADE 6.4 (BLADE) ×3 IMPLANT
ELECT REM PT RETURN 9FT ADLT (ELECTROSURGICAL) ×6
ELECTRODE REM PT RTRN 9FT ADLT (ELECTROSURGICAL) ×6 IMPLANT
GLIDEWIRE ADV .035X260CM (WIRE) IMPLANT
GLOVE BIOGEL PI IND STRL 7.0 (GLOVE) ×3 IMPLANT
GOWN STRL REUS W/ TWL LRG LVL3 (GOWN DISPOSABLE) ×6 IMPLANT
GOWN STRL REUS W/ TWL XL LVL3 (GOWN DISPOSABLE) ×3 IMPLANT
GOWN STRL REUS W/TWL LRG LVL3 (GOWN DISPOSABLE) ×6
GOWN STRL REUS W/TWL XL LVL3 (GOWN DISPOSABLE) ×3
GRAFT BALLN CATH 65CM (BALLOONS) IMPLANT
HEMOSTAT SNOW SURGICEL 2X4 (HEMOSTASIS) IMPLANT
KIT BASIN OR (CUSTOM PROCEDURE TRAY) ×3 IMPLANT
KIT TURNOVER KIT B (KITS) ×3 IMPLANT
LOOP VESSEL MAXI 1X406 RED (MISCELLANEOUS) IMPLANT
LOOP VESSEL MAXI BLUE 1X16 (MISCELLANEOUS) IMPLANT
NDL PERC 18GX7CM (NEEDLE) ×3 IMPLANT
NEEDLE PERC 18GX7CM (NEEDLE) ×3 IMPLANT
NS IRRIG 1000ML POUR BTL (IV SOLUTION) ×3 IMPLANT
PACK ENDOVASCULAR (PACKS) ×3 IMPLANT
PAD ARMBOARD 7.5X6 YLW CONV (MISCELLANEOUS) ×6 IMPLANT
PATCH VASC XENOSURE 1X6 (Vascular Products) IMPLANT
PENCIL BUTTON HOLSTER BLD 10FT (ELECTRODE) ×3 IMPLANT
PENCIL SMOKE EVACUATOR (MISCELLANEOUS) IMPLANT
PERCLOSE PROGLIDE 6F (VASCULAR PRODUCTS)
POWDER SURGICEL 3.0 GRAM (HEMOSTASIS) IMPLANT
SET MICROPUNCTURE 5F STIFF (MISCELLANEOUS) IMPLANT
SHEATH AVANTI 11CM 8FR (SHEATH) IMPLANT
SHEATH DRYSEAL FLEX 20FR 33CM (SHEATH) IMPLANT
SHEATH PINNACLE 8F 10CM (SHEATH) IMPLANT
SHIELD RADPAD SCOOP 12X17 (MISCELLANEOUS) ×6 IMPLANT
SLEEVE ISOL F/PACE RF HD COVER (MISCELLANEOUS) IMPLANT
STENT GRFT THORAC ACS 31X31X20 (Endovascular Graft) IMPLANT
STOPCOCK MORSE 400PSI 3WAY (MISCELLANEOUS) ×3 IMPLANT
SUT ETHILON 3 0 PS 1 (SUTURE) IMPLANT
SUT MNCRL AB 4-0 PS2 18 (SUTURE) IMPLANT
SUT PROLENE 5 0 C 1 24 (SUTURE) IMPLANT
SUT PROLENE 6 0 BV (SUTURE) IMPLANT
SUT SILK 2 0 (SUTURE) ×3
SUT SILK 2-0 18XBRD TIE 12 (SUTURE) IMPLANT
SUT SILK 3 0 (SUTURE) ×3
SUT SILK 3-0 18XBRD TIE 12 (SUTURE) IMPLANT
SUT VIC AB 2-0 CT1 27 (SUTURE) ×3
SUT VIC AB 2-0 CT1 TAPERPNT 27 (SUTURE) IMPLANT
SUT VIC AB 3-0 SH 27 (SUTURE) ×3
SUT VIC AB 3-0 SH 27X BRD (SUTURE) IMPLANT
SYR 30ML LL (SYRINGE) IMPLANT
SYR 50ML LL SCALE MARK (SYRINGE) ×3 IMPLANT
TOWEL GREEN STERILE (TOWEL DISPOSABLE) ×3 IMPLANT
TRAY FOLEY MTR SLVR 16FR STAT (SET/KITS/TRAYS/PACK) ×3 IMPLANT
TUBING HIGH PRESSURE 120CM (CONNECTOR) ×3 IMPLANT
TUBING INJECTOR 48 (MISCELLANEOUS) IMPLANT
WIRE BENTSON .035X145CM (WIRE) IMPLANT
WIRE STIFF LUNDERQUIST 260CM (WIRE) IMPLANT

## 2022-12-07 NOTE — ED Provider Notes (Signed)
[place] EMERGENCY DEPARTMENT AT [facility] Provider Note   CSN: [id number] Arrival date & time: 12/06/22  2349     History  No chief complaint on file.   Heather Molina is a 82 y.o. female.  Patient transferred from outside facility with concern for leak of her descending aorta.  She presented to outside facility with midsternal chest pain worse with movement that radiates to her back and feels like a "tight bra".  Pain radiated from her bilateral ribs to her back was associated with some shortness of breath.  No cough or fever.  She had a CT angiogram performed at the outside facility which showed there was ulceration of her descending aorta with some leakage   CTA with acute IMH extending from the left subclavian artery down to the distal descending thoracic aorta.  There are multiple atherosclerotic lesions and tortuosity throughout her thoracic aorta.  There are multiple areas of contour changes in the lumen that appear to be areas of bleeding into the wall of the aorta.  Patient sent here to see vascular surgery.  She denies any chest pain, back pain or abdominal pain currently.  No trouble breathing.  She arrives on esmolol infusion.  She received Eliquis and Toradol at the outside hospital.  The history is provided by the patient.       Home Medications Prior to Admission medications   Medication Sig Start Date End Date Taking? Authorizing Provider  acetaminophen (TYLENOL) 500 MG tablet Take 500 mg by mouth as needed for moderate pain.    [provider]  amiodarone (PACERONE) 200 MG tablet Take 0.5 tablets (100 mg total) by mouth daily. 12/03/22   Revankar, Aundra Dubin, MD  aspirin 81 MG EC tablet Take 81 mg by mouth daily.    [provider]  BREZTRI AEROSPHERE 160-9-4.8 MCG/ACT AERO Inhale 2 puffs into the lungs in the morning and at bedtime. 06/19/22   [provider]  Cholecalciferol (VITAMIN D) 50 MCG (2000 UT) CAPS Take 2,000 Units by  mouth daily.    [provider]  diphenoxylate-atropine (LOMOTIL) 2.5-0.025 MG tablet Take 0.5 tablets by mouth as needed for diarrhea or loose stools.    [provider]  doxycycline (VIBRA-TABS) 100 MG tablet Take 1 tablet (100 mg total) by mouth every 12 (twelve) hours. Patient not taking: Reported on 12/03/2022 09/01/22   Azucena Fallen, MD  ELIQUIS 2.5 MG TABS tablet Take 2.5 mg by mouth 2 (two) times daily. 06/12/22   [provider]  furosemide (LASIX) 20 MG tablet Take 1 tablet by mouth daily. 11/23/22 12/23/22  [provider]  levalbuterol Pauline Aus) 1.25 MG/0.5ML nebulizer solution Take 1.25 mg by nebulization as needed for wheezing or shortness of breath.    [provider]  PARoxetine (PAXIL) 20 MG tablet Take 20 mg by mouth daily. 01/23/19   [provider]  pravastatin (PRAVACHOL) 40 MG tablet Take 40 mg by mouth daily. 05/19/22   [provider]  tobramycin (TOBREX) 0.3 % ophthalmic solution Place 1-4 drops into the left eye See admin instructions. Instil 1 drop into the left eye the day before injection. Instill 2 drops  into the left eye at breakfast, lunch, and bedtime the day of injection. Instill 4 drops  into the left eye the morning after injection. Patient not taking: Reported on 12/03/2022    [provider]      Allergies    Ceftriaxone and Albuterol    Review  of Systems   Review of Systems  Constitutional:  Negative for activity change, appetite change and fever.  HENT:  Negative for congestion.   Respiratory:  Positive for chest tightness.   Cardiovascular:  Positive for chest pain.  Gastrointestinal:  Positive for abdominal pain. Negative for nausea and vomiting.  Genitourinary:  Negative for dysuria and hematuria.  Musculoskeletal:  Positive for back pain. Negative for arthralgias, myalgias and neck pain.  Neurological:  Negative for dizziness, weakness and headaches.   all other systems  are negative except as noted in the HPI and PMH.    Physical Exam Updated Vital Signs BP 135/62   Pulse 74   Resp 15   SpO2 96%  Physical Exam Vitals and nursing note reviewed.  Constitutional:      General: She is not in acute distress.    Appearance: She is well-developed.  HENT:     Head: Normocephalic and atraumatic.     Mouth/Throat:     Pharynx: No oropharyngeal exudate.  Eyes:     Conjunctiva/sclera: Conjunctivae normal.     Pupils: Pupils are equal, round, and reactive to light.  Neck:     Comments: No meningismus. Cardiovascular:     Rate and Rhythm: Normal rate and regular rhythm.     Heart sounds: Normal heart sounds. No murmur heard. Pulmonary:     Effort: Pulmonary effort is normal. No respiratory distress.     Breath sounds: Normal breath sounds.  Chest:     Chest wall: No tenderness.  Abdominal:     Palpations: Abdomen is soft.     Tenderness: There is no abdominal tenderness. There is no guarding or rebound.  Musculoskeletal:        General: No tenderness. Normal range of motion.     Cervical back: Normal range of motion and neck supple.     Comments: Equal DP and PT pulses bilaterally.  Skin:    General: Skin is warm.  Neurological:     Mental Status: She is alert and oriented to person, place, and time.     Cranial Nerves: No cranial nerve deficit.     Motor: No abnormal muscle tone.     Coordination: Coordination normal.     Comments:  5/5 strength throughout. CN 2-12 intact.Equal grip strength.   Psychiatric:        Behavior: Behavior normal.     ED Results / Procedures / Treatments   Labs (all labs ordered are listed, but only abnormal results are displayed) Labs Reviewed  CBC WITH DIFFERENTIAL/PLATELET - Abnormal; Notable for the following components:      Result Value   WBC 10.6 (*)    RBC 3.00 (*)    Hemoglobin 8.5 (*)    HCT 27.1 (*)    Neutro Abs 8.8 (*)    Lymphs Abs 0.5 (*)    Monocytes Absolute 1.2 (*)    All other  components within normal limits  COMPREHENSIVE METABOLIC PANEL - Abnormal; Notable for the following components:   Glucose, Bld 119 (*)    BUN 28 (*)    Creatinine, Ser 2.48 (*)    Total Protein 5.9 (*)    Albumin 3.1 (*)    GFR, Estimated 19 (*)    All other components within normal limits  GLUCOSE, CAPILLARY - Abnormal; Notable for the following components:   Glucose-Capillary 173 (*)    All other components within normal limits  TROPONIN I (HIGH SENSITIVITY) - Abnormal; Notable for the following components:  Troponin I (High Sensitivity) 40 (*)    All other components within normal limits  MRSA NEXT GEN BY PCR, NASAL  PROTIME-INR  CBC  BASIC METABOLIC PANEL  PROTIME-INR  APTT  MAGNESIUM  TYPE AND SCREEN  ABO/RH  PREPARE RBC (CROSSMATCH)  TROPONIN I (HIGH SENSITIVITY)    EKG None  Radiology HYBRID OR IMAGING ([facility] ONLY)  Result Date: 12/07/2022 There is no interpretation for this exam.  This order is for images obtained during a surgical procedure.  Please See "Surgeries" Tab for more information regarding the procedure.    Procedures .Critical Care  Performed by: Glynn Octave, MD Authorized by: Glynn Octave, MD   Critical care provider statement:    Critical care time (minutes):  35   Critical care time was exclusive of:  Separately billable procedures and treating other patients   Critical care was necessary to treat or prevent imminent or life-threatening deterioration of the following conditions: Aortic hematoma.   Critical care was time spent personally by me on the following activities:  Development of treatment plan with patient or surrogate, discussions with consultants, evaluation of patient's response to treatment, examination of patient, ordering and review of laboratory studies, ordering and review of radiographic studies, ordering and performing treatments and interventions, pulse oximetry, re-evaluation of patient's condition, review of old charts,  blood draw for specimens and obtaining history from patient or surrogate   I assumed direction of critical care for this patient from another provider in my specialty: no     Care discussed with: admitting provider       Medications Ordered in ED Medications  prothrombin complex conc human (KCENTRA) IVPB 2,615 Units (0 Units Intravenous Stopped 12/07/22 0024)    ED Course/ Medical Decision Making/ A&P                                 Medical Decision Making Amount and/or Complexity of Data Reviewed Labs: ordered. ECG/medicine tests: ordered.  Risk Decision regarding hospitalization.   Patient transferred from outside hospital with intramural hematoma of aorta.  She is hemodynamically stable on esmolol drip.  Denies any chest pain currently.  Her eliquis will be emergently reversed with Kcentra. D/w Mitzi Davenport of pharmacy.  Repeat labs ordered. Blood pressure and mental status remained stable throughout ED course.  Seen at bedside with Dr. Hetty Blend of vascular surgery.  Risk and benefits of stent placement discussed with patient and family at bedside.  They elected to proceed with emergent OR for stent placement.  D/w Dr. Hetty Blend who states no role for cardiothoracic surgery involvement for open repair.  Labs returned with anemia of 8.5 and creatinine of 2.5.        Final Clinical Impression(s) / ED Diagnoses Final diagnoses:  Intramural aortic hematoma [facility])    Rx / DC Orders ED Discharge Orders     None         Anberlyn Feimster, Jeannett Senior, MD 12/07/22 [phone]

## 2022-12-07 NOTE — ED Notes (Signed)
Consent obtained with provider, RN, and pts family at bedside.

## 2022-12-07 NOTE — Anesthesia Procedure Notes (Signed)
Procedure Name: Intubation Date/Time: 12/07/2022 1:26 AM  Performed by: Randon Goldsmith, CRNAPre-anesthesia Checklist: Patient identified, Emergency Drugs available, Suction available and Patient being monitored Patient Re-evaluated:Patient Re-evaluated prior to induction Oxygen Delivery Method: Circle system utilized Preoxygenation: Pre-oxygenation with 100% oxygen Induction Type: IV induction, Rapid sequence and Cricoid Pressure applied Laryngoscope Size: Mac and 3 Grade View: Grade II Tube type: Oral Tube size: 7.0 mm Number of attempts: 1 Airway Equipment and Method: Stylet Placement Confirmation: ETT inserted through vocal cords under direct vision, positive ETCO2 and breath sounds checked- equal and bilateral Secured at: 20 cm Tube secured with: Tape Dental Injury: Teeth and Oropharynx as per pre-operative assessment

## 2022-12-07 NOTE — Anesthesia Postprocedure Evaluation (Signed)
Anesthesia Post Note  Patient: Heather Molina  Procedure(s) Performed: TEVAR ULTRASOUND GUIDANCE FOR VASCULAR ACCESS (Groin) RIGHT FEMORAL PATCH ANGIOPLASTY USING BOVINE PATCH (Right: Groin) RIGHT FEMORAL ENDARTERECTOMY (Right: Groin) INTRAVASCULAR ULTRASOUND (Groin)     Patient location during evaluation: PACU Anesthesia Type: General Level of consciousness: awake and alert Pain management: pain level controlled Vital Signs Assessment: post-procedure vital signs reviewed and stable Respiratory status: spontaneous breathing, nonlabored ventilation, respiratory function stable and patient connected to nasal cannula oxygen Cardiovascular status: blood pressure returned to baseline and stable Postop Assessment: no apparent nausea or vomiting Anesthetic complications: yes Comments: - patient with absent motor function to bilateral lower extremities not present pre-procedure. Sensation intact, upper extremity exam WNL bilaterally. Surgeon aware.    No notable events documented.  Last Vitals:  Vitals:   12/07/22 0630 12/07/22 0644  BP: (!) 177/54 (!) 161/65  Pulse: 72 71  Resp: 10 10  Temp:    SpO2: 100% 100%    Last Pain:  Vitals:   12/07/22 0600  TempSrc: Oral  PainSc: 0-No pain                 Earl Lites P Ellenie Salome

## 2022-12-07 NOTE — Consult Note (Signed)
NAME:  Heather Molina, MRN:  [id number], DOB:  Jul 31, 1940, LOS: 0 ADMISSION DATE:  12/06/2022, CONSULTATION DATE:  10/14 REFERRING MD:  Hetty Blend, CHIEF COMPLAINT:  post-TEVAR   History of Present Illness:  Heather Molina is an 82 y/o woman with a history of tobacco abuse who presented with sudden sharp CP, found to have an aortic aneurysm with pending aortic rupture. She was transferred from [facility] to [facility] for vascular surgery evaluation and management. She underwent emergent TEVAR overnight. This morning she cannot move her legs. She no longer has CP. She is on eliquis PTA for Afib, which was reversed with Kcentra pre-operatively. She has refused spinal drain per Vascular surgery this morning. PCCM consulted for ICU management.   Pertinent  Medical History  COPD, chronic respiratory failure on oxygen Paroxysmal Afib on DOAC Tobacco abuse GERD CVA in 2022 HTN Prolonged QT NSVT   Significant Hospital Events: Including procedures, antibiotic start and stop dates in addition to other pertinent events   10/13 presented with CP, emergent TEVAR  Interim History / Subjective:  This morning she cannot move her legs. She denies complaints.   Objective   Blood pressure (!) 198/56, pulse 72, temperature (!) 97.5 F (36.4 C), temperature source Oral, resp. rate 11, SpO2 100%.        Intake/Output Summary (Last 24 hours) at 12/07/2022 0758 Last data filed at 12/07/2022 0700 Gross per 24 hour  Intake 2104.07 ml  Output 330 ml  Net 1774.07 ml   There were no vitals filed for this visit.  Examination: General: chronically ill appearing woman lying in bed in NAD HENT: [place]/AT, eyes anicteric Lungs: breathing comfortably on [place], no wheezing Cardiovascular: S1S2, RRR Abdomen: soft, NT, hypoactive bowel sounds Extremities: no cyanosis or edema, lots of bruising and scabs on her legs, no edema. Palpable DP, OT pulses. No bleeding from femoral access sites. Neuro: awake, alert, answering questions  appropriately. Derm: no diffuse rashes  BUN 28 Cr 2.48 WBC 10.6 H/H 8.5/27.1 Platelets 216  EKG personally reviewed> NSR, lateral precordial leads with 1mm ST depression, borderline qtc.   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; s/p TEVAR, complicated by [place] infarction -appreciate vascular surgery's management -MAP goal >100 for [place] perfusion; patient refusing spinal drain -hold AC  Paroxysmal Afib, chronic -con't amiodarone -holding Eliquis -keep monitoring thyroid function; long term may not be a good amiodarone candidate -has been recommended for PFTs to monitor for amiodarone induced pulmonary toxicity  [place] infarction post-TEVAR -goal MAP >100 -patient declined spinal drain after discussion with vascular surgery  Chronic respiratory failure with hypoxia due to COPD -bronchodilators  AKI -maintaining adequate perfusion -strict I/O -renally dose meds, avoid nephrotoxic meds  Anemia -transfuse for Hb <7 or hemodynamically significant bleeding; getting 1 unit pRBC today -monitor -monitor for hematoma development-- none so far  Mild troponin elevation likely due to HTN and dissection -monitor on tele -recommend   Coagulopathy, mild -monitor; no need   HTN -not treating for now; keeping MAP higher for [place] perfusion  Hypothyroidism-- recent TSH 10 -hold off on treating for now; anticipate she will need supplementation -may need to stop amiodarone  Hyperglycemia -SSI PRN -goal BG 140-180  Best Practice (right click and "Reselect all SmartList Selections" daily)   Diet/type: clear liquids DVT prophylaxis: prophylactic heparin  GI prophylaxis: PPI Lines: N/A Foley:  Yes, and it is still needed Code  Status:  full code Last date of multidisciplinary goals of care discussion [patient updated at bedside]  Labs   CBC: Recent Labs  Lab 12/07/22 0040   WBC 10.6*  NEUTROABS 8.8*  HGB 8.5*  HCT 27.1*  MCV 90.3  PLT 216    Basic Metabolic Panel: Recent Labs  Lab 12/07/22 0040  NA 135  K 4.3  CL 98  CO2 23  GLUCOSE 119*  BUN 28*  CREATININE 2.48*  CALCIUM 9.1   GFR: Estimated Creatinine Clearance: 12.3 mL/min (A) (by C-G formula based on SCr of 2.48 mg/dL (H)). Recent Labs  Lab 12/07/22 0040  WBC 10.6*    Liver Function Tests: Recent Labs  Lab 12/07/22 0040  AST 25  ALT 17  ALKPHOS 86  BILITOT 1.0  PROT 5.9*  ALBUMIN 3.1*   No results for input(s): "LIPASE", "AMYLASE" in the last 168 hours. No results for input(s): "AMMONIA" in the last 168 hours.  ABG No results found for: "PHART", "PCO2ART", "PO2ART", "HCO3", "TCO2", "ACIDBASEDEF", "O2SAT"   Coagulation Profile: Recent Labs  Lab 12/07/22 0040  INR 1.2    Cardiac Enzymes: No results for input(s): "CKTOTAL", "CKMB", "CKMBINDEX", "TROPONINI" in the last 168 hours.  HbA1C: No results found for: "HGBA1C"  CBG: Recent Labs  Lab 12/07/22 0620  GLUCAP 173*    Review of Systems:   Review of Systems  Constitutional:  Negative for chills and fever.  Respiratory:  Negative for shortness of breath.   Cardiovascular:  Positive for chest pain. Negative for leg swelling.  Gastrointestinal:  Negative for vomiting.  Skin:  Negative for rash.     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) (12/26/2020), Cigarette  nicotine dependence without complication (12/26/2020), COPD with acute exacerbation (HCC) (08/30/2022), Elevated brain natriuretic peptide (BNP) level (08/30/2022), G E R D (05/20/2009), Generalized edema (08/08/2019), History of CVA (cerebrovascular accident) (12/26/2020), Hypertension, Hypoxia (05/13/2019), Irritable bowel syndrome with diarrhea (09/27/2019), Leukocytosis (05/13/2019), Lipoma of torso (04/07/2019), Mixed dyslipidemia (04/06/2018), NSVT (nonsustained ventricular tachycardia) (HCC) (05/13/2019), Paroxysmal atrial fibrillation (HCC) (06/29/2022), Prolonged Q-T interval on ECG (05/13/2019), Recurrent major depressive disorder, in full remission (HCC) (04/06/2018), Respiratory infection (04/15/2022), Right inguinal hernia (03/02/2018), Sebaceous cyst (04/07/2019), and TOBACCO ABUSE (05/21/2009).   Surgical History:   Past Surgical History:  Procedure Laterality Date   CHOLECYSTECTOMY     HERNIA REPAIR       Social History:   reports that she has quit smoking. She has never used smokeless tobacco. She reports that she does not currently use alcohol. She reports that she does not currently use drugs.   Family History:  Her family history includes CVA in her father; Epilepsy in her father.   Allergies Allergies  Allergen Reactions   Ceftriaxone Hives, Shortness Of Breath and Swelling   Albuterol Other (See Comments)    HEART PALPATIONS AND JITTERY     Home Medications  Prior to Admission medications   Medication Sig Start Date End Date Taking? Authorizing Provider  acetaminophen (TYLENOL) 500 MG tablet Take 500 mg by mouth as needed for moderate pain.    [provider]  amiodarone (PACERONE) 200 MG tablet Take 0.5 tablets (100 mg total) by mouth daily. 12/03/22   Revankar, Aundra Dubin, MD  aspirin 81 MG EC tablet Take 81 mg by mouth daily.    [provider]  BREZTRI AEROSPHERE 160-9-4.8 MCG/ACT AERO Inhale 2 puffs into the lungs in the morning and at bedtime.  06/19/22   [provider]  Cholecalciferol (VITAMIN D) 50 MCG (2000 UT) CAPS Take 2,000 Units by mouth daily.    [provider]  diphenoxylate-atropine (LOMOTIL) 2.5-0.025 MG tablet Take 0.5 tablets by mouth as needed for diarrhea or loose stools.    [provider]  doxycycline (VIBRA-TABS) 100 MG tablet Take 1 tablet (100 mg total) by mouth every 12 (twelve) hours. Patient not taking: Reported on 12/03/2022 09/01/22   Azucena Fallen, MD  ELIQUIS 2.5 MG TABS tablet Take 2.5 mg by mouth 2 (two) times daily. 06/12/22   [provider]  furosemide (LASIX) 20 MG tablet Take 1 tablet by mouth daily. 11/23/22 12/23/22  [provider]  levalbuterol Pauline Aus) 1.25 MG/0.5ML nebulizer solution Take 1.25 mg by nebulization as needed for wheezing or shortness of breath.    [provider]  PARoxetine (PAXIL) 20 MG tablet Take 20 mg by mouth daily. 01/23/19   [provider]  pravastatin (PRAVACHOL) 40 MG tablet Take 40 mg by mouth daily. 05/19/22   [provider]  tobramycin (TOBREX) 0.3 % ophthalmic solution Place 1-4 drops into the left eye See admin instructions. Instil 1 drop into the left eye the day before injection. Instill 2 drops  into the left eye at breakfast, lunch, and bedtime the day of injection. Instill 4 drops  into the left eye the morning after injection. Patient not taking: Reported on 12/03/2022    [provider]     Critical care time: 50 min.     Steffanie Dunn, DO 12/07/22 1:38 PM [facility]  For contact information, see Amion. If no response to pager, please call PCCM consult pager. After hours, 7PM- 7AM, please call Elink.

## 2022-12-07 NOTE — Progress Notes (Addendum)
Progress Note    12/07/2022 6:54 AM Day of Surgery  Subjective:  states she is cold; not able to move feet  Afebrile HR 70's-80's NSR 110's-160's systolic 100% 2LO2NC   Gtts: Levophed  Vitals:   12/07/22 0630 12/07/22 0644  BP: (!) 177/54 (!) 161/65  Pulse: 72 71  Resp: 10 10  Temp:    SpO2: 100% 100%    Physical Exam: General:  resting in no distress Cardiac:  regular Lungs:  non labored Incisions:  right groin incision is clean and dry  Extremities:  palpable DP pulses bilaterally; BLE motor is not in tact.  She is unable to lift legs or wiggle toes.  Abdomen:  soft, NT  CBC    Component Value Date/Time   WBC 10.6 (H) 12/07/2022 0040   RBC 3.00 (L) 12/07/2022 0040   HGB 8.5 (L) 12/07/2022 0040   HCT 27.1 (L) 12/07/2022 0040   PLT 216 12/07/2022 0040   MCV 90.3 12/07/2022 0040   MCH 28.3 12/07/2022 0040   MCHC 31.4 12/07/2022 0040   RDW 14.6 12/07/2022 0040   LYMPHSABS 0.5 (L) 12/07/2022 0040   MONOABS 1.2 (H) 12/07/2022 0040   EOSABS 0.1 12/07/2022 0040   BASOSABS 0.1 12/07/2022 0040    BMET    Component Value Date/Time   NA 135 12/07/2022 0040   K 4.3 12/07/2022 0040   CL 98 12/07/2022 0040   CO2 23 12/07/2022 0040   GLUCOSE 119 (H) 12/07/2022 0040   BUN 28 (H) 12/07/2022 0040   CREATININE 2.48 (H) 12/07/2022 0040   CALCIUM 9.1 12/07/2022 0040   GFRNONAA 19 (L) 12/07/2022 0040   GFRAA >60 05/14/2019 0414    INR    Component Value Date/Time   INR 1.2 12/07/2022 0040     Intake/Output Summary (Last 24 hours) at 12/07/2022 0654 Last data filed at 12/07/2022 0424 Gross per 24 hour  Intake 2020 ml  Output 280 ml  Net 1740 ml      Assessment/Plan:  82 y.o. female is s/p:  TEVAR with coverage of LSA Right femoral endarterectomy and patch angioplasty  Day of Surgery   -pt with palpable DP pulses bilaterally and right groin is clean and intact -pt motor not in tact BLE - most likely will require spinal drain.   -AKI-with  creatinine at midnight of 2.48-UOP 130cc.  Continue IVF -acute blood loss anemia - currently receiving PRBC's -DVT prophylaxis:  sq heparin -Dr. Hetty Blend to see pt and make further recommendations. -labs for am currently pending   Doreatha Massed, PA-C [facility] [phone] 12/07/2022 6:54 AM   VASCULAR STAFF ADDENDUM: I have independently interviewed and examined the patient. I agree with the above.  Status post emergent TEVAR with coverage of left subclavian artery for IMH with impending rupture.  Complicated by spinal cord ischemia.  I had an extensive discussion with the patient and the daughter about her current situation and high likelihood of long-term paralysis.  I explained that pressure elevation and lumbar drain placement is the only treatment to maximize recovery potential.  I also explained that given her current situation I do think recovery is unlikely even with lumbar drain placement.  The patient expressed that she was already having some leg weakness and trouble walking at home and does not want to undergo any further procedures or put herself at additional risk.  Plan to continue with blood pressure elevation with a MAP goal greater than 100 for 48 hours Continue with management in  the ICU  Daria Pastures MD [facility] Phone Number: [phone] 12/07/2022 9:46 AM

## 2022-12-07 NOTE — Transfer of Care (Addendum)
Immediate Anesthesia Transfer of Care Note  Patient: Heather Molina  Procedure(s) Performed: TEVAR ULTRASOUND GUIDANCE FOR VASCULAR ACCESS (Groin) RIGHT FEMORAL PATCH ANGIOPLASTY USING BOVINE PATCH (Right: Groin) RIGHT FEMORAL ENDARTERECTOMY (Right: Groin) INTRAVASCULAR ULTRASOUND (Groin)  Patient Location: PACU  Anesthesia Type:General  Level of Consciousness: awake and drowsy  Airway & Oxygen Therapy: Patient Spontanous Breathing and Patient connected to nasal cannula oxygen  Post-op Assessment: Report given to RN and Post -op Vital signs reviewed and stable Patient upper extremities strong and equal, no movement in lower extremities, able to feel sensation but no motor. Dr. Hetty Blend aware and at bedside.   Post vital signs: Reviewed and stable  Last Vitals:  Vitals Value Taken Time  BP 105/47 12/07/22 0446  Temp    Pulse 76 12/07/22 0453  Resp 13 12/07/22 0453  SpO2 100 % 12/07/22 0453  Vitals shown include unfiled device data.  Last Pain:  Vitals:   12/07/22 0004  PainSc: 0-No pain         Complications: No notable events documented.

## 2022-12-07 NOTE — Consult Note (Addendum)
Hospital Consult    Reason for Consult: Intramural hematoma Requesting Physician:   MRN #:  [id number]  History of Present Illness: This is a 82 y.o. female with a history of COPD on home oxygen, HTN, CAD, and A-fib on amiodarone and Eliquis.  I was called by [facility] due to CT with acute IMH and possible active bleed.  She was started on esmolol and transferred to [facility].  She presented to the emergency department today in [place] with acute onset back and chest pain.  She describes it as a sharp pain that progressed into a tightness feeling.  She had never felt pain like this before.  During this time she was also having some dyspnea.  She denies current chest or back pain.  She has trouble walking due to generalized weakness.  Past Medical History:  Diagnosis Date   Acute foot pain, right 04/15/2022   Acute on chronic respiratory failure with hypoxia (HCC) 08/30/2022   Age-related osteoporosis without current pathological fracture 04/06/2018   ALLERGIC RHINITIS 05/20/2009   Qualifier: Diagnosis of   By: Excell Seltzer CMA, Lawson Fiscal      Replacing diagnoses that were inactivated after the 05/25/22 regulatory import   Anxiety    Arthritis, multiple joint involvement 04/06/2018   C O P D 05/20/2009   Qualifier: Diagnosis of   By: Excell Seltzer CMA, Lori      Replacing diagnoses that were inactivated after the 05/25/22 regulatory import   CAD (coronary artery disease) 06/29/2022   Calculus of gallbladder with chronic cholecystitis without obstruction 04/30/2016   CAP (community acquired pneumonia) 05/13/2019   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/26/2020   Cigarette nicotine dependence without complication 12/26/2020   COPD with acute exacerbation (HCC) 08/30/2022   Elevated brain natriuretic peptide (BNP) level 08/30/2022   G E R D 05/20/2009   Qualifier: Diagnosis of   By: Excell Seltzer CMA, Lori       Generalized edema 08/08/2019   History of CVA (cerebrovascular  accident) 12/26/2020   Hypertension    Hypoxia 05/13/2019   Irritable bowel syndrome with diarrhea 09/27/2019   Leukocytosis 05/13/2019   Lipoma of torso 04/07/2019   Mixed dyslipidemia 04/06/2018   NSVT (nonsustained ventricular tachycardia) (HCC) 05/13/2019   Paroxysmal atrial fibrillation (HCC) 06/29/2022   Prolonged Q-T interval on ECG 05/13/2019   Recurrent major depressive disorder, in full remission (HCC) 04/06/2018   Respiratory infection 04/15/2022   Right inguinal hernia 03/02/2018   Sebaceous cyst 04/07/2019   TOBACCO ABUSE 05/21/2009   Qualifier: Diagnosis of   By: Craige Cotta MD, Vineet        Past Surgical History:  Procedure Laterality Date   CHOLECYSTECTOMY     HERNIA REPAIR      Allergies  Allergen Reactions   Ceftriaxone Hives, Shortness Of Breath and Swelling   Albuterol Other (See Comments)    HEART PALPATIONS AND JITTERY    Prior to Admission medications   Medication Sig Start Date End Date Taking? Authorizing Provider  acetaminophen (TYLENOL) 500 MG tablet Take 500 mg by mouth as needed for moderate pain.    [provider]  amiodarone (PACERONE) 200 MG tablet Take 0.5 tablets (100 mg total) by mouth daily. 12/03/22   Revankar, Aundra Dubin, MD  aspirin 81 MG EC tablet Take 81 mg by mouth daily.    [provider]  BREZTRI AEROSPHERE 160-9-4.8 MCG/ACT AERO Inhale 2 puffs into the lungs in the morning and at bedtime. 06/19/22   [provider]  Cholecalciferol (VITAMIN D) 50 MCG (2000 UT) CAPS Take 2,000 Units by mouth daily.    [provider]  diphenoxylate-atropine (LOMOTIL) 2.5-0.025 MG tablet Take 0.5 tablets by mouth as needed for diarrhea or loose stools.    [provider]  doxycycline (VIBRA-TABS) 100 MG tablet Take 1 tablet (100 mg total) by mouth every 12 (twelve) hours. Patient not taking: Reported on 12/03/2022 09/01/22   Azucena Fallen, MD  ELIQUIS 2.5 MG TABS tablet Take 2.5 mg by mouth 2 (two) times  daily. 06/12/22   [provider]  furosemide (LASIX) 20 MG tablet Take 1 tablet by mouth daily. 11/23/22 12/23/22  [provider]  levalbuterol Pauline Aus) 1.25 MG/0.5ML nebulizer solution Take 1.25 mg by nebulization as needed for wheezing or shortness of breath.    [provider]  PARoxetine (PAXIL) 20 MG tablet Take 20 mg by mouth daily. 01/23/19   [provider]  pravastatin (PRAVACHOL) 40 MG tablet Take 40 mg by mouth daily. 05/19/22   [provider]  tobramycin (TOBREX) 0.3 % ophthalmic solution Place 1-4 drops into the left eye See admin instructions. Instil 1 drop into the left eye the day before injection. Instill 2 drops  into the left eye at breakfast, lunch, and bedtime the day of injection. Instill 4 drops  into the left eye the morning after injection. 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    Social Determinants of Health   Financial Resource Strain: Not on file  Food Insecurity: Low Risk  (09/02/2022)   Received from [facility]   Hunger Vital Sign    Worried About Running Out of Food in the Last Year: Never true    Ran Out of Food in the Last Year: Never true  Transportation Needs: Not on file (09/02/2022)  Physical Activity: Not on file  Stress: Not on file  Social Connections: Not on file  Intimate Partner Violence: Not At  Risk (08/30/2022)   Humiliation, Afraid, Rape, and Kick questionnaire    Fear of Current or Ex-Partner: No    Emotionally Abused: No    Physically Abused: No    Sexually Abused: No    Family History  Problem Relation Age of Onset   Epilepsy Father    CVA Father     ROS: Otherwise negative unless mentioned in HPI  Physical Examination  Vitals:   12/07/22 0020 12/07/22 0030  BP: (!) 158/69 135/62  Pulse: 81 74  Resp: 14 15  SpO2: 97% 96%   There is no height or weight on file to calculate BMI.  General: no acute distress Cardiac: hemodynamically stable, nontachycardic Pulm: normal work of breathing GI: non-tender, no pulsatile mass  Neuro: alert, no focal deficit Extremities: Multiple bruises on lower  extremities, no cyanosis or edema Vascular:   Right: Palpable femoral, DP  Left: Palpable femoral, DP   Data:   CTA with acute IMH extending from the left subclavian artery down to the distal descending thoracic aorta.  There are multiple atherosclerotic lesions and tortuosity throughout her thoracic aorta.  There are multiple areas of contour changes in the lumen that appear to be areas of bleeding into the wall of the aorta.    ASSESSMENT/PLAN: This is a 82 y.o. 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 I had a lengthy discussion with the patient and both daughters regarding the injury and disease process. We discussed the risks and benefits of TEVAR in the acute setting including the possibility of intraoperative stroke, rupture, spinal cord ischemia, retrograde dissection, prolonged intubation, prolonged ICU stay, and death.  I also explained to them that I am concerned about the appearance of the aortic wall on the scan and despite these risks would recommend TEVAR tonight. After deliberation and discussion with her daughters she  elected to proceed.   - OR for emergent TEVAR   Daria Pastures MD [facility] [phone] 12/07/2022  12:33 AM

## 2022-12-07 NOTE — Anesthesia Preprocedure Evaluation (Addendum)
Anesthesia Evaluation  Patient identified by MRN, date of birth, ID band Patient awake    Reviewed: Allergy & Precautions, Patient's Chart, lab work & pertinent test results  Airway Mallampati: II  TM Distance: >3 FB Neck ROM: Full    Dental  (+) Poor Dentition   Pulmonary COPD,  oxygen dependent, former smoker   Pulmonary exam normal        Cardiovascular hypertension, Pt. on medications + CAD and + Peripheral Vascular Disease  + dysrhythmias (Amiodarone and Eliquis) Atrial Fibrillation  Rhythm:Regular Rate:Normal  1. Left ventricular ejection fraction, by estimation, is 55%. The left  ventricle has normal function. The left ventricle has no regional wall  motion abnormalities. There is mild concentric left ventricular  hypertrophy. Left ventricular diastolic  parameters are consistent with Grade II diastolic dysfunction  (pseudonormalization).   2. Right ventricular systolic function is normal. The right ventricular  size is normal. There is moderately elevated pulmonary artery systolic  pressure. The estimated right ventricular systolic pressure is 49.8 mmHg.   3. Left atrial size was moderately dilated.   4. The mitral valve is normal in structure. Trivial mitral valve  regurgitation. No evidence of mitral stenosis.   5. The aortic valve is tricuspid. There is mild calcification of the  aortic valve. Aortic valve regurgitation is moderate. No aortic stenosis  is present.   6. The inferior vena cava is dilated in size with <50% respiratory  variability, suggesting right atrial pressure of 15 mmHg.      Neuro/Psych   Anxiety Depression    CVA    GI/Hepatic Neg liver ROS,GERD  ,,  Endo/Other  negative endocrine ROS    Renal/GU negative Renal ROS  negative genitourinary   Musculoskeletal   Abdominal Normal abdominal exam  (+)   Peds  Hematology Lab Results      Component                Value               Date                       WBC                      10.6 (H)            12/07/2022                HGB                      8.5 (L)             12/07/2022                HCT                      27.1 (L)            12/07/2022                MCV                      90.3                12/07/2022                PLT  216                 12/07/2022             Lab Results      Component                Value               Date                      NA                       135                 12/07/2022                K                        4.3                 12/07/2022                CO2                      23                  12/07/2022                GLUCOSE                  119 (H)             12/07/2022                BUN                      28 (H)              12/07/2022                CREATININE               2.48 (H)            12/07/2022                CALCIUM                  9.1                 12/07/2022                GFRNONAA                 19 (L)              12/07/2022              Anesthesia Other Findings CT: CTA with acute IMH extending from the left subclavian artery down to the distal descending thoracic aorta.  There are multiple atherosclerotic lesions and tortuosity throughout her thoracic aorta.  There are multiple areas of contour changes in the lumen that appear to be areas of bleeding into the wall of the aorta.  Reproductive/Obstetrics                             Anesthesia Physical Anesthesia Plan  ASA: 4 and emergent  Anesthesia Plan:  General   Post-op Pain Management:    Induction: Intravenous and Rapid sequence  PONV Risk Score and Plan: 3 and Ondansetron, Dexamethasone and Treatment may vary due to age or medical condition  Airway Management Planned: Mask and Oral ETT  Additional Equipment: Arterial line  Intra-op Plan:   Post-operative Plan: Possible Post-op intubation/ventilation  Informed Consent: I  have reviewed the patients History and Physical, chart, labs and discussed the procedure including the risks, benefits and alternatives for the proposed anesthesia with the patient or authorized representative who has indicated his/her understanding and acceptance.     Dental advisory given  Plan Discussed with: CRNA  Anesthesia Plan Comments:        Anesthesia Quick Evaluation

## 2022-12-07 NOTE — ED Triage Notes (Signed)
Pt BIB [facility] from [facility].  Hematoma on aorta with bleeding into chest wall.   Pt received 2.5 mg eliquis po and Toradol at  hosp today.  Goal sbp 130 and less, goal HR 90 and less.  Pt arrived with esmolol infusing   A&Ox4.

## 2022-12-07 NOTE — ED Notes (Signed)
Esmolol infusing @ 52mcg/min 10mg  labetalol given iv at 0020  ED pharm to add orders into epic.

## 2022-12-07 NOTE — OR Nursing (Signed)
Patient had liquid bowel movement prior to coming to the OR. RN attempted to clean bowel movement but bowel continued to come out everytime I moved the patient the slightest liquid bowel movement would come out. Bowel movement filled the patients depends and was leaking out. Bowel movement incorporated the vagina inner thighs. I cleaned patient's vagina, perineum, legs etc prior to inserting a foley catheter. Rectal pouch placed due to amount and continued bowel movement. Patient stable enough at time for insert of both foley and rectal pouch. MD Hetty Blend aware.

## 2022-12-07 NOTE — Op Note (Signed)
NAME: Heather Molina    MRN: [id number] DOB: Jan 05, 1941    DATE OF OPERATION: 12/07/2022  PREOP DIAGNOSIS:    IMH with impending rupture  POSTOP DIAGNOSIS:    Same  PROCEDURE:    TEVAR with coverage of LSA (31 x 31 x 20 Gore tag approximately and 31 x 31 x 20 Gore tag distally) Intrvascular u/s of ascending aorta, aortic arch and descending aorta Right femoral endarterectomy and patch angioplasty  SURGEON: Daria Pastures  ASSIST: Graceann Congress, PA  ANESTHESIA: General    EBL: 200  INDICATIONS:    Heather Molina is a 82 y.o. female with COPD on home oxygen, hypertension, CAD and A-fib on Eliquis who presented to [facility] today with acute back and chest pain noted to have an acute IMH from the LSA to the distal descending thoracic aorta with concerning signs of impending rupture.  I had an extensive discussion with the patient and both of her daughters regarding the risks and benefits of TEVAR in the acute setting, they expressed understanding and elected to proceed.  FINDINGS:   Significant IMH from the level of the left subclavian artery to the distal descending thoracic aorta Large atherosclerotic plaque in the right common femoral causing obstruction of flow after Pro-glide's deployed.  Widely patent innominate, left common carotid and celiac arteries on completion Multiphasic signals in bilateral feet after completion of right common femoral endarterectomy and patch angioplasty   TECHNIQUE:   The patient was brought to the operating room positioned supine on the operating room table.  General anesthesia was induced and preoperative antibiotics in the form of Ancef were administered.  The patient was prepped and draped in the usual sterile fashion.  A timeout was performed and no concerns were expressed. Ultrasound-guided access of bilateral common femoral arteries was obtained and Bentson wires were placed into the aorta.  Both access sites were dilated with a  [place] dilator and 2 Pro-glide closure devices per groin were deployed in a preclosed fashion.  The patient was systemically heparinized and bilateral 8 French sheaths were placed.  From the right side a glide advantage wire was easily placed around the aortic arch and into the ascending aorta.  Intravascular ultrasound demonstrated significant IMH throughout the descending thoracic aorta to the level of the left subclavian.  Intravascular ultrasound was also used to confirm sizing as well as confirm that the wire was in the true lumen throughout its course and this was exchanged for a Lunderquist wire.  From the left groin a Bentson wire was used to place a pigtail catheter into the ascending aorta.  The right groin access was then upsized to a 20 [place] dry seal sheath and a 31 x 31 x 20 cm Gore tag device was checked up to the aortic arch.  Arch aortogram was obtained in the left carotid artery was marked.  Device was deployed according to IFU with excellent placement just distal to the left carotid artery.  The bladder manage wire was then replaced through the pigtail and the pigtail pulled down to the distal descending thoracic aorta.  A second device was advanced from the right groin access and angiogram of the descending thoracic and visceral aorta were obtained to mark the celiac artery.  The second device (31 x 31 x 20 cm Gore tag) was deployed according to IFU landing just proximal to the takeoff of the celiac artery.  From the left access site the TEVAR was cannulated and wire  placed into the ascending aorta.  IVUS was used to confirm adequate placement as well as rule out a retrograde type a dissection.  A pigtail catheter was then placed over this wire from left groin access and completion angiography demonstrated excellent placement just distal to the left common carotid with the distal extent landing just proximal to the takeoff of the celiac artery.  The 20 French dry seal in the right access was  pulled back to the external iliac and iliac angiogram was obtained which did not demonstrate any iliac artery injury.  Both sheaths were removed and Pro-glide's were pulled up with excellent hemostasis.  Upon completion there were no signals in the right foot and a faint pulse at the femoral artery.  A transverse incision was made overlying the right groin access and dissection was carried down through the soft tissue and the femoral sheath to expose the common femoral, SFA and profunda arteries which were controlled with Silastic Vesseloops.  These arteries were then clamped with vascular clamps and an arteriotomy was made and extended with Potts scissors.  A bulky atherosclerotic plaque was noted to be pulled up and blocking flow.  Endarterectomy was performed with patch angioplasty extended onto the SFA.  The patch was fashioned with a 5-0 Prolene and prior to completion of the anastomosis all vessels were forward flushed and backbled.  On completion of the anastomosis there were strong multiphasic signals in bilateral feet and hemostasis was ensured.  The right groin wound was then closed in layers with 2-0 and 3-0 Vicryl and 4-0 Monocryl for the skin.  Dermabond was applied.  The patient was extubated and brought to the ICU in stable condition.  Daria Pastures, MD [facility] DATE OF DICTATION:   12/07/2022

## 2022-12-08 ENCOUNTER — Encounter (HOSPITAL_COMMUNITY): Payer: Self-pay | Admitting: Vascular Surgery

## 2022-12-08 DIAGNOSIS — G9511 Acute infarction of spinal cord (embolic) (nonembolic): Secondary | ICD-10-CM

## 2022-12-08 DIAGNOSIS — R739 Hyperglycemia, unspecified: Secondary | ICD-10-CM

## 2022-12-08 DIAGNOSIS — I7100 Dissection of unspecified site of aorta: Secondary | ICD-10-CM

## 2022-12-08 HISTORY — DX: Acute infarction of spinal cord (embolic) (nonembolic): G95.11

## 2022-12-08 HISTORY — DX: Hyperglycemia, unspecified: R73.9

## 2022-12-08 HISTORY — DX: Dissection of unspecified site of aorta: I71.00

## 2022-12-08 LAB — CBC
HCT: 24.8 % — ABNORMAL LOW (ref 36.0–46.0)
Hemoglobin: 7.8 g/dL — ABNORMAL LOW (ref 12.0–15.0)
MCH: 26.1 pg (ref 26.0–34.0)
MCHC: 31.5 g/dL (ref 30.0–36.0)
MCV: 82.9 fL (ref 80.0–100.0)
Platelets: 163 10*3/uL (ref 150–400)
RBC: 2.99 MIL/uL — ABNORMAL LOW (ref 3.87–5.11)
RDW: 17.2 % — ABNORMAL HIGH (ref 11.5–15.5)
WBC: 11.2 10*3/uL — ABNORMAL HIGH (ref 4.0–10.5)
nRBC: 0 % (ref 0.0–0.2)

## 2022-12-08 LAB — BASIC METABOLIC PANEL
Anion gap: 8 (ref 5–15)
BUN: 24 mg/dL — ABNORMAL HIGH (ref 8–23)
CO2: 23 mmol/L (ref 22–32)
Calcium: 7.9 mg/dL — ABNORMAL LOW (ref 8.9–10.3)
Chloride: 104 mmol/L (ref 98–111)
Creatinine, Ser: 1.78 mg/dL — ABNORMAL HIGH (ref 0.44–1.00)
GFR, Estimated: 28 mL/min — ABNORMAL LOW (ref >60)
Glucose, Bld: 94 mg/dL (ref 70–99)
Potassium: 4.1 mmol/L (ref 3.5–5.1)
Sodium: 135 mmol/L (ref 135–145)

## 2022-12-08 LAB — GLUCOSE, CAPILLARY
Glucose-Capillary: 120 mg/dL — ABNORMAL HIGH (ref 70–99)
Glucose-Capillary: 99 mg/dL (ref 70–99)
Glucose-Capillary: 116 mg/dL — ABNORMAL HIGH (ref 70–99)
Glucose-Capillary: 99 mg/dL (ref 70–99)

## 2022-12-08 LAB — MAGNESIUM: Magnesium: 2 mg/dL (ref 1.7–2.4)

## 2022-12-08 MED ORDER — METOPROLOL TARTRATE 12.5 MG HALF TABLET: 12.5000 mg | ORAL_TABLET | Freq: Two times a day (BID) | ORAL | Status: DC

## 2022-12-08 MED ORDER — METOPROLOL TARTRATE 25 MG PO TABS
25.0000 mg | ORAL_TABLET | Freq: Two times a day (BID) | ORAL | Status: DC
  Administered 2022-12-08 – 2022-12-12 (×9): 25 mg via ORAL
  Filled 2022-12-08 (×9): qty 1

## 2022-12-08 MED ORDER — LEVALBUTEROL HCL 0.63 MG/3ML IN NEBU: 0.6300 mg | INHALATION_SOLUTION | RESPIRATORY_TRACT | Status: DC | PRN

## 2022-12-08 NOTE — Progress Notes (Signed)
NAME:  Heather Molina, MRN:  [id number], DOB:  [birth year], LOS: 1 ADMISSION DATE:  12/06/2022, CONSULTATION DATE:  10/14 REFERRING MD:  Hetty Blend, CHIEF COMPLAINT:  post-TEVAR   History of Present Illness:  Heather Molina is an 82 y/o woman with a history of tobacco abuse who presented with sudden sharp CP, found to have an aortic aneurysm with pending aortic rupture. She was transferred from [facility] to [facility] for vascular surgery evaluation and management. She underwent emergent TEVAR overnight. This morning she cannot move her legs. She no longer has CP. She is on eliquis PTA for Afib, which was reversed with Kcentra pre-operatively. She has refused spinal drain per Vascular surgery this morning. PCCM consulted for ICU management.   Pertinent  Medical History  COPD, chronic respiratory failure on oxygen Paroxysmal Afib on DOAC Tobacco abuse GERD CVA in 2022 HTN Prolonged QT NSVT   Significant Hospital Events: Including procedures, antibiotic start and stop dates in addition to other pertinent events   10/13 presented with CP, emergent TEVAR  Interim History / Subjective:   Able to minimally wiggle left toe Hypertensive w/ sbp around 180-190  Objective   Blood pressure (!) 183/69, pulse 77, temperature 98.1 F (36.7 C), temperature source Oral, resp. rate 14, height 5' (1.524 m), weight 50.6 kg, SpO2 100%.        Intake/Output Summary (Last 24 hours) at 12/08/2022 1146 Last data filed at 12/08/2022 0900 Gross per 24 hour  Intake 1727.04 ml  Output 1530 ml  Net 197.04 ml   Filed Weights   12/08/22 0400  Weight: 50.6 kg    Examination: General: critically ill appearing on mech vent HEENT: MM pink/moist; ETT in place Neuro: Aox3; weak LE able to wiggle left toe minimally CV: s1s2, RRR, no m/r/g PULM:  dim clear BS bilaterally; [place] GI: soft, bsx4 active  Extremities: warm/dry, no edema  Skin: no rashes or lesions    Resolved Hospital Problem list     Assessment & Plan:   Aortic aneurysm with pending rupture, type B, from L subclavian to descending thoracic aorta; s/p TEVAR [place] infarction post-TEVAR Plan: -VVS primary; appreciate recs -map goal >65 per VVS; sbp goal <160 will add po metoprolol  -prn labetalol and hydralazine -cont to hold [facility]; when to resume per VVS  Paroxysmal Afib, chronic Plan: -tele monitoring -cont amio -cont metoprolol -cont to hold [facility] -keep monitoring thyroid function; long term may not be a good amiodarone candidate -has been recommended for PFTs to monitor for amiodarone induced pulmonary toxicity  Chronic respiratory failure with hypoxia due to COPD Plan: -brovana, yupelri -wean [place] for sats >92%  AKI Plan: -Trend BMP / urinary output -Replace electrolytes as indicated -Avoid nephrotoxic agents, ensure adequate renal perfusion  Anemia Plan: -trend cbc  Mild troponin elevation likely due to HTN and dissection Plan: -tele monitoring  Coagulopathy, mild Plan: -cont to monitor  HTN Plan: -resume home metoprolol -prn labetalol and hydralazine  Hypothyroidism-- recent TSH 10 Plan: -hold off on treating for now; anticipate she will need supplementation -may need to stop amiodarone  Hyperglycemia Plan: -SSI and cbg monitoring   Best Practice (right click and "Reselect all SmartList Selections" daily)   Diet/type: clear liquids DVT prophylaxis: prophylactic heparin ; when to resume eliquis per VVS GI prophylaxis: PPI Lines: N/A Foley:  Yes, and it is still needed Code Status:  full code Last date of multidisciplinary goals of care discussion [10/15 patient updated at bedside]  Labs   CBC: Recent Labs  Lab 12/07/22 0040 12/07/22 0949  WBC 10.6* 13.7*  NEUTROABS 8.8*  --   HGB 8.5* 8.8*  HCT 27.1* 28.8*  MCV 90.3 85.2  PLT 216 166    Basic Metabolic Panel: Recent Labs  Lab 12/07/22 0040 12/07/22 0949 12/08/22 0253  NA 135 135  --   K 4.3 3.9  --   CL 98 102  --   CO2 23 24  --    GLUCOSE 119* 151*  --   BUN 28* 25*  --   CREATININE 2.48* 1.94*  --   CALCIUM 9.1 7.7*  --   MG  --  2.0 2.0   GFR: Estimated Creatinine Clearance: 15.8 mL/min (A) (by C-G formula based on SCr of 1.94 mg/dL (H)). Recent Labs  Lab 12/07/22 0040 12/07/22 0949  WBC 10.6* 13.7*    Liver Function Tests: Recent Labs  Lab 12/07/22 0040  AST 25  ALT 17  ALKPHOS 86  BILITOT 1.0  PROT 5.9*  ALBUMIN 3.1*   No results for input(s): "LIPASE", "AMYLASE" in the last 168 hours. No results for input(s): "AMMONIA" in the last 168 hours.  ABG No results found for: "PHART", "PCO2ART", "PO2ART", "HCO3", "TCO2", "ACIDBASEDEF", "O2SAT"   Coagulation Profile: Recent Labs  Lab 12/07/22 0040 12/07/22 0949  INR 1.2 1.4*    Cardiac Enzymes: No results for input(s): "CKTOTAL", "CKMB", "CKMBINDEX", "TROPONINI" in the last 168 hours.  HbA1C: Hgb A1c MFr Bld  Date/Time Value Ref Range Status  12/07/2022 09:49 AM 5.2 4.8 - 5.6 % Final    Comment:    (NOTE) Pre diabetes:          5.7%-6.4%  Diabetes:              >6.4%  Glycemic control for   <7.0% adults with diabetes     CBG: Recent Labs  Lab 12/07/22 0620 12/07/22 1103 12/07/22 1612 12/07/22 2152 12/08/22 0800  GLUCAP 173* 159* 109* 104* 120*    Review of Systems:      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) (12/26/2020), Cigarette nicotine dependence without complication  (12/26/2020), COPD with acute exacerbation (HCC) (08/30/2022), Elevated brain natriuretic peptide (BNP) level (08/30/2022), G E R D (05/20/2009), Generalized edema (08/08/2019), History of CVA (cerebrovascular accident) (12/26/2020), Hypertension, Hypoxia (05/13/2019), Irritable bowel syndrome with diarrhea (09/27/2019), Leukocytosis (05/13/2019), Lipoma of torso (04/07/2019), Mixed dyslipidemia (04/06/2018), NSVT (nonsustained ventricular tachycardia) (HCC) (05/13/2019), Paroxysmal atrial fibrillation (HCC) (06/29/2022), Prolonged Q-T interval on ECG (05/13/2019), Recurrent major depressive disorder, in full remission (HCC) (04/06/2018), Respiratory infection (04/15/2022), Right inguinal hernia (03/02/2018), Sebaceous cyst (04/07/2019), and TOBACCO ABUSE (05/21/2009).   Surgical History:   Past Surgical History:  Procedure Laterality Date   CHOLECYSTECTOMY     ENDARTERECTOMY FEMORAL Right 12/07/2022   Procedure: RIGHT FEMORAL ENDARTERECTOMY;  Surgeon: Daria Pastures, MD;  Location: [facility] OR;  Service: Vascular;  Laterality: Right;   HERNIA REPAIR     PATCH ANGIOPLASTY Right 12/07/2022   Procedure: RIGHT FEMORAL PATCH ANGIOPLASTY USING BOVINE PATCH;  Surgeon: Daria Pastures, MD;  Location: [facility] OR;  Service: Vascular;  Laterality: Right;  THORACIC AORTIC ENDOVASCULAR STENT GRAFT N/A 12/07/2022   Procedure: TEVAR;  Surgeon: Daria Pastures, MD;  Location: [facility] OR;  Service: Vascular;  Laterality: N/A;   ULTRASOUND GUIDANCE FOR VASCULAR ACCESS  12/07/2022   Procedure: ULTRASOUND GUIDANCE FOR VASCULAR ACCESS;  Surgeon: Daria Pastures, MD;  Location: [facility] OR;  Service: Vascular;;     Social History:   reports that she has quit smoking. She has never used smokeless tobacco. She reports that she does not currently use alcohol. She reports that she does not currently use drugs.   Family History:  Her family history includes CVA in her father; Epilepsy in her father.   Allergies Allergies  Allergen  Reactions   Ceftriaxone Hives, Shortness Of Breath, Swelling and Rash    Redness    Albuterol Other (See Comments)    HEART PALPATIONS AND JITTERY     Home Medications  Prior to Admission medications   Medication Sig Start Date End Date Taking? Authorizing Provider  acetaminophen (TYLENOL) 500 MG tablet Take 500 mg by mouth as needed for moderate pain.    [provider]  amiodarone (PACERONE) 200 MG tablet Take 0.5 tablets (100 mg total) by mouth daily. 12/03/22   Revankar, Aundra Dubin, MD  aspirin 81 MG EC tablet Take 81 mg by mouth daily.    [provider]  BREZTRI AEROSPHERE 160-9-4.8 MCG/ACT AERO Inhale 2 puffs into the lungs in the morning and at bedtime. 06/19/22   [provider]  Cholecalciferol (VITAMIN D) 50 MCG (2000 UT) CAPS Take 2,000 Units by mouth daily.    [provider]  diphenoxylate-atropine (LOMOTIL) 2.5-0.025 MG tablet Take 0.5 tablets by mouth as needed for diarrhea or loose stools.    [provider]  doxycycline (VIBRA-TABS) 100 MG tablet Take 1 tablet (100 mg total) by mouth every 12 (twelve) hours. Patient not taking: Reported on 12/03/2022 09/01/22   Azucena Fallen, MD  ELIQUIS 2.5 MG TABS tablet Take 2.5 mg by mouth 2 (two) times daily. 06/12/22   [provider]  furosemide (LASIX) 20 MG tablet Take 1 tablet by mouth daily. 11/23/22 12/23/22  [provider]  levalbuterol Pauline Aus) 1.25 MG/0.5ML nebulizer solution Take 1.25 mg by nebulization as needed for wheezing or shortness of breath.    [provider]  PARoxetine (PAXIL) 20 MG tablet Take 20 mg by mouth daily. 01/23/19   [provider]  pravastatin (PRAVACHOL) 40 MG tablet Take 40 mg by mouth daily. 05/19/22   [provider]  tobramycin (TOBREX) 0.3 % ophthalmic solution Place 1-4 drops into the left eye See admin instructions. Instil 1 drop into the left eye the day before injection. Instill 2 drops  into the left eye  at breakfast, lunch, and bedtime the day of injection. Instill 4 drops  into the left eye the morning after injection. Patient not taking: Reported on 12/03/2022    [provider]          JD Anselm Lis [place] [facility] 12/08/2022, 11:47 AM  Please see [email or web address] for pager details.  From 7A-7P if no response, please call [phone]. After hours, please call ELink [phone].

## 2022-12-08 NOTE — Progress Notes (Addendum)
  Progress Note    12/08/2022 6:39 AM 1 Day Post-Op  Subjective:  in good spirits this morning.  Says she has started wiggling her toes.  She says that she got the same pain in her back earlier this morning that brought her to the hospital.  RN reports her systolic was in 200's and she received labetalol and this relieved her pain.  No further issues.   Afebrile HR 70's-80's NSR 180's-190's systolic 100% 2LO2NC  Gtts:  none  Vitals:   12/08/22 0500 12/08/22 0600  BP: (!) 199/74 (!) 191/69  Pulse: 86 83  Resp: 14 13  Temp:  98.4 F (36.9 C)  SpO2: 100% 100%    Physical Exam: General:  no distress; resting comfortably Cardiac:  regular Lungs:  non labored Incisions:  right groin clean and dry without hematoma Extremities:  bilateral feet are warm and well perfused.  Able to wiggle toes on left foot.  RN reports she has slight movement of toes on the right earlier today.  Sensation is returning.  Abdomen:  soft  CBC    Component Value Date/Time   WBC 13.7 (H) 12/07/2022 0949   RBC 3.38 (L) 12/07/2022 0949   HGB 8.8 (L) 12/07/2022 0949   HCT 28.8 (L) 12/07/2022 0949   PLT 166 12/07/2022 0949   MCV 85.2 12/07/2022 0949   MCH 26.0 12/07/2022 0949   MCHC 30.6 12/07/2022 0949   RDW 17.3 (H) 12/07/2022 0949   LYMPHSABS 0.5 (L) 12/07/2022 0040   MONOABS 1.2 (H) 12/07/2022 0040   EOSABS 0.1 12/07/2022 0040   BASOSABS 0.1 12/07/2022 0040    BMET    Component Value Date/Time   NA 135 12/07/2022 0949   K 3.9 12/07/2022 0949   CL 102 12/07/2022 0949   CO2 24 12/07/2022 0949   GLUCOSE 151 (H) 12/07/2022 0949   BUN 25 (H) 12/07/2022 0949   CREATININE 1.94 (H) 12/07/2022 0949   CALCIUM 7.7 (L) 12/07/2022 0949   GFRNONAA 25 (L) 12/07/2022 0949   GFRAA >60 05/14/2019 0414    INR    Component Value Date/Time   INR 1.4 (H) 12/07/2022 0949     Intake/Output Summary (Last 24 hours) at 12/08/2022 0639 Last data filed at 12/08/2022 0600 Gross per 24 hour  Intake  2450.87 ml  Output 1880 ml  Net 570.87 ml      Assessment/Plan:  82 y.o. female is s/p:  TEVAR with coverage of LSA Right femoral endarterectomy and patch angioplasty  1 Day Post-Op   -pt bilateral feet are warm and well perfused.   -starting to wiggle toes with left > right.   -hypertensive-not treating to keep MAP up for spinal cord perfusion.  She did have back pain similar to what brought her to the hospital earlier this morning.  She received labetalol and this relieved her pain.   -AKI-creatinine improved to 1.94 yesterday from 2.48 -acute blood loss anemia is stable (has received 1 PRBC) -DVT prophylaxis:  sq heparin   Doreatha Massed, PA-C [facility] [phone] 12/08/2022 6:39 AM  VASCULAR STAFF ADDENDUM: I have independently interviewed and examined the patient. I agree with the above.  Bilateral groin stable, minimal L toe wiggle, no ankle, hip or knee movement. With minimal/no improvement in LE motor function, plan to relax MAP goal to 65 Ok to transfer to floor  Daria Pastures MD [facility] Phone Number: [phone] 12/08/2022 7:51 AM

## 2022-12-08 NOTE — Evaluation (Signed)
Physical Therapy Evaluation Patient Details Name: Heather Molina MRN: [id number] DOB: 07-06-1940 Today's Date: 12/08/2022  History of Present Illness  82 y.o. female presenting from [facility] 10/14 with concern aortic aneurysm. 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 PMH significant for COPD on home O2, CAD, HTN, A-fib, CVA.   Clinical Impression  Pt admitted with above. Pt was indep without AD PTA. Pt now with no active movement in bilat LEs from hips down to toes. Pt reports and demos full sensation but not active movement. Pt with poor sitting balance requring mod/maxA to maintain EOB balance. Pt now requiring maxAx2 for all mobility. Pt tolerated lateral scoot transfer well to drop arm recliner. Pt to greatly benefit from aggressive rehab program > 3 hrs a day to learn w/c level of function. Acute PT to cont to follow.      If plan is discharge home, recommend the following: Two people to help with walking and/or transfers;Two people to help with bathing/dressing/bathroom;Help with stairs or ramp for entrance;Assist for transportation   Can travel by private vehicle        Equipment Recommendations  (TBD, most likely w/c with w/c cushion, hospital bed)  Recommendations for Other Services  Rehab consult    Functional Status Assessment Patient has had a recent decline in their functional status and demonstrates the ability to make significant improvements in function in a reasonable and predictable amount of time.     Precautions / Restrictions Precautions Precautions: Fall Precaution Comments: unable to move bilat LEs Restrictions Weight Bearing Restrictions: No      Mobility  Bed Mobility Overal bed mobility: Needs Assistance Bed Mobility: Rolling, Sidelying to Sit Rolling: Max assist Sidelying to sit: Mod  assist, Max assist, +2 for physical assistance       General bed mobility comments: max directional cues, maxA for LE management due to inability to move them, attempted to used UEs    Transfers Overall transfer level: Needs assistance Equipment used:  (lateral scoot with bed pad) Transfers: Bed to chair/wheelchair/BSC            Lateral/Scoot Transfers: Max assist, +2 physical assistance General transfer comment: pt unable to move LEs at all, poor static sitting balance, PT and OT worked together with bed pad under bottom and completed lateral scoot to drop arm recliner, pt did attempt to use UEs to assist with transfer    Ambulation/Gait               General Gait Details: unable  Stairs            Wheelchair Mobility     Tilt Bed    Modified Rankin (Stroke Patients Only)       Balance Overall balance assessment: Needs assistance Sitting-balance support: Feet unsupported, Bilateral upper extremity supported Sitting balance-Leahy Scale: Poor Sitting balance - Comments: requiring modA for static balance, maxA for dynamic sitting balance, pt unable to find center of gravity, strong posterior bias and unable to bring trunk forward to neutral Postural control: Posterior lean                                   Pertinent Vitals/Pain Pain Assessment Pain Assessment: No/denies pain    Home Living Family/patient expects to be  discharged to:: Private residence Living Arrangements: Children Available Help at Discharge: Family Type of Home: House Home Access: Ramped entrance;Stairs to enter       Home Layout: One level Home Equipment: Pharmacist, hospital (2 wheels);Cane - single point      Prior Function Prior Level of Function : Independent/Modified Independent             Mobility Comments: started using RW a few days ago ADLs Comments: indep     Extremity/Trunk Assessment   Upper Extremity Assessment Upper Extremity  Assessment: Defer to OT evaluation    Lower Extremity Assessment Lower Extremity Assessment:  (pt with no active movement in bilat LEs from hip down to toes, reports normal sensation and able to identify where PT was touching with her eyes closed t/o bilat LEs, no patella tendon reflex bilaterally)    Cervical / Trunk Assessment Cervical / Trunk Assessment: Normal  Communication   Communication Communication: No apparent difficulties  Cognition Arousal: Alert Behavior During Therapy: WFL for tasks assessed/performed Overall Cognitive Status: Within Functional Limits for tasks assessed                                 General Comments: fully aware of deficits        General Comments General comments (skin integrity, edema, etc.): bruising t/o LEs, VSS, pt on 2lO2 via [place]    Exercises     Assessment/Plan    PT Assessment Patient needs continued PT services  PT Problem List Decreased strength;Decreased range of motion;Decreased activity tolerance;Decreased balance;Decreased mobility;Decreased coordination;Decreased cognition       PT Treatment Interventions Functional mobility training;DME instruction;Therapeutic activities;Therapeutic exercise;Balance training;Neuromuscular re-education    PT Goals (Current goals can be found in the Care Plan section)  Acute Rehab PT Goals Patient Stated Goal: get my legs working again PT Goal Formulation: With patient Time For Goal Achievement: 12/22/22 Potential to Achieve Goals: Good    Frequency Min 1X/week     Co-evaluation PT/OT/SLP Co-Evaluation/Treatment: Yes Reason for Co-Treatment: Complexity of the patient's impairments (multi-system involvement) PT goals addressed during session: Mobility/safety with mobility         AM-PAC PT "6 Clicks" Mobility  Outcome Measure Help needed turning from your back to your side while in a flat bed without using bedrails?: A Lot Help needed moving from lying on your back  to sitting on the side of a flat bed without using bedrails?: A Lot Help needed moving to and from a bed to a chair (including a wheelchair)?: A Lot Help needed standing up from a chair using your arms (e.g., wheelchair or bedside chair)?: Total Help needed to walk in hospital room?: Total Help needed climbing 3-5 steps with a railing? : Total 6 Click Score: 9    End of Session Equipment Utilized During Treatment: Oxygen Activity Tolerance: Patient tolerated treatment well Patient left: in chair;with call bell/phone within reach;with chair alarm set Nurse Communication: Mobility status;Need for lift equipment (instructed RN to use maxiskay) PT Visit Diagnosis: Unsteadiness on feet (R26.81);Muscle weakness (generalized) (M62.81);Difficulty in walking, not elsewhere classified (R26.2)    Time: 9147-8295 PT Time Calculation (min) (ACUTE ONLY): 49 min   Charges:   PT Evaluation $PT Eval Moderate Complexity: 1 Mod PT Treatments $Therapeutic Activity: 8-22 mins PT General Charges $$ ACUTE PT VISIT: 1 Visit         Lewis Shock, PT, DPT Acute Rehabilitation Services Secure chat  preferred Office #: [phone]   Iona Hansen 12/08/2022, 2:50 PM

## 2022-12-08 NOTE — Progress Notes (Signed)
Inpatient Rehab Admissions Coordinator:  ? ?Per therapy recommendations,  patient was screened for CIR candidacy by Devaney Segers, MS, CCC-SLP. At this time, Pt. Appears to be a a potential candidate for CIR. I will place   order for rehab consult per protocol for full assessment. Please contact me any with questions. ? ?Trine Fread, MS, CCC-SLP ?Rehab Admissions Coordinator  ?[phone] (celll) ?[phone] (office) ? ?

## 2022-12-08 NOTE — Evaluation (Signed)
Occupational Therapy Evaluation Patient Details Name: Heather Molina MRN: [id number] DOB: [birth year] Today's Date: 12/08/2022   History of Present Illness 82 y.o. female presenting from [facility] 10/14 with concern aortic aneurysm. 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 PMH significant for COPD on home O2, CAD, HTN, A-fib, CVA.   Clinical Impression   PTA, pt lived with her daughter and was independent in ADL and IADL without AD. Upon eval, pt with no active ROM in BLE from hips distally, decreased sitting balance, mobility, safety, and knowledge of DME/AE/compensatory techniques for decreased mobility. Pt requiring max A of 2 for bed mobility and lateral transfers. Additionally requiring mod-max A for sitting balance during static sitting and ADL. Due to significant change in functional status, recommending intensive multidisciplinary rehabilitation >3 hours/day to optimize safety and independence in ADL.         If plan is discharge home, recommend the following: Two people to help with walking and/or transfers;Assistance with cooking/housework;Two people to help with bathing/dressing/bathroom;Assist for transportation;Help with stairs or ramp for entrance    Functional Status Assessment  Patient has had a recent decline in their functional status and demonstrates the ability to make significant improvements in function in a reasonable and predictable amount of time.  Equipment Recommendations  Other (comment) (defer next venue)    Recommendations for Other Services Rehab consult     Precautions / Restrictions Precautions Precautions: Fall Precaution Comments: unable to move bilat LEs Restrictions Weight Bearing Restrictions: No      Mobility Bed Mobility Overal bed mobility: Needs Assistance Bed Mobility: Rolling,  Sidelying to Sit Rolling: Max assist Sidelying to sit: Mod assist, Max assist, +2 for physical assistance       General bed mobility comments: max directional cues, maxA for LE management due to inability to move them, attempted to used UEs    Transfers Overall transfer level: Needs assistance Equipment used:  (lateral scoot with bed pad) Transfers: Bed to chair/wheelchair/BSC            Lateral/Scoot Transfers: Max assist, +2 physical assistance General transfer comment: pt unable to move LEs at all, poor static sitting balance, PT and OT worked together with bed pad under bottom and completed lateral scoot to drop arm recliner, pt did attempt to use UEs to assist with transfer      Balance Overall balance assessment: Needs assistance Sitting-balance support: Feet unsupported, Bilateral upper extremity supported Sitting balance-Leahy Scale: Poor Sitting balance - Comments: requiring modA for static balance, maxA for dynamic sitting balance, pt unable to find center of gravity, strong posterior bias and unable to bring trunk forward to neutral Postural control: Posterior lean                                 ADL either performed or assessed with clinical judgement   ADL Overall ADL's : Needs assistance/impaired Eating/Feeding: Bed level;Independent   Grooming: Oral care;Moderate assistance;Maximal assistance;Sitting Grooming Details (indicate cue type and reason): for sitting balance. Upper Body Bathing: Sitting;Moderate assistance   Lower Body Bathing: Total assistance   Upper Body Dressing : Maximal assistance;Sitting   Lower Body Dressing: Total assistance;Sitting/lateral leans   Toilet Transfer: Maximal assistance;+2 for physical assistance;+2 for safety/equipment Toilet Transfer Details (indicate cue type and reason): lateral  scoot simulated to chair         Functional mobility during ADLs: Maximal assistance;+2 for physical assistance;+2 for  safety/equipment       Vision Patient Visual Report: No change from baseline Vision Assessment?: No apparent visual deficits     Perception Perception: Not tested       Praxis Praxis: Not tested       Pertinent Vitals/Pain Pain Assessment Pain Assessment: No/denies pain     Extremity/Trunk Assessment Upper Extremity Assessment Upper Extremity Assessment: Generalized weakness   Lower Extremity Assessment Lower Extremity Assessment: Defer to PT evaluation   Cervical / Trunk Assessment Cervical / Trunk Assessment: Normal   Communication Communication Communication: No apparent difficulties   Cognition Arousal: Alert Behavior During Therapy: WFL for tasks assessed/performed Overall Cognitive Status: Within Functional Limits for tasks assessed                                 General Comments: fully aware of deficits; following all commands. Receptive to new learning with new mobility challenges     General Comments  bruising t/o LEs, VSS, pt on 2lO2 via [place]; slight edema in RUE using functionally    Exercises     Shoulder Instructions      Home Living Family/patient expects to be discharged to:: Private residence Living Arrangements: Children Available Help at Discharge: Family Type of Home: House Home Access: Ramped entrance;Stairs to enter Entrance Stairs-Number of Steps: 3 or 4 short steps   Home Layout: One level     Bathroom Shower/Tub: Producer, television/film/video: Standard     Home Equipment: Pharmacist, hospital (2 wheels);Cane - single point          Prior Functioning/Environment Prior Level of Function : Independent/Modified Independent             Mobility Comments: started using RW a few days ago ADLs Comments: indep        OT Problem List: Decreased strength;Decreased activity tolerance;Impaired balance (sitting and/or standing);Decreased knowledge of use of DME or AE      OT Treatment/Interventions:  Self-care/ADL training;Therapeutic exercise;DME and/or AE instruction;Balance training;Patient/family education;Therapeutic activities    OT Goals(Current goals can be found in the care plan section) Acute Rehab OT Goals Patient Stated Goal: get better OT Goal Formulation: With patient Time For Goal Achievement: 12/22/22 Potential to Achieve Goals: Good  OT Frequency: Min 1X/week    Co-evaluation PT/OT/SLP Co-Evaluation/Treatment: Yes Reason for Co-Treatment: Complexity of the patient's impairments (multi-system involvement) PT goals addressed during session: Mobility/safety with mobility OT goals addressed during session: ADL's and self-care      AM-PAC OT "6 Clicks" Daily Activity     Outcome Measure Help from another person eating meals?: None Help from another person taking care of personal grooming?: A Lot Help from another person toileting, which includes using toliet, bedpan, or urinal?: Total Help from another person bathing (including washing, rinsing, drying)?: A Lot Help from another person to put on and taking off regular upper body clothing?: A Lot Help from another person to put on and taking off regular lower body clothing?: Total 6 Click Score: 12   End of Session Equipment Utilized During Treatment: Oxygen Nurse Communication: Mobility status;Need for lift equipment (lift pad under pt)  Activity Tolerance: Patient tolerated treatment well Patient left: in chair;with call bell/phone within reach  OT Visit Diagnosis: Unsteadiness on feet (R26.81);Muscle weakness (generalized) (M62.81);Other abnormalities  of gait and mobility (R26.89);History of falling (Z91.81);Hemiplegia and hemiparesis                Time: 2130-8657 OT Time Calculation (min): 43 min Charges:  OT General Charges $OT Visit: 1 Visit OT Evaluation $OT Eval Moderate Complexity: 1 Mod  Tyler Deis, OTR/L [facility] Acute Rehabilitation Office: [phone]   Myrla Halsted 12/08/2022, 3:46  PM

## 2022-12-09 LAB — CBC
HCT: 23.7 % — ABNORMAL LOW (ref 36.0–46.0)
Hemoglobin: 7.6 g/dL — ABNORMAL LOW (ref 12.0–15.0)
MCH: 27 pg (ref 26.0–34.0)
MCHC: 32.1 g/dL (ref 30.0–36.0)
MCV: 84.3 fL (ref 80.0–100.0)
Platelets: 153 10*3/uL (ref 150–400)
RBC: 2.81 MIL/uL — ABNORMAL LOW (ref 3.87–5.11)
RDW: 17.1 % — ABNORMAL HIGH (ref 11.5–15.5)
WBC: 9.4 10*3/uL (ref 4.0–10.5)
nRBC: 0 % (ref 0.0–0.2)

## 2022-12-09 LAB — BASIC METABOLIC PANEL
Anion gap: 9 (ref 5–15)
BUN: 28 mg/dL — ABNORMAL HIGH (ref 8–23)
CO2: 24 mmol/L (ref 22–32)
Calcium: 8.1 mg/dL — ABNORMAL LOW (ref 8.9–10.3)
Chloride: 100 mmol/L (ref 98–111)
Creatinine, Ser: 1.64 mg/dL — ABNORMAL HIGH (ref 0.44–1.00)
GFR, Estimated: 31 mL/min — ABNORMAL LOW (ref >60)
Glucose, Bld: 96 mg/dL (ref 70–99)
Potassium: 4.4 mmol/L (ref 3.5–5.1)
Sodium: 133 mmol/L — ABNORMAL LOW (ref 135–145)

## 2022-12-09 LAB — MAGNESIUM: Magnesium: 2 mg/dL (ref 1.7–2.4)

## 2022-12-09 LAB — GLUCOSE, CAPILLARY
Glucose-Capillary: 90 mg/dL (ref 70–99)
Glucose-Capillary: 86 mg/dL (ref 70–99)
Glucose-Capillary: 123 mg/dL — ABNORMAL HIGH (ref 70–99)
Glucose-Capillary: 106 mg/dL — ABNORMAL HIGH (ref 70–99)

## 2022-12-09 NOTE — Plan of Care (Signed)

## 2022-12-09 NOTE — Progress Notes (Addendum)
  Progress Note    12/09/2022 8:28 AM 2 Days Post-Op  Subjective:  no complaints   Vitals:   12/09/22 0345 12/09/22 0825  BP: (!) 115/92   Pulse:  71  Resp:  17  Temp: 98.9 F (37.2 C)   SpO2:  99%   Physical Exam: Lungs:  non labored Incisions:  groin incisions c/d/i Extremities:  feet warm; no motor Neurologic: a&O; no motor BLE  CBC    Component Value Date/Time   WBC 9.4 12/09/2022 0349   RBC 2.81 (L) 12/09/2022 0349   HGB 7.6 (L) 12/09/2022 0349   HCT 23.7 (L) 12/09/2022 0349   PLT 153 12/09/2022 0349   MCV 84.3 12/09/2022 0349   MCH 27.0 12/09/2022 0349   MCHC 32.1 12/09/2022 0349   RDW 17.1 (H) 12/09/2022 0349   LYMPHSABS 0.5 (L) 12/07/2022 0040   MONOABS 1.2 (H) 12/07/2022 0040   EOSABS 0.1 12/07/2022 0040   BASOSABS 0.1 12/07/2022 0040    BMET    Component Value Date/Time   NA 133 (L) 12/09/2022 0349   K 4.4 12/09/2022 0349   CL 100 12/09/2022 0349   CO2 24 12/09/2022 0349   GLUCOSE 96 12/09/2022 0349   BUN 28 (H) 12/09/2022 0349   CREATININE 1.64 (H) 12/09/2022 0349   CALCIUM 8.1 (L) 12/09/2022 0349   GFRNONAA 31 (L) 12/09/2022 0349   GFRAA >60 05/14/2019 0414    INR    Component Value Date/Time   INR 1.4 (H) 12/07/2022 0949     Intake/Output Summary (Last 24 hours) at 12/09/2022 0828 Last data filed at 12/08/2022 2200 Gross per 24 hour  Intake 13.58 ml  Output 594 ml  Net -580.42 ml     Assessment/Plan:  82 y.o. female is s/p TEVAR for IMH 2 Days Post-Op   Groin incisions healing well No motor BLE, minimal toe wiggle L foot D/c foley; will need long term foley if incontinent PT/OT recommending CIR; ok for d/c to CIR when bed is approved   Emilie Rutter, PA-C [facility] [phone] 12/09/2022 8:28 AM    VASCULAR STAFF ADDENDUM: I have independently interviewed and examined the patient. I agree with the above.   Daria Pastures MD [facility] Phone  Number: [phone] 12/09/2022 12:22 PM

## 2022-12-09 NOTE — Progress Notes (Signed)
Inpatient Rehab Admissions Coordinator:   I met with pt. To discuss potential CIR admit. She is interested. Feels her daughter that she lives with can provide needed support. I called daughter to discuss and left VM with request for callback.   Megan Salon, MS, CCC-SLP Rehab Admissions Coordinator  [phone] (celll) [phone] (office)

## 2022-12-09 NOTE — PMR Pre-admission (Signed)
PMR Admission Coordinator Pre-Admission Assessment  Patient: Heather Molina is an 82 y.o., female MRN: [id number] DOB: 01/31/1941 Height: 5' (152.4 cm) Weight: 50.6 kg  Insurance Information HMO:     PPO:      PCP:      IPA:      80/20: yes     OTHER:    PRIMARY: Medicare A and B       Policy#: [id number]     Phone#: Verified online    Fax#:  Pre-Cert#:       Employer:  Benefits:  Phone #:      Name:  Eff. Date: Parts A  and B effective 03/26/2004 Deduct: $1632      Out of Pocket Max:  None      Life Max: N/A  CIR: 100%      SNF: 100 days Outpatient: 80%     Co-Pay: 20% Home Health: 100%      Co-Pay: none DME: 80%     Co-Pay: 20% Providers: patient's choice  SECONDARY: A[employer]     Policy#: [id number]      Phone#: [phone]  Financial Counselor:       Phone#:   The "Data Collection Information Summary" for patients in Inpatient Rehabilitation Facilities with attached "Privacy Act Statement-Health Care Records" was provided and verbally reviewed with: Patient  Emergency Contact Information Contact Information     Name Relation Home Work Mobile   [place] Daughter   [phone]      Other Contacts   None on File     Current Medical History  Patient Admitting Diagnosis: Spinal Cord Infarct, BLE weakness s/p TEVAR  History of Present Illness: Pt. is a 82 y.o. female with a history of COPD on home oxygen, HTN, CAD, and A-fib on amiodarone and Eliquis who transferred to [facility] from [facility] 11/27/22.  Vascular and Vein Specailsts at  [facility] were contacted by [facility] due to CT with acute IMH and possible active bleed.  She was started on esmolol and transferred to [facility]. She presented to the emergency department in [place] with acute onset back and chest pain. She describes it as a sharp pain that progressed into a tightness feeling. 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  There are multiple atherosclerotic lesions and tortuosity throughout her thoracic aorta.  There are multiple areas of contour changes in the lumen that appear to be areas of bleeding into the wall of the aorta. Pt. Underwent TEVAR with coverage of LSA and Right femoral endarterectomy and patch angioplasty  on 12/07/22 with [facility]. Post-op, Pt. Was unable to move her legs. 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 Pt. Seen by PT/OT and they recommend CIR to assist return to PLOF.    Patient's medical record from [facility] has been reviewed by the rehabilitation admission coordinator and physician.  Past Medical History  Past Medical History:  Diagnosis Date   Acute foot pain, right 04/15/2022   Acute on chronic respiratory failure with hypoxia (HCC) 08/30/2022   Age-related osteoporosis without current pathological fracture 04/06/2018   ALLERGIC RHINITIS 05/20/2009   Qualifier: Diagnosis of   By: Excell Seltzer CMA, Lawson Fiscal      Replacing diagnoses that were inactivated after the 05/25/22 regulatory import   Anxiety  Arthritis, multiple joint involvement 04/06/2018   C O P D 05/20/2009   Qualifier: Diagnosis of   By: Excell Seltzer CMA, Lori      Replacing diagnoses that were inactivated after the 05/25/22 regulatory import   CAD (coronary artery disease) 06/29/2022   Calculus of gallbladder with chronic cholecystitis without obstruction 04/30/2016   CAP (community acquired pneumonia) 05/13/2019   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/26/2020   Cigarette nicotine dependence without complication 12/26/2020   COPD with acute exacerbation (HCC) 08/30/2022   Elevated brain natriuretic peptide (BNP) level 08/30/2022   G E R D 05/20/2009   Qualifier: Diagnosis of   By: Excell Seltzer CMA, Lori       Generalized edema 08/08/2019   History of CVA  (cerebrovascular accident) 12/26/2020   Hypertension    Hypoxia 05/13/2019   Irritable bowel syndrome with diarrhea 09/27/2019   Leukocytosis 05/13/2019   Lipoma of torso 04/07/2019   Mixed dyslipidemia 04/06/2018   NSVT (nonsustained ventricular tachycardia) (HCC) 05/13/2019   Paroxysmal atrial fibrillation (HCC) 06/29/2022   Prolonged Q-T interval on ECG 05/13/2019   Recurrent major depressive disorder, in full remission (HCC) 04/06/2018   Respiratory infection 04/15/2022   Right inguinal hernia 03/02/2018   Sebaceous cyst 04/07/2019   TOBACCO ABUSE 05/21/2009   Qualifier: Diagnosis of   By: Craige Cotta MD, Vineet        Has the patient had major surgery during 100 days prior to admission? Yes  Family History   family history includes CVA in her father; Epilepsy in her father.  Current Medications  Current Facility-Administered Medications:    acetaminophen (TYLENOL) tablet 325-650 mg, 325-650 mg, Oral, Q4H PRN, 650 mg at 12/10/22 2326 **OR** acetaminophen (TYLENOL) suppository 325-650 mg, 325-650 mg, Rectal, Q4H PRN, Baglia, Corrina, PA-C   alum & mag hydroxide-simeth (MAALOX/MYLANTA) 200-200-20 MG/5ML suspension 15-30 mL, 15-30 mL, Oral, Q2H PRN, Baglia, Corrina, PA-C   amiodarone (PACERONE) tablet 100 mg, 100 mg, Oral, Daily, Baglia, Corrina, PA-C, 100 mg at 12/11/22 0916   arformoterol (BROVANA) nebulizer solution 15 mcg, 15 mcg, Nebulization, BID, Steffanie Dunn, DO, 15 mcg at 12/11/22 1610   aspirin EC tablet 81 mg, 81 mg, Oral, Daily, Baglia, Corrina, PA-C, 81 mg at 12/11/22 0916   bisacodyl (DULCOLAX) EC tablet 5 mg, 5 mg, Oral, Daily PRN, Baglia, Corrina, PA-C   Chlorhexidine Gluconate Cloth 2 % PADS 6 each, 6 each, Topical, Daily, Daria Pastures, MD, 6 each at 12/11/22 0918   guaiFENesin-dextromethorphan (ROBITUSSIN DM) 100-10 MG/5ML syrup 15 mL, 15 mL, Oral, Q4H PRN, Baglia, Corrina, PA-C   heparin injection 5,000 Units, 5,000 Units, Subcutaneous, Q8H, Baglia, Corrina, PA-C,  5,000 Units at 12/11/22 9604   hydrALAZINE (APRESOLINE) injection 5 mg, 5 mg, Intravenous, Q20 Min PRN, Baglia, Corrina, PA-C, 5 mg at 12/10/22 0339   HYDROmorphone (DILAUDID) injection 0.5-1 mg, 0.5-1 mg, Intravenous, Q2H PRN, Baglia, Corrina, PA-C   insulin aspart (novoLOG) injection 0-9 Units, 0-9 Units, Subcutaneous, TID WC, Steffanie Dunn, DO, 2 Units at 12/10/22 1145   levalbuterol (XOPENEX) nebulizer solution 0.63 mg, 0.63 mg, Nebulization, Q3H PRN, Pia Mau D, PA-C   magnesium sulfate IVPB 2 g 50 mL, 2 g, Intravenous, Daily PRN, Baglia, Corrina, PA-C   metoprolol tartrate (LOPRESSOR) injection 2-5 mg, 2-5 mg, Intravenous, Q2H PRN, Baglia, Corrina, PA-C   metoprolol tartrate (LOPRESSOR) tablet 25 mg, 25 mg, Oral, BID, Lidia Collum, PA-C, 25 mg at 12/11/22 0916   norepinephrine (LEVOPHED) 4mg  in (0.016 mg/mL)  premix infusion, 0-40 mcg/min, Intravenous, Titrated, Randon Goldsmith, CRNA, Stopped at 12/07/22 1615   oxyCODONE (Oxy IR/ROXICODONE) immediate release tablet 5-10 mg, 5-10 mg, Oral, Q4H PRN, Baglia, Corrina, PA-C   pantoprazole (PROTONIX) EC tablet 40 mg, 40 mg, Oral, Daily, Baglia, Corrina, PA-C, 40 mg at 12/11/22 0916   PARoxetine (PAXIL) tablet 20 mg, 20 mg, Oral, Daily, Baglia, Corrina, PA-C, 20 mg at 12/11/22 0916   phenol (CHLORASEPTIC) mouth spray 1 spray, 1 spray, Mouth/Throat, PRN, Baglia, Corrina, PA-C   potassium chloride SA (KLOR-CON M) CR tablet 20-40 mEq, 20-40 mEq, Oral, Daily PRN, Baglia, Corrina, PA-C   pravastatin (PRAVACHOL) tablet 40 mg, 40 mg, Oral, Daily, Baglia, Corrina, PA-C, 40 mg at 12/11/22 5621   revefenacin (YUPELRI) nebulizer solution 175 mcg, 175 mcg, Nebulization, Daily, Steffanie Dunn, DO, 175 mcg at 12/11/22 0803   senna-docusate (Senokot-S) tablet 1 tablet, 1 tablet, Oral, QHS PRN, Baglia, Corrina, PA-C  Patients Current Diet:  Diet Order             Diet Heart Room service appropriate? Yes; Fluid consistency: Thin  Diet effective now                    Precautions / Restrictions Precautions Precautions: Fall Precaution Comments: incomplete paraplegia Restrictions Weight Bearing Restrictions: No   Has the patient had 2 or more falls or a fall with injury in the past year? No  Prior Activity Level Limited Community (1-2x/wk): Pt. went out for appointments  Prior Functional Level Self Care: Did the patient need help bathing, dressing, using the toilet or eating? Independent  Indoor Mobility: Did the patient need assistance with walking from room to room (with or without device)? Independent  Stairs: Did the patient need assistance with internal or external stairs (with or without device)? Independent  Functional Cognition: Did the patient need help planning regular tasks such as shopping or remembering to take medications? Needed some help  Patient Information Are you of Hispanic, Latino/a,or Spanish origin?: A. No, not of Hispanic, Latino/a, or Spanish origin What is your race?: A. White Do you need or want an interpreter to communicate with a doctor or health care staff?: 1. Yes  Patient's Response To:  Health Literacy and Transportation Is the patient able to respond to health literacy and transportation needs?: Yes Health Literacy - How often do you need to have someone help you when you read instructions, pamphlets, or other written material from your doctor or pharmacy?: Never In the past 12 months, has lack of transportation kept you from medical appointments or from getting medications?: No In the past 12 months, has lack of transportation kept you from meetings, work, or from getting things needed for daily living?: No  Journalist, newspaper / Equipment Home Equipment: Shower seat, Agricultural consultant (2 wheels), [employer] - single point  Prior Device Use: Indicate devices/aids used by the patient prior to current illness, exacerbation or injury? None of the above  Current Functional Level Cognition   Overall Cognitive Status: Impaired/Different from baseline Orientation Level: Oriented X4 Safety/Judgement: Decreased awareness of deficits General Comments: decreased awareness of balance deficits and need to correct    Extremity Assessment (includes Sensation/Coordination)  Upper Extremity Assessment: Generalized weakness  Lower Extremity Assessment: Defer to PT evaluation    ADLs  Overall ADL's : Needs assistance/impaired Eating/Feeding: Bed level, Independent Grooming: Oral care, Moderate assistance, Maximal assistance, Sitting Grooming Details (indicate cue type and reason): for sitting balance. Upper Body Bathing: Sitting, Moderate assistance  Lower Body Bathing: Total assistance Upper Body Dressing : Maximal assistance, Sitting Lower Body Dressing: Total assistance, Sitting/lateral leans Toilet Transfer: Maximal assistance, +2 for physical assistance, +2 for safety/equipment Toilet Transfer Details (indicate cue type and reason): lateral scoot simulated to chair Functional mobility during ADLs: Maximal assistance, +2 for physical assistance, +2 for safety/equipment    Mobility  Overal bed mobility: Needs Assistance Bed Mobility: Rolling, Sidelying to Sit Rolling: Mod assist, Used rails Sidelying to sit: Mod assist Sit to supine: Max assist General bed mobility comments: pt cued to use pad under thigh to lift thigh for bending Rt knee with therapist moving LLE. pt able to reach for rail and roll upper body then utilize UB to move legs off bed with mod assist to elevate trunk to sitting.    Transfers  Overall transfer level: Needs assistance Equipment used:  (lateral scoot with bed pad) Transfers: Bed to chair/wheelchair/BSC Bed to/from chair/wheelchair/BSC transfer type:: Lateral/scoot transfer  Lateral/Scoot Transfers: Max assist, +2 physical assistance General transfer comment: max assist with +2 for safety for seqeuntial lateral scoot to drop arm chair with use of pad  under sacrum. Max +2 to scoot back in chair    Ambulation / Gait / Stairs / Wheelchair Mobility  Ambulation/Gait General Gait Details: unable    Posture / Balance Dynamic Sitting Balance Sitting balance - Comments: mod assist for sitting balance with cues for midline and UB support on surface. With UB support pt able to maintain midline grossly 20 sec at a time with max assist to recover with LOB both posteriorly and anteriorly. Pt fatigues with back pain after 7 min EOB. Balance Overall balance assessment: Needs assistance Sitting-balance support: Feet unsupported, Bilateral upper extremity supported Sitting balance-Leahy Scale: Poor Sitting balance - Comments: mod assist for sitting balance with cues for midline and UB support on surface. With UB support pt able to maintain midline grossly 20 sec at a time with max assist to recover with LOB both posteriorly and anteriorly. Pt fatigues with back pain after 7 min EOB. Postural control: Posterior lean    Special needs/care consideration Skin surgical incision   Previous Home Environment (from acute therapy documentation) Living Arrangements: Children  Lives With: Daughter Available Help at Discharge: Family Type of Home: House Home Layout: One level Home Access: Ramped entrance, Stairs to enter [employer] of Steps: 3 or 4 short steps Bathroom Shower/Tub: Health visitor: Standard Bathroom Accessibility: Yes How Accessible: Accessible via wheelchair, Accessible via walker Home Care Services: Yes  Discharge Living Setting Plans for Discharge Living Setting: Patient's home Type of Home at Discharge: House Discharge Home Layout: One level Discharge Home Access: Ramped entrance Discharge Bathroom Shower/Tub: Tub/shower unit Discharge Bathroom Toilet: Standard Discharge Bathroom Accessibility: Yes How Accessible: Accessible via walker  Social/Family/Support Systems Patient Roles: Other (Comment) Contact  Information: [phone] Anticipated Caregiver: Philomena Doheny Caregiver Availability: 24/7 Discharge Plan Discussed with Primary Caregiver: Yes Is Caregiver In Agreement with Plan?: Yes Does Caregiver/Family have Issues with Lodging/Transportation while Pt is in Rehab?: No  Goals Patient/Family Goal for Rehab: PT/OT mod A, wheelchair level Expected length of stay: 18-21 days Pt/Family Agrees to Admission and willing to participate: Yes Program Orientation Provided & Reviewed with Pt/Caregiver Including Roles  & Responsibilities: Yes  Decrease burden of Care through IP rehab admission: not anticipated  Possible need for SNF placement upon discharge: not anticipated  Patient Condition: I have reviewed medical records from [facility], spoken with CM, and patient and daughter.  I met with patient at the bedside for inpatient rehabilitation assessment.  Patient will benefit from ongoing PT and OT, can actively participate in 3 hours of therapy a day 5 days of the week, and can make measurable gains during the admission.  Patient will also benefit from the coordinated team approach during an Inpatient Acute Rehabilitation admission.  The patient will receive intensive therapy as well as Rehabilitation physician, nursing, social worker, and care management interventions.  Due to bladder management, bowel management, safety, skin/wound care, disease management, medication administration, pain management, and patient education the patient requires 24 hour a day rehabilitation nursing.  The patient is currently mod to max+2 with mobility and max to total assist with basic ADLs.  Discharge setting and therapy post discharge at home with home health is anticipated.  Patient has agreed to participate in the Acute Inpatient Rehabilitation Program and will admit tomorrow, Saturday.  Preadmission Screen Completed By:  Trish Mage, 12/11/2022 10:06  AM ______________________________________________________________________   Discussed status with Dr. Riley Kill  on 12/11/22 at 0945 and received approval for admission tomorrow, Saturday.  Admission Coordinator:  Trish Mage, RN, time 1009/Date 12/11/22   Assessment/Plan: Diagnosis: Does the need for close, 24 hr/day Medical supervision in concert with the patient's rehab needs make it unreasonable for this patient to be served in a less intensive setting? {yes_no_potentially:3041433} Co-Morbidities requiring supervision/potential complications: *** Due to {due DG:6440347}, does the patient require 24 hr/day rehab nursing? {yes_no_potentially:3041433} Does the patient require coordinated care of a physician, rehab nurse, PT, OT, and SLP to address physical and functional deficits in the context of the above medical diagnosis(es)? {yes_no_potentially:3041433} Addressing deficits in the following areas: {deficits:3041436} Can the patient actively participate in an intensive therapy program of at least 3 hrs of therapy 5 days a week? {yes_no_potentially:3041433} The potential for patient to make measurable gains while on inpatient rehab is {potential:3041437} Anticipated functional outcomes upon discharge from inpatient rehab: {functional outcomes:304600100} PT, {functional outcomes:304600100} OT, {functional outcomes:304600100} SLP Estimated rehab length of stay to reach the above functional goals is: *** Anticipated discharge destination: {anticipated dc setting:21604} 10. Overall Rehab/Functional Prognosis: {potential:3041437}   MD Signature: ***

## 2022-12-09 NOTE — Progress Notes (Signed)
Physical Therapy Treatment Patient Details Name: Heather Molina MRN: [id number] DOB: Feb 26, 1940 Today's Date: 12/09/2022   History of Present Illness 82 y.o. female presenting from [facility] 10/14 with concern aortic aneurysm. 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 PMH significant for COPD on home O2, CAD, HTN, A-fib, CVA.    PT Comments  Pt received in supine and agreeable to session. Pt demonstrates improved bed mobility and sitting balance this session. Pt able to demonstrate improved power up with BUE to sit to EOB requiring mod A, however pt continues to require total A for BLE management throughout session. Pt demonstrates ability to minimally move L toes, but is unable to move R. Pt initially requires mod-max A to maintain sitting balance, but is able to improve to CGA with B hands on therapist's knees. Pt able to participate in anterior and lateral leans with focus on BUE and core strengthening. Pt is unable to maintain balance with single UE support. Pt continues to benefit from PT services to progress toward functional mobility goals.     If plan is discharge home, recommend the following: Two people to help with walking and/or transfers;Two people to help with bathing/dressing/bathroom;Help with stairs or ramp for entrance;Assist for transportation   Can travel by private vehicle        Equipment Recommendations       Recommendations for Other Services       Precautions / Restrictions Precautions Precautions: Fall Precaution Comments: unable to move bilat LEs Restrictions Weight Bearing Restrictions: No     Mobility  Bed Mobility Overal bed mobility: Needs Assistance Bed Mobility: Rolling, Sidelying to Sit, Sit to Supine Rolling: Mod assist, Used rails Sidelying to sit: Max assist, Used rails, HOB elevated    Sit to supine: Max assist   General bed mobility comments: Pt able to use rail to roll with mod A. total A for BLE management and mod A for trunk elevation    Transfers                   General transfer comment: did not attempt due to lack of +2        Balance Overall balance assessment: Needs assistance Sitting-balance support: Feet unsupported, Bilateral upper extremity supported Sitting balance-Leahy Scale: Poor Sitting balance - Comments: initial mod-max A for sitting balance progressing to CGA with intermittent assist with improved BUE support. Pt with R lateral lean, but is able to correct with cues. One posterior LOB requiring assist to correct. Focus on anterior and lateral leans with BUE support. Pt unable to maintain balance with single UE support without assist                                    Cognition Arousal: Alert Behavior During Therapy: [facility] for tasks assessed/performed Overall Cognitive Status: Within Functional Limits for tasks assessed                                          Exercises Other Exercises Other Exercises: Anterior leans with pt's hands on therapist's knees x10 reps Other Exercises: Lateral leans L/R with pt's hands on bed x10 reps  General Comments        Pertinent Vitals/Pain Pain Assessment Pain Assessment: No/denies pain     PT Goals (current goals can now be found in the care plan section) Acute Rehab PT Goals Patient Stated Goal: get my legs working again PT Goal Formulation: With patient Time For Goal Achievement: 12/22/22 Progress towards PT goals: Progressing toward goals    Frequency    Min 1X/week       AM-PAC PT "6 Clicks" Mobility   Outcome Measure  Help needed turning from your back to your side while in a flat bed without using bedrails?: A Lot Help needed moving from lying on your back to sitting on the side of a flat bed without using bedrails?: A Lot Help needed  moving to and from a bed to a chair (including a wheelchair)?: Total Help needed standing up from a chair using your arms (e.g., wheelchair or bedside chair)?: Total Help needed to walk in hospital room?: Total Help needed climbing 3-5 steps with a railing? : Total 6 Click Score: 8    End of Session Equipment Utilized During Treatment: Oxygen Activity Tolerance: Patient tolerated treatment well Patient left: with call bell/phone within reach;in bed;with bed alarm set Nurse Communication: Mobility status PT Visit Diagnosis: Unsteadiness on feet (R26.81);Muscle weakness (generalized) (M62.81);Difficulty in walking, not elsewhere classified (R26.2)     Time: 1610-9604 PT Time Calculation (min) (ACUTE ONLY): 27 min  Charges:    $Therapeutic Exercise: 8-22 mins $Therapeutic Activity: 8-22 mins PT General Charges $$ ACUTE PT VISIT: 1 Visit                     Johny Shock, PTA Acute Rehabilitation Services Secure Chat Preferred  [phone]    Johny Shock 12/09/2022, 4:09 PM

## 2022-12-09 NOTE — Procedures (Signed)
Inpatient Rehab Admissions Coordinator:    I spoke with Pt.'s daughter [name] and she confims she can provide 24/7 mod A and that her home is wheelchair accessible.   Megan Salon, MS, CCC-SLP Rehab Admissions Coordinator  [phone] (celll) [phone] (office)

## 2022-12-10 LAB — CBC
HCT: 23.9 % — ABNORMAL LOW (ref 36.0–46.0)
Hemoglobin: 7.5 g/dL — ABNORMAL LOW (ref 12.0–15.0)
MCH: 26.8 pg (ref 26.0–34.0)
MCHC: 31.4 g/dL (ref 30.0–36.0)
MCV: 85.4 fL (ref 80.0–100.0)
Platelets: 164 10*3/uL (ref 150–400)
RBC: 2.8 MIL/uL — ABNORMAL LOW (ref 3.87–5.11)
RDW: 16.7 % — ABNORMAL HIGH (ref 11.5–15.5)
WBC: 7.9 10*3/uL (ref 4.0–10.5)
nRBC: 0 % (ref 0.0–0.2)

## 2022-12-10 LAB — BASIC METABOLIC PANEL
Anion gap: 11 (ref 5–15)
BUN: 29 mg/dL — ABNORMAL HIGH (ref 8–23)
CO2: 23 mmol/L (ref 22–32)
Calcium: 8.4 mg/dL — ABNORMAL LOW (ref 8.9–10.3)
Chloride: 101 mmol/L (ref 98–111)
Creatinine, Ser: 1.57 mg/dL — ABNORMAL HIGH (ref 0.44–1.00)
GFR, Estimated: 33 mL/min — ABNORMAL LOW (ref >60)
Glucose, Bld: 99 mg/dL (ref 70–99)
Potassium: 4 mmol/L (ref 3.5–5.1)
Sodium: 135 mmol/L (ref 135–145)

## 2022-12-10 LAB — GLUCOSE, CAPILLARY
Glucose-Capillary: 100 mg/dL — ABNORMAL HIGH (ref 70–99)
Glucose-Capillary: 167 mg/dL — ABNORMAL HIGH (ref 70–99)
Glucose-Capillary: 110 mg/dL — ABNORMAL HIGH (ref 70–99)
Glucose-Capillary: 114 mg/dL — ABNORMAL HIGH (ref 70–99)

## 2022-12-10 LAB — MAGNESIUM: Magnesium: 2.1 mg/dL (ref 1.7–2.4)

## 2022-12-10 NOTE — Progress Notes (Signed)
Order to place foley per Dr. Myra Gianotti. Order completed.

## 2022-12-10 NOTE — Progress Notes (Addendum)
Mobility Specialist Progress Note:   12/10/22 1115  Mobility  Activity Transferred from chair to bed  Level of Assistance Maximum assist, patient does 25-49% (+3)  Activity Response Tolerated well  $Mobility charge 1 Mobility  Mobility Specialist Start Time (ACUTE ONLY) 1105  Mobility Specialist Stop Time (ACUTE ONLY) 1112  Mobility Specialist Time Calculation (min) (ACUTE ONLY) 7 min   Pt received in chair requesting to go back to bed. MaxA +3 to safely lateral scoot pt with bed pad from chair to bed. Asymptomatic throughout. Pt left in bed with nursing staff present in room.  Leory Plowman  Mobility Specialist Please contact via [employer] office at [phone]

## 2022-12-10 NOTE — Plan of Care (Signed)
Problem: Clinical Measurements: Goal: Will remain free from infection Outcome: Progressing   Problem: Nutrition: Goal: Adequate nutrition will be maintained Outcome: Progressing

## 2022-12-10 NOTE — Progress Notes (Signed)
Physical Therapy Treatment Patient Details Name: Heather Molina MRN: [id number] DOB: August 29, 1940 Today's Date: 12/10/2022   History of Present Illness 82 y.o. 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 PMHx: COPD on O2, CAD, HTN, A-fib, CVA, anxiety, IBS    PT Comments  Pt pleasant and reports lack of sleep but still willing to participate with therapy. Pt educated for utilizing UB to move LB and requested pants so pt can move limbs easier. Pt with improved ability to roll and rise but continues to require mod-max assist for transfers and balance. Will continue to follow and encouraged moving in <2hrs for pressure relief.     If plan is discharge home, recommend the following: Two people to help with walking and/or transfers;Help with stairs or ramp for entrance;Assist for transportation;A lot of help with bathing/dressing/bathroom;Assistance with cooking/housework   Can travel by Doctor, hospital (measurements PT);Hospital bed;Wheelchair cushion (measurements PT);Hoyer lift    Recommendations for Other Services       Precautions / Restrictions Precautions Precautions: Fall Precaution Comments: incomplete paraplegia     Mobility  Bed Mobility Overal bed mobility: Needs Assistance Bed Mobility: Rolling, Sidelying to Sit Rolling: Mod assist, Used rails Sidelying to sit: Mod assist       General bed mobility comments: pt cued to use pad under thigh to lift thigh for bending Rt knee with therapist moving LLE. pt able to reach for rail and roll upper body then utilize UB to move legs off bed with mod assist to elevate trunk to sitting.    Transfers Overall transfer level: Needs assistance   Transfers: Bed to  chair/wheelchair/BSC            Lateral/Scoot Transfers: Max assist, +2 physical assistance General transfer comment: max assist with +2 for safety for seqeuntial lateral scoot to drop arm chair with use of pad under sacrum. Max +2 to scoot back in chair    Ambulation/Gait               General Gait Details: unable   Stairs             Wheelchair Mobility     Tilt Bed    Modified Rankin (Stroke Patients Only)       Balance Overall balance assessment: Needs assistance Sitting-balance support: Feet unsupported, Bilateral upper extremity supported Sitting balance-Leahy Scale: Poor Sitting balance - Comments: mod assist for sitting balance with cues for midline and UB support on surface. With UB support pt able to maintain midline grossly 20 sec at a time with max assist to recover with LOB both posteriorly and anteriorly. Pt fatigues with back pain after 7 min EOB. Postural control: Posterior lean                                  Cognition Arousal: Alert Behavior During Therapy: WFL for tasks assessed/performed Overall Cognitive Status: Impaired/Different from baseline Area of Impairment: Safety/judgement                         Safety/Judgement: Decreased awareness of deficits     General Comments: decreased awareness of balance deficits and need  to correct        Exercises General Exercises - Lower Extremity Long Arc Quad: PROM, Both, Seated, 10 reps    General Comments        Pertinent Vitals/Pain Pain Assessment Pain Assessment: 0-10 Pain Score: 3  Pain Location: back Pain Descriptors / Indicators: Aching Pain Intervention(s): Monitored during session    Home Living                          Prior Function            PT Goals (current goals can now be found in the care plan section) Progress towards PT goals: Progressing toward goals    Frequency    Min 1X/week      PT Plan       Co-evaluation              AM-PAC PT "6 Clicks" Mobility   Outcome Measure  Help needed turning from your back to your side while in a flat bed without using bedrails?: A Lot Help needed moving from lying on your back to sitting on the side of a flat bed without using bedrails?: A Lot Help needed moving to and from a bed to a chair (including a wheelchair)?: Total Help needed standing up from a chair using your arms (e.g., wheelchair or bedside chair)?: Total Help needed to walk in hospital room?: Total Help needed climbing 3-5 steps with a railing? : Total 6 Click Score: 8    End of Session Equipment Utilized During Treatment: Oxygen Activity Tolerance: Patient tolerated treatment well Patient left: with chair alarm set;in chair;with call bell/phone within reach Nurse Communication: Mobility status PT Visit Diagnosis: Difficulty in walking, not elsewhere classified (R26.2);Other abnormalities of gait and mobility (R26.89);Other symptoms and signs involving the nervous system (R29.898)     Time: 5409-8119 PT Time Calculation (min) (ACUTE ONLY): 22 min  Charges:    $Therapeutic Activity: 8-22 mins PT General Charges $$ ACUTE PT VISIT: 1 Visit                     Merryl Hacker, PT Acute Rehabilitation Services Office: [phone]    [name] [name] 12/10/2022, 9:29 AM

## 2022-12-10 NOTE — Plan of Care (Signed)
  Problem: Clinical Measurements: Goal: Ability to maintain clinical measurements within normal limits will improve Outcome: Progressing Goal: Will remain free from infection Outcome: Progressing Goal: Diagnostic test results will improve Outcome: Progressing Goal: Respiratory complications will improve Outcome: Progressing Goal: Cardiovascular complication will be avoided Outcome: Progressing   Problem: Activity: Goal: Risk for activity intolerance will decrease Outcome: Not Progressing   Problem: Nutrition: Goal: Adequate nutrition will be maintained Outcome: Progressing   Problem: Elimination: Goal: Will not experience complications related to bowel motility Outcome: Progressing Goal: Will not experience complications related to urinary retention Outcome: Progressing   Problem: Pain Managment: Goal: General experience of comfort will improve Outcome: Progressing   Problem: Safety: Goal: Ability to remain free from injury will improve Outcome: Progressing   Problem: Skin Integrity: Goal: Risk for impaired skin integrity will decrease Outcome: Progressing   Problem: Respiratory: Goal: Ability to achieve and maintain a regular respiratory rate will improve Outcome: Progressing

## 2022-12-10 NOTE — Plan of Care (Signed)
  Problem: Pain Managment: Goal: General experience of comfort will improve Outcome: Progressing   Problem: Safety: Goal: Ability to remain free from injury will improve Outcome: Progressing   Problem: Skin Integrity: Goal: Risk for impaired skin integrity will decrease Outcome: Progressing   

## 2022-12-10 NOTE — Progress Notes (Signed)
  Progress Note    12/10/2022 7:57 AM 3 Days Post-Op  Subjective:  foley replaced yesterday due to retention   Vitals:   12/10/22 0330 12/10/22 0411  BP: (!) 185/57 128/66  Pulse:  66  Resp:  16  Temp: 99 F (37.2 C)   SpO2: 99% 99%   Physical Exam: Lungs:  non labored Incisions:  groin incisions healing well Extremities:  feet warm Neurologic: A&O; no motor BLE  CBC    Component Value Date/Time   WBC 7.9 12/10/2022 0305   RBC 2.80 (L) 12/10/2022 0305   HGB 7.5 (L) 12/10/2022 0305   HCT 23.9 (L) 12/10/2022 0305   PLT 164 12/10/2022 0305   MCV 85.4 12/10/2022 0305   MCH 26.8 12/10/2022 0305   MCHC 31.4 12/10/2022 0305   RDW 16.7 (H) 12/10/2022 0305   LYMPHSABS 0.5 (L) 12/07/2022 0040   MONOABS 1.2 (H) 12/07/2022 0040   EOSABS 0.1 12/07/2022 0040   BASOSABS 0.1 12/07/2022 0040    BMET    Component Value Date/Time   NA 135 12/10/2022 0305   K 4.0 12/10/2022 0305   CL 101 12/10/2022 0305   CO2 23 12/10/2022 0305   GLUCOSE 99 12/10/2022 0305   BUN 29 (H) 12/10/2022 0305   CREATININE 1.57 (H) 12/10/2022 0305   CALCIUM 8.4 (L) 12/10/2022 0305   GFRNONAA 33 (L) 12/10/2022 0305   GFRAA >60 05/14/2019 0414    INR    Component Value Date/Time   INR 1.4 (H) 12/07/2022 0949     Intake/Output Summary (Last 24 hours) at 12/10/2022 0757 Last data filed at 12/10/2022 1610 Gross per 24 hour  Intake 480 ml  Output 1500 ml  Net -1020 ml     Assessment/Plan:  82 y.o. female is s/p TEVAR for IMH 3 Days Post-Op   Groin incisions healing well Minimal to absent motor BLE Foley replaced due to retention; will have her follow up with urology as an outpatient CIR when approved   Heather Rutter, PA-C [facility] [phone] 12/10/2022 7:57 AM

## 2022-12-11 ENCOUNTER — Other Ambulatory Visit: Payer: Self-pay

## 2022-12-11 DIAGNOSIS — I7100 Dissection of unspecified site of aorta: Secondary | ICD-10-CM

## 2022-12-11 LAB — BPAM RBC
Blood Product Expiration Date: 202411052359
Blood Product Expiration Date: 202411052359
ISSUE DATE / TIME: 202410140216
ISSUE DATE / TIME: 202410140216
Unit Type and Rh: 5100
Unit Type and Rh: 5100

## 2022-12-11 LAB — TYPE AND SCREEN
ABO/RH(D): O POS
Antibody Screen: NEGATIVE
Unit division: 0
Unit division: 0

## 2022-12-11 LAB — CBC
HCT: 23.5 % — ABNORMAL LOW (ref 36.0–46.0)
Hemoglobin: 7.3 g/dL — ABNORMAL LOW (ref 12.0–15.0)
MCH: 26.4 pg (ref 26.0–34.0)
MCHC: 31.1 g/dL (ref 30.0–36.0)
MCV: 84.8 fL (ref 80.0–100.0)
Platelets: 154 10*3/uL (ref 150–400)
RBC: 2.77 MIL/uL — ABNORMAL LOW (ref 3.87–5.11)
RDW: 16.8 % — ABNORMAL HIGH (ref 11.5–15.5)
WBC: 7 10*3/uL (ref 4.0–10.5)
nRBC: 0 % (ref 0.0–0.2)

## 2022-12-11 LAB — BASIC METABOLIC PANEL
Anion gap: 8 (ref 5–15)
BUN: 30 mg/dL — ABNORMAL HIGH (ref 8–23)
CO2: 24 mmol/L (ref 22–32)
Calcium: 8 mg/dL — ABNORMAL LOW (ref 8.9–10.3)
Chloride: 100 mmol/L (ref 98–111)
Creatinine, Ser: 1.4 mg/dL — ABNORMAL HIGH (ref 0.44–1.00)
GFR, Estimated: 37 mL/min — ABNORMAL LOW (ref >60)
Glucose, Bld: 100 mg/dL — ABNORMAL HIGH (ref 70–99)
Potassium: 4.2 mmol/L (ref 3.5–5.1)
Sodium: 132 mmol/L — ABNORMAL LOW (ref 135–145)

## 2022-12-11 LAB — GLUCOSE, CAPILLARY
Glucose-Capillary: 103 mg/dL — ABNORMAL HIGH (ref 70–99)
Glucose-Capillary: 110 mg/dL — ABNORMAL HIGH (ref 70–99)
Glucose-Capillary: 118 mg/dL — ABNORMAL HIGH (ref 70–99)
Glucose-Capillary: 119 mg/dL — ABNORMAL HIGH (ref 70–99)

## 2022-12-11 LAB — MAGNESIUM: Magnesium: 2.1 mg/dL (ref 1.7–2.4)

## 2022-12-11 NOTE — Progress Notes (Signed)
Occupational Therapy Treatment Patient Details Name: Heather Molina MRN: [id number] DOB: [birth year] Today's Date: 12/11/2022   History of present illness 82 y.o. 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 OT focused on ADLs and use of AE for increased independence with bed mobility. Pt currently demonstrates ability to complete self-feeding and grooming in sitting with back supported Independent to Set up and UB dressing from bed level with Min assist. Pt verbalized and demonstrated understanding of education regarding using gait belt as leg lifter to bring B LE to EOB in preparation for functional transfer. Pt participated well in session and is making progress toward goals. Pt will benefit from continued acute skilled OT services to address deficits outlined below and increase safety and independence with functional tasks. Post acute discharge, pt will benefit from intensive inpatient skilled rehab services > 3 hours per day to maximize rehab potential.       If plan is discharge home, recommend the following:  Two people to help with walking and/or transfers;A lot of help with bathing/dressing/bathroom;Help with stairs or ramp for entrance;Assist for transportation   Equipment Recommendations  Other (comment) (defer to next level of care)    Recommendations for Other Services Speech consult;Rehab consult (pt reporting food feels like it is "getting stuck" when eating)    Precautions / Restrictions Precautions Precautions: Fall Precaution Comments: incomplete paraplegia Restrictions Weight Bearing Restrictions: No        Mobility Bed Mobility Overal bed mobility: Needs Assistance Bed Mobility: Supine to Sit     Supine to sit: Mod assist, HOB elevated, Used rails     General bed mobility comments: Pt able to use BUE to minimally advance BLE towards EOB. OT instructed pt in using gait belt as leg lifter during bed mobility with pt demonstrating improved advancement using gait belt with set up assist for BLE and pt reporting less strain on UB with use of gait belt. Pt able to pull hips towards EOB using bedrail. intermittent mod A for BLE management and trunk elevation. Dense cues for technique. Pt requires frequent rest breaks due to fatigue    Transfers Overall transfer level: Needs assistance Equipment used:  (bedpad) Transfers: Bed to chair/wheelchair/BSC            Lateral/Scoot Transfers: Max assist, +2 physical assistance General transfer comment: Pt able to scoot forward at EOB with min A. max A +2 with bedpad to scoot to recliner. Pt able to elevate bottom some with BUE, but requires assist to advance towards chair.     Balance Overall balance assessment: Needs assistance Sitting-balance support: Feet unsupported, Bilateral upper extremity supported Sitting balance-Leahy Scale: Poor Sitting balance - Comments: Pt requires CGA with intermittent mod A for balance with increased fatigue. Pt requires BUE support to maintain balance, however demonstrates slightly improved core engagement this session to correct leans. Postural control: Posterior lean                                 ADL either performed or assessed with clinical judgement   ADL Overall ADL's :  Needs assistance/impaired Eating/Feeding: Independent;Sitting   Grooming: Set up;Sitting           Upper Body Dressing : Minimal assistance;Bed level                     General ADL Comments: Pt with decreased activity tolerance and fatiguing quickly with ADLs and bed mobility.    Extremity/Trunk  Assessment Upper Extremity Assessment Upper Extremity Assessment: Right hand dominant;Generalized weakness   Lower Extremity Assessment Lower Extremity Assessment: Defer to PT evaluation        Vision       Perception     Praxis      Cognition Arousal: Alert Behavior During Therapy: WFL for tasks assessed/performed Overall Cognitive Status: Impaired/Different from baseline Area of Impairment: Safety/judgement, Problem solving, Memory                     Memory: Decreased short-term memory     Awareness: Emergent Problem Solving: Slow processing, Requires verbal cues General Comments: AAOx4 and pleasant throughout session. Demonstrates ability to follow 1 and 2-step instructions consistently.        Exercises      Shoulder Instructions       General Comments VSS on 2L continuous O2 through nasal cannula throughout session. Pt's daughter present during a portion of session.    Pertinent Vitals/ Pain       Pain Assessment Pain Assessment: Faces Faces Pain Scale: Hurts a little bit Pain Location: stomach and back when eating, feeling like food is "getting stuck" Pain Descriptors / Indicators: Aching Pain Intervention(s): Monitored during session  Home Living                                          Prior Functioning/Environment              Frequency  Min 1X/week        Progress Toward Goals  OT Goals(current goals can now be found in the care plan section)  Progress towards OT goals: Progressing toward goals  Acute Rehab OT Goals Patient Stated Goal: to get stronger, be able to use her legs, and be as independent as possible  Plan      Co-evaluation    PT/OT/SLP Co-Evaluation/Treatment: Yes Reason for Co-Treatment: Complexity of the patient's impairments (multi-system involvement);To address functional/ADL transfers PT goals addressed during session: Mobility/safety with mobility;Balance OT goals addressed during  session: Proper use of Adaptive equipment and DME;ADL's and self-care      AM-PAC OT "6 Clicks" Daily Activity     Outcome Measure   Help from another person eating meals?: None Help from another person taking care of personal grooming?: A Little Help from another person toileting, which includes using toliet, bedpan, or urinal?: Total Help from another person bathing (including washing, rinsing, drying)?: A Lot Help from another person to put on and taking off regular upper body clothing?: A Little Help from another person to put on and taking off regular lower body clothing?: A Lot 6 Click Score: 15    End of Session Equipment Utilized During Treatment: Oxygen;Gait belt  OT Visit Diagnosis: Unsteadiness on feet (R26.81);Muscle weakness (generalized) (M62.81);Other abnormalities of gait and mobility (R26.89);History of falling (Z91.81)   Activity Tolerance Patient tolerated treatment well   Patient Left in chair;with call bell/phone within reach;with family/visitor present (with lift pad in place  under pt)   Nurse Communication Mobility status        Time: 2956-2130 OT Time Calculation (min): 37 min  Charges: OT General Charges $OT Visit: 1 Visit OT Treatments $Self Care/Home Management : 8-22 mins  Ashland Wiseman "Orson Eva., OTR/L, MA Acute Rehab [phone]   Lendon Colonel 12/11/2022, 5:54 PM

## 2022-12-11 NOTE — Progress Notes (Signed)
Physical Therapy Treatment Patient Details Name: Heather Molina MRN: [id number] DOB: [birth year] Today's Date: 12/11/2022   History of Present Illness 82 y.o. 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 Pt demonstrates improved ability to manage BLE to sit to EOB with cues for technique and use of gait belt. Pt demonstrates slightly improved sitting balance and tolerance, however continues to fatigue quickly requiring intermittent assist. Pt is able to scoot R hip forward at EOB with min A. Pt continues to require max A +2 to lateral scoot to recliner due to BUE weakness and impaired balance. Pt continues to benefit from PT services to progress toward functional mobility goals.    If plan is discharge home, recommend the following: Two people to help with walking and/or transfers;Help with stairs or ramp for entrance;Assist for transportation;A lot of help with bathing/dressing/bathroom;Assistance with cooking/housework   Can travel by Doctor, hospital (measurements PT);Hospital bed;Wheelchair cushion (measurements PT);Hoyer lift    Recommendations for [employer]       Precautions / Restrictions Precautions Precautions: Fall Precaution Comments: incomplete paraplegia Restrictions Weight Bearing Restrictions: No     Mobility  Bed Mobility Overal bed mobility: Needs Assistance Bed Mobility: Supine to Sit     Supine to sit: Mod assist, HOB elevated, Used rails     General bed mobility comments: Pt able to use BUE to minimally advance BLE towards EOB. Pt with improved advancement using gait belt with set up  assist for BLE. Pt able to pull hips towards EOB using bedrail. intermittent mod A for BLE management and trunk elevation. Dense cues for technique. Pt requires frequent rest breaks due to fatigue    Transfers Overall transfer level: Needs assistance Equipment used:  (bedpad) Transfers: Bed to chair/wheelchair/BSC            Lateral/Scoot Transfers: Max assist, +2 physical assistance General transfer comment: Pt able to scoot forward at EOB with min A. max A +2 with bedpad to scoot to recliner. Pt able to elevate bottom some with BUE, but requires assist to advance towards chair.       Balance Overall balance assessment: Needs assistance Sitting-balance support: Feet unsupported, Bilateral upper extremity supported Sitting balance-Leahy Scale: Poor Sitting balance - Comments: Pt requires CGA with intermittent mod A for balance with increased fatigue. Pt requires BUE support to maintain balance, however demonstrates slightly improved core engagement this session to correct leans.                                    Cognition Arousal: Alert Behavior During Therapy: WFL for tasks assessed/performed Overall Cognitive Status: Impaired/Different from baseline                                          Exercises      General Comments        Pertinent Vitals/Pain Pain Assessment Pain Assessment: Faces Faces Pain Scale: Hurts a  little bit Pain Location: stomach and back when eating Pain Descriptors / Indicators: Aching Pain Intervention(s): Monitored during session     PT Goals (current goals can now be found in the care plan section) Acute Rehab PT Goals Patient Stated Goal: get my legs working again PT Goal Formulation: With patient Time For Goal Achievement: 12/22/22 Progress towards PT goals: Progressing toward goals    Frequency    Min 1X/week           Co-evaluation PT/OT/SLP Co-Evaluation/Treatment: Yes Reason for  Co-Treatment: Complexity of the patient's impairments (multi-system involvement);To address functional/ADL transfers PT goals addressed during session: Mobility/safety with mobility;Balance        AM-PAC PT "6 Clicks" Mobility   Outcome Measure  Help needed turning from your back to your side while in a flat bed without using bedrails?: A Little Help needed moving from lying on your back to sitting on the side of a flat bed without using bedrails?: A Lot Help needed moving to and from a bed to a chair (including a wheelchair)?: Total Help needed standing up from a chair using your arms (e.g., wheelchair or bedside chair)?: Total Help needed to walk in hospital room?: Total Help needed climbing 3-5 steps with a railing? : Total 6 Click Score: 9    End of Session Equipment Utilized During Treatment: Oxygen Activity Tolerance: Patient tolerated treatment well Patient left: with chair alarm set;in chair;with call bell/phone within reach;with family/visitor present Nurse Communication: Mobility status PT Visit Diagnosis: Difficulty in walking, not elsewhere classified (R26.2);Other abnormalities of gait and mobility (R26.89);Other symptoms and signs involving the nervous system (R29.898)     Time: 1610-9604 PT Time Calculation (min) (ACUTE ONLY): 37 min  Charges:    $Therapeutic Activity: 8-22 mins PT General Charges $$ ACUTE PT VISIT: 1 Visit                    Johny Shock, PTA Acute Rehabilitation Services Secure Chat Preferred  [phone]    Johny Shock 12/11/2022, 4:00 PM

## 2022-12-11 NOTE — Progress Notes (Addendum)
  Progress Note    12/11/2022 7:35 AM 4 Days Post-Op  Subjective:  awaiting rehab   Vitals:   12/10/22 2312 12/11/22 0453  BP: (!) 161/55 (!) 161/49  Pulse: 64 63  Resp: 15 14  Temp: 98.1 F (36.7 C) 97.9 F (36.6 C)  SpO2: 97% 100%   Physical Exam: Lungs:  non labored Incisions:  groins healing well Extremities:  palpable DP pulses Abdomen:  soft Neurologic: A&O; no motor BLE  CBC    Component Value Date/Time   WBC 7.0 12/11/2022 0515   RBC 2.77 (L) 12/11/2022 0515   HGB 7.3 (L) 12/11/2022 0515   HCT 23.5 (L) 12/11/2022 0515   PLT 154 12/11/2022 0515   MCV 84.8 12/11/2022 0515   MCH 26.4 12/11/2022 0515   MCHC 31.1 12/11/2022 0515   RDW 16.8 (H) 12/11/2022 0515   LYMPHSABS 0.5 (L) 12/07/2022 0040   MONOABS 1.2 (H) 12/07/2022 0040   EOSABS 0.1 12/07/2022 0040   BASOSABS 0.1 12/07/2022 0040    BMET    Component Value Date/Time   NA 132 (L) 12/11/2022 0515   K 4.2 12/11/2022 0515   CL 100 12/11/2022 0515   CO2 24 12/11/2022 0515   GLUCOSE 100 (H) 12/11/2022 0515   BUN 30 (H) 12/11/2022 0515   CREATININE 1.40 (H) 12/11/2022 0515   CALCIUM 8.0 (L) 12/11/2022 0515   GFRNONAA 37 (L) 12/11/2022 0515   GFRAA >60 05/14/2019 0414    INR    Component Value Date/Time   INR 1.4 (H) 12/07/2022 0949     Intake/Output Summary (Last 24 hours) at 12/11/2022 0735 Last data filed at 12/11/2022 0453 Gross per 24 hour  Intake 360 ml  Output 450 ml  Net -90 ml     Assessment/Plan:  82 y.o. female is s/p TEVAR for IMH 4 Days Post-Op   Groin incisions healing well No improvement in motor function BLE Foley replaced due to retention; can follow with urology as an outpatient Ok to resume Eliquis for a fib from vascular standpoint CIR when approved   Emilie Rutter, PA-C [facility] [phone] 12/11/2022 7:35 AM   VASCULAR STAFF ADDENDUM: I have independently interviewed and examined the patient. I agree with the above.  Rehab  admission planned for this weekend. Follow up sent for 44month with CTA chest  Daria Pastures MD [facility] Phone Number: [phone] 12/11/2022 1:00 PM

## 2022-12-11 NOTE — Progress Notes (Signed)
IP rehab admissions - I anticipate having a bed available for this patient tomorrow, Saturday, on inpatient rehab.  I have notified PA today of possible admit tomorrow.  I will plan to admit tomorrow, Saturday.  Please have nurse call the rehab unit around 12 noon to verify bed availability, check room number and to give report to the receiving nurse on rehab at [phone].  Call me for any questions.  [phone]

## 2022-12-11 NOTE — Plan of Care (Signed)
CHL Tonsillectomy/Adenoidectomy, Postoperative PEDS care plan entered in error.

## 2022-12-11 NOTE — H&P (Incomplete)
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  She was started on esmolol and transferred to the [facility] campus.  Vascular surgery consulted and operative consent obtained for TEVAR. She was taken to the operating room by Dr. Hetty Blend and underwent TEVAR with coverage of LSA, right femoral endarterectomy and patch angioplasty.  She was extubated and transferred to the intensive care unit for observation.  Critical care management consulted for ICU management.  On postop day 1, she was unable to move her feet. Discussion by Dr. Hetty Blend with patient and family regarding spinal cord ischemia.  She was told of high likelihood of long-term paralysis.  It was explained to her that pressure elevation and lumbar drain placement was the only treatment to maximize recovery potential.  The patient deferred undergoing any further procedures.  Blood pressure control with MAP goal greater than 100 for 48 hours continued.  She required replacement of Foley catheter on 10/16 due to retention.  PT and OT evaluations obtained.  She remains on heparin subcutaneously for DVT prophylaxis.  She has been cleared to restart Eliquis by vascular surgery as of 10/18.  Heart rate controlled on Lopressor and Pacerone.  History of COPD and continues with nebulizer treatments.  She is oxygen dependent and maintaining saturations on 2 L via nasal cannula.  Appears to have had acute kidney injury and unknown history of chronic kidney disease.  Serum creatinine  continues to improve.  She has had 450 cc of urine output last 24 hours 10/17-10/18. She is tolerating regular diet. The patient requires inpatient medicine and rehabilitation evaluations and services for ongoing dysfunction secondary to spinal card ischemia.  The patient lives with her daughter, [name], who is retired. Present at bedside. Patient currently complaining of back pain due to be in recliner for almost two hours. She indicates she cannot tell when she needs to have a BM. Daughter indicates depressed mood. (Is on paroxetine for history of depression)  ROS Past Medical History:  Diagnosis Date   Acute foot pain, right 04/15/2022   Acute on chronic respiratory failure with hypoxia (HCC) 08/30/2022   Age-related osteoporosis without current pathological fracture 04/06/2018   ALLERGIC RHINITIS 05/20/2009   Qualifier: Diagnosis of   By: Excell Seltzer CMA, Lawson Fiscal      Replacing diagnoses that were inactivated after the 05/25/22 regulatory import   Anxiety    Arthritis, multiple joint involvement 04/06/2018   C O P D 05/20/2009   Qualifier: Diagnosis of   By: Excell Seltzer CMA, Lori      Replacing diagnoses that were inactivated after the 05/25/22 regulatory import   CAD (coronary artery disease) 06/29/2022   Calculus of gallbladder with chronic cholecystitis without obstruction 04/30/2016   CAP (community acquired pneumonia) 05/13/2019   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/26/2020   Cigarette nicotine dependence without complication 12/26/2020   COPD with acute exacerbation (HCC) 08/30/2022   Elevated brain natriuretic peptide (BNP) level 08/30/2022   G E R D 05/20/2009   Qualifier: Diagnosis of   By: Excell Seltzer CMA, Lawson Fiscal  Generalized edema 08/08/2019   History of CVA (cerebrovascular accident) 12/26/2020   Hypertension    Hypoxia 05/13/2019   Irritable bowel syndrome with diarrhea 09/27/2019   Leukocytosis 05/13/2019   Lipoma of torso 04/07/2019   Mixed dyslipidemia  04/06/2018   NSVT (nonsustained ventricular tachycardia) (HCC) 05/13/2019   Paroxysmal atrial fibrillation (HCC) 06/29/2022   Prolonged Q-T interval on ECG 05/13/2019   Recurrent major depressive disorder, in full remission (HCC) 04/06/2018   Respiratory infection 04/15/2022   Right inguinal hernia 03/02/2018   Sebaceous cyst 04/07/2019   TOBACCO ABUSE 05/21/2009   Qualifier: Diagnosis of   By: Craige Cotta MD, Vineet       Past Surgical History:  Procedure Laterality Date   CHOLECYSTECTOMY     ENDARTERECTOMY FEMORAL Right 12/07/2022   Procedure: RIGHT FEMORAL ENDARTERECTOMY;  Surgeon: Daria Pastures, MD;  Location: [facility] OR;  Service: Vascular;  Laterality: Right;   HERNIA REPAIR     PATCH ANGIOPLASTY Right 12/07/2022   Procedure: RIGHT FEMORAL PATCH ANGIOPLASTY USING BOVINE PATCH;  Surgeon: Daria Pastures, MD;  Location: [facility] OR;  Service: Vascular;  Laterality: Right;   THORACIC AORTIC ENDOVASCULAR STENT GRAFT N/A 12/07/2022   Procedure: TEVAR;  Surgeon: Daria Pastures, MD;  Location: [facility] OR;  Service: Vascular;  Laterality: N/A;   ULTRASOUND GUIDANCE FOR VASCULAR ACCESS  12/07/2022   Procedure: ULTRASOUND GUIDANCE FOR VASCULAR ACCESS;  Surgeon: Daria Pastures, MD;  Location: [facility] OR;  Service: Vascular;;   Family History  Problem Relation Age of Onset   Epilepsy Father    CVA Father    Social History:  reports that she has quit smoking. She has never used smokeless tobacco. She reports that she does not currently use alcohol. She reports that she does not currently use drugs. Allergies:  Allergies  Allergen Reactions   Ceftriaxone Hives, Shortness Of Breath, Swelling and Rash    Redness    Albuterol Other (See Comments)    HEART PALPATIONS AND JITTERY   Medications Prior to Admission  Medication Sig Dispense Refill   acetaminophen (TYLENOL) 500 MG tablet Take 500 mg by mouth as needed for moderate pain.     amiodarone (PACERONE) 200 MG tablet Take 0.5 tablets (100 mg total) by  mouth daily. 30 tablet 0   aspirin 81 MG EC tablet Take 81 mg by mouth daily.     BREZTRI AEROSPHERE 160-9-4.8 MCG/ACT AERO Inhale 2 puffs into the lungs in the morning and at bedtime.     cholecalciferol (VITAMIN D3) 25 MCG (1000 UNIT) tablet Take 1,000 Units by mouth daily.     diphenoxylate-atropine (LOMOTIL) 2.5-0.025 MG tablet Take 1 tablet by mouth as needed for diarrhea or loose stools.     ELIQUIS 2.5 MG TABS tablet Take 2.5 mg by mouth 2 (two) times daily.     furosemide (LASIX) 20 MG tablet Take 1 tablet by mouth daily.     levalbuterol (XOPENEX HFA) 45 MCG/ACT inhaler Inhale into the lungs.     levalbuterol (XOPENEX) 1.25 MG/0.5ML nebulizer solution Take 1.25 mg by nebulization as needed for wheezing or shortness of breath.     mupirocin ointment (BACTROBAN) 2 % Apply 1 Application topically 3 (three) times daily.     PARoxetine (PAXIL) 20 MG tablet Take 20 mg by mouth daily.     pravastatin (PRAVACHOL) 40 MG tablet Take 40 mg by mouth daily.     sulfamethoxazole-trimethoprim (BACTRIM DS) 800-160 MG tablet Take 1 tablet by mouth 2 (two) times daily.  doxycycline (VIBRA-TABS) 100 MG tablet Take 1 tablet (100 mg total) by mouth every 12 (twelve) hours. (Patient not taking: Reported on 12/07/2022) 7 tablet 0   metoprolol tartrate (LOPRESSOR) 25 MG tablet Take 25 mg by mouth 2 (two) times daily. (Patient not taking: Reported on 12/07/2022)     tobramycin (TOBREX) 0.3 % ophthalmic solution Place 1-4 drops into the left eye See admin instructions. Instil 1 drop into the left eye the day before injection. Instill 2 drops  into the left eye at breakfast, lunch, and bedtime the day of injection. Instill 4 drops  into the left eye the morning after injection. (Patient not taking: Reported on 12/03/2022)        Home: Home Living Family/patient expects to be discharged to:: Private residence Living Arrangements: Children Available Help at Discharge: Family Type of Home: House Home Access:  Ramped entrance, Stairs to enter [employer] of Steps: 3 or 4 short steps Home Layout: One level Bathroom Shower/Tub: Health visitor: Standard Bathroom Accessibility: Yes Home Equipment: Information systems manager, Agricultural consultant (2 wheels), [employer] - single point  Lives With: Daughter   Functional History: Prior Function Prior Level of Function : Independent/Modified Independent Mobility Comments: started using RW a few days ago ADLs Comments: indep  Functional Status:  Mobility: Bed Mobility Overal bed mobility: Needs Assistance Bed Mobility: Rolling, Sidelying to Sit Rolling: Mod assist, Used rails Sidelying to sit: Mod assist Sit to supine: Max assist General bed mobility comments: pt cued to use pad under thigh to lift thigh for bending Rt knee with therapist moving LLE. pt able to reach for rail and roll upper body then utilize UB to move legs off bed with mod assist to elevate trunk to sitting. Transfers Overall transfer level: Needs assistance Equipment used:  (lateral scoot with bed pad) Transfers: Bed to chair/wheelchair/BSC Bed to/from chair/wheelchair/BSC transfer type:: Lateral/scoot transfer  Lateral/Scoot Transfers: Max assist, +2 physical assistance General transfer comment: max assist with +2 for safety for seqeuntial lateral scoot to drop arm chair with use of pad under sacrum. Max +2 to scoot back in chair Ambulation/Gait General Gait Details: unable    ADL: ADL Overall ADL's : Needs assistance/impaired Eating/Feeding: Bed level, Independent Grooming: Oral care, Moderate assistance, Maximal assistance, Sitting Grooming Details (indicate cue type and reason): for sitting balance. Upper Body Bathing: Sitting, Moderate assistance Lower Body Bathing: Total assistance Upper Body Dressing : Maximal assistance, Sitting Lower Body Dressing: Total assistance, Sitting/lateral leans Toilet Transfer: Maximal assistance, +2 for physical assistance, +2 for  safety/equipment Toilet Transfer Details (indicate cue type and reason): lateral scoot simulated to chair Functional mobility during ADLs: Maximal assistance, +2 for physical assistance, +2 for safety/equipment  Cognition: Cognition Overall Cognitive Status: Impaired/Different from baseline Orientation Level: Oriented X4 Cognition Arousal: Alert Behavior During Therapy: WFL for tasks assessed/performed Overall Cognitive Status: Impaired/Different from baseline Area of Impairment: Safety/judgement Safety/Judgement: Decreased awareness of deficits General Comments: decreased awareness of balance deficits and need to correct  Physical Exam: Blood pressure (!) 137/53, pulse 66, temperature 98.2 F (36.8 C), temperature source Oral, resp. rate 18, height 5' (1.524 m), weight 50.6 kg, SpO2 100%. Physical Exam Constitutional:      General: She is not in acute distress. HENT:     Head: Normocephalic and atraumatic.  Cardiovascular:     Rate and Rhythm: Normal rate and regular rhythm.  Pulmonary:     Effort: Pulmonary effort is normal.     Breath sounds: Normal breath sounds.  Abdominal:  General: Bowel sounds are normal.     Palpations: Abdomen is soft.  Neurological:     Mental Status: She is alert.  Psychiatric:        Mood and Affect: Mood normal.        Behavior: Behavior normal.     Results for orders placed or performed during the hospital encounter of 12/06/22 (from the past 48 hour(s))  Glucose, capillary     Status: None   Collection Time: 12/09/22 12:01 PM  Result Value Ref Range   Glucose-Capillary 86 70 - 99 mg/dL    Comment: Glucose reference range applies only to samples taken after fasting for at least 8 hours.  Glucose, capillary     Status: Abnormal   Collection Time: 12/09/22  3:21 PM  Result Value Ref Range   Glucose-Capillary 123 (H) 70 - 99 mg/dL    Comment: Glucose reference range applies only to samples taken after fasting for at least 8 hours.   Glucose, capillary     Status: Abnormal   Collection Time: 12/09/22 10:19 PM  Result Value Ref Range   Glucose-Capillary 106 (H) 70 - 99 mg/dL    Comment: Glucose reference range applies only to samples taken after fasting for at least 8 hours.   Comment 1 Notify RN   Basic metabolic panel     Status: Abnormal   Collection Time: 12/10/22  3:05 AM  Result Value Ref Range   Sodium 135 135 - 145 mmol/L   Potassium 4.0 3.5 - 5.1 mmol/L   Chloride 101 98 - 111 mmol/L   CO2 23 22 - 32 mmol/L   Glucose, Bld 99 70 - 99 mg/dL    Comment: Glucose reference range applies only to samples taken after fasting for at least 8 hours.   BUN 29 (H) 8 - 23 mg/dL   Creatinine, Ser 9.81 (H) 0.44 - 1.00 mg/dL   Calcium 8.4 (L) 8.9 - 10.3 mg/dL   GFR, Estimated 33 (L) >60 mL/min    Comment: (NOTE) Calculated using the CKD-EPI Creatinine Equation (2021)    Anion gap 11 5 - 15    Comment: Performed at [facility], [address] [place], [place] [address]  CBC     Status: Abnormal   Collection Time: 12/10/22  3:05 AM  Result Value Ref Range   WBC 7.9 4.0 - 10.5 K/uL   RBC 2.80 (L) 3.87 - 5.11 MIL/uL   Hemoglobin 7.5 (L) 12.0 - 15.0 g/dL   HCT 82.9 (L) 56.2 - 13.0 %   MCV 85.4 80.0 - 100.0 fL   MCH 26.8 26.0 - 34.0 pg   MCHC 31.4 30.0 - 36.0 g/dL   RDW 86.5 (H) 78.4 - 69.6 %   Platelets 164 150 - 400 K/uL   nRBC 0.0 0.0 - 0.2 %    Comment: Performed at [facility], [address] [place], [place] [address]  Magnesium     Status: None   Collection Time: 12/10/22  3:05 AM  Result Value Ref Range   Magnesium 2.1 1.7 - 2.4 mg/dL    Comment: Performed at [facility], [address], [place], [place] [address]  Glucose, capillary     Status: Abnormal   Collection Time: 12/10/22  5:51 AM  Result Value Ref Range   Glucose-Capillary 100 (H) 70 - 99 mg/dL    Comment: Glucose reference range applies only to samples taken after fasting for at least 8 hours.  Glucose, capillary      Status: Abnormal   Collection Time: 12/10/22 11:00 AM  Result Value Ref Range   Glucose-Capillary 167 (H) 70 - 99 mg/dL    Comment: Glucose reference range applies only to samples taken after fasting for at least 8 hours.  Glucose, capillary     Status: Abnormal   Collection Time: 12/10/22  3:53 PM  Result Value Ref Range   Glucose-Capillary 110 (H) 70 - 99 mg/dL    Comment: Glucose reference range applies only to samples taken after fasting for at least 8 hours.  Glucose, capillary     Status: Abnormal   Collection Time: 12/10/22  9:11 PM  Result Value Ref Range   Glucose-Capillary 114 (H) 70 - 99 mg/dL    Comment: Glucose reference range applies only to samples taken after fasting for at least 8 hours.  Basic metabolic panel     Status: Abnormal   Collection Time: 12/11/22  5:15 AM  Result Value Ref Range   Sodium 132 (L) 135 - 145 mmol/L   Potassium 4.2 3.5 - 5.1 mmol/L   Chloride 100 98 - 111 mmol/L   CO2 24 22 - 32 mmol/L   Glucose, Bld 100 (H) 70 - 99 mg/dL    Comment: Glucose reference range applies only to samples taken after fasting for at least 8 hours.   BUN 30 (H) 8 - 23 mg/dL   Creatinine, Ser 0.98 (H) 0.44 - 1.00 mg/dL   Calcium 8.0 (L) 8.9 - 10.3 mg/dL   GFR, Estimated 37 (L) >60 mL/min    Comment: (NOTE) Calculated using the CKD-EPI Creatinine Equation (2021)    Anion gap 8 5 - 15    Comment: Performed at [facility], [address], [place], [place] [address]  CBC     Status: Abnormal   Collection Time: 12/11/22  5:15 AM  Result Value Ref Range   WBC 7.0 4.0 - 10.5 K/uL   RBC 2.77 (L) 3.87 - 5.11 MIL/uL   Hemoglobin 7.3 (L) 12.0 - 15.0 g/dL   HCT 78.2 (L) 95.6 - 21.3 %   MCV 84.8 80.0 - 100.0 fL   MCH 26.4 26.0 - 34.0 pg   MCHC 31.1 30.0 - 36.0 g/dL   RDW 08.6 (H) 57.8 - 46.9 %   Platelets 154 150 - 400 K/uL   nRBC 0.0 0.0 - 0.2 %    Comment: Performed at [facility], [address] [place], [place] [address]  Magnesium     Status: None    Collection Time: 12/11/22  5:15 AM  Result Value Ref Range   Magnesium 2.1 1.7 - 2.4 mg/dL    Comment: Performed at [facility], [address] [place], [place] [address]  Glucose, capillary     Status: Abnormal   Collection Time: 12/11/22  6:12 AM  Result Value Ref Range   Glucose-Capillary 103 (H) 70 - 99 mg/dL    Comment: Glucose reference range applies only to samples taken after fasting for at least 8 hours.   No results found.    Blood pressure (!) 137/53, pulse 66, temperature 98.2 F (36.8 C), temperature source Oral, resp. rate 18, height 5' (1.524 m), weight 50.6 kg, SpO2 100%.  Medical Problem List and Plan: 1. Functional deficits secondary to ***  -patient may *** shower  -ELOS/Goals: ***  2.  Antithrombotics: -DVT/anticoagulation:  Pharmaceutical: Heparin  -antiplatelet therapy: Aspirin 81 mg daily  3. Pain Management:  Tylenol, oxycodone as needed  4. Mood/Behavior/Sleep: LCSW to evaluate and provide emotional support  -continue paroxitine 20 mg daily (hx of depression)  -antipsychotic agents: n/a  5. Neuropsych/cognition: This patient is capable of making decisions on her own behalf.  6. Skin/Wound Care: Routine skin care checks   7. Fluids/Electrolytes/Nutrition: Routine Is and Os and follow-up chemistries  8: Hypertension: monitor TID and prn (hx of a.fib see meds below)   9: Hyperlipidemia: continue statin  10: COPD, O2 dependent 2L via [place]:  -continue Brovana nebs BID  -continue Yupelri neb daily  11: CAD: stable on asa and statin  12: Atrial fibrillation, paroxysmal: maintained on Eliquis reversed pre-operatively  -continue amiodarone 100 mg daily  -continue metoprolol tartrate 25 mg BID  -cleared by [facility] to re-start Eliquis>pharmacy consult placed  13: 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  14: 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

## 2022-12-11 NOTE — Care Management Important Message (Signed)
Important Message  Patient Details  Name: TAMERIA GIVANS MRN: [id number] Date of Birth: 1940/04/27   Important Message Given:  Yes - Medicare IM     Sherilyn Banker 12/11/2022, 2:02 PM

## 2022-12-11 NOTE — Plan of Care (Signed)
  Problem: Clinical Measurements: Goal: Will remain free from infection Outcome: Progressing   Problem: Activity: Goal: Risk for activity intolerance will decrease Outcome: Not Progressing

## 2022-12-12 ENCOUNTER — Other Ambulatory Visit: Payer: Self-pay

## 2022-12-12 ENCOUNTER — Encounter (HOSPITAL_COMMUNITY): Payer: Self-pay | Admitting: Physical Medicine and Rehabilitation

## 2022-12-12 ENCOUNTER — Inpatient Hospital Stay (HOSPITAL_COMMUNITY)
Admission: AD | Admit: 2022-12-12 | Discharge: 2023-01-12 | DRG: 052 | Disposition: A | Discharge status: Home Health | Payer: Medicare Other | Source: Intra-hospital | Attending: Physical Medicine and Rehabilitation | Admitting: Physical Medicine and Rehabilitation

## 2022-12-12 DIAGNOSIS — I4891 Unspecified atrial fibrillation: Secondary | ICD-10-CM

## 2022-12-12 DIAGNOSIS — G09 Sequelae of inflammatory diseases of central nervous system: Secondary | ICD-10-CM | POA: Diagnosis present

## 2022-12-12 DIAGNOSIS — R54 Age-related physical debility: Secondary | ICD-10-CM | POA: Diagnosis present

## 2022-12-12 DIAGNOSIS — Z9981 Dependence on supplemental oxygen: Secondary | ICD-10-CM | POA: Diagnosis not present

## 2022-12-12 DIAGNOSIS — K580 Irritable bowel syndrome with diarrhea: Secondary | ICD-10-CM | POA: Diagnosis present

## 2022-12-12 DIAGNOSIS — R5383 Other fatigue: Secondary | ICD-10-CM | POA: Diagnosis not present

## 2022-12-12 DIAGNOSIS — I10 Essential (primary) hypertension: Secondary | ICD-10-CM | POA: Diagnosis present

## 2022-12-12 DIAGNOSIS — K31811 Angiodysplasia of stomach and duodenum with bleeding: Secondary | ICD-10-CM | POA: Diagnosis not present

## 2022-12-12 DIAGNOSIS — K259 Gastric ulcer, unspecified as acute or chronic, without hemorrhage or perforation: Secondary | ICD-10-CM | POA: Diagnosis present

## 2022-12-12 DIAGNOSIS — F331 Major depressive disorder, recurrent, moderate: Secondary | ICD-10-CM | POA: Diagnosis present

## 2022-12-12 DIAGNOSIS — G8220 Paraplegia, unspecified: Secondary | ICD-10-CM

## 2022-12-12 DIAGNOSIS — R001 Bradycardia, unspecified: Secondary | ICD-10-CM

## 2022-12-12 DIAGNOSIS — R9431 Abnormal electrocardiogram [ECG] [EKG]: Secondary | ICD-10-CM | POA: Diagnosis not present

## 2022-12-12 DIAGNOSIS — K2289 Other specified disease of esophagus: Secondary | ICD-10-CM | POA: Diagnosis not present

## 2022-12-12 DIAGNOSIS — Z79899 Other long term (current) drug therapy: Secondary | ICD-10-CM

## 2022-12-12 DIAGNOSIS — R338 Other retention of urine: Secondary | ICD-10-CM | POA: Diagnosis present

## 2022-12-12 DIAGNOSIS — L89622 Pressure ulcer of left heel, stage 2: Secondary | ICD-10-CM | POA: Diagnosis present

## 2022-12-12 DIAGNOSIS — K2210 Ulcer of esophagus without bleeding: Secondary | ICD-10-CM

## 2022-12-12 DIAGNOSIS — M79632 Pain in left forearm: Secondary | ICD-10-CM | POA: Diagnosis not present

## 2022-12-12 DIAGNOSIS — Q399 Congenital malformation of esophagus, unspecified: Secondary | ICD-10-CM | POA: Diagnosis not present

## 2022-12-12 DIAGNOSIS — L8990 Pressure ulcer of unspecified site, unspecified stage: Secondary | ICD-10-CM | POA: Insufficient documentation

## 2022-12-12 DIAGNOSIS — G8222 Paraplegia, incomplete: Principal | ICD-10-CM | POA: Diagnosis present

## 2022-12-12 DIAGNOSIS — J449 Chronic obstructive pulmonary disease, unspecified: Secondary | ICD-10-CM | POA: Diagnosis present

## 2022-12-12 DIAGNOSIS — Z7901 Long term (current) use of anticoagulants: Secondary | ICD-10-CM

## 2022-12-12 DIAGNOSIS — I2510 Atherosclerotic heart disease of native coronary artery without angina pectoris: Secondary | ICD-10-CM | POA: Diagnosis present

## 2022-12-12 DIAGNOSIS — E782 Mixed hyperlipidemia: Secondary | ICD-10-CM | POA: Diagnosis present

## 2022-12-12 DIAGNOSIS — I959 Hypotension, unspecified: Secondary | ICD-10-CM | POA: Diagnosis not present

## 2022-12-12 DIAGNOSIS — K921 Melena: Secondary | ICD-10-CM

## 2022-12-12 DIAGNOSIS — K219 Gastro-esophageal reflux disease without esophagitis: Secondary | ICD-10-CM | POA: Diagnosis present

## 2022-12-12 DIAGNOSIS — Z823 Family history of stroke: Secondary | ICD-10-CM

## 2022-12-12 DIAGNOSIS — Z7951 Long term (current) use of inhaled steroids: Secondary | ICD-10-CM

## 2022-12-12 DIAGNOSIS — R630 Anorexia: Secondary | ICD-10-CM | POA: Diagnosis present

## 2022-12-12 DIAGNOSIS — N179 Acute kidney failure, unspecified: Secondary | ICD-10-CM | POA: Diagnosis not present

## 2022-12-12 DIAGNOSIS — Z95820 Peripheral vascular angioplasty status with implants and grafts: Secondary | ICD-10-CM

## 2022-12-12 DIAGNOSIS — N390 Urinary tract infection, site not specified: Secondary | ICD-10-CM | POA: Diagnosis not present

## 2022-12-12 DIAGNOSIS — I480 Paroxysmal atrial fibrillation: Secondary | ICD-10-CM | POA: Diagnosis present

## 2022-12-12 DIAGNOSIS — D62 Acute posthemorrhagic anemia: Secondary | ICD-10-CM | POA: Diagnosis present

## 2022-12-12 DIAGNOSIS — Z8673 Personal history of transient ischemic attack (TIA), and cerebral infarction without residual deficits: Secondary | ICD-10-CM

## 2022-12-12 DIAGNOSIS — K2211 Ulcer of esophagus with bleeding: Secondary | ICD-10-CM | POA: Diagnosis not present

## 2022-12-12 DIAGNOSIS — R159 Full incontinence of feces: Secondary | ICD-10-CM | POA: Diagnosis not present

## 2022-12-12 DIAGNOSIS — J9611 Chronic respiratory failure with hypoxia: Secondary | ICD-10-CM | POA: Diagnosis present

## 2022-12-12 DIAGNOSIS — M810 Age-related osteoporosis without current pathological fracture: Secondary | ICD-10-CM | POA: Diagnosis present

## 2022-12-12 DIAGNOSIS — N319 Neuromuscular dysfunction of bladder, unspecified: Secondary | ICD-10-CM | POA: Diagnosis present

## 2022-12-12 DIAGNOSIS — Z7982 Long term (current) use of aspirin: Secondary | ICD-10-CM

## 2022-12-12 DIAGNOSIS — D500 Iron deficiency anemia secondary to blood loss (chronic): Secondary | ICD-10-CM | POA: Diagnosis not present

## 2022-12-12 DIAGNOSIS — I998 Other disorder of circulatory system: Secondary | ICD-10-CM | POA: Diagnosis not present

## 2022-12-12 DIAGNOSIS — D649 Anemia, unspecified: Secondary | ICD-10-CM | POA: Diagnosis not present

## 2022-12-12 DIAGNOSIS — L89612 Pressure ulcer of right heel, stage 2: Secondary | ICD-10-CM | POA: Diagnosis present

## 2022-12-12 DIAGNOSIS — Z820 Family history of epilepsy and other diseases of the nervous system: Secondary | ICD-10-CM

## 2022-12-12 DIAGNOSIS — K5900 Constipation, unspecified: Secondary | ICD-10-CM | POA: Diagnosis not present

## 2022-12-12 DIAGNOSIS — L89222 Pressure ulcer of left hip, stage 2: Secondary | ICD-10-CM | POA: Diagnosis present

## 2022-12-12 DIAGNOSIS — Z9049 Acquired absence of other specified parts of digestive tract: Secondary | ICD-10-CM

## 2022-12-12 DIAGNOSIS — I7143 Infrarenal abdominal aortic aneurysm, without rupture: Secondary | ICD-10-CM | POA: Diagnosis present

## 2022-12-12 DIAGNOSIS — K592 Neurogenic bowel, not elsewhere classified: Secondary | ICD-10-CM | POA: Diagnosis present

## 2022-12-12 DIAGNOSIS — G4700 Insomnia, unspecified: Secondary | ICD-10-CM | POA: Diagnosis not present

## 2022-12-12 DIAGNOSIS — Z881 Allergy status to other antibiotic agents status: Secondary | ICD-10-CM

## 2022-12-12 DIAGNOSIS — M25512 Pain in left shoulder: Secondary | ICD-10-CM | POA: Diagnosis not present

## 2022-12-12 DIAGNOSIS — Z888 Allergy status to other drugs, medicaments and biological substances status: Secondary | ICD-10-CM

## 2022-12-12 DIAGNOSIS — L89156 Pressure-induced deep tissue damage of sacral region: Secondary | ICD-10-CM | POA: Diagnosis present

## 2022-12-12 DIAGNOSIS — R0989 Other specified symptoms and signs involving the circulatory and respiratory systems: Secondary | ICD-10-CM | POA: Diagnosis not present

## 2022-12-12 DIAGNOSIS — Z87891 Personal history of nicotine dependence: Secondary | ICD-10-CM

## 2022-12-12 DIAGNOSIS — K449 Diaphragmatic hernia without obstruction or gangrene: Secondary | ICD-10-CM | POA: Diagnosis present

## 2022-12-12 DIAGNOSIS — G9511 Acute infarction of spinal cord (embolic) (nonembolic): Secondary | ICD-10-CM

## 2022-12-12 HISTORY — DX: Paraplegia, unspecified: G82.20

## 2022-12-12 LAB — BASIC METABOLIC PANEL
Anion gap: 12 (ref 5–15)
BUN: 29 mg/dL — ABNORMAL HIGH (ref 8–23)
CO2: 24 mmol/L (ref 22–32)
Calcium: 8.7 mg/dL — ABNORMAL LOW (ref 8.9–10.3)
Chloride: 98 mmol/L (ref 98–111)
Creatinine, Ser: 1.45 mg/dL — ABNORMAL HIGH (ref 0.44–1.00)
GFR, Estimated: 36 mL/min — ABNORMAL LOW (ref >60)
Glucose, Bld: 101 mg/dL — ABNORMAL HIGH (ref 70–99)
Potassium: 4.1 mmol/L (ref 3.5–5.1)
Sodium: 134 mmol/L — ABNORMAL LOW (ref 135–145)

## 2022-12-12 LAB — COMPREHENSIVE METABOLIC PANEL
ALT: 15 U/L (ref 0–44)
AST: 27 U/L (ref 15–41)
Albumin: 2.1 g/dL — ABNORMAL LOW (ref 3.5–5.0)
Alkaline Phosphatase: 61 U/L (ref 38–126)
Anion gap: 9 (ref 5–15)
BUN: 32 mg/dL — ABNORMAL HIGH (ref 8–23)
CO2: 23 mmol/L (ref 22–32)
Calcium: 8.1 mg/dL — ABNORMAL LOW (ref 8.9–10.3)
Chloride: 101 mmol/L (ref 98–111)
Creatinine, Ser: 1.31 mg/dL — ABNORMAL HIGH (ref 0.44–1.00)
GFR, Estimated: 40 mL/min — ABNORMAL LOW (ref >60)
Glucose, Bld: 105 mg/dL — ABNORMAL HIGH (ref 70–99)
Potassium: 4.6 mmol/L (ref 3.5–5.1)
Sodium: 133 mmol/L — ABNORMAL LOW (ref 135–145)
Total Bilirubin: 0.6 mg/dL (ref 0.3–1.2)
Total Protein: 5 g/dL — ABNORMAL LOW (ref 6.5–8.1)

## 2022-12-12 LAB — GLUCOSE, CAPILLARY
Glucose-Capillary: 115 mg/dL — ABNORMAL HIGH (ref 70–99)
Glucose-Capillary: 118 mg/dL — ABNORMAL HIGH (ref 70–99)
Glucose-Capillary: 119 mg/dL — ABNORMAL HIGH (ref 70–99)
Glucose-Capillary: 101 mg/dL — ABNORMAL HIGH (ref 70–99)

## 2022-12-12 LAB — CBC
HCT: 21.9 % — ABNORMAL LOW (ref 36.0–46.0)
Hemoglobin: 7 g/dL — ABNORMAL LOW (ref 12.0–15.0)
MCH: 26.9 pg (ref 26.0–34.0)
MCHC: 32 g/dL (ref 30.0–36.0)
MCV: 84.2 fL (ref 80.0–100.0)
Platelets: 189 10*3/uL (ref 150–400)
RBC: 2.6 MIL/uL — ABNORMAL LOW (ref 3.87–5.11)
RDW: 16.7 % — ABNORMAL HIGH (ref 11.5–15.5)
WBC: 7.1 10*3/uL (ref 4.0–10.5)
nRBC: 0 % (ref 0.0–0.2)

## 2022-12-12 LAB — MAGNESIUM: Magnesium: 2.2 mg/dL (ref 1.7–2.4)

## 2022-12-12 MED ORDER — BISACODYL 10 MG RE SUPP
10.0000 mg | Freq: Every day | RECTAL | Status: DC
  Administered 2022-12-15 – 2023-01-11 (×27): 10 mg via RECTAL
  Filled 2022-12-12 (×30): qty 1

## 2022-12-12 MED ORDER — PROCHLORPERAZINE MALEATE 5 MG PO TABS
5.0000 mg | ORAL_TABLET | Freq: Four times a day (QID) | ORAL | Status: DC | PRN
  Filled 2022-12-12: qty 2

## 2022-12-12 MED ORDER — MELATONIN 5 MG PO TABS
5.0000 mg | ORAL_TABLET | Freq: Every day | ORAL | Status: DC
  Administered 2022-12-12: 5 mg via ORAL
  Filled 2022-12-12: qty 1

## 2022-12-12 MED ORDER — HEPARIN SODIUM (PORCINE) 5000 UNIT/ML IJ SOLN: 5000.0000 [IU] | Freq: Three times a day (TID) | INTRAMUSCULAR | Status: DC

## 2022-12-12 MED ORDER — PRAVASTATIN SODIUM 40 MG PO TABS
40.0000 mg | ORAL_TABLET | Freq: Every day | ORAL | Status: DC
  Administered 2022-12-13 – 2023-01-12 (×30): 40 mg via ORAL
  Filled 2022-12-12 (×30): qty 1

## 2022-12-12 MED ORDER — DIPHENHYDRAMINE HCL 25 MG PO CAPS: 25.0000 mg | ORAL_CAPSULE | Freq: Four times a day (QID) | ORAL | Status: DC | PRN

## 2022-12-12 MED ORDER — BISACODYL 10 MG RE SUPP: 10.0000 mg | Freq: Every day | RECTAL | Status: DC | PRN

## 2022-12-12 MED ORDER — METOPROLOL TARTRATE 25 MG PO TABS
25.0000 mg | ORAL_TABLET | Freq: Two times a day (BID) | ORAL | Status: DC
  Administered 2022-12-12 – 2022-12-16 (×7): 25 mg via ORAL
  Filled 2022-12-12 (×8): qty 1

## 2022-12-12 MED ORDER — ASPIRIN 81 MG PO TBEC
81.0000 mg | DELAYED_RELEASE_TABLET | Freq: Every day | ORAL | Status: DC
  Administered 2022-12-13 – 2022-12-31 (×19): 81 mg via ORAL
  Filled 2022-12-12 (×19): qty 1

## 2022-12-12 MED ORDER — ALUM & MAG HYDROXIDE-SIMETH 200-200-20 MG/5ML PO SUSP: 30.0000 mL | ORAL | Status: DC | PRN

## 2022-12-12 MED ORDER — LEVALBUTEROL HCL 0.63 MG/3ML IN NEBU: 0.6300 mg | INHALATION_SOLUTION | RESPIRATORY_TRACT | Status: DC | PRN

## 2022-12-12 MED ORDER — GUAIFENESIN-DM 100-10 MG/5ML PO SYRP: 10.0000 mL | ORAL_SOLUTION | Freq: Four times a day (QID) | ORAL | Status: DC | PRN

## 2022-12-12 MED ORDER — ARFORMOTEROL TARTRATE 15 MCG/2ML IN NEBU
15.0000 ug | INHALATION_SOLUTION | Freq: Two times a day (BID) | RESPIRATORY_TRACT | Status: DC
  Administered 2022-12-12 – 2023-01-12 (×51): 15 ug via RESPIRATORY_TRACT
  Filled 2022-12-12 (×54): qty 2

## 2022-12-12 MED ORDER — APIXABAN 2.5 MG PO TABS: 2.5000 mg | ORAL_TABLET | Freq: Two times a day (BID) | ORAL | Status: DC

## 2022-12-12 MED ORDER — AMIODARONE HCL 200 MG PO TABS
100.0000 mg | ORAL_TABLET | Freq: Every day | ORAL | Status: DC
  Administered 2022-12-13 – 2022-12-30 (×17): 100 mg via ORAL
  Filled 2022-12-12 (×19): qty 1

## 2022-12-12 MED ORDER — OXYCODONE HCL 5 MG PO TABS
5.0000 mg | ORAL_TABLET | Freq: Four times a day (QID) | ORAL | Status: DC | PRN
  Administered 2022-12-14 – 2022-12-30 (×2): 5 mg via ORAL
  Filled 2022-12-12 (×4): qty 1

## 2022-12-12 MED ORDER — PAROXETINE HCL 20 MG PO TABS
20.0000 mg | ORAL_TABLET | Freq: Every day | ORAL | Status: DC
  Administered 2022-12-13 – 2022-12-15 (×3): 20 mg via ORAL
  Filled 2022-12-12 (×3): qty 1

## 2022-12-12 MED ORDER — FLEET ENEMA RE ENEM: 1.0000 | ENEMA | Freq: Once | RECTAL | Status: DC | PRN

## 2022-12-12 MED ORDER — PROCHLORPERAZINE EDISYLATE 10 MG/2ML IJ SOLN: 5.0000 mg | Freq: Four times a day (QID) | INTRAMUSCULAR | Status: DC | PRN

## 2022-12-12 MED ORDER — POLYETHYLENE GLYCOL 3350 17 G PO PACK
17.0000 g | PACK | Freq: Every day | ORAL | Status: DC | PRN
  Filled 2022-12-12: qty 1

## 2022-12-12 MED ORDER — METHOCARBAMOL 500 MG PO TABS
500.0000 mg | ORAL_TABLET | Freq: Four times a day (QID) | ORAL | Status: DC | PRN
  Filled 2022-12-12 (×2): qty 1

## 2022-12-12 MED ORDER — ACETAMINOPHEN 325 MG PO TABS
325.0000 mg | ORAL_TABLET | ORAL | Status: DC | PRN
  Administered 2022-12-14 – 2022-12-25 (×4): 650 mg via ORAL
  Administered 2022-12-26: 325 mg via ORAL
  Administered 2022-12-30 – 2023-01-09 (×3): 650 mg via ORAL
  Filled 2022-12-12 (×10): qty 2

## 2022-12-12 MED ORDER — APIXABAN 2.5 MG PO TABS
2.5000 mg | ORAL_TABLET | Freq: Two times a day (BID) | ORAL | Status: DC
  Administered 2022-12-12 – 2023-01-01 (×40): 2.5 mg via ORAL
  Filled 2022-12-12 (×41): qty 1

## 2022-12-12 MED ORDER — PROCHLORPERAZINE 25 MG RE SUPP: 12.5000 mg | Freq: Four times a day (QID) | RECTAL | Status: DC | PRN

## 2022-12-12 MED ORDER — PANTOPRAZOLE SODIUM 40 MG PO TBEC
40.0000 mg | DELAYED_RELEASE_TABLET | Freq: Every day | ORAL | Status: DC
  Administered 2022-12-13 – 2023-01-01 (×20): 40 mg via ORAL
  Filled 2022-12-12 (×20): qty 1

## 2022-12-12 MED ORDER — REVEFENACIN 175 MCG/3ML IN SOLN
175.0000 ug | Freq: Every day | RESPIRATORY_TRACT | Status: DC
  Administered 2022-12-14 – 2023-01-12 (×24): 175 ug via RESPIRATORY_TRACT
  Filled 2022-12-12 (×31): qty 3

## 2022-12-12 MED ORDER — ORAL CARE MOUTH RINSE: 15.0000 mL | OROMUCOSAL | Status: DC | PRN

## 2022-12-12 NOTE — Discharge Summary (Signed)
EVAR Discharge Summary   Heather Molina 04-May-1940 82 y.o. 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  She underwent TEVAR with right femoral endarterectomy and patch angioplasty by Dr. Hetty Blend on 12/07/2022.  She tolerated the procedure well and was admitted to the hospital postoperatively.  POD 1 she was unable to move her legs.  Dr. Hetty Blend discussed with the patient that the only treatment would be placement of lumbar drain however the patient and the family decided that they did not want to undergo any further procedures and were aware of the likelihood of long-term paralysis of bilateral lower extremities.  She was transferred out of the ICU after 24 hours.  AKI had resolved by this point.  Critical care signed off after transfer out of the ICU.  Throughout her hospital stay she maintained palpable DP pulses.  She was evaluated by PT and OT who recommended inpatient rehabilitation.  It took several days for insurance authorization however she was finally approved for CIR admission.  Eliquis was restarted for atrial fibrillation.  Patient and daughter are agreeable to admission to CIR and she was discharged in stable condition.   CBC    Component Value Date/Time   WBC 7.1 12/12/2022 0352   RBC 2.60 (L) 12/12/2022 0352   HGB 7.0 (L) 12/12/2022 0352   HCT 21.9 (L) 12/12/2022 0352   PLT 189 12/12/2022 0352   MCV 84.2 12/12/2022 0352   MCH 26.9 12/12/2022 0352   MCHC 32.0 12/12/2022 0352   RDW 16.7 (H) 12/12/2022 0352   LYMPHSABS 0.5 (L) 12/07/2022 0040   MONOABS 1.2 (H) 12/07/2022 0040    EOSABS 0.1 12/07/2022 0040   BASOSABS 0.1 12/07/2022 0040    BMET    Component Value Date/Time   NA 134 (L) 12/12/2022 0352   K 4.1 12/12/2022 0352   CL 98 12/12/2022 0352   CO2 24 12/12/2022 0352   GLUCOSE 101 (H) 12/12/2022 0352   BUN 29 (H) 12/12/2022 0352   CREATININE 1.45 (H) 12/12/2022 0352   CALCIUM 8.7 (L) 12/12/2022 0352   GFRNONAA 36 (L) 12/12/2022 0352   GFRAA >60 05/14/2019 0414         Discharge Diagnosis:  Status post surgery [Z98.890] Thoracic aortic dissection (HCC) [I71.019]  Secondary Diagnosis: Patient Active Problem List   Diagnosis Date Noted   Intramural aortic hematoma (HCC) 12/08/2022   Spinal cord infarction (HCC) 12/08/2022   Hyperglycemia 12/08/2022   Status post surgery 12/07/2022   Thoracic aortic dissection (HCC) 12/07/2022   Acute on chronic respiratory failure with hypoxia (HCC) 08/30/2022   Elevated brain natriuretic peptide (BNP) level 08/30/2022   COPD with acute exacerbation (HCC) 08/30/2022   Paroxysmal atrial fibrillation (HCC) 06/29/2022   CAD (coronary artery disease) 06/29/2022   Hypertension 06/02/2022   Acute foot pain, right 04/15/2022   Respiratory infection 04/15/2022   History of CVA (cerebrovascular accident) 12/26/2020   Cigarette nicotine dependence without complication 12/26/2020   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/26/2020   Irritable bowel syndrome with diarrhea 09/27/2019   Generalized edema 08/08/2019   CAP (community acquired pneumonia) 05/13/2019   Hypoxia 05/13/2019  NSVT (nonsustained ventricular tachycardia) (HCC) 05/13/2019   Leukocytosis 05/13/2019   Prolonged Q-T interval on ECG 05/13/2019   Anxiety 05/13/2019   Lipoma of torso 04/07/2019   Age-related osteoporosis without current pathological fracture 04/06/2018   Arthritis, multiple joint involvement 04/06/2018   Mixed dyslipidemia 04/06/2018   Recurrent major depressive disorder, in full remission (HCC) 04/06/2018    Right inguinal hernia 03/02/2018   Calculus of gallbladder with chronic cholecystitis without obstruction 04/30/2016   TOBACCO ABUSE 05/21/2009   ALLERGIC RHINITIS 05/20/2009   C O P D 05/20/2009   G E R D 05/20/2009   Past Medical History:  Diagnosis Date   Acute foot pain, right 04/15/2022   Acute on chronic respiratory failure with hypoxia (HCC) 08/30/2022   Age-related osteoporosis without current pathological fracture 04/06/2018   ALLERGIC RHINITIS 05/20/2009   Qualifier: Diagnosis of   By: Excell Seltzer CMA, Lawson Fiscal      Replacing diagnoses that were inactivated after the 05/25/22 regulatory import   Anxiety    Arthritis, multiple joint involvement 04/06/2018   C O P D 05/20/2009   Qualifier: Diagnosis of   By: Excell Seltzer CMA, Lori      Replacing diagnoses that were inactivated after the 05/25/22 regulatory import   CAD (coronary artery disease) 06/29/2022   Calculus of gallbladder with chronic cholecystitis without obstruction 04/30/2016   CAP (community acquired pneumonia) 05/13/2019   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/26/2020   Cigarette nicotine dependence without complication 12/26/2020   COPD with acute exacerbation (HCC) 08/30/2022   Elevated brain natriuretic peptide (BNP) level 08/30/2022   G E R D 05/20/2009   Qualifier: Diagnosis of   By: Excell Seltzer CMA, Lori       Generalized edema 08/08/2019   History of CVA (cerebrovascular accident) 12/26/2020   Hypertension    Hypoxia 05/13/2019   Irritable bowel syndrome with diarrhea 09/27/2019   Leukocytosis 05/13/2019   Lipoma of torso 04/07/2019   Mixed dyslipidemia 04/06/2018   NSVT (nonsustained ventricular tachycardia) (HCC) 05/13/2019   Paroxysmal atrial fibrillation (HCC) 06/29/2022   Prolonged Q-T interval on ECG 05/13/2019   Recurrent major depressive disorder, in full remission (HCC) 04/06/2018   Respiratory infection 04/15/2022   Right inguinal hernia 03/02/2018   Sebaceous cyst 04/07/2019    TOBACCO ABUSE 05/21/2009   Qualifier: Diagnosis of   By: Craige Cotta MD, Vineet         Allergies as of 12/12/2022       Reactions   Ceftriaxone Hives, Shortness Of Breath, Swelling, Rash   Redness    Albuterol Other (See Comments)   HEART PALPATIONS AND JITTERY        Medication List     STOP taking these medications    doxycycline 100 MG tablet Commonly known as: VIBRA-TABS   sulfamethoxazole-trimethoprim 800-160 MG tablet Commonly known as: BACTRIM DS       TAKE these medications    acetaminophen 500 MG tablet Commonly known as: TYLENOL Take 500 mg by mouth as needed for moderate pain.   amiodarone 200 MG tablet Commonly known as: PACERONE Take 0.5 tablets (100 mg total) by mouth daily.   aspirin EC 81 MG tablet Take 81 mg by mouth daily.   Breztri Aerosphere 160-9-4.8 MCG/ACT Aero Generic drug: Budeson-Glycopyrrol-Formoterol Inhale 2 puffs into the lungs in the morning and at bedtime.   cholecalciferol 25 MCG (1000 UNIT) tablet Commonly known as: VITAMIN D3 Take 1,000 Units by mouth daily.   diphenoxylate-atropine 2.5-0.025 MG tablet Commonly known  as: LOMOTIL Take 1 tablet by mouth as needed for diarrhea or loose stools.   Eliquis 2.5 MG Tabs tablet Generic drug: apixaban Take 2.5 mg by mouth 2 (two) times daily.   furosemide 20 MG tablet Commonly known as: LASIX Take 1 tablet by mouth daily.   levalbuterol 1.25 MG/0.5ML nebulizer solution Commonly known as: XOPENEX Take 1.25 mg by nebulization as needed for wheezing or shortness of breath.   levalbuterol 45 MCG/ACT inhaler Commonly known as: XOPENEX HFA Inhale into the lungs.   metoprolol tartrate 25 MG tablet Commonly known as: LOPRESSOR Take 25 mg by mouth 2 (two) times daily.   mupirocin ointment 2 % Commonly known as: BACTROBAN Apply 1 Application topically 3 (three) times daily.   PARoxetine 20 MG tablet Commonly known as: PAXIL Take 20 mg by mouth daily.   pravastatin 40 MG  tablet Commonly known as: PRAVACHOL Take 40 mg by mouth daily.   tobramycin 0.3 % ophthalmic solution Commonly known as: TOBREX Place 1-4 drops into the left eye See admin instructions. Instil 1 drop into the left eye the day before injection. Instill 2 drops  into the left eye at breakfast, lunch, and bedtime the day of injection. Instill 4 drops  into the left eye the morning after injection.        Discharge Instructions:   [facility]  Discharge Instructions Endovascular Aortic Aneurysm Repair  Please refer to the following instructions for your post-procedure care. Your surgeon or Physician Assistant will discuss any changes with you.  Activity  You are encouraged to walk as much as you can. You can slowly return to normal activities but must avoid strenuous activity and heavy lifting until your doctor tells you it's OK. Avoid activities such as vacuuming or swinging a gold club. It is normal to feel tired for several weeks after your surgery. Do not drive until your doctor gives the OK and you are no longer taking prescription pain medications. It is also normal to have difficulty with sleep habits, eating, and bowel movements after surgery. These will go away with time.  Bathing/Showering  You may shower after you go home. If you have an incision, do not soak in a bathtub, hot tub, or swim until the incision heals completely.  Incision Care  Shower every day. Clean your incision with mild soap and water. Pat the area dry with a clean towel. You do not need a bandage unless otherwise instructed. Do not apply any ointments or creams to your incision. If you clothing is irritating, you may cover your incision with a dry gauze pad.  Diet  Resume your normal diet. There are no special food restrictions following this procedure. A low fat/low cholesterol diet is recommended for all patients with vascular disease. In order to heal from your surgery, it  is CRITICAL to get adequate nutrition. Your body requires vitamins, minerals, and protein. Vegetables are the best source of vitamins and minerals. Vegetables also provide the perfect balance of protein. Processed food has little nutritional value, so try to avoid this.  Medications  Resume taking all of your medications unless your doctor or Physician Assistnat tells you not to. If your incision is causing pain, you may take over-the-counter pain relievers such as acetaminophen (Tylenol). If you were prescribed a stronger pain medication, please be aware these medications can cause nausea and constipation. Prevent nausea by taking the medication with a snack or meal. Avoid constipation by drinking plenty of fluids  and eating foods with a high amount of fiber, such as fruits, vegetables, and grains. Do not take Tylenol if you are taking prescription pain medications.   Follow up  Our office will schedule a follow-up appointment with a C.T. scan 3-4 weeks after your surgery.  Please call us immediately for any of the following conditions  Severe or worsening pain in your legs or feet or in your abdomen back or chest. Increased pain, redness, drainage (pus) from your incision sit. Increased abdominal pain, bloating, nausea, vomiting or persistent diarrhea. Fever of 101 degrees or higher. Swelling in your leg (s),  Reduce your risk of vascular disease  Stop smoking. If you would like help call [facility] at 1-800-QUIT-NOW (4795637755) or [facility] at [phone]. Manage your cholesterol Maintain a desired weight Control your diabetes Keep your blood pressure down  If you have questions, please call the office at [phone].     Disposition: CIR  Patient's condition: is Good  Follow up: 1. Dr. Hetty Blend in 4 weeks with CTA protocol   Emilie Rutter, PA-C [facility] [phone] 12/12/2022  8:39 AM   - For VQI Registry use - Post-op:  Time to  Extubation: [x]  In OR, [ ]  < 12 hrs, [ ]  12-24 hrs, [ ]  >=24 hrs Vasopressors Req. Post-op: No MI: No., [ ]  Troponin only, [ ]  EKG or Clinical New Arrhythmia: No CHF: No ICU Stay: 2 days in ICU Transfusion: No     Complications: Resp failure: No., [ ]  Pneumonia, [ ]  Ventilator Chg in renal function: No., [ ]  Inc. Cr > 0.5, [ ]  Temp. Dialysis,  [ ]  Permanent dialysis Leg ischemia: No., no Surgery needed, [ ]  Yes, Surgery needed,  [ ]  Amputation Bowel ischemia: No., [ ]  Medical Rx, [ ]  Surgical Rx Wound complication: No., [ ]  Superficial separation/infection, [ ]  Return to OR Return to OR: No  Return to OR for bleeding: No Stroke: No., [ ]  Minor, [ ]  Major  Discharge medications: Statin use:  No  ASA use:  Yes  Plavix use:  No  Beta blocker use:  Yes  ARB use:  No ACEI use:  No CCB use:  No

## 2022-12-12 NOTE — Plan of Care (Signed)
  Problem: Education: Goal: Knowledge of General Education information will improve Description: Including pain rating scale, medication(s)/side effects and non-pharmacologic comfort measures Outcome: Progressing   Problem: Clinical Measurements: Goal: Will remain free from infection Outcome: Progressing   Problem: Clinical Measurements: Goal: Respiratory complications will improve Outcome: Progressing   Problem: Clinical Measurements: Goal: Cardiovascular complication will be avoided Outcome: Progressing   Problem: Nutrition: Goal: Adequate nutrition will be maintained Outcome: Progressing

## 2022-12-12 NOTE — H&P (Signed)
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  She was started on esmolol and transferred to the [facility] campus.  Vascular surgery consulted and operative consent obtained for TEVAR. She was taken to the operating room by Dr. Hetty Blend and underwent TEVAR with coverage of LSA, right femoral endarterectomy and patch angioplasty.  She was extubated and transferred to the intensive care unit for observation.  Critical care management consulted for ICU management.  On postop day 1, she was unable to move her feet. Discussion by Dr. Hetty Blend with patient and family regarding spinal cord ischemia.  She was told of high likelihood of long-term paralysis.  It was explained to her that pressure elevation and lumbar drain placement was the only treatment to maximize recovery potential.  The patient deferred undergoing any further procedures.  Blood pressure control with MAP goal greater than 100 for 48 hours continued.  She required replacement of Foley catheter on 10/16 due to retention.  PT and OT evaluations obtained.  She remains on heparin subcutaneously for DVT prophylaxis.  She has been cleared to restart Eliquis by vascular surgery as of 10/18.  Heart rate controlled on Lopressor and Pacerone.  History of COPD and continues with nebulizer treatments.  She is oxygen dependent and maintaining saturations on 2 L via nasal cannula.  Appears to have had acute kidney injury and unknown history of chronic kidney disease.  Serum creatinine  continues to improve.  She has had 450 cc of urine output last 24 hours 10/17-10/18. She is tolerating regular diet. The patient requires inpatient medicine and rehabilitation evaluations and services for ongoing dysfunction secondary to spinal card ischemia.  The patient lives with her daughter, [name], who is retired. Present at bedside. Patient reports she had pain in her shoulders and chest muscles after working with therapy a lot yesterday.  This has improved today.  She indicates she cannot tell when she needs to have a BM and cannot feel it when she has a BM.  She reports intact sensation in her legs.  Daughter indicates depressed mood. (Is on paroxetine for history of depression)  Review of Systems  Constitutional:  Negative for chills and fever.  HENT:  Negative for congestion.   Eyes:  Negative for double vision.  Respiratory:  Negative for shortness of breath.   Cardiovascular:  Negative for chest pain.  Gastrointestinal:  Positive for diarrhea. Negative for abdominal pain, nausea and vomiting.  Genitourinary:        Foley catheter  Musculoskeletal:  Positive for joint pain. Negative for back pain and neck pain.  Skin:  Negative for rash.  Neurological:  Positive for sensory change and focal weakness. Negative for speech change.  Psychiatric/Behavioral:  Positive for depression.    Past Medical History:  Diagnosis Date   Acute foot pain, right 04/15/2022   Acute on chronic respiratory failure with hypoxia (HCC) 08/30/2022   Age-related osteoporosis without current pathological fracture 04/06/2018   ALLERGIC RHINITIS 05/20/2009   Qualifier: Diagnosis of   By: Excell Seltzer CMA, Lawson Fiscal      Replacing diagnoses that were  inactivated after the 05/25/22 regulatory import   Anxiety    Arthritis, multiple joint involvement 04/06/2018   C O P D 05/20/2009   Qualifier: Diagnosis of   By: Excell Seltzer CMA, Lori      Replacing diagnoses that were inactivated after the 05/25/22 regulatory import   CAD  (coronary artery disease) 06/29/2022   Calculus of gallbladder with chronic cholecystitis without obstruction 04/30/2016   CAP (community acquired pneumonia) 05/13/2019   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/26/2020   Cigarette nicotine dependence without complication 12/26/2020   COPD with acute exacerbation (HCC) 08/30/2022   Elevated brain natriuretic peptide (BNP) level 08/30/2022   G E R D 05/20/2009   Qualifier: Diagnosis of   By: Excell Seltzer CMA, Lori       Generalized edema 08/08/2019   History of CVA (cerebrovascular accident) 12/26/2020   Hypertension    Hypoxia 05/13/2019   Irritable bowel syndrome with diarrhea 09/27/2019   Leukocytosis 05/13/2019   Lipoma of torso 04/07/2019   Mixed dyslipidemia 04/06/2018   NSVT (nonsustained ventricular tachycardia) (HCC) 05/13/2019   Paroxysmal atrial fibrillation (HCC) 06/29/2022   Prolonged Q-T interval on ECG 05/13/2019   Recurrent major depressive disorder, in full remission (HCC) 04/06/2018   Respiratory infection 04/15/2022   Right inguinal hernia 03/02/2018   Sebaceous cyst 04/07/2019   TOBACCO ABUSE 05/21/2009   Qualifier: Diagnosis of   By: Craige Cotta MD, Vineet       Past Surgical History:  Procedure Laterality Date   CHOLECYSTECTOMY     ENDARTERECTOMY FEMORAL Right 12/07/2022   Procedure: RIGHT FEMORAL ENDARTERECTOMY;  Surgeon: Daria Pastures, MD;  Location: [facility] OR;  Service: Vascular;  Laterality: Right;   HERNIA REPAIR     PATCH ANGIOPLASTY Right 12/07/2022   Procedure: RIGHT FEMORAL PATCH ANGIOPLASTY USING BOVINE PATCH;  Surgeon: Daria Pastures, MD;  Location: [facility] OR;  Service: Vascular;  Laterality: Right;   THORACIC AORTIC ENDOVASCULAR STENT GRAFT N/A 12/07/2022   Procedure: TEVAR;  Surgeon: Daria Pastures, MD;  Location: [facility] OR;  Service: Vascular;  Laterality: N/A;   ULTRASOUND GUIDANCE FOR VASCULAR ACCESS  12/07/2022   Procedure: ULTRASOUND GUIDANCE FOR VASCULAR ACCESS;  Surgeon: Daria Pastures, MD;  Location: [facility] OR;  Service: Vascular;;   Family History  Problem Relation Age of Onset   Epilepsy Father    CVA Father    Social History:  reports that she has quit smoking. She has never used smokeless tobacco. She reports that she does not currently use alcohol. She reports that she does not currently use drugs. Allergies:  Allergies  Allergen Reactions   Ceftriaxone Hives, Shortness Of Breath, Swelling and Rash    Redness    Albuterol Other (See Comments)    HEART PALPATIONS AND JITTERY   Medications Prior to Admission  Medication Sig Dispense Refill   acetaminophen (TYLENOL) 500 MG tablet Take 500 mg by mouth as needed for moderate pain.     amiodarone (PACERONE) 200 MG tablet Take 0.5 tablets (100 mg total) by mouth daily. 30 tablet 0   aspirin 81 MG EC tablet Take 81 mg by mouth daily.     BREZTRI AEROSPHERE 160-9-4.8 MCG/ACT AERO Inhale 2 puffs into the lungs in the morning and at bedtime.     cholecalciferol (VITAMIN D3) 25 MCG (1000 UNIT) tablet Take 1,000 Units by mouth daily.     diphenoxylate-atropine (LOMOTIL) 2.5-0.025 MG tablet Take 1 tablet by mouth as needed for diarrhea or loose stools.  ELIQUIS 2.5 MG TABS tablet Take 2.5 mg by mouth 2 (two) times daily.     furosemide (LASIX) 20 MG tablet Take 1 tablet by mouth daily.     levalbuterol (XOPENEX HFA) 45 MCG/ACT inhaler Inhale into the lungs.     levalbuterol (XOPENEX) 1.25 MG/0.5ML nebulizer solution Take 1.25 mg by nebulization as needed for wheezing or shortness of breath.     metoprolol tartrate (LOPRESSOR) 25 MG tablet Take 25 mg by mouth 2 (two) times daily. (Patient not taking: Reported on 12/07/2022)     mupirocin ointment (BACTROBAN) 2 % Apply 1 Application topically 3 (three) times daily.     PARoxetine (PAXIL) 20 MG tablet Take 20 mg by mouth daily.     pravastatin (PRAVACHOL) 40 MG tablet Take 40 mg by mouth daily.     tobramycin (TOBREX) 0.3 % ophthalmic solution Place 1-4 drops into the left  eye See admin instructions. Instil 1 drop into the left eye the day before injection. Instill 2 drops  into the left eye at breakfast, lunch, and bedtime the day of injection. Instill 4 drops  into the left eye the morning after injection. (Patient not taking: Reported on 12/03/2022)        Home:  Home: Home Living Family/patient expects to be discharged to:: Private residence Living Arrangements: Children Available Help at Discharge: Family Type of Home: House Home Access: Ramped entrance, Stairs to enter [employer] of Steps: 3 or 4 short steps Home Layout: One level Bathroom Shower/Tub: Health visitor: Standard Bathroom Accessibility: Yes Home Equipment: Information systems manager, Agricultural consultant (2 wheels), [employer] - single point  Lives With: Daughter   Functional History: Prior Function Prior Level of Function : Independent/Modified Independent Mobility Comments: started using RW a few days ago ADLs Comments: indep   Functional Status:  Mobility: Bed Mobility Overal bed mobility: Needs Assistance Bed Mobility: Rolling, Sidelying to Sit Rolling: Mod assist, Used rails Sidelying to sit: Mod assist Sit to supine: Max assist General bed mobility comments: pt cued to use pad under thigh to lift thigh for bending Rt knee with therapist moving LLE. pt able to reach for rail and roll upper body then utilize UB to move legs off bed with mod assist to elevate trunk to sitting. Transfers Overall transfer level: Needs assistance Equipment used:  (lateral scoot with bed pad) Transfers: Bed to chair/wheelchair/BSC Bed to/from chair/wheelchair/BSC transfer type:: Lateral/scoot transfer  Lateral/Scoot Transfers: Max assist, +2 physical assistance General transfer comment: max assist with +2 for safety for seqeuntial lateral scoot to drop arm chair with use of pad under sacrum. Max +2 to scoot back in chair Ambulation/Gait General Gait Details: unable   ADL: ADL Overall  ADL's : Needs assistance/impaired Eating/Feeding: Bed level, Independent Grooming: Oral care, Moderate assistance, Maximal assistance, Sitting Grooming Details (indicate cue type and reason): for sitting balance. Upper Body Bathing: Sitting, Moderate assistance Lower Body Bathing: Total assistance Upper Body Dressing : Maximal assistance, Sitting Lower Body Dressing: Total assistance, Sitting/lateral leans Toilet Transfer: Maximal assistance, +2 for physical assistance, +2 for safety/equipment Toilet Transfer Details (indicate cue type and reason): lateral scoot simulated to chair Functional mobility during ADLs: Maximal assistance, +2 for physical assistance, +2 for safety/equipment   Cognition: Cognition Overall Cognitive Status: Impaired/Different from baseline Orientation Level: Oriented X4 Cognition Arousal: Alert Behavior During Therapy: WFL for tasks assessed/performed Overall Cognitive Status: Impaired/Different from baseline Area of Impairment: Safety/judgement Safety/Judgement: Decreased awareness of deficits General Comments: decreased awareness of balance deficits and need  to correct   Physical Exam: Blood pressure (!) 156/51, pulse 63, temperature (!) 97.5 F (36.4 C), temperature source Oral, resp. rate 16, height 5' (1.524 m), weight 50.2 kg, SpO2 94%.  Patient examined with 2 nurses present   General: No apparent distress HEENT: Head is normocephalic, atraumatic, sclera anicteric, oral mucosa pink and moist, dentition decreased Heart: Reg rate and rhythm. Chest: CTA bilaterally , nonlabored breathing on 2 L nasal cannula Abdomen: Soft, non-tender, non-distended, bowel sounds positive. Extremities: No clubbing, cyanosis, or edema. Pulses are 2+ Psych: Pt's affect is appropriate. Pt is cooperative Skin: Groin incision site looks okay, bruising noted bilateral shins 2 IVs in place left upper extremity looks okay GU: Foley in place draining yellow urine Neuro:     Mental Status: AAOx3, memory intact,  Speech/Languate:  fluent, follows simple commands CRANIAL NERVES: II: PERRL. Visual fields full III, IV, VI: EOM intact, no gaze preference or deviation V: normal sensation bilaterally VII: no asymmetry VIII: normal hearing to speech IX, X: normal palatal elevation XI: 5/5 head turn and 5/5 shoulder shrug bilaterally XII: Tongue midline   MOTOR: RUE: 5/5 Deltoid, 5/5 Biceps, 5/5 Triceps,5/5 Grip LUE: 5/5 Deltoid, 5/5 Biceps, 5/5 Triceps, 5/5 Grip RLE: 0 out of 5 throughout LLE: 0 out of 5 throughout  SENSORY: Normal to touch all 4 extremities to light touch in bilateral upper and lower extremities, patchy altered sensation to cold in lower extremities  No ankle clonus bilaterally  Coordination: Normal finger to nose, no dysmetria    Results for orders placed or performed during the hospital encounter of 12/12/22 (from the past 48 hour(s))  Comprehensive metabolic panel     Status: Abnormal   Collection Time: 12/12/22  7:30 PM  Result Value Ref Range   Sodium 133 (L) 135 - 145 mmol/L   Potassium 4.6 3.5 - 5.1 mmol/L   Chloride 101 98 - 111 mmol/L   CO2 23 22 - 32 mmol/L   Glucose, Bld 105 (H) 70 - 99 mg/dL    Comment: Glucose reference range applies only to samples taken after fasting for at least 8 hours.   BUN 32 (H) 8 - 23 mg/dL   Creatinine, Ser 1.61 (H) 0.44 - 1.00 mg/dL   Calcium 8.1 (L) 8.9 - 10.3 mg/dL   Total Protein 5.0 (L) 6.5 - 8.1 g/dL   Albumin 2.1 (L) 3.5 - 5.0 g/dL   AST 27 15 - 41 U/L   ALT 15 0 - 44 U/L   Alkaline Phosphatase 61 38 - 126 U/L   Total Bilirubin 0.6 0.3 - 1.2 mg/dL   GFR, Estimated 40 (L) >60 mL/min    Comment: (NOTE) Calculated using the CKD-EPI Creatinine Equation (2021)    Anion gap 9 5 - 15    Comment: Performed at [facility], [address], [place], [place] [address]  Glucose, capillary     Status: Abnormal   Collection Time: 12/12/22  8:56 PM  Result Value Ref Range    Glucose-Capillary 101 (H) 70 - 99 mg/dL    Comment: Glucose reference range applies only to samples taken after fasting for at least 8 hours.   Comment 1 Notify RN    No results found.    Blood pressure (!) 156/51, pulse 63, temperature (!) 97.5 F (36.4 C), temperature source Oral, resp. rate 16, height 5' (1.524 m), weight 50.2 kg, SpO2 94%.  Medical Problem List and Plan: 1. 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  Antithrombotics: -DVT/anticoagulation:  Pharmaceutical: Eliquis  -antiplatelet therapy: Aspirin 81 mg daily  3. Pain Management: Tylenol, oxycodone as needed  4. Mood/Behavior/Sleep: LCSW to evaluate and provide emotional support  -continue paroxitine 20 mg daily (hx of depression)  -antipsychotic agents: n/a  5. Neuropsych/cognition: This patient is capable of making decisions on her own behalf.  6. Skin/Wound Care: Routine skin care checks   7. Fluids/Electrolytes/Nutrition: Routine Is and Os and follow-up chemistries  8: Hypertension: monitor TID and prn (hx of a.fib see meds below). 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  Additionally, I have personally reviewed laboratory data, imaging studies, as well as relevant notes and concur with the physician assistant's documentation above.  The patient's status has not changed from the original H&P.  Any changes in documentation from the acute care chart have been noted above.  Fanny Dance, MD, [place]

## 2022-12-12 NOTE — Progress Notes (Addendum)
  Progress Note    12/12/2022 8:35 AM 5 Days Post-Op  Subjective:  no complaints   Vitals:   12/12/22 0242 12/12/22 0727  BP: (!) 159/50 (!) 174/Heather  Pulse:  67  Resp: 15 15  Temp: 97.7 F (36.5 C) 97.6 F (36.4 C)  SpO2:     Physical Exam: Lungs:  non labored Incisions:  groins healing well Extremities:  palpable DP pulses Abdomen:  soft, NT, ND Neurologic: A&O  CBC    Component Value Date/Time   WBC 7.1 12/12/2022 0352   RBC 2.60 (L) 12/12/2022 0352   HGB 7.0 (L) 12/12/2022 0352   HCT 21.9 (L) 12/12/2022 0352   PLT 189 12/12/2022 0352   MCV 84.2 12/12/2022 0352   MCH 26.9 12/12/2022 0352   MCHC 32.0 12/12/2022 0352   RDW 16.7 (H) 12/12/2022 0352   LYMPHSABS 0.5 (L) 12/07/2022 0040   MONOABS 1.2 (H) 12/07/2022 0040   EOSABS 0.1 12/07/2022 0040   BASOSABS 0.1 12/07/2022 0040    BMET    Component Value Date/Time   NA 134 (L) 12/12/2022 0352   K 4.1 12/12/2022 0352   CL 98 12/12/2022 0352   CO2 24 12/12/2022 0352   GLUCOSE 101 (H) 12/12/2022 0352   BUN 29 (H) 12/12/2022 0352   CREATININE 1.45 (H) 12/12/2022 0352   CALCIUM 8.7 (L) 12/12/2022 0352   GFRNONAA 36 (L) 12/12/2022 0352   GFRAA >60 05/14/2019 0414    INR    Component Value Date/Time   INR 1.4 (H) 12/07/2022 0949     Intake/Output Summary (Last 24 hours) at 12/12/2022 0835 Last data filed at 12/12/2022 0600 Gross per 24 hour  Intake 120 ml  Output 1445 ml  Net -1325 ml     Assessment/Plan:  82 y.o. Molina is s/p TEVAR for IMH 5 Days Post-Op   BLE well perfused with palpable DP pulses and sensation intact; minimal motor on exam Ok for discharge to CIR today Ok to resume Eliquis for A fib Office will arrange 1 month CTA c/a/p   Emilie Rutter, PA-C [facility] [phone] 12/12/2022 8:35 AM  I have seen and evaluated the patient. I agree with the PA note as documented above.  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  Groins look fine.   Stable acute blood loss anemia with hemoglobin 7.  Okay for CIR.  States she can minimally wiggle her left toes now.  Cephus Shelling, MD [facility] [place] Office: [phone]

## 2022-12-12 NOTE — Progress Notes (Signed)
Reports squeezing sensation in area where her diaphragm is. Explains the pain has been intermittent for the last three days. Endorses that the pain is similar to pain that she felt prior to the hospital, but to a lesser extent. Endorses pain difficulty to exactly pinpoint where it is due to moving throughout her upper chest area and to the right side of her back. Denies any symptoms of burning like sensation as explains pain usually appears shortly after eating. About 30 minutes later and after being repositioned in bed explains that pain has improved. Charge RN aware.

## 2022-12-12 NOTE — Discharge Instructions (Signed)
   [facility]   Discharge Instructions  Endovascular Aortic Aneurysm Repair  Please refer to the following instructions for your post-procedure care. Your surgeon or Physician Assistant will discuss any changes with you.  Activity  You are encouraged to walk as much as you can. You can slowly return to normal activities but must avoid strenuous activity and heavy lifting until your doctor tells you it's OK. Avoid activities such as vacuuming or swinging a gold club. It is normal to feel tired for several weeks after your surgery. Do not drive until your doctor gives the OK and you are no longer taking prescription pain medications. It is also normal to have difficulty with sleep habits, eating, and bowel movements after surgery. These will go away with time.  Bathing/Showering  You may shower after you go home. If you have an incision, do not soak in a bathtub, hot tub, or swim until the incision heals completely.  Incision Care  Shower every day. Clean your incision with mild soap and water. Pat the area dry with a clean towel. You do not need a bandage unless otherwise instructed. Do not apply any ointments or creams to your incision. If you clothing is irritating, you may cover your incision with a dry gauze pad.  Diet  Resume your normal diet. There are no special food restrictions following this procedure. A low fat/low cholesterol diet is recommended for all patients with vascular disease. In order to heal from your surgery, it is CRITICAL to get adequate nutrition. Your body requires vitamins, minerals, and protein. Vegetables are the best source of vitamins and minerals. Vegetables also provide the perfect balance of protein. Processed food has little nutritional value, so try to avoid this.  Medications  Resume taking all of your medications unless your doctor or nurse practitioner tells you not to. If your incision is causing pain, you may take  over-the-counter pain relievers such as acetaminophen (Tylenol). If you were prescribed a stronger pain medication, please be aware these medications can cause nausea and constipation. Prevent nausea by taking the medication with a snack or meal. Avoid constipation by drinking plenty of fluids and eating foods with a high amount of fiber, such as fruits, vegetables, and grains. Do not take Tylenol if you are taking prescription pain medications.   Follow up  [facility] office will schedule a follow-up appointment with a C.T. scan 3-4 weeks after your surgery.  Please call us immediately for any of the following conditions  Severe or worsening pain in your legs or feet or in your abdomen back or chest. Increased pain, redness, drainage (pus) from your incision sit. Increased abdominal pain, bloating, nausea, vomiting or persistent diarrhea. Fever of 101 degrees or higher. Swelling in your leg (s),  Reduce your risk of vascular disease  Stop smoking. If you would like help call [facility] at 1-800-QUIT-NOW [phone]) or [place] at [phone]. Manage your cholesterol Maintain a desired weight Control your diabetes Keep your blood pressure down  If you have questions, please call the office at [phone].

## 2022-12-13 DIAGNOSIS — G9511 Acute infarction of spinal cord (embolic) (nonembolic): Secondary | ICD-10-CM | POA: Diagnosis not present

## 2022-12-13 DIAGNOSIS — I10 Essential (primary) hypertension: Secondary | ICD-10-CM | POA: Diagnosis not present

## 2022-12-13 DIAGNOSIS — D62 Acute posthemorrhagic anemia: Secondary | ICD-10-CM | POA: Diagnosis not present

## 2022-12-13 DIAGNOSIS — G4700 Insomnia, unspecified: Secondary | ICD-10-CM

## 2022-12-13 MED ORDER — TRAZODONE HCL 50 MG PO TABS: 25.0000 mg | ORAL_TABLET | Freq: Every evening | ORAL | Status: DC | PRN

## 2022-12-13 MED ORDER — MELATONIN 5 MG PO TABS
10.0000 mg | ORAL_TABLET | Freq: Every day | ORAL | Status: DC
  Administered 2022-12-13 – 2022-12-14 (×2): 10 mg via ORAL
  Filled 2022-12-13 (×2): qty 2

## 2022-12-13 MED ORDER — CHLORHEXIDINE GLUCONATE CLOTH 2 % EX PADS
6.0000 | MEDICATED_PAD | Freq: Two times a day (BID) | CUTANEOUS | Status: DC
  Administered 2022-12-13 – 2022-12-14 (×2): 6 via TOPICAL

## 2022-12-13 NOTE — Evaluation (Signed)
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)   Past Medical History:  Past Medical History:  Diagnosis Date   Acute foot pain, right 04/15/2022   Acute on chronic respiratory failure with hypoxia (HCC) 08/30/2022   Age-related osteoporosis without current pathological fracture 04/06/2018   ALLERGIC RHINITIS 05/20/2009   Qualifier: Diagnosis of   By: Excell Seltzer CMA, Lawson Fiscal      Replacing diagnoses that were inactivated after the 05/25/22 regulatory import   Anxiety    Arthritis, multiple joint involvement 04/06/2018   C O P D 05/20/2009   Qualifier: Diagnosis of   By: Excell Seltzer CMA, Lori      Replacing diagnoses that were inactivated after the 05/25/22 regulatory import   CAD (coronary artery disease) 06/29/2022   Calculus of gallbladder with chronic cholecystitis without obstruction 04/30/2016   CAP (community acquired pneumonia) 05/13/2019   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/26/2020   Cigarette nicotine dependence without complication 12/26/2020   COPD with acute exacerbation (HCC) 08/30/2022   Elevated brain natriuretic peptide (BNP) level 08/30/2022   G E R D 05/20/2009   Qualifier: Diagnosis of   By: Excell Seltzer CMA, Lori       Generalized edema 08/08/2019   History of CVA (cerebrovascular accident) 12/26/2020   Hypertension    Hypoxia 05/13/2019   Irritable bowel syndrome with diarrhea 09/27/2019   Leukocytosis 05/13/2019   Lipoma of torso 04/07/2019   Mixed dyslipidemia 04/06/2018   NSVT (nonsustained ventricular tachycardia) (HCC) 05/13/2019   Paroxysmal atrial fibrillation (HCC) 06/29/2022   Prolonged Q-T interval on  ECG 05/13/2019   Recurrent major depressive disorder, in full remission (HCC) 04/06/2018   Respiratory infection 04/15/2022   Right inguinal hernia 03/02/2018   Sebaceous cyst 04/07/2019   TOBACCO ABUSE 05/21/2009   Qualifier: Diagnosis of   By: Craige Cotta MD, Vineet       Past Surgical History:  Past Surgical History:  Procedure Laterality Date   CHOLECYSTECTOMY     ENDARTERECTOMY FEMORAL Right 12/07/2022   Procedure: RIGHT FEMORAL ENDARTERECTOMY;  Surgeon: Daria Pastures, MD;  Location: [facility] OR;  Service: Vascular;  Laterality: Right;   HERNIA REPAIR     PATCH ANGIOPLASTY Right 12/07/2022   Procedure: RIGHT FEMORAL PATCH ANGIOPLASTY USING BOVINE PATCH;  Surgeon: Daria Pastures, MD;  Location: [facility] OR;  Service: Vascular;  Laterality: Right;   THORACIC AORTIC ENDOVASCULAR STENT GRAFT N/A 12/07/2022   Procedure: TEVAR;  Surgeon: Daria Pastures, MD;  Location: [facility] OR;  Service: Vascular;  Laterality: N/A;   ULTRASOUND GUIDANCE FOR VASCULAR ACCESS  12/07/2022   Procedure: ULTRASOUND GUIDANCE FOR VASCULAR ACCESS;  Surgeon: Daria Pastures, MD;  Location: [facility] OR;  Service: Vascular;;    Assessment & Plan Clinical Impression: Patient is a 82 y.o. year old female withwith a history of paroxysmal atrial fibrillation maintained on Eliquis who presented to [facility] on 12/06/2022 with acute chest and back pain.  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  She was started on esmolol and transferred to the [facility] campus.  Vascular surgery consulted and operative consent obtained for TEVAR. She was taken to the operating room by Dr. Hetty Blend and underwent TEVAR with coverage of LSA, right femoral endarterectomy and patch angioplasty.  She was extubated and transferred to the intensive care unit for observation.  Critical care management  consulted for ICU management.  On postop day 1, she was unable to move her feet. Discussion by Dr. Hetty Blend with patient and family regarding spinal cord ischemia.  She was told of high likelihood of long-term paralysis.  It was explained to her that pressure elevation and lumbar drain placement was the only treatment to maximize recovery potential.  The patient deferred undergoing any further procedures.  Blood pressure control with MAP goal greater than 100 for 48 hours continued.  She required replacement of Foley catheter on 10/16 due to retention.  PT and OT evaluations obtained.  She remains on heparin subcutaneously for DVT prophylaxis.  She has been cleared to restart Eliquis by vascular surgery as of 10/18.  Heart rate controlled on Lopressor and Pacerone.  History of COPD and continues with nebulizer treatments.  She is oxygen dependent and maintaining saturations on 2 L via nasal cannula.  Appears to have had acute kidney injury and unknown history of chronic kidney disease.  Serum creatinine continues to improve.  She has had 450 cc of urine output last 24 hours 10/17-10/18. She is tolerating regular diet. The patient requires inpatient medicine and rehabilitation evaluations and services for ongoing dysfunction secondary to spinal card ischemia.   The patient lives with her daughter, Heather [facility], who is retired. Present at bedside. Patient currently complaining of back pain due to be in recliner for almost two hours. She indicates she cannot tell when she needs to have a BM. Daughter indicates depressed mood. (Is on paroxetine for history of depression) Patient transferred to CIR on 12/12/2022 .   Patient currently requires max with mobility secondary to muscle paralysis, decreased cardiorespiratoy endurance and decreased oxygen support, decreased coordination and decreased motor planning, and decreased sitting balance.  Prior to hospitalization, patient was independent  with mobility and lived with  Daughter in a House home.  Home access is  Ramped entrance.  Patient will benefit from skilled PT intervention to maximize safe functional mobility, minimize fall risk, and decrease caregiver burden for planned discharge home with 24 hour assist.  Anticipate patient will benefit from follow up [facility] at discharge.  PT - End of Session Activity Tolerance: Tolerates 30+ min activity with multiple rests Endurance Deficit: Yes Endurance Deficit Description: decreased PT Assessment Rehab Potential (ACUTE/IP ONLY): Fair PT Barriers to Discharge: Neurogenic Bowel & Bladder;Incontinence;Other (comments) (supplemental O2) PT Patient demonstrates impairments in the following area(s): Balance;Endurance;Motor;Skin Integrity;Nutrition PT Transfers Functional Problem(s): Bed Mobility;Bed to Chair;Car PT Locomotion Functional Problem(s): Wheelchair Mobility PT Plan PT Intensity: Minimum of 1-2 x/day ,45 to 90 minutes PT Frequency: 5 out of 7 days PT Duration Estimated Length of Stay: 4 weeks PT Treatment/Interventions: Discharge planning;DME/adaptive equipment instruction;Functional mobility training;Psychosocial support;Splinting/orthotics;Therapeutic Activities;UE/LE Strength taining/ROM;Wheelchair propulsion/positioning;UE/LE Coordination activities;Therapeutic Exercise;Skin care/wound management;Patient/family education;Neuromuscular re-education;Functional electrical stimulation;Disease management/prevention;Community reintegration;Balance/vestibular training PT Transfers Anticipated Outcome(s): Min A PT Locomotion Anticipated Outcome(s): supervision w/c level PT Recommendation Recommendations for Other Services: Therapeutic Recreation consult;Neuropsych consult Therapeutic Recreation Interventions: Stress management Follow Up Recommendations: Home health PT;Outpatient PT Patient destination: Home Equipment Recommended: To be determined Equipment Details: owns RW and [facility]   PT  Evaluation Precautions/Restrictions Precautions Precautions: Fall Precaution Comments: paraplegia Restrictions Weight Bearing Restrictions: No General   Vital  Signs  Pain   Pain Interference Pain Interference Pain Effect on Sleep: 1. Rarely or not at all Pain Interference with Therapy Activities: 1. Rarely or not at all Pain Interference with Day-to-Day Activities: 1. Rarely or not at all Home Living/Prior Functioning Home Living Available Help at Discharge: Family;Available 24 hours/day Type of Home: House Home Access: Ramped entrance Home Layout: One level Bathroom Shower/Tub: Health visitor: Standard Bathroom Accessibility: Yes  Lives With: Daughter Prior Function Level of Independence: Independent with basic ADLs;Independent with gait;Independent with homemaking with ambulation;Independent with transfers  Able to Take Stairs?: Yes Driving: No Vocation: Retired Therapist, music Status: Within Functional Limits for tasks assessed Arousal/Alertness: Awake/alert Orientation Level: Oriented X4 Memory: Impaired Memory Impairment: Decreased short term memory Awareness: Appears intact Problem Solving: Appears intact Safety/Judgment: Appears intact Sensation Sensation Light Touch: Appears Intact Hot/Cold: Appears Intact Proprioception: Impaired by gross assessment Stereognosis: Impaired by gross assessment Coordination Gross Motor Movements are Fluid and Coordinated: No Fine Motor Movements are Fluid and Coordinated: No Coordination and Movement Description: BUE WNL, BLE paralysis Motor  Motor Motor: Paraplegia Motor - Skilled Clinical Observations: paraplegia   Trunk/Postural Assessment  Cervical Assessment Cervical Assessment: Exceptions to [facility] (forward head) Thoracic Assessment Thoracic Assessment: Exceptions to [facility] (rounded shoulders and right lateral lean) Lumbar Assessment Lumbar Assessment: Exceptions to  [facility] (posterior pelvic tilt) Postural Control Postural Control: Deficits on evaluation Trunk Control: impaired, intermittent max A for static sitting balance Righting Reactions: delayed  Balance Balance Balance Assessed: Yes Static Sitting Balance Static Sitting - Balance Support: Bilateral upper extremity supported;Feet supported Static Sitting - Level of Assistance: 2: Max assist;5: Stand by assistance (supervision) Dynamic Sitting Balance Dynamic Sitting - Balance Support: Right upper extremity supported;During functional activity;Feet supported Dynamic Sitting - Level of Assistance: 2: Max assist Extremity Assessment  RUE Assessment RUE Assessment: Within Functional Limits Active Range of Motion (AROM) Comments: WFL General Strength Comments: overall 4-/5 LUE Assessment LUE Assessment: Within Functional Limits Active Range of Motion (AROM) Comments: WFL General Strength Comments: overall 4-/5 RLE Assessment RLE Assessment: Exceptions to [facility] General Strength Comments: 0/5 LLE Assessment LLE Assessment: Exceptions to [facility] General Strength Comments: 0/5  Care Tool Care Tool Bed Mobility Roll left and right activity   Roll left and right assist level: Moderate Assistance - Patient 50 - 74%    Sit to lying activity   Sit to lying assist level: Moderate Assistance - Patient 50 - 74%    Lying to sitting on side of bed activity   Lying to sitting on side of bed assist level: the ability to move from lying on the back to sitting on the side of the bed with no back support.: Maximal Assistance - Patient 25 - 49%     Care Tool Transfers Sit to stand transfer Sit to stand activity did not occur: Safety/medical concerns (paraplegia)      Chair/bed transfer   Chair/bed transfer assist level: Maximal Assistance - Patient 25 - 49%    Car transfer Car transfer activity did not occur: Safety/medical concerns (paraplegia, fatigue)        Care Tool Locomotion Ambulation Ambulation  activity did not occur: Safety/medical concerns (paraplegia, fatigue)        Walk 10 feet activity Walk 10 feet activity did not occur: Safety/medical concerns (paraplegia, fatigue)       Walk 50 feet with 2 turns activity Walk 50 feet with 2 turns activity did not occur: Safety/medical concerns (paraplegia,  fatigue)      Walk 150 feet activity Walk 150 feet activity did not occur: Safety/medical concerns (paraplegia, fatigue)      Walk 10 feet on uneven surfaces activity Walk 10 feet on uneven surfaces activity did not occur: Safety/medical concerns (paraplegia, fatigue)      Stairs Stair activity did not occur: Safety/medical concerns (paraplegia, fatigue)        Walk up/down 1 step activity Walk up/down 1 step or curb (drop down) activity did not occur: Safety/medical concerns      Walk up/down 4 steps activity Walk up/down 4 steps activity did not occur: Safety/medical concerns      Walk up/down 12 steps activity Walk up/down 12 steps activity did not occur: Safety/medical concerns      Pick up small objects from floor Pick up small object from the floor (from standing position) activity did not occur: Safety/medical concerns (paraplegia, fatigue)      Wheelchair Is the patient using a wheelchair?: Yes Type of Wheelchair: Manual Wheelchair activity did not occur: Safety/medical concerns (paraplegia, fatigue)      Wheel 50 feet with 2 turns activity Wheelchair 50 feet with 2 turns activity did not occur: Safety/medical concerns (paraplegia, fatigue)    Wheel 150 feet activity Wheelchair 150 feet activity did not occur: Safety/medical concerns (paraplegia, fatigue)      Refer to Care Plan for Long Term Goals  SHORT TERM GOAL WEEK 1 PT Short Term Goal 1 (Week 1): Pt will require mod A with rolling PT Short Term Goal 2 (Week 1): Pt will require mod A with supine <>sit PT Short Term Goal 3 (Week 1): Pt will require mod A for static sitting balance x 1 minute PT Short  Term Goal 4 (Week 1): Pt will require mod A for slide board transfer from bed<>chair  Recommendations for other services: Neuropsych and Therapeutic Recreation  Stress management  Skilled Therapeutic Intervention Mobility Bed Mobility Bed Mobility: Sit to Supine Rolling Right: Moderate Assistance - Patient 50-74% (using bed rails) Rolling Left: Moderate Assistance - Patient 50-74% (using bed rails) Supine to Sit: Maximal Assistance - Patient - Patient 25-49% (HOB raised, using rail tp pull) Sit to Supine: Moderate Assistance - Patient 50-74% Transfers Transfers: Lateral/Scoot Transfers Lateral/Scoot Transfers: Maximal Assistance - Patient 25-49% Transfer (Assistive device): Other (Comment) (slideboard) Locomotion  Gait Ambulation: No Gait Gait: No Stairs / Additional Locomotion Stairs: No Wheelchair Mobility Wheelchair Mobility: No   PT Evaluation & Treatment:   Pt received seated in TIS reporting left back pain, provided repositioning and rest for relief. PT replaced TIS with liberty manual w/c and provided pt with hybrid ROHO cushion opposed to standard ROHO as pt current mode of transfers are by slide board. Pt dependently transported for energy conservation in session, will need to re-adjust back axle wheel as pt unable to propel w/c due to decreased arm length and inappropriate w/c set-up. Pt dependent for slide board placement and progressed from dependent to mod A with repetition with slide board transfers from [facility] w/c<> mat table. Pt requires supervision for static sitting balance with intermittent max A due to loss of balance. Pt with heavy reliance of UE's due to decreased trunk and postural control. Pt returned by w/c to room and mod A with sit to lying. Pt left semi-reclined in bed with all needs in reach and alarm on.   Discharge Criteria: Patient will be discharged from PT if patient refuses treatment 3 consecutive times without medical reason, if treatment goals  not  met, if there is a change in medical status, if patient makes no progress towards goals or if patient is discharged from hospital.  The above assessment, treatment plan, treatment alternatives and goals were discussed and mutually agreed upon: by patient  Priscille Kluver PT, DPT  12/13/2022, 12:37 PM

## 2022-12-13 NOTE — Progress Notes (Signed)
Inpatient Rehabilitation Admission Medication Review by a Pharmacist  A complete drug regimen review was completed for this patient to identify any potential clinically significant medication issues.  High Risk Drug Classes Is patient taking? Indication by Medication  Antipsychotic Yes Compazine-nausea  Anticoagulant Yes Apixaban- Afib  Antibiotic No   Opioid Yes Oxycodone- pain  Antiplatelet Yes Aspirin- CAD  Hypoglycemics/insulin No   Vasoactive Medication Yes Amiodarone- Afib Metoprolol -Afib, HTN  Chemotherapy No   Other Yes Acetaminophen-pain Arformoterol, levalbuterol- COPD Melatonin-sleep Paroxetine-depression Pantoprazole- GERD Pravastatin- HLD Bisacodyl, miralax, fleets enema-constipation      Type of Medication Issue Identified Description of Issue Recommendation(s)  Drug Interaction(s) (clinically significant)     Duplicate Therapy     Allergy     No Medication Administration End Date     Incorrect Dose     Additional Drug Therapy Needed     Significant med changes from prior encounter (inform family/care partners about these prior to discharge). PTA med: Furosemide, Breztri Aerosphere resumed on discharge orders from [facility] acute admission.  Not resumed on CIR admission. Currently orders for Arformoterol nebs, levalbuterol nebs on CIR admission.   Restart PTA meds when and if necessary during CIR admission or at time of discharge, if warranted    Other       Clinically significant medication issues were identified that warrant physician communication and completion of prescribed/recommended actions by midnight of the next day:  No  Name of provider notified for urgent issues identified:   Provider Method of Notification:    Pharmacist comments:   Time spent performing this drug regimen review (minutes):  20   Noah Delaine, RPh Clinical Pharmacist  12/13/2022 9:35 AM

## 2022-12-13 NOTE — Progress Notes (Signed)
Awake in bed having difficulty falling asleep. Made aware of bowel program. Not wanting to complete bowel program tonight. Wanting to sleep. Lights turned down in room, repositioned, and music/background noise playing to aide inpatient sleeping tonight. Reports difficulty sleeping last few nights.

## 2022-12-13 NOTE — Evaluation (Signed)
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)   Past Medical History:  Past Medical History:  Diagnosis Date   Acute foot pain, right 04/15/2022   Acute on chronic respiratory failure with hypoxia (HCC) 08/30/2022   Age-related osteoporosis without current pathological fracture 04/06/2018   ALLERGIC RHINITIS 05/20/2009   Qualifier: Diagnosis of   By: Excell Seltzer CMA, Lawson Fiscal      Replacing diagnoses that were inactivated after the 05/25/22 regulatory import   Anxiety    Arthritis, multiple joint involvement 04/06/2018   C O P D 05/20/2009   Qualifier: Diagnosis of   By: Excell Seltzer CMA, Lori      Replacing diagnoses that were inactivated after the 05/25/22 regulatory import   CAD (coronary artery disease) 06/29/2022   Calculus of gallbladder with chronic cholecystitis without obstruction 04/30/2016   CAP (community acquired pneumonia) 05/13/2019   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/26/2020   Cigarette nicotine dependence without complication 12/26/2020   COPD with acute exacerbation (HCC) 08/30/2022   Elevated brain natriuretic peptide (BNP) level 08/30/2022   G E R D 05/20/2009   Qualifier: Diagnosis of   By: Excell Seltzer CMA, Lori       Generalized edema 08/08/2019   History of CVA (cerebrovascular accident) 12/26/2020   Hypertension    Hypoxia 05/13/2019   Irritable bowel syndrome with diarrhea 09/27/2019   Leukocytosis 05/13/2019   Lipoma of torso 04/07/2019   Mixed dyslipidemia 04/06/2018   NSVT  (nonsustained ventricular tachycardia) (HCC) 05/13/2019   Paroxysmal atrial fibrillation (HCC) 06/29/2022   Prolonged Q-T interval on ECG 05/13/2019   Recurrent major depressive disorder, in full remission (HCC) 04/06/2018   Respiratory infection 04/15/2022   Right inguinal hernia 03/02/2018   Sebaceous cyst 04/07/2019   TOBACCO ABUSE 05/21/2009   Qualifier: Diagnosis of   By: Craige Cotta MD, Vineet       Past Surgical History:  Past Surgical History:  Procedure Laterality Date   CHOLECYSTECTOMY     ENDARTERECTOMY FEMORAL Right 12/07/2022   Procedure: RIGHT FEMORAL ENDARTERECTOMY;  Surgeon: Daria Pastures, MD;  Location: [facility] OR;  Service: Vascular;  Laterality: Right;   HERNIA REPAIR     PATCH ANGIOPLASTY Right 12/07/2022   Procedure: RIGHT FEMORAL PATCH ANGIOPLASTY USING BOVINE PATCH;  Surgeon: Daria Pastures, MD;  Location: [facility] OR;  Service: Vascular;  Laterality: Right;   THORACIC AORTIC ENDOVASCULAR STENT GRAFT N/A 12/07/2022   Procedure: TEVAR;  Surgeon: Daria Pastures, MD;  Location: [facility] OR;  Service: Vascular;  Laterality: N/A;   ULTRASOUND GUIDANCE FOR VASCULAR ACCESS  12/07/2022   Procedure: ULTRASOUND GUIDANCE FOR VASCULAR ACCESS;  Surgeon: Daria Pastures, MD;  Location: [facility] OR;  Service: Vascular;;    Assessment & Plan Clinical Impression:  Heather Molina is an 82 year old female with a history of paroxysmal atrial fibrillation maintained on Eliquis who presented to [facility] on 12/06/2022 with acute chest and back pain.  Imaging was consistent with acute intramural hematoma from the LSA to the distal descending thoracic aorta with concerning signs of impending rupture and possible active bleeding from  thoracic aortic aneurysm.  She was started on esmolol and transferred to the [facility] campus.  Vascular surgery consulted and operative consent obtained for TEVAR. She was taken to the operating room by Dr. Hetty Blend and underwent TEVAR with coverage of LSA, right femoral endarterectomy  and patch angioplasty.  She was extubated and transferred to the intensive care unit for observation.  Critical care management consulted for ICU management.  On postop day 1, she was unable to move her feet. Discussion by Dr. Hetty Blend with patient and family regarding spinal cord ischemia.  She was told of high likelihood of long-term paralysis.  It was explained to her that pressure elevation and lumbar drain placement was the only treatment to maximize recovery potential.  The patient deferred undergoing any further procedures.  Blood pressure control with MAP goal greater than 100 for 48 hours continued.  She required replacement of Foley catheter on 10/16 due to retention.  PT and OT evaluations obtained.  She remains on heparin subcutaneously for DVT prophylaxis.  She has been cleared to restart Eliquis by vascular surgery as of 10/18.  Heart rate controlled on Lopressor and Pacerone.  History of COPD and continues with nebulizer treatments.  She is oxygen dependent and maintaining saturations on 2 L via nasal cannula.  Appears to have had acute kidney injury and unknown history of chronic kidney disease.  Serum creatinine continues to improve.  She has had 450 cc of urine output last 24 hours 10/17-10/18. She is tolerating regular diet. The patient requires inpatient medicine and rehabilitation evaluations and services for ongoing dysfunction secondary to spinal card ischemia.   The patient lives with her daughter, Heather [facility], who is retired. Present at bedside. Patient reports she had pain in her shoulders and chest muscles after working with therapy a lot yesterday.  This has improved today.  She indicates she cannot tell when she needs to have a BM and cannot feel it when she has a BM.  She reports intact sensation in her legs.  Daughter indicates depressed mood. (Is on paroxetine for history of depression).  Patient transferred to CIR on 12/12/2022.  Patient currently requires total with basic self-care  skills secondary to muscle paralysis, decreased cardiorespiratoy endurance and decreased oxygen support, unbalanced muscle activation and decreased coordination, and decreased sitting balance, decreased postural control, and decreased balance strategies.  Prior to hospitalization, patient could complete ADLs with modified independent .  Patient will benefit from skilled intervention to decrease level of assist with basic self-care skills and increase independence with basic self-care skills prior to discharge home with care partner.  Anticipate patient will require 24 hour supervision and follow up outpatient.  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 OT Patient demonstrates impairments in the following area(s): Balance;Endurance;Motor;Safety OT Basic ADL's Functional Problem(s): Grooming;Bathing;Dressing;Toileting;Eating OT Transfers Functional Problem(s): Toilet OT Additional Impairment(s): None OT Plan OT Intensity: Minimum of 1-2 x/day, 45 to 90 minutes OT Frequency: 5 out of 7 days OT Duration/Estimated Length of Stay: 3-4 weeks OT Treatment/Interventions: Balance/vestibular training;Discharge planning;Functional electrical stimulation;Self Care/advanced ADL retraining;Therapeutic Activities;UE/LE Coordination activities;Pain management;Disease mangement/prevention;Functional mobility training;Patient/family education;Therapeutic Exercise;Skin care/wound managment;Community reintegration;DME/adaptive equipment instruction;Neuromuscular re-education;Psychosocial support;Splinting/orthotics;UE/LE Strength taining/ROM;Wheelchair propulsion/positioning OT Recommendation Recommendations for Other Services: Therapeutic Recreation consult Therapeutic Recreation Interventions: Pet therapy;Outing/community reintergration Patient  destination: Home Follow Up Recommendations: Outpatient OT Equipment Details: owns 3-1 commode (not DABSC), RW, W/C,  shower seat with back   OT Evaluation Precautions/Restrictions  Precautions Precautions: Fall Precaution Comments: incomplete paraplegia Restrictions Weight Bearing Restrictions: No General Chart Reviewed: Yes Family/Caregiver Present: No Pain Pain Assessment Pain Scale: 0-10 Pain Score: 0-No pain Home Living/Prior Functioning Home Living Living Arrangements: Children Available Help at Discharge: Family Type of Home: House Home Access: Ramped entrance, Stairs to enter Home Layout: One level Bathroom Shower/Tub: Health visitor: Standard (reports getting heightened toilet) Bathroom Accessibility: Yes  Lives With: Daughter IADL History Homemaking Responsibilities: Yes Meal Prep Responsibility: Secondary Laundry Responsibility: Secondary Cleaning Responsibility: Secondary Current License: No Occupation: Retired Prior Function Level of Independence: Independent with basic ADLs, Independent with gait, Independent with homemaking with ambulation, Independent with transfers  Able to Take Stairs?: Yes Driving: No Vocation: Retired Leisure: Hobbies-yes (Comment) (word puzzles) Vision Baseline Vision/History: 1 Wears glasses (reading) Ability to See in Adequate Light: 0 Adequate Patient Visual Report: No change from baseline Vision Assessment?: No apparent visual deficits Perception  Perception: Within Functional Limits Praxis Praxis: WFL Cognition Cognition Overall Cognitive Status: Within Functional Limits for tasks assessed Arousal/Alertness: Awake/alert Orientation Level: Person;Place;Situation Person: Oriented Place: Oriented Situation: Oriented Memory: Impaired Memory Impairment: Decreased short term memory (at baseline) Awareness: Appears intact Problem Solving: Appears intact Safety/Judgment: Appears intact Brief Interview  for Mental Status (BIMS) Repetition of Three Words (First Attempt): 3 Temporal Orientation: Year: Correct Temporal Orientation: Month: Accurate within 5 days Temporal Orientation: Day: Correct Recall: "Sock": Yes, no cue required Recall: "Blue": No, could not recall Recall: "Bed": Yes, no cue required BIMS Summary Score: 13 Sensation Sensation Light Touch: Appears Intact (reports in tact sensation in BLE) Hot/Cold: Appears Intact Proprioception: Impaired by gross assessment (BLE d/t paralysis) Stereognosis: Not tested Coordination Gross Motor Movements are Fluid and Coordinated: No Fine Motor Movements are Fluid and Coordinated: No Coordination and Movement Description: BUE WNL, BLE paralysis Motor  Motor Motor: Paraplegia Motor - Skilled Clinical Observations: thoracic level  Trunk/Postural Assessment  Cervical Assessment Cervical Assessment: Exceptions to [facility] (forward head) Thoracic Assessment Thoracic Assessment: Exceptions to [facility] (rounded shoulders) Lumbar Assessment Lumbar Assessment: Exceptions to [facility] (posterior pelvic tilt) Postural Control Postural Control: Deficits on evaluation (delayed trunk control and righting reactions)  Balance Balance Balance Assessed: Yes Static Sitting Balance Static Sitting - Balance Support: Bilateral upper extremity supported;Feet supported Static Sitting - Level of Assistance: 3: Mod assist Dynamic Sitting Balance Dynamic Sitting - Balance Support: Right upper extremity supported;During functional activity;Feet supported Dynamic Sitting - Level of Assistance: 2: Max assist Extremity/Trunk Assessment RUE Assessment RUE Assessment: Within Functional Limits Active Range of Motion (AROM) Comments: WFL General Strength Comments: overall 4-/5 LUE Assessment LUE Assessment: Within Functional Limits Active Range of Motion (AROM) Comments: WFL General Strength Comments: overall 4-/5  Care Tool Care Tool Self Care Eating   Eating  Assist Level: Set up assist    Oral Care    Oral Care Assist Level: Supervision/Verbal cueing    Bathing   Body parts bathed by patient: Right arm;Left arm;Chest;Abdomen;Front perineal area;Face Body parts bathed by helper: Buttocks;Right upper leg;Left upper leg;Right lower leg;Left lower leg   Assist Level: Maximal Assistance - Patient 24 - 49% (bed level)    Upper Body Dressing(including orthotics)   What is the patient wearing?: Pull over shirt   Assist Level: Maximal Assistance - Patient 25 - 49%    Lower Body Dressing (excluding footwear)     Assist for lower body dressing: Dependent - Patient 0%    Putting on/Taking off footwear  What is the patient wearing?: Non-skid slipper socks Assist for footwear: Dependent - Patient 0%       Care Tool Toileting Toileting activity   Assist for toileting: 2 Helpers (bed level)     Care Tool Bed Mobility Roll left and right activity   Roll left and right assist level: Moderate Assistance - Patient 50 - 74%    Sit to lying activity   Sit to lying assist level: Moderate Assistance - Patient 50 - 74%    Lying to sitting on side of bed activity   Lying to sitting on side of bed assist level: the ability to move from lying on the back to sitting on the side of the bed with no back support.: Maximal Assistance - Patient 25 - 49%     Care Tool Transfers Sit to stand transfer Sit to stand activity did not occur: Safety/medical concerns (paraplegia)      Chair/bed transfer   Chair/bed transfer assist level: Total Assistance - Patient < 25% (transfer board)     Toilet transfer Toilet transfer activity did not occur: Safety/medical concerns       Care Tool Cognition  Expression of Ideas and Wants Expression of Ideas and Wants: 4. Without difficulty (complex and basic) - expresses complex messages without difficulty and with speech that is clear and easy to understand  Understanding Verbal and Non-Verbal Content Understanding  Verbal and Non-Verbal Content: 4. Understands (complex and basic) - clear comprehension without cues or repetitions   Memory/Recall Ability Memory/Recall Ability : Current season;That he or she is in a hospital/hospital unit   Refer to Care Plan for Long Term Goals  SHORT TERM GOAL WEEK 1 OT Short Term Goal 1 (Week 1): Pt will complete sitting EOB with Mod A to complete ADL task OT Short Term Goal 2 (Week 1): Pt will complete LB dressing with Max A OT Short Term Goal 3 (Week 1): Pt will complete UB dressing with Mod A  Recommendations for other services: Therapeutic Recreation  Pet therapy and Outing/community reintegration   Skilled Therapeutic Intervention ADL ADL Eating: Set up Where Assessed-Eating: Bed level Grooming: Supervision/safety Where Assessed-Grooming: Sitting at sink;Wheelchair Upper Body Bathing: Minimal assistance Where Assessed-Upper Body Bathing: Bed level Lower Body Bathing: Maximal assistance Where Assessed-Lower Body Bathing: Bed level Upper Body Dressing: Moderate assistance Where Assessed-Upper Body Dressing: Bed level Lower Body Dressing: Dependent Where Assessed-Lower Body Dressing: Bed level Toileting: Not assessed (foley) Where Assessed-Toileting: Bed level Toilet Transfer: Not assessed (safety concerns) Toilet Transfer Method: Unable to assess Tub/Shower Transfer: Unable to assess Tub/Shower Transfer Method: Unable to assess Film/video editor: Unable to assess Film/video editor Method: Unable to assess ADL Comments: ADLs completed bed level d/t decreased trunk support and increased independence with bed level activities Mobility  Bed Mobility Bed Mobility: Rolling Right;Rolling Left;Supine to Sit Rolling Right: Moderate Assistance - Patient 50-74% (using bed rails) Rolling Left: Moderate Assistance - Patient 50-74% (using bed rails) Supine to Sit: Maximal Assistance - Patient - Patient 25-49% (HOB raised, using rail tp pull)   1:1  evaluation and treatment session initiated this date. OT roles, goals and purpose discussed with pt as well as therapy schedule. ADL completed this date with levels of assist listed above. Pt completed ADL tasks at bed level requiring levels of assistance above. Pt rolling side to side for pants management using bed rails. Pt completed EOB> W/C transfers fr transfer board with totla A. Pt educated on sheering with transfer board. Pt educated  on AD. Pt educated on pressure relief. Would benefit from more education on topics. Pt would benefit from skilled OT in IPR setting in order to maximize independence with ADLs upon D/C.    Session 2 General: "This is my daughter!" Pt supine in bed upon OT arrival, agreeable to OT session. Daughter present for session.  Pain: no pain reported  ADL: OT donned leg loops total A with demonstration for pt to initiate donning/doffing. Pt completed 3x10 managing legs in figure 4 formation in order to prepare for LB dressing. OT completed leg stretches in order to decrease muscle tightness in legs for preparation of ADLs.   Other Treatments: Pt, pt daughter and OT discussed potential home modifications and DME required for safe D/C home. OT discussed therapy progress throughout next few weeks in order to determine det equipment. OT discussed potentially receiving hospital bed for increased independence with ADLs completing at bed level. Pt and OT discussed potential use for custom fit W/C for homegoing. OT discussed bathing options with pt and daughter.    Pt supine in bed with bed alarm activated, 2 bed rails up, call light within reach and 4Ps assessed.   Discharge Criteria: Patient will be discharged from OT if patient refuses treatment 3 consecutive times without medical reason, if treatment goals not met, if there is a change in medical status, if patient makes no progress towards goals or if patient is discharged from hospital.  The above assessment, treatment  plan, treatment alternatives and goals were discussed and mutually agreed upon: by patient  [name], OTD, OTR/L 12/13/2022, 4:06 PM

## 2022-12-13 NOTE — Progress Notes (Signed)
PROGRESS NOTE   Subjective/Complaints:  Pt doing well today, but endorses not sleeping well for the last 2 nights. Doesn't usually take anything at home for sleep. Melatonin 5mg  last night didn't help much but agreeable with increasing dose to see if it helps.  Denies pain currently, but when she has pain, tylenol helps some.  LBM yesterday per her report. Has a foley, no issues with it.  Denies any other complaints or concerns today. Specifically also denies any SOB, dizziness/lightheadedness, or other symptoms of anemia. Doesn't know if she has a hx of anemia or not.   ROS: as per HPI. Denies lightheadedness, CP, SOB, abd pain, N/V/D/C, or any other complaints at this time.    Objective:   No results found. Recent Labs    12/11/22 0515 12/12/22 0352  WBC 7.0 7.1  HGB 7.3* 7.0*  HCT 23.5* 21.9*  PLT 154 189   Recent Labs    12/12/22 0352 12/12/22 1930  NA 134* 133*  K 4.1 4.6  CL 98 101  CO2 24 23  GLUCOSE 101* 105*  BUN 29* 32*  CREATININE 1.45* 1.31*  CALCIUM 8.7* 8.1*    Urinalysis No results found for: "COLORURINE", "APPEARANCEUR", "LABSPEC", "PHURINE", "GLUCOSEU", "HGBUR", "BILIRUBINUR", "KETONESUR", "PROTEINUR", "UROBILINOGEN", "NITRITE", "LEUKOCYTESUR"    Intake/Output Summary (Last 24 hours) at 12/13/2022 1104 Last data filed at 12/13/2022 0700 Gross per 24 hour  Intake 620 ml  Output 200 ml  Net 420 ml        Physical Exam: Vital Signs Blood pressure (!) 149/56, pulse 61, temperature (!) 95.9 F (35.5 C), temperature source Oral, resp. rate 18, height 5' (1.524 m), weight 50.1 kg, SpO2 99%.  General: No apparent distress, a little pale, sitting in w/c HEENT: Head is normocephalic, atraumatic, sclera anicteric but slightly pale, oral mucosa pink and moist, dentition decreased Heart: Reg rate and ?irregularly irregular rhythm. No m/r/g appreciated Chest: CTA bilaterally , nonlabored breathing  on 2 L nasal cannula Abdomen: Soft, non-tender, non-distended, bowel sounds positive. Extremities: No clubbing, cyanosis, or edema. Pulses are 2+ Psych: Pt's affect is appropriate. Pt is cooperative GU: Foley in place draining yellow urine  PRIOR EXAMS: Skin: Groin incision site looks okay, bruising noted bilateral shins 2 IVs in place left upper extremity looks okay  Neuro:   Mental Status: AAOx3, memory intact,  Speech/Languate:  fluent, follows simple commands CRANIAL NERVES: II: PERRL. Visual fields full III, IV, VI: EOM intact, no gaze preference or deviation V: normal sensation bilaterally VII: no asymmetry VIII: normal hearing to speech IX, X: normal palatal elevation XI: 5/5 head turn and 5/5 shoulder shrug bilaterally XII: Tongue midline     MOTOR: RUE: 5/5 Deltoid, 5/5 Biceps, 5/5 Triceps,5/5 Grip LUE: 5/5 Deltoid, 5/5 Biceps, 5/5 Triceps, 5/5 Grip RLE: 0 out of 5 throughout LLE: 0 out of 5 throughout   SENSORY: Normal to touch all 4 extremities to light touch in bilateral upper and lower extremities, patchy altered sensation to cold in lower extremities   No ankle clonus bilaterally   Coordination: Normal finger to nose, no dysmetria  Assessment/Plan: 1. Functional deficits which require 3+ hours per day of interdisciplinary therapy in a comprehensive  inpatient rehab setting. Physiatrist is providing close team supervision and 24 hour management of active medical problems listed below. Physiatrist and rehab team continue to assess barriers to discharge/monitor patient progress toward functional and medical goals  Care Tool:  Bathing    Body parts bathed by patient: Right arm, Left arm, Chest, Abdomen, Front perineal area, Face   Body parts bathed by helper: Buttocks, Right upper leg, Left upper leg, Right lower leg, Left lower leg     Bathing assist Assist Level: Maximal Assistance - Patient 24 - 49% (bed level)     Upper Body  Dressing/Undressing Upper body dressing   What is the patient wearing?: Pull over shirt    Upper body assist Assist Level: Maximal Assistance - Patient 25 - 49%    Lower Body Dressing/Undressing Lower body dressing            Lower body assist Assist for lower body dressing: Dependent - Patient 0%     Toileting Toileting    Toileting assist Assist for toileting: 2 Helpers (bed level)     Transfers Chair/bed transfer  Transfers assist     Chair/bed transfer assist level: Total Assistance - Patient < 25% (transfer board)     Locomotion Ambulation   Ambulation assist              Walk 10 feet activity   Assist           Walk 50 feet activity   Assist           Walk 150 feet activity   Assist           Walk 10 feet on uneven surface  activity   Assist           Wheelchair     Assist               Wheelchair 50 feet with 2 turns activity    Assist            Wheelchair 150 feet activity     Assist          Blood pressure (!) 149/56, pulse 61, temperature (!) 95.9 F (35.5 C), temperature source Oral, resp. rate 18, height 5' (1.524 m), weight 50.1 kg, SpO2 99%.  Medical Problem List and Plan: 1. 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   2.  Antithrombotics: -DVT/anticoagulation:  Pharmaceutical: Eliquis 2.5mg  BID             -antiplatelet therapy: Aspirin 81 mg daily   3. Pain Management: Tylenol, robaxin, oxycodone as needed   4. Mood/Behavior/Sleep: LCSW to evaluate and provide emotional support             -continue paroxetine 20 mg daily (hx of depression)             -antipsychotic agents: n/a -12/13/22 hasn't been sleeping well, increase melatonin to 10mg  QHS, added Trazodone 25mg  PRN but advised to be cautious about  using it too late.    5. Neuropsych/cognition: This patient is capable of making decisions on her own behalf.   6. Skin/Wound Care: Routine skin care checks   7. Fluids/Electrolytes/Nutrition: Routine Is and Os  and follow-up chemistries   8: Hypertension: monitor TID and prn (hx of a.fib see meds below). Monitor with mobility. Continue metoprolol 25mg  BID  -12/13/22 BPs a little up, monitor for trend  Vitals:   12/12/22 1838 12/12/22 1904 12/13/22 0254  BP: (!) 166/52 (!) 156/51 (!) 149/56                 9: Hyperlipidemia: continue pravastatin 40mg  daily   10: COPD, O2 dependent 2L via :             -continue Brovana nebs BID             -continue Yupelri neb daily   11: CAD: stable on asa and statin   12: Atrial fibrillation, paroxysmal: maintained on Eliquis reversed pre-operatively             -continue amiodarone 100 mg daily             -continue metoprolol tartrate 25 mg BID             -cleared by [facility] to re-start Eliquis>pharmacy consult placed   13: 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   14: 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)   15: GERD: continue protonix 40mg  daily   16: Urinary retention, Likely neurogenic bladder: Foley in place             -Consider voiding trial tomorrow/May need scheduled IC -12/13/22 discussed likely voiding trial this week, will defer to weekday team to also allow patient to focus on evals today   17: ABLA: stable; follow CBC trend             -consider oral iron supplement -12/13/22 Hgb slowly downtrending from postop 8.8 on 10/14>7.6>7.5>7.3>7.0 yesterday; pt asymptomatic at this time; would recheck tomorrow but if still at the 7.0-range then would consider transfusion to help with therapy tolerance; unclear what her prior baseline was (has hgb 12.2 back in July but then down to 9's).    18: AKI: improving; CMP 12/12/22 showing Cr 1.31; monitor Monday BMPs   19: Likely neurogenic bowel: bowel program ordered -Dulcolax  suppository ordered -12/13/22 per pt LBM yesterday, monitor  I spent >69mins performing patient care related activities, including face to face time, documentation time, review of chart and meds, med management, discussion of meds/results with patient , and overall coordination of care.     LOS: 1 days A FACE TO FACE EVALUATION WAS PERFORMED  [address] 12/13/2022, 11:04 AM

## 2022-12-13 NOTE — Plan of Care (Signed)
  Problem: RH Balance Goal: LTG: Patient will maintain dynamic sitting balance (OT) Description: LTG:  Patient will maintain dynamic sitting balance with assistance during activities of daily living (OT) Flowsheets (Taken 12/13/2022 1228) LTG: Pt will maintain dynamic sitting balance during ADLs with: Contact Guard/Touching assist   Problem: RH Eating Goal: LTG Patient will perform eating w/assist, cues/equip (OT) Description: LTG: Patient will perform eating with assist, with/without cues using equipment (OT) Flowsheets (Taken 12/13/2022 1228) LTG: Pt will perform eating with assistance level of: Set up assist    Problem: RH Grooming Goal: LTG Patient will perform grooming w/assist,cues/equip (OT) Description: LTG: Patient will perform grooming with assist, with/without cues using equipment (OT) Flowsheets (Taken 12/13/2022 1228) LTG: Pt will perform grooming with assistance level of: Set up assist    Problem: RH Bathing Goal: LTG Patient will bathe all body parts with assist levels (OT) Description: LTG: Patient will bathe all body parts with assist levels (OT) Flowsheets (Taken 12/13/2022 1228) LTG: Pt will perform bathing with assistance level/cueing: Supervision/Verbal cueing LTG: Position pt will perform bathing: Supine in bed   Problem: RH Dressing Goal: LTG Patient will perform upper body dressing (OT) Description: LTG Patient will perform upper body dressing with assist, with/without cues (OT). Flowsheets (Taken 12/13/2022 1228) LTG: Pt will perform upper body dressing with assistance level of: Supervision/Verbal cueing Goal: LTG Patient will perform lower body dressing w/assist (OT) Description: LTG: Patient will perform lower body dressing with assist, with/without cues in positioning using equipment (OT) Flowsheets (Taken 12/13/2022 1228) LTG: Pt will perform lower body dressing with assistance level of: Minimal Assistance - Patient > 75%   Problem: RH Toileting Goal:  LTG Patient will perform toileting task (3/3 steps) with assistance level (OT) Description: LTG: Patient will perform toileting task (3/3 steps) with assistance level (OT)  Flowsheets (Taken 12/13/2022 1228) LTG: Pt will perform toileting task (3/3 steps) with assistance level: Moderate Assistance - Patient 50 - 74%   Problem: RH Toilet Transfers Goal: LTG Patient will perform toilet transfers w/assist (OT) Description: LTG: Patient will perform toilet transfers with assist, with/without cues using equipment (OT) Flowsheets (Taken 12/13/2022 1228) LTG: Pt will perform toilet transfers with assistance level of: Minimal Assistance - Patient > 75%

## 2022-12-13 NOTE — Progress Notes (Signed)
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

## 2022-12-13 NOTE — Progress Notes (Signed)
Signed     Expand All Collapse All PMR Admission Coordinator Pre-Admission Assessment   Patient: Heather Molina is an 82 y.o., female MRN: [id number] DOB: 1940/08/15 Height: 5' (152.4 cm) Weight: 50.6 kg   Insurance Information HMO:     PPO:      PCP:      IPA:      80/20: yes     OTHER:                PRIMARY: Medicare A and B       Policy#: [id number]     Phone#: Verified online    Fax#:  Pre-Cert#:       Employer:  Benefits:  Phone #:      Name:  Eff. Date: Parts A  and B effective 03/26/2004 Deduct: $1632      Out of Pocket Max:  None      Life Max: N/A  CIR: 100%      SNF: 100 days Outpatient: 80%     Co-Pay: 20% Home Health: 100%      Co-Pay: none DME: 80%     Co-Pay: 20% Providers: patient's choice  SECONDARY: AARP     Policy#: [id number]      Phone#: [phone]   Financial Counselor:       Phone#:    The "Data Collection Information Summary" for patients in Inpatient Rehabilitation Facilities with attached "Privacy Act Statement-Health Care Records" was provided and verbally reviewed with: Patient   Emergency Contact Information Contact Information       Name Relation Home Work Mobile    Deloit Daughter     [phone]         Other Contacts   None on File        Current Medical History  Patient Admitting Diagnosis: Spinal Cord Infarct, BLE weakness s/p TEVAR   History of Present Illness: Pt. is a 82 y.o. female with a history of COPD on home oxygen, HTN, CAD, and A-fib on amiodarone and Eliquis who transferred to [facility] from [facility] 11/27/22.  Vascular and Vein Specailsts at  [facility] were contacted by [facility] due to CT with acute IMH and possible active bleed.  She was started on esmolol and transferred to [facility]. She presented to the emergency department in [place] with acute onset back and chest pain. She describes it as a sharp pain that progressed into a tightness feeling. 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  There are multiple atherosclerotic lesions and tortuosity throughout her thoracic aorta.  There are multiple areas of contour changes in the lumen that appear to be areas of bleeding into the wall of the aorta. Pt. Underwent TEVAR with coverage of LSA and Right femoral endarterectomy and patch angioplasty  on 12/07/22 with [facility]. Post-op, Pt. Was unable to move her legs. 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 Pt. Seen by PT/OT and they recommend CIR to assist return to PLOF.      Patient's medical record from [facility] has been reviewed by the rehabilitation admission coordinator and physician.   Past Medical History      Past Medical History:  Diagnosis Date   Acute foot pain, right 04/15/2022   Acute on chronic respiratory failure with hypoxia (HCC) 08/30/2022  Age-related osteoporosis without current pathological fracture 04/06/2018   ALLERGIC RHINITIS 05/20/2009    Qualifier: Diagnosis of   By: Excell Seltzer CMA, Lawson Fiscal      Replacing diagnoses that were inactivated after the 05/25/22 regulatory import   Anxiety     Arthritis, multiple joint involvement 04/06/2018   C O P D 05/20/2009    Qualifier: Diagnosis of   By: Excell Seltzer CMA, Lori      Replacing diagnoses that were inactivated after the 05/25/22 regulatory import   CAD (coronary artery disease) 06/29/2022   Calculus of gallbladder with chronic cholecystitis without obstruction 04/30/2016   CAP (community acquired pneumonia) 05/13/2019   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/26/2020   Cigarette nicotine dependence without complication 12/26/2020   COPD with acute exacerbation (HCC) 08/30/2022   Elevated brain natriuretic peptide (BNP) level 08/30/2022   G E R D 05/20/2009    Qualifier: Diagnosis of   By:  Excell Seltzer CMA, Lori       Generalized edema 08/08/2019   History of CVA (cerebrovascular accident) 12/26/2020   Hypertension     Hypoxia 05/13/2019   Irritable bowel syndrome with diarrhea 09/27/2019   Leukocytosis 05/13/2019   Lipoma of torso 04/07/2019   Mixed dyslipidemia 04/06/2018   NSVT (nonsustained ventricular tachycardia) (HCC) 05/13/2019   Paroxysmal atrial fibrillation (HCC) 06/29/2022   Prolonged Q-T interval on ECG 05/13/2019   Recurrent major depressive disorder, in full remission (HCC) 04/06/2018   Respiratory infection 04/15/2022   Right inguinal hernia 03/02/2018   Sebaceous cyst 04/07/2019   TOBACCO ABUSE 05/21/2009    Qualifier: Diagnosis of   By: Craige Cotta MD, Vineet              Has the patient had major surgery during 100 days prior to admission? Yes   Family History   family history includes CVA in her father; Epilepsy in her father.   Current Medications  Current Medications    Current Facility-Administered Medications:    acetaminophen (TYLENOL) tablet 325-650 mg, 325-650 mg, Oral, Q4H PRN, 650 mg at 12/10/22 2326 **OR** acetaminophen (TYLENOL) suppository 325-650 mg, 325-650 mg, Rectal, Q4H PRN, Baglia, Corrina, PA-C   alum & mag hydroxide-simeth (MAALOX/MYLANTA) 200-200-20 MG/5ML suspension 15-30 mL, 15-30 mL, Oral, Q2H PRN, Baglia, Corrina, PA-C   amiodarone (PACERONE) tablet 100 mg, 100 mg, Oral, Daily, Baglia, Corrina, PA-C, 100 mg at 12/11/22 0916   arformoterol (BROVANA) nebulizer solution 15 mcg, 15 mcg, Nebulization, BID, Steffanie Dunn, DO, 15 mcg at 12/11/22 8119   aspirin EC tablet 81 mg, 81 mg, Oral, Daily, Baglia, Corrina, PA-C, 81 mg at 12/11/22 0916   bisacodyl (DULCOLAX) EC tablet 5 mg, 5 mg, Oral, Daily PRN, Baglia, Corrina, PA-C   Chlorhexidine Gluconate Cloth 2 % PADS 6 each, 6 each, Topical, Daily, Daria Pastures, MD, 6 each at 12/11/22 0918   guaiFENesin-dextromethorphan (ROBITUSSIN DM) 100-10 MG/5ML syrup 15 mL, 15 mL, Oral, Q4H PRN,  Baglia, Corrina, PA-C   heparin injection 5,000 Units, 5,000 Units, Subcutaneous, Q8H, Baglia, Corrina, PA-C, 5,000 Units at 12/11/22 1478   hydrALAZINE (APRESOLINE) injection 5 mg, 5 mg, Intravenous, Q20 Min PRN, Baglia, Corrina, PA-C, 5 mg at 12/10/22 0339   HYDROmorphone (DILAUDID) injection 0.5-1 mg, 0.5-1 mg, Intravenous, Q2H PRN, Baglia, Corrina, PA-C   insulin aspart (novoLOG) injection 0-9 Units, 0-9 Units, Subcutaneous, TID WC, Steffanie Dunn, DO, 2 Units at 12/10/22 1145   levalbuterol (XOPENEX) nebulizer solution 0.63 mg, 0.63 mg, Nebulization, Q3H PRN, Lidia Collum, PA-C  magnesium sulfate IVPB 2 g 50 mL, 2 g, Intravenous, Daily PRN, Baglia, Corrina, PA-C   metoprolol tartrate (LOPRESSOR) injection 2-5 mg, 2-5 mg, Intravenous, Q2H PRN, Baglia, Corrina, PA-C   metoprolol tartrate (LOPRESSOR) tablet 25 mg, 25 mg, Oral, BID, Lidia Collum, PA-C, 25 mg at 12/11/22 5284   norepinephrine (LEVOPHED) 4mg  in (0.016 mg/mL) premix infusion, 0-40 mcg/min, Intravenous, Titrated, Randon Goldsmith, CRNA, Stopped at 12/07/22 1615   oxyCODONE (Oxy IR/ROXICODONE) immediate release tablet 5-10 mg, 5-10 mg, Oral, Q4H PRN, Baglia, Corrina, PA-C   pantoprazole (PROTONIX) EC tablet 40 mg, 40 mg, Oral, Daily, Baglia, Corrina, PA-C, 40 mg at 12/11/22 0916   PARoxetine (PAXIL) tablet 20 mg, 20 mg, Oral, Daily, Baglia, Corrina, PA-C, 20 mg at 12/11/22 0916   phenol (CHLORASEPTIC) mouth spray 1 spray, 1 spray, Mouth/Throat, PRN, Baglia, Corrina, PA-C   potassium chloride SA (KLOR-CON M) CR tablet 20-40 mEq, 20-40 mEq, Oral, Daily PRN, Baglia, Corrina, PA-C   pravastatin (PRAVACHOL) tablet 40 mg, 40 mg, Oral, Daily, Baglia, Corrina, PA-C, 40 mg at 12/11/22 1324   revefenacin (YUPELRI) nebulizer solution 175 mcg, 175 mcg, Nebulization, Daily, Steffanie Dunn, DO, 175 mcg at 12/11/22 0803   senna-docusate (Senokot-S) tablet 1 tablet, 1 tablet, Oral, QHS PRN, Baglia, Corrina, PA-C     Patients Current Diet:   Diet Order                  Diet Heart Room service appropriate? Yes; Fluid consistency: Thin  Diet effective now                         Precautions / Restrictions Precautions Precautions: Fall Precaution Comments: incomplete paraplegia Restrictions Weight Bearing Restrictions: No    Has the patient had 2 or more falls or a fall with injury in the past year? No   Prior Activity Level Limited Community (1-2x/wk): Pt. went out for appointments   Prior Functional Level Self Care: Did the patient need help bathing, dressing, using the toilet or eating? Independent   Indoor Mobility: Did the patient need assistance with walking from room to room (with or without device)? Independent   Stairs: Did the patient need assistance with internal or external stairs (with or without device)? Independent   Functional Cognition: Did the patient need help planning regular tasks such as shopping or remembering to take medications? Needed some help   Patient Information Are you of Hispanic, Latino/a,or Spanish origin?: A. No, not of Hispanic, Latino/a, or Spanish origin What is your race?: A. White Do you need or want an interpreter to communicate with a doctor or health care staff?: 2. No   Patient's Response To:  Health Literacy and Transportation Is the patient able to respond to health literacy and transportation needs?: Yes Health Literacy - How often do you need to have someone help you when you read instructions, pamphlets, or other written material from your doctor or pharmacy?: Never In the past 12 months, has lack of transportation kept you from medical appointments or from getting medications?: No In the past 12 months, has lack of transportation kept you from meetings, work, or from getting things needed for daily living?: No   Journalist, newspaper / Equipment Home Equipment: Shower seat, Agricultural consultant (2 wheels), [employer] - single point   Prior Device Use: Indicate  devices/aids used by the patient prior to current illness, exacerbation or injury? None of the above   Current Functional Level Cognition  Overall Cognitive Status: Impaired/Different from baseline Orientation Level: Oriented X4 Safety/Judgement: Decreased awareness of deficits General Comments: decreased awareness of balance deficits and need to correct    Extremity Assessment (includes Sensation/Coordination)   Upper Extremity Assessment: Generalized weakness  Lower Extremity Assessment: Defer to PT evaluation     ADLs   Overall ADL's : Needs assistance/impaired Eating/Feeding: Bed level, Independent Grooming: Oral care, Moderate assistance, Maximal assistance, Sitting Grooming Details (indicate cue type and reason): for sitting balance. Upper Body Bathing: Sitting, Moderate assistance Lower Body Bathing: Total assistance Upper Body Dressing : Maximal assistance, Sitting Lower Body Dressing: Total assistance, Sitting/lateral leans Toilet Transfer: Maximal assistance, +2 for physical assistance, +2 for safety/equipment Toilet Transfer Details (indicate cue type and reason): lateral scoot simulated to chair Functional mobility during ADLs: Maximal assistance, +2 for physical assistance, +2 for safety/equipment     Mobility   Overal bed mobility: Needs Assistance Bed Mobility: Rolling, Sidelying to Sit Rolling: Mod assist, Used rails Sidelying to sit: Mod assist Sit to supine: Max assist General bed mobility comments: pt cued to use pad under thigh to lift thigh for bending Rt knee with therapist moving LLE. pt able to reach for rail and roll upper body then utilize UB to move legs off bed with mod assist to elevate trunk to sitting.     Transfers   Overall transfer level: Needs assistance Equipment used:  (lateral scoot with bed pad) Transfers: Bed to chair/wheelchair/BSC Bed to/from chair/wheelchair/BSC transfer type:: Lateral/scoot transfer  Lateral/Scoot Transfers: Max  assist, +2 physical assistance General transfer comment: max assist with +2 for safety for seqeuntial lateral scoot to drop arm chair with use of pad under sacrum. Max +2 to scoot back in chair     Ambulation / Gait / Stairs / Wheelchair Mobility   Ambulation/Gait General Gait Details: unable     Posture / Balance Dynamic Sitting Balance Sitting balance - Comments: mod assist for sitting balance with cues for midline and UB support on surface. With UB support pt able to maintain midline grossly 20 sec at a time with max assist to recover with LOB both posteriorly and anteriorly. Pt fatigues with back pain after 7 min EOB. Balance Overall balance assessment: Needs assistance Sitting-balance support: Feet unsupported, Bilateral upper extremity supported Sitting balance-Leahy Scale: Poor Sitting balance - Comments: mod assist for sitting balance with cues for midline and UB support on surface. With UB support pt able to maintain midline grossly 20 sec at a time with max assist to recover with LOB both posteriorly and anteriorly. Pt fatigues with back pain after 7 min EOB. Postural control: Posterior lean     Special needs/care consideration Skin surgical incision    Previous Home Environment (from acute therapy documentation) Living Arrangements: Children  Lives With: Daughter Available Help at Discharge: Family Type of Home: House Home Layout: One level Home Access: Ramped entrance, Stairs to enter [employer] of Steps: 3 or 4 short steps Bathroom Shower/Tub: Health visitor: Standard Bathroom Accessibility: Yes How Accessible: Accessible via wheelchair, Accessible via walker Home Care Services: Yes   Discharge Living Setting Plans for Discharge Living Setting: Patient's home Type of Home at Discharge: House Discharge Home Layout: One level Discharge Home Access: Ramped entrance Discharge Bathroom Shower/Tub: Tub/shower unit Discharge Bathroom Toilet:  Standard Discharge Bathroom Accessibility: Yes How Accessible: Accessible via walker   Social/Family/Support Systems Patient Roles: Other (Comment) Contact Information: [phone] Anticipated Caregiver: Philomena Doheny Caregiver Availability: 24/7 Discharge Plan Discussed with Primary Caregiver: Yes  Is Caregiver In Agreement with Plan?: Yes Does Caregiver/Family have Issues with Lodging/Transportation while Pt is in Rehab?: No   Goals Patient/Family Goal for Rehab: PT/OT mod A, wheelchair level Expected length of stay: 18-21 days Pt/Family Agrees to Admission and willing to participate: Yes Program Orientation Provided & Reviewed with Pt/Caregiver Including Roles  & Responsibilities: Yes   Decrease burden of Care through IP rehab admission: not anticipated   Possible need for SNF placement upon discharge: not anticipated   Patient Condition: I have reviewed medical records from [facility], spoken with CM, and patient and daughter. I met with patient at the bedside for inpatient rehabilitation assessment.  Patient will benefit from ongoing PT and OT, can actively participate in 3 hours of therapy a day 5 days of the week, and can make measurable gains during the admission.  Patient will also benefit from the coordinated team approach during an Inpatient Acute Rehabilitation admission.  The patient will receive intensive therapy as well as Rehabilitation physician, nursing, social worker, and care management interventions.  Due to bladder management, bowel management, safety, skin/wound care, disease management, medication administration, pain management, and patient education the patient requires 24 hour a day rehabilitation nursing.  The patient is currently mod to max+2 with mobility and max to total assist with basic ADLs.  Discharge setting and therapy post discharge at home with home health is anticipated.  Patient has agreed to participate in the Acute Inpatient  Rehabilitation Program and will admit tomorrow, Saturday.   Preadmission Screen Completed By:  Trish Mage, 12/11/2022 10:06 AM ______________________________________________________________________   Discussed status with Dr. Riley Kill  on 12/11/22 at 0945 and received approval for admission tomorrow, Saturday.   Admission Coordinator:  Trish Mage, RN, time 1009/Date 12/11/22    Assessment/Plan: Diagnosis: Does the need for close, 24 hr/day Medical supervision in concert with the patient's rehab needs make it unreasonable for this patient to be served in a less intensive setting? Yes Co-Morbidities requiring supervision/potential complications: COPD, Afib, S/P TEVAR, GERD, AKI, likely neurogenic bowel, hx of CVA, urinary retention, ABLA  Due to bladder management, bowel management, safety, skin/wound care, disease management, medication administration, pain management, and patient education, does the patient require 24 hr/day rehab nursing? Yes Does the patient require coordinated care of a physician, rehab nurse, PT, OT, and SLP to address physical and functional deficits in the context of the above medical diagnosis(es)? Yes Addressing deficits in the following areas: balance, endurance, locomotion, strength, transferring, bowel/bladder control, bathing, dressing, feeding, grooming, toileting, cognition, and psychosocial support Can the patient actively participate in an intensive therapy program of at least 3 hrs of therapy 5 days a week? Yes The potential for patient to make measurable gains while on inpatient rehab is excellent Anticipated functional outcomes upon discharge from inpatient rehab: mod assist PT, mod assist OT, n/a SLP Estimated rehab length of stay to reach the above functional goals is: 18-21 Anticipated discharge destination: Home 10. Overall Rehab/Functional Prognosis: good     MD Signature: Fanny Dance

## 2022-12-14 LAB — CBC WITH DIFFERENTIAL/PLATELET
Abs Immature Granulocytes: 0.06 10*3/uL (ref 0.00–0.07)
Basophils Absolute: 0.1 10*3/uL (ref 0.0–0.1)
Basophils Relative: 1 %
Eosinophils Absolute: 0.2 10*3/uL (ref 0.0–0.5)
Eosinophils Relative: 3 %
HCT: 23.7 % — ABNORMAL LOW (ref 36.0–46.0)
Hemoglobin: 7.4 g/dL — ABNORMAL LOW (ref 12.0–15.0)
Immature Granulocytes: 1 %
Lymphocytes Relative: 10 %
Lymphs Abs: 0.7 10*3/uL (ref 0.7–4.0)
MCH: 26.2 pg (ref 26.0–34.0)
MCHC: 31.2 g/dL (ref 30.0–36.0)
MCV: 84 fL (ref 80.0–100.0)
Monocytes Absolute: 1.1 10*3/uL — ABNORMAL HIGH (ref 0.1–1.0)
Monocytes Relative: 15 %
Neutro Abs: 5.1 10*3/uL (ref 1.7–7.7)
Neutrophils Relative %: 70 %
Platelets: 292 10*3/uL (ref 150–400)
RBC: 2.82 MIL/uL — ABNORMAL LOW (ref 3.87–5.11)
RDW: 16.4 % — ABNORMAL HIGH (ref 11.5–15.5)
WBC: 7.2 10*3/uL (ref 4.0–10.5)
nRBC: 0 % (ref 0.0–0.2)

## 2022-12-14 LAB — URINALYSIS, ROUTINE W REFLEX MICROSCOPIC
Bilirubin Urine: NEGATIVE
Glucose, UA: NEGATIVE mg/dL
Hgb urine dipstick: NEGATIVE
Ketones, ur: NEGATIVE mg/dL
Leukocytes,Ua: NEGATIVE
Nitrite: NEGATIVE
Protein, ur: NEGATIVE mg/dL
Specific Gravity, Urine: 1.013 (ref 1.005–1.030)
pH: 5 (ref 5.0–8.0)

## 2022-12-14 LAB — BASIC METABOLIC PANEL
Anion gap: 10 (ref 5–15)
BUN: 25 mg/dL — ABNORMAL HIGH (ref 8–23)
CO2: 27 mmol/L (ref 22–32)
Calcium: 8.5 mg/dL — ABNORMAL LOW (ref 8.9–10.3)
Chloride: 94 mmol/L — ABNORMAL LOW (ref 98–111)
Creatinine, Ser: 1.09 mg/dL — ABNORMAL HIGH (ref 0.44–1.00)
GFR, Estimated: 50 mL/min — ABNORMAL LOW (ref >60)
Glucose, Bld: 100 mg/dL — ABNORMAL HIGH (ref 70–99)
Potassium: 4.2 mmol/L (ref 3.5–5.1)
Sodium: 131 mmol/L — ABNORMAL LOW (ref 135–145)

## 2022-12-14 MED ORDER — SODIUM CHLORIDE 1 G PO TABS
1.0000 g | ORAL_TABLET | Freq: Three times a day (TID) | ORAL | Status: DC
  Administered 2022-12-14 – 2022-12-18 (×12): 1 g via ORAL
  Filled 2022-12-14 (×13): qty 1

## 2022-12-14 MED ORDER — TEMAZEPAM 7.5 MG PO CAPS
7.5000 mg | ORAL_CAPSULE | Freq: Every day | ORAL | Status: DC
  Administered 2022-12-14 – 2022-12-15 (×2): 7.5 mg via ORAL
  Filled 2022-12-14 (×2): qty 1

## 2022-12-14 MED ORDER — LIDOCAINE HCL URETHRAL/MUCOSAL 2 % EX GEL
1.0000 | Freq: Four times a day (QID) | CUTANEOUS | Status: DC | PRN
  Administered 2022-12-20 – 2023-01-03 (×7): 1 via URETHRAL
  Filled 2022-12-14 (×9): qty 6

## 2022-12-14 MED ORDER — ENSURE ENLIVE PO LIQD
237.0000 mL | Freq: Two times a day (BID) | ORAL | Status: DC
  Administered 2022-12-14 – 2022-12-23 (×12): 237 mL via ORAL

## 2022-12-14 NOTE — Progress Notes (Signed)
Occupational Therapy Session Note  Patient Details  Name: Heather Molina MRN: [id number] Date of Birth: 1941/01/04  Today's Date: 12/14/2022 OT Individual Time: 0800-0900 & 1416-1534 OT Individual Time Calculation (min): 60 min & 78 min    Short Term Goals: Week 1:  OT Short Term Goal 1 (Week 1): Pt will complete sitting EOB with Mod A to complete ADL task OT Short Term Goal 2 (Week 1): Pt will complete LB dressing with Max A OT Short Term Goal 3 (Week 1): Pt will complete UB dressing with Mod A  Skilled Therapeutic Interventions/Progress Updates:      Therapy Documentation Precautions:  Precautions Precautions: Fall Precaution Comments: paraplegia Restrictions Weight Bearing Restrictions: No Session 1 General: "I am still tired today." Pt supine in bed upon OT arrival, agreeable to OT session. Nursing present for beginning of session. Vitals taken supine with BP of 88/55, donned TED hose, after recovery period of ~5 min BP 148/49, machine error speculated on first machine d/t repeated wrong readings. OT doffed TED hoe after higher reading and BP did not change. 2L O2 donned throughout session.  Pain:  unrated pain reported in low back, activity, intermittent rest breaks, distractions provided for pain management, pt reports tolerable to proceed.   ADL: Bed mobility: Mod A supine><EOB, assistance with hand over hand assist to manage LE with leg loops, able to go down on elbow to manage UE while OT manage LE EOB>supine Eating: set-up for finishing breakfast upon OT arrival UB dressing: Min A with assistance required to pull shirt down back, completed bed level, using bed rails to pull and manage shirt up/down back,  Footwear: total A for donning/doffing TED hose and socks  Balance: Pt seated EOB increasing ability to find center of gravity for increased trunk strength for dynamic seated ADL activities. Pt trialed propping and able to sit CGA with intermittent Min A with BUE support.     Pt supine in bed with bed alarm activated, 2 bed rails up, call light within reach and 4Ps assessed.  Session 2 General: "I am so sleepy" Pt supine in bed upon OT arrival, agreeable to OT session. 2L O2 donned throughout session.  Pain: no pain reported  ADL: Bed mobility: Mod A, assistance with managing BLE on/off of bed Transfers: Max A EOB><W/C with slight uphill W/C>air bed  Balance: Pt completed various activities in order to increase seated balance in order to complete ADLs such as oral hygiene. Activities listed below, completed with 1 arm support on W/C arm rest with CGA overall: -Pt seated at table in W/C retrieving items in placed anteriorly onto table out of BOS -Pt seated placing/retrieving squigz from vertical surface reaching out of BOS with resistance   Other Treatments: Pt demonstrated mobility with W/C in order to prepare for community integration at D/C. Pt propelled in W/C requiring PRN rest breaks d/t fatigue. Pt practiced maneuvering doorways, pulling into table tops, maneuvering around obstacles.    Pt supine in bed with bed alarm activated, 2 bed rails up, call light within reach and 4Ps assessed. Daughter present at end of session. Bed changed out into air bed   Therapy/Group: Individual Therapy  [name], OTD, OTR/L 12/14/2022, 4:27 PM

## 2022-12-14 NOTE — Progress Notes (Signed)
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 [facility])  Past Medical History:  Past Medical History:  Diagnosis Date   Acute foot pain, right 04/15/2022   Acute on chronic respiratory failure with hypoxia (HCC) 08/30/2022   Age-related osteoporosis without current pathological fracture 04/06/2018   ALLERGIC RHINITIS 05/20/2009   Qualifier: Diagnosis of   By: Excell Seltzer CMA, Lawson Fiscal      Replacing diagnoses that were inactivated after the 05/25/22 regulatory import   Anxiety    Arthritis, multiple joint involvement 04/06/2018   C O P D 05/20/2009   Qualifier: Diagnosis of   By: Excell Seltzer CMA, Lori      Replacing diagnoses that were inactivated after the 05/25/22 regulatory import   CAD (coronary artery disease) 06/29/2022   Calculus of gallbladder with chronic cholecystitis without obstruction 04/30/2016   CAP (community acquired pneumonia) 05/13/2019   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/26/2020   Cigarette nicotine dependence without complication 12/26/2020   COPD with acute exacerbation (HCC) 08/30/2022   Elevated brain natriuretic peptide (BNP) level 08/30/2022   G E R D 05/20/2009   Qualifier: Diagnosis of   By: Excell Seltzer CMA, Lori       Generalized edema 08/08/2019   History of CVA (cerebrovascular accident) 12/26/2020   Hypertension    Hypoxia 05/13/2019   Irritable bowel syndrome with diarrhea 09/27/2019   Leukocytosis 05/13/2019   Lipoma of torso 04/07/2019   Mixed dyslipidemia 04/06/2018   NSVT (nonsustained ventricular tachycardia) (HCC) 05/13/2019   Paroxysmal atrial fibrillation (HCC) 06/29/2022   Prolonged Q-T interval on ECG 05/13/2019   Recurrent major depressive disorder, in full remission (HCC) 04/06/2018   Respiratory infection  04/15/2022   Right inguinal hernia 03/02/2018   Sebaceous cyst 04/07/2019   TOBACCO ABUSE 05/21/2009   Qualifier: Diagnosis of   By: Craige Cotta MD, Vineet       Past Surgical History:  Past Surgical History:  Procedure Laterality Date   CHOLECYSTECTOMY     ENDARTERECTOMY FEMORAL Right 12/07/2022   Procedure: RIGHT FEMORAL ENDARTERECTOMY;  Surgeon: Daria Pastures, MD;  Location: [facility] OR;  Service: Vascular;  Laterality: Right;   HERNIA REPAIR     PATCH ANGIOPLASTY Right 12/07/2022   Procedure: RIGHT FEMORAL PATCH ANGIOPLASTY USING BOVINE PATCH;  Surgeon: Daria Pastures, MD;  Location: [facility] OR;  Service: Vascular;  Laterality: Right;   THORACIC AORTIC ENDOVASCULAR STENT GRAFT N/A 12/07/2022   Procedure: TEVAR;  Surgeon: Daria Pastures, MD;  Location: [facility] OR;  Service: Vascular;  Laterality: N/A;   ULTRASOUND GUIDANCE FOR VASCULAR ACCESS  12/07/2022   Procedure: ULTRASOUND GUIDANCE FOR VASCULAR ACCESS;  Surgeon: Daria Pastures, MD;  Location: [facility] OR;  Service: Vascular;;   Social History:  reports that she has quit smoking. She has never used smokeless tobacco. She reports that she does not currently use alcohol. She reports that she does not currently use drugs.  Family / Support Systems Marital Status: Widow/Widower Patient Roles: Parent, Other (Comment) (mother in-law) Children: [name] [phone] has another daughter Other Supports: Friends and church members Anticipated Caregiver: [name] and her husband Ability/Limitations of Caregiver: [name] is retired her husband works and her sister can assist some Caregiver Availability: 24/7 Family Dynamics: Lives in a in-law suite at [name]'s home she has lived there since 04/2022. She has good supports via  family and church members.  Social History Preferred language: English Religion:  Cultural Background: No issues Education: HS Health Literacy - How often do you need to have someone help you when you read instructions, pamphlets, or  other written material from your doctor or pharmacy?: Never Writes: Yes Employment Status: Retired Marine scientist Issues: No issues Guardian/Conservator: none-according to MD pt is capable of making her own decisions while here. [name] is very involved and will be here every other day to provide support.   Abuse/Neglect Abuse/Neglect Assessment Can Be Completed: Yes Physical Abuse: Denies Verbal Abuse: Denies Sexual Abuse: Denies Exploitation of patient/patient's resources: Denies Self-Neglect: Denies  Patient response to: Social Isolation - How often do you feel lonely or isolated from those around you?: Never  Emotional Status Pt's affect, behavior and adjustment status: Pt is motivated to do what she can for herself she is still adjusting to this and does not like it. She is one who has always been independent and take care of herself she is not one to ask for assist and was still adjusting to living with daughter and son in-law since march. Recent Psychosocial Issues: other health issues-required home O2 due to COPD and IBS along with other diagnosis Psychiatric History: History of anxiety takes medications for this and finds it helpful, but this has thrown her off and she is somewhat depressed. Have placed o neuro-psych list to be seen this week for coping. it is quite a shock to go from being independent to not being able to walk Substance Abuse History: Hx tobacco quit no other issues  Patient / Family Perceptions, Expectations & Goals Pt/Family understanding of illness & functional limitations: Pt and daughter can explain her stroke in in back and her issues. Both like to be kept updated on her medical issues and daughter when here ask questions. Both feel they understand her current issues and feel concerns and questions have been addressed. Premorbid pt/family roles/activities: mom, grandmother, retiree, church member, etc Anticipated changes in  roles/activities/participation: resume Pt/family expectations/goals: Pt states: " I want to do as much as I can for myself."  Daughter states: " I know she is very independent and this is depressing her."  [employer]: Other (Comment) Premorbid Home Care/DME Agencies: Other (Comment) ([facility] provides home O2, has tub seat, cane and rw. Had [facility] in the past for home health) Transportation available at discharge: Family Is the patient able to respond to transportation needs?: Yes In the past 12 months, has lack of transportation kept you from medical appointments or from getting medications?: No In the past 12 months, has lack of transportation kept you from meetings, work, or from getting things needed for daily living?: No Resource referrals recommended: Neuropsychology  Discharge Planning Living Arrangements: Children, Other relatives Support Systems: Children, Other relatives, Friends/neighbors, Church/faith community Type of Residence: Private residence Insurance Resources: [employer], Media planner (specify) Building services engineer) Financial Resources: Tree surgeon, Family Support Financial Screen Referred: No Living Expenses: Lives with family Money Management: Family Does the patient have any problems obtaining your medications?: No Home Management: self and daughter Patient/Family Preliminary Plans: Return home with daughter and son in-law she lives in an in-law suite and has some space for herself. Daughter and son in-law are putting up a ramp for her. [name] will be her primary caregiver and will be here to learn mom's care. Aware [facility] is 3-4 weeks here. 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 She is somewhat depressed which is understandable, placed on neuro-psych list to be seen  while here. Her daughter and son in-law is involved and will be assisting at discharge. Lives in in-law suite at daughter's home. Will work on discharge needs.   Lucy Chris 12/14/2022, 12:34 PM

## 2022-12-14 NOTE — Progress Notes (Addendum)
Attempted to meet with patient.  RT was in providing breathing tx but when I turned around patient was snoring. Will attempt to meet with her again.   Went to meet patient again.Marland Kitchenstill asleep.  Placed wound care plan on bathroom door.

## 2022-12-14 NOTE — Progress Notes (Signed)
Inpatient Rehabilitation Center Individual Statement of Services  Patient Name:  Heather Molina  Date:  12/14/2022  Welcome to the Inpatient Rehabilitation Center.  Our goal is to provide you with an individualized program based on your diagnosis and situation, designed to meet your specific needs.  With this comprehensive rehabilitation program, you will be expected to participate in at least 3 hours of rehabilitation therapies Monday-Friday, with modified therapy programming on the weekends.  Your rehabilitation program will include the following services:  Physical Therapy (PT), Occupational Therapy (OT), 24 hour per day rehabilitation nursing, Therapeutic Recreaction (TR), Neuropsychology, Care Coordinator, Rehabilitation Medicine, Nutrition Services, and Pharmacy Services  Weekly team conferences will be held on Tuesday to discuss your progress.  Your Inpatient Rehabilitation Care Coordinator will talk with you frequently to get your input and to update you on team discussions.  Team conferences with you and your family in attendance may also be held.  Expected length of stay: 3-4 weeks  Overall anticipated outcome: supervision-min wheelchair level  Depending on your progress and recovery, your program may change. Your Inpatient Rehabilitation Care Coordinator will coordinate services and will keep you informed of any changes. Your Inpatient Rehabilitation Care Coordinator's name and contact numbers are listed  below.  The following services may also be recommended but are not provided by the Inpatient Rehabilitation Center:  Driving Evaluations Home Health Rehabiltiation Services Outpatient Rehabilitation Services    Arrangements will be made to provide these services after discharge if needed.  Arrangements include referral to agencies that provide these services.  Your insurance has been verified to be:  Medicare & AARP Your primary doctor is:  Keturah Barre  Pertinent information  will be shared with your doctor and your insurance company.  Inpatient Rehabilitation Care Coordinator:  Dossie Der, [place] [phone] or Luna Glasgow  Information discussed with and copy given to patient by: Lucy Chris, 12/14/2022, 10:19 AM

## 2022-12-14 NOTE — Progress Notes (Signed)
Met with patient and family at bedside.  Air mattress in place, new wound care plan in room updated with instructions from WOC.  Oriented to rehab and team conference every Tuesday.  Discussed briefly bowel and bladder program, informed that will bring information in after conference tomorrow. Discussed new wound care orders and to keep heels off bed. Wears O2 at home and has O2 sensor at home that is connected to Ipad and has limit setting on 89% and is monitored 24/7 at home.  Discussed possibility of lower allowed limits but physician will make that decision. (+) COPD.  Reports that does remove when gets up to make a sandwich but can tell after a few minuets of not wearing O2. Foley removed today.  Will see if voids. All needs met, call bell in reach.

## 2022-12-14 NOTE — Discharge Summary (Signed)
Physician Discharge Summary  Patient ID: Heather Molina MRN: [id number] DOB/AGE: 07/20/1940 82 y.o.  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) Active Problems:   Moderate episode of recurrent major depressive disorder (HCC)   Junctional bradycardia   Pressure injury of skin Active problems: Functional deficits secondary to spinal cord ischemia Depression Hyperlipidemia COPD, O2 dependent History of coronary artery disease History of paroxysmal atrial fibrillation Status post TEVAR with spinal cord ischemia History of old CVA History of gastroesophageal reflux disease Urinary retention/neurogenic bladder Acute blood loss anemia Acute kidney injury Neurogenic bowel Bradycardia UTI Ulcer of esophagus with bleeding Congenital malformation of esophagus, unspecified Diaphragmatic hernia without obstruction or gangrene Gastric ulcer, unspecified as acute or chronic without hemorrhage or perforation Angiodysplasia of stomach and duodenum with bleeding Acute posthemorrhagic anemia Hemoccult positive stool  Discharged Condition: stable  Significant Diagnostic Studies:  Narrative & Impression  CLINICAL DATA:  Pulsatile abdominal mass   EXAM: CTA ABDOMEN AND PELVIS WITHOUT AND WITH CONTRAST   TECHNIQUE: Multidetector CT imaging of the abdomen and pelvis was performed using the standard protocol during bolus administration of intravenous contrast. Multiplanar reconstructed images and MIPs were obtained and reviewed to evaluate the vascular anatomy.   RADIATION DOSE REDUCTION: This exam was performed according to the departmental dose-optimization program which includes automated exposure control, adjustment of the mA and/or kV according to patient size and/or use of iterative reconstruction technique.   CONTRAST:  65mL OMNIPAQUE IOHEXOL 350 MG/ML SOLN    COMPARISON:  CT chest angiogram, 12/06/2022, CT abdomen pelvis, 03/04/2018   FINDINGS: VASCULAR   Partially imaged descending thoracic aortic stent endograft. Severe mixed calcific atherosclerosis. 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 Aneurysm is new compared to prior examination dated 2020. Standard branching pattern of the abdominal aorta with solitary bilateral renal arteries. Branch vessels are patent.   Review of the MIP images confirms the above findings.   NON-VASCULAR   Lower Chest: Small bilateral pleural effusions and associated atelectasis or consolidation. Large hiatal hernia, incompletely imaged.   Hepatobiliary: No focal liver abnormality is seen. Status post cholecystectomy. No biliary dilatation.   Pancreas: Unremarkable. No pancreatic ductal dilatation or surrounding inflammatory changes.   Spleen: Normal in size without significant abnormality.   Adrenals/Urinary Tract: Non nodular adenomatous thickening of the left adrenal gland, benign, requiring no further follow-up or characterization. Kidneys are normal, without renal calculi, solid lesion, or hydronephrosis. Bladder is unremarkable.   Stomach/Bowel: Stomach is within normal limits. Appendix appears normal. Circumferential wall thickening of the low rectum with adjacent perirectal and presacral fat stranding and fluid (series 6, image 127, 134).   Lymphatic: No enlarged abdominal or pelvic lymph nodes.   Reproductive: No mass or other significant abnormality.   Other: No abdominal wall hernia or abnormality. No ascites.   Musculoskeletal: No acute osseous findings.   IMPRESSION: 1. 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 Aneurysm is new compared to prior examination dated 2020. Recommend follow-up ultrasound every 3 years if not otherwise imaged and if  clinically appropriate. (Ref.: J Vasc Surg. 2018; 67:2-77 and J Am Coll Radiol 2013;10(10):789-794.) 2. Circumferential wall thickening of the low rectum with adjacent perirectal and presacral fat stranding and fluid, consistent with nonspecific infectious or inflammatory proctitis. 3. Small bilateral pleural effusions and associated atelectasis  or consolidation. 4. Large hiatal hernia, incompletely imaged.   Aortic Atherosclerosis (ICD10-I70.0).     Electronically Signed   By: Jearld Lesch M.D.   On: 12/20/2022 15:47      Narrative & Impression  CLINICAL DATA:  82 year old female altered mental status.   EXAM: CT HEAD WITHOUT CONTRAST   TECHNIQUE: Contiguous axial images were obtained from the base of the skull through the vertex without intravenous contrast.   RADIATION DOSE REDUCTION: This exam was performed according to the departmental dose-optimization program which includes automated exposure control, adjustment of the mA and/or kV according to patient size and/or use of iterative reconstruction technique.   COMPARISON:  Brain MRI 02/13/2021.  Head CT 09/14/2022.   FINDINGS: Brain: Cerebral volume is within normal limits for age. No midline shift, ventriculomegaly, mass effect, evidence of mass lesion, intracranial hemorrhage or evidence of cortically based acute infarction.   Gray-white differentiation appears stable to findings on the 2022 MRI, with patchy white matter hypodensity most pronounced in the posterior left corona radiata and tracking to the left lentiform.   Vascular: No suspicious intracranial vascular hyperdensity. Calcified atherosclerosis at the skull base.   Skull: Broad-based chronic mixed lucent and stippled bone lesion of the Left parietal calvarium (series 3, image 61), about 33 mm in length and stable since 2022, with intrinsic increased T1 signal consistent with benign etiology, hemangioma. No acute or suspicious osseous lesion.  Advanced right TMJ degeneration.   Sinuses/Orbits: Visualized paranasal sinuses and mastoids are stable and well aerated.   Other: No acute orbit or scalp soft tissue finding.   IMPRESSION: 1. No acute intracranial abnormality. 2. 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 3. Incidental left parietal bone benign hemangioma.     Electronically Signed   By: Odessa Fleming M.D.   On: 12/19/2022 03:55      Narrative & Impression  CLINICAL DATA:  Mental status alteration   EXAM: CHEST - 2 VIEW   COMPARISON:  12/06/2022 x-ray   FINDINGS: Interval placement of the thoracic endograft. The aorta continues to have a tortuous ectatic course. Stable cardiopericardial silhouette. Vascular congestion now seen. No pneumothorax or consolidation. Small effusions posteriorly identified on the lateral view. No frank edema. Curvature and degenerative changes of the spine.   IMPRESSION: Interval placement of aortic endograft.  Stable cardiac silhouette.   Vascular congestion.  Small effusions.     Electronically Signed   By: Karen Kays M.D.   On: 12/18/2022 17:28      Labs:  Basic Metabolic Panel: Recent Labs  Lab 12/28/22 0531 12/30/22 1407 12/31/22 1455 01/01/23 1303  NA 136 136 135 137  K 4.3 4.6 4.5 3.9  CL 106 106 107 107  CO2 24 21* 22 22  GLUCOSE 100* 103* 108* 125*  BUN 29* 42* 51* 33*  CREATININE 1.16* 1.26* 1.23* 0.97  CALCIUM 8.1* 8.2* 8.0* 7.9*  MG  --  2.5* 2.6*  --     CBC: Recent Labs  Lab 12/28/22 0531 12/31/22 1455 01/01/23 1303  WBC 6.7 10.5 6.7  NEUTROABS 5.0 8.7*  --   HGB 9.2* 8.7* 8.2*  HCT 29.4* 27.6* 26.1*  MCV 87.0 88.2 86.7  PLT 273 244 247    CBG: Recent Labs  Lab 12/31/22 1153  GLUCAP 104*    Brief HPI:   Heather Molina is a 82 y.o. female with a history of paroxysmal atrial fibrillation maintained on Eliquis who presented to [facility] on [facility]/06/2022 with acute chest and back pain.   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  She was started on esmolol and transferred to the [facility] campus.  Vascular surgery consulted and operative consent obtained for TEVAR. She was taken to the operating room by Dr. Hetty Blend and underwent TEVAR with coverage of LSA, right femoral endarterectomy and patch angioplasty.  She was extubated and transferred to the intensive care unit for observation.  Critical care management consulted for ICU management.  On postop day 1, she was unable to move her feet. Discussion by Dr. Hetty Blend with patient and family regarding spinal cord ischemia.  She was told of high likelihood of long-term paralysis.  It was explained to her that pressure elevation and lumbar drain placement was the only treatment to maximize recovery potential.  The patient deferred undergoing any further procedures.  Blood pressure control with MAP goal greater than 100 for 48 hours continued.  She required replacement of Foley catheter on 10/16 due to retention.  PT and OT evaluations obtained.  She remains on heparin subcutaneously for DVT prophylaxis.  She has been cleared to restart Eliquis by vascular surgery as of 10/18.  Heart rate controlled on Lopressor and Pacerone.  History of COPD and continues with nebulizer treatments.  She is oxygen dependent and maintaining saturations on 2 L via nasal cannula.  Appears to have had acute kidney injury and unknown history of chronic kidney disease.  Serum creatinine continues to improve.  She has had 450 cc of urine output last 24 hours 10/17-10/18. She is tolerating regular diet.  Hospital Course: Heather Molina was admitted to rehab 12/12/2022 for inpatient therapies to consist of PT, ST and OT at least three hours five days a week. Past admission physiatrist, therapy team and rehab RN have  worked together to provide customized collaborative inpatient rehab.  Salt tablets ordered due to downward trend in serum sodium.  Ordered sleep/wake chart and follow-up labs 10/21.  Urinalysis and urine culture obtained to rule out infection as cause of lethargy.  Serum creatinine and BUN improved.  Due to her genic bladder, discontinue trazodone and continued melatonin 10 mg nightly.  The patient continued to report poor sleep and Restoril 7.5 mg started 10/22.  Flomax added. Urinalysis negative. Started Megace for poor appetite. Refused bowel program. Increased Remeron 15 mg q HS due to reports of poor sleep 10/23. EKG ordered 10/25 as family reported elevated HR on pulse Ox. Reviewed EKG with Dr. Flora Lipps who felt that ST changes due to LVH and no new changes. Patient found to have profound lethargy afternoon of 10/25. ABGs, CXR and head CT performed. UA positive and started on Bactrim. Changed Remeron to 7.5 mg. TSH/T4 wnl. Medicine service consulted. Rec: psych consult but did not feel this was appropriate at this time. Neuropsychology consult on 10/28. Follow-up CTA abd/pelvis on 10/28 due to pulsatile aorta on exam. CT stable. Transfused 2 units PRBCs due to hgb on 6.7 on 10/28. Giving IVFs on 10/29 due to low UOP. Nephrology consulted on 10/30 for increased BUN and Cr despite IVFs and stopped Bactrim. Discontinued Remeron 10/31. Says bed is causing left shoulder, forearm and lateral chest discomfort.  Hot flash accompanied by bradycardia on 11/06. Junctional rhythm on EKG. Cardiology consulted and Lopressor and aspirin discontinued. Serial troponins 29 and 36. Maintained normal BP. Increased lethargy  noted 11/07. UA positive and started on Bactrim DS with IVF hydration 1L.  BUN improved to 33 and creatinine normal at 0.97 on 11/08. Cardiology followed-up 11/08 without new recs. Hemoccult times one positive and hgb trending downward since transfusion and GI consulted on 11/08. Her Eliquis was held and she  underwent EGD with biopsies on 11/11.  Findings included: Ulcer of esophagus with bleeding, Congenital malformation of esophagus, unspecified, Diaphragmatic hernia without obstruction or gangrene Gastric ulcer, unspecified as acute or chronic without hemorrhage or perforation, Angiodysplasia of stomach and duodenum with bleeding treated with argon plasma.  Rec: Protonix 40 mg BID for 8 weeks, Carafate 4 times daily for 2 weeks. Eliquis 2.5 mg twice daily restarted 11/13.  No evidence of Candida esophagitis.  Repeat CTA of chest abdomen pelvis performed on 11/18.   Blood pressures were monitored on TID basis and metoprolol tartrate 25 mg BID continued. Decreased Lopressor dose to 12.5 mg BID 10/23.  Added Norvasc 2.5 mg due to persistently elevated BP on 10/24. Increased Norvasc to 5 mg daily on 11/05. Discontinued Lopressor and on 11/06 due to symptomatic bradycardia/vasovagal episode.   Rehab course: During patient's stay in rehab weekly team conferences were held to monitor patient's progress, set goals and discuss barriers to discharge. At admission, patient required max assist with mobility, total assist with basic self-care skills.  She  has had improvement in activity tolerance, balance, postural control as well as ability to compensate for deficits. She has had improvement in functional use RUE/LUE as well as improvement in awareness       Disposition:  There are no questions and answers to display.         Diet: heart healthy  Special Instructions: No driving, alcohol consumption or tobacco use.  GI service recommends Protonix 40 mg BID for 8 weeks.   Allergies as of 01/01/2023       Reactions   Ceftriaxone Hives, Shortness Of Breath, Swelling, Rash   Redness    Albuterol Other (See Comments)   HEART PALPATIONS AND JITTERY     Med Rec must be completed prior to using this [facility]***       Follow-up Information     Lovorn, Aundra Millet, MD Follow up.   Specialty:  Physical Medicine and Rehabilitation Why: office will call you to arrange your appt (sent) Contact information: [address] [place] [address] [phone]         Hadley Pen, MD Follow up.   Specialty: Family Medicine Why: Call the office in 1 to 2 days to make arrangements for hospital follow-up appointment. Contact information: [address] [place] Round [place] [address] [phone]         Daria Pastures, MD. Go to.   Specialty: Vascular Surgery Contact information: [address] [place] [address] [phone]         RevankarAundra Dubin, MD Follow up.   Specialty: Cardiology Contact information: [address] [place] [address] [phone]                 Signed: Milinda Antis 01/01/2023, 3:20 PM

## 2022-12-14 NOTE — Consult Note (Signed)
WOC Nurse Consult Note: Reason for Consult: DTPI sacrum  Wound type: 1.  Deep Tissue Pressure Injury sacrum 2.  Stage 2 Pressure Injury B heels and L ischium  Pressure Injury POA: Yes Measurement: 1.  Deep Tissue Pressure Injury sacrum 3 cm x 1 cm purple maroon discoloration evolving with partial thickness skin loss  2.  L lateral heel stage 2 (intact serum filled blister) 2 cm x 2 cm; R lateral heel stage 2 (intact serum filled blister) 2 cm x 2 cm  3.  L ischium Stage 2 3 cm x 1 cm 100% pink moist  Wound bed:as above  Drainage (amount, consistency, odor) minimal from sacral wound/L ischium  Periwound: intact  Dressing procedure/placement/frequency:  Cleanse sacrum with soap and water, dry and apply Xeroform gauze [employer] [place] #[id number]) to purple maroon discoloration daily. Cover with silicone foam. May lift foam daily to replace Xeroform. Change foam q3 days and prn soiling.  Apply silicone foam to L ischium and bilateral heels. Lift foam daily to assess areas.  Place B feet in Prevalon boots (which are in room at time of this visit) for pressure reduction.  Patient would benefit from a low air loss mattress for pressure redistribution and moisture management.   WOC team will not follow. Re-consult if further needs arise.   Thank you,    Priscella Mann MSN, RN-BC, [employer] [phone]

## 2022-12-14 NOTE — Progress Notes (Signed)
Physical Therapy Session Note  Patient Details  Name: Heather Molina MRN: [id number] Date of Birth: 07-12-1940  Today's Date: 12/14/2022 PT Individual Time: 1005-1050 PT Individual Time Calculation (min): 45 min  and Today's Date: 12/14/2022 PT Missed Time: 15 Minutes Missed Time Reason: Patient fatigue  Short Term Goals: Week 1:  PT Short Term Goal 1 (Week 1): Pt will require mod A with rolling PT Short Term Goal 2 (Week 1): Pt will require mod A with supine <>sit PT Short Term Goal 3 (Week 1): Pt will require mod A for static sitting balance x 1 minute PT Short Term Goal 4 (Week 1): Pt will require mod A for slide board transfer from bed<>chair  Skilled Therapeutic Interventions/Progress Updates:      Therapy Documentation Precautions:  Precautions Precautions: Fall Precaution Comments: paraplegia Restrictions Weight Bearing Restrictions: No  Pt received semi-reclined sleeping and awakens with sternal rub and warm washcloth to face. Pt max A with supine to sit and mod A with sit to supine. Pt engaged in trunk and postural control exercises to address static and dynamic sitting balance deficits edge of bed. Pt largely requires min A and intermittent mod/max A for loss of balance for static sitting with bilateral UE support of hands on knees. Pt requires mod A for dynamic sitting balance with reverse sit up's and for weightbearing into forearm and ipsilateral elbow extension to address triceps weakness. Pt returned to bed after reports of arm pain and provided rest. Pt left semi-reclined in bed with all needs in reach and alarm on.     Therapy/Group: Individual Therapy  Truitt Leep Truitt Leep PT, DPT  12/14/2022, 7:46 AM

## 2022-12-14 NOTE — Progress Notes (Signed)
Inpatient Rehabilitation  Patient information reviewed and entered into eRehab system by Oyuki Hogan M. Eri Mcevers, M.A., CCC/SLP, PPS Coordinator.  Information including medical coding, functional ability and quality indicators will be reviewed and updated through discharge.    

## 2022-12-14 NOTE — Progress Notes (Addendum)
Apparently patient has slept most of the day due to sleep wake disruption with decrease in intake. Foley removed this am but bladder scanned fro less than 200. Question accuracy as now > 6 hours advised nurse to cath patient. May need IVF if intake remains poor. She has had issues with fatigue and decreased activity tolerance. Will order UA/UCS to rule out infection as cause of lethargy. Salt tabs added due to downward trend in Na.  Will order sleep wake chart and labs in am.

## 2022-12-14 NOTE — Progress Notes (Signed)
PROGRESS NOTE   Subjective/Complaints:  Pt reports not sleeping at night- so sleeping during day.  Back hurting- wants tylenol usually works.   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   Has foley- we discussed will remove in AM.  She reports good appetite- however told everyone else today she had poor appetite and wasn't eating or drinking.  Notes O2 is home O2.    ROS:  Pt denies SOB, abd pain, CP, N/V/ (+)C/D, and vision changes  Except for HPI  Objective:   No results found. Recent Labs    12/12/22 0352 12/14/22 0600  WBC 7.1 7.2  HGB 7.0* 7.4*  HCT 21.9* 23.7*  PLT 189 292   Recent Labs    12/12/22 1930 12/14/22 0600  NA 133* 131*  K 4.6 4.2  CL 101 94*  CO2 23 27  GLUCOSE 105* 100*  BUN 32* 25*  CREATININE 1.31* 1.09*  CALCIUM 8.1* 8.5*    Urinalysis    Component Value Date/Time   COLORURINE YELLOW 12/14/2022 1755   APPEARANCEUR CLEAR 12/14/2022 1755   LABSPEC 1.013 12/14/2022 1755   PHURINE 5.0 12/14/2022 1755   GLUCOSEU NEGATIVE 12/14/2022 1755   HGBUR NEGATIVE 12/14/2022 1755   BILIRUBINUR NEGATIVE 12/14/2022 1755   KETONESUR NEGATIVE 12/14/2022 1755   PROTEINUR NEGATIVE 12/14/2022 1755   NITRITE NEGATIVE 12/14/2022 1755   LEUKOCYTESUR NEGATIVE 12/14/2022 1755      Intake/Output Summary (Last 24 hours) at 12/14/2022 1908 Last data filed at 12/14/2022 1813 Gross per 24 hour  Intake 360 ml  Output 1200 ml  Net -840 ml     Pressure Injury 12/12/22 Sacrum Medial Deep Tissue Pressure Injury - Purple or maroon localized area of discolored intact skin or blood-filled blister due to damage of underlying soft tissue from pressure and/or shear. (Active)  12/12/22 1844  Location: Sacrum  Location Orientation: Medial  Staging: Deep Tissue Pressure Injury - Purple or maroon localized area of discolored  intact skin or blood-filled blister due to damage of underlying soft tissue from pressure and/or shear.  Wound Description (Comments):   Present on Admission: Yes    Physical Exam: Vital Signs Blood pressure (!) 184/57, pulse 61, temperature (!) 97.5 F (36.4 C), temperature source Oral, resp. rate 16, height 5' (1.524 m), weight 50.2 kg, SpO2 100%.   General: awake, alert, appropriate,  sitting up slightly in bed; slightly lethargic- NAD HENT: conjugate gaze; oropharynx dry- O2 by [place] 2L CV: regular rate and rhythm; no JVD Pulmonary: CTA B/L; no W/R/R-decreased at bases GI: soft, NT, slightly distended, (+)BS- hypoactive-  Psychiatric: appropriate-  Neurological: Ox3- but sleepy/somewhat lethargic  GU: Foley in place draining yellow urine this AM  PRIOR EXAMS: Skin: Groin incision site looks okay, bruising noted bilateral shins 2 IVs in place left upper extremity looks okay  Neuro:   Mental Status: AAOx3, memory intact,  Speech/Languate:  fluent, follows simple commands CRANIAL NERVES: II: PERRL. Visual fields full III, IV, VI: EOM intact, no gaze preference or deviation V: normal sensation bilaterally VII: no asymmetry VIII: normal hearing to speech IX,  X: normal palatal elevation XI: 5/5 head turn and 5/5 shoulder shrug bilaterally XII: Tongue midline     MOTOR: RUE: 5/5 Deltoid, 5/5 Biceps, 5/5 Triceps,5/5 Grip LUE: 5/5 Deltoid, 5/5 Biceps, 5/5 Triceps, 5/5 Grip RLE: 0 out of 5 throughout LLE: 0 out of 5 throughout   SENSORY: Normal to touch all 4 extremities to light touch in bilateral upper and lower extremities, patchy altered sensation to cold in lower extremities   No ankle clonus bilaterally   Coordination: Normal finger to nose, no dysmetria  Assessment/Plan: 1. Functional deficits which require 3+ hours per day of interdisciplinary therapy in a comprehensive inpatient rehab setting. Physiatrist is providing close team supervision and 24 hour  management of active medical problems listed below. Physiatrist and rehab team continue to assess barriers to discharge/monitor patient progress toward functional and medical goals  Care Tool:  Bathing    Body parts bathed by patient: Right arm, Left arm, Chest, Abdomen, Front perineal area, Face   Body parts bathed by helper: Buttocks, Right upper leg, Left upper leg, Right lower leg, Left lower leg     Bathing assist Assist Level: Maximal Assistance - Patient 24 - 49% (bed level)     Upper Body Dressing/Undressing Upper body dressing   What is the patient wearing?: Pull over shirt    Upper body assist Assist Level: Maximal Assistance - Patient 25 - 49%    Lower Body Dressing/Undressing Lower body dressing            Lower body assist Assist for lower body dressing: Dependent - Patient 0%     Toileting Toileting    Toileting assist Assist for toileting: 2 Helpers (bed level)     Transfers Chair/bed transfer  Transfers assist     Chair/bed transfer assist level: Maximal Assistance - Patient 25 - 49%     Locomotion Ambulation   Ambulation assist   Ambulation activity did not occur: Safety/medical concerns (paraplegia, fatigue)          Walk 10 feet activity   Assist  Walk 10 feet activity did not occur: Safety/medical concerns (paraplegia, fatigue)        Walk 50 feet activity   Assist Walk 50 feet with 2 turns activity did not occur: Safety/medical concerns (paraplegia, fatigue)         Walk 150 feet activity   Assist Walk 150 feet activity did not occur: Safety/medical concerns (paraplegia, fatigue)         Walk 10 feet on uneven surface  activity   Assist Walk 10 feet on uneven surfaces activity did not occur: Safety/medical concerns (paraplegia, fatigue)         Wheelchair     Assist Is the patient using a wheelchair?: Yes Type of Wheelchair: Manual Wheelchair activity did not occur: Safety/medical concerns  (paraplegia, fatigue)         Wheelchair 50 feet with 2 turns activity    Assist    Wheelchair 50 feet with 2 turns activity did not occur: Safety/medical concerns (paraplegia, fatigue)       Wheelchair 150 feet activity     Assist  Wheelchair 150 feet activity did not occur: Safety/medical concerns (paraplegia, fatigue)       Blood pressure (!) 184/57, pulse 61, temperature (!) 97.5 F (36.4 C), temperature source Oral, resp. rate 16, height 5' (1.524 m), weight 50.2 kg, SpO2 100%.  Medical Problem List and Plan: 1. 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  Antithrombotics: -DVT/anticoagulation:  Pharmaceutical: Eliquis 2.5mg  BID             -antiplatelet therapy: Aspirin 81 mg daily   3. Pain Management: Tylenol, robaxin, oxycodone as needed   4. Mood/Behavior/Sleep: LCSW to evaluate and provide emotional support             -continue paroxetine 20 mg daily (hx of depression)             -antipsychotic agents: n/a -12/13/22 hasn't been sleeping well, increase melatonin to 10mg  QHS, added Trazodone 25mg  PRN but advised to be cautious about using it too late.    10/21- will stop Trazodone- can impair ability to pee- will start Restoril 7.5 mg at bedtime for sleep.  5. Neuropsych/cognition: This patient is capable of making decisions on her own behalf.   6. Skin/Wound Care: Routine skin care checks   7. Fluids/Electrolytes/Nutrition: Routine Is and Os and follow-up chemistries   8: Hypertension: monitor TID and prn (hx of a.fib see meds below). Monitor with mobility. Continue metoprolol 25mg  BID  -12/13/22 BPs a little up, monitor for trend  10/21- BP's running high up to 180s- systolic- but had documented BP 87/64 this AM- will wait to titrate BP meds.   Vitals:   12/12/22 1838 12/12/22 1904 12/13/22 0254 12/13/22 1503  BP: (!) 166/52 (!) 156/51 (!) 149/56 (!) 152/56   12/13/22 1919 12/14/22 0408 12/14/22 0733 12/14/22 1623  BP: (!) 162/72 (!) 178/62 (!) 87/64 (!) 184/57                 9: Hyperlipidemia: continue pravastatin 40mg  daily   10: COPD, O2 dependent 2L via [place]:             -continue Brovana nebs BID             -continue Yupelri neb daily   10/21- Has been on 2L O2 at home per pt and chart 11: CAD: stable on asa and statin   12: Atrial fibrillation, paroxysmal: maintained on Eliquis reversed pre-operatively             -continue amiodarone 100 mg daily             -continue metoprolol tartrate 25 mg BID             -cleared by [facility] to re-start Eliquis>pharmacy consult placed   13: 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  17: ABLA: stable; follow CBC trend             -  consider oral iron supplement -12/13/22 Hgb slowly downtrending from postop 8.8 on 10/14>7.6>7.5>7.3>7.0 yesterday; pt asymptomatic at this time; would recheck tomorrow but if still at the 7.0-range then would consider transfusion to help with therapy tolerance; unclear what her prior baseline was (has hgb 12.2 back in July but then down to 9's).    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    I spent a total of 53   minutes on total care today- >50% coordination of care- due to  Discussion with nursing x4 about foley being removed; about needing to cath pt and timing of things; and about lethargy- also d/w PA about lethargy as well- also education of pt about bowel program and removal of foley.    LOS: 2 days A FACE TO FACE EVALUATION WAS PERFORMED  Heylee Tant 12/14/2022, 7:08 PM

## 2022-12-14 NOTE — Plan of Care (Addendum)
Wound Plan   Braden Score: 12  Sensory: 2  Moisture: 3  Activity: 2  Mobility: 2  Nutrition: 2  Friction: 1   Wounds present:   DTI to mid sacrum 12/12/2022 (POA)   Cleanse sacrum with soap and water, dry, apply Xeroform gauze to purple maroon discoloration daily.  Cover with foam dressing.  May left foam daily to replace Xeroform.  Change foam dressing every 3 days and PRN when soiled.   Apply silicone foam to Left ischium and bilateral heels.  Lift foam daily to assess areas.  Place bilateral feet in Prevalon boots to reduce pressure.    Incision to right groin 12/07/22 (POA) Cover with dry gauze to wick moisture.  Change BID.  Do not use tape.   Interventions:   RD WOC Air mattress Turn every two hours while in bed Roho cushion  Ensure BID   Attendees/ Contributors:   Vedia Pereyra, RN Truitt Leep, PT [name], OT

## 2022-12-15 LAB — BASIC METABOLIC PANEL
Anion gap: 13 (ref 5–15)
BUN: 25 mg/dL — ABNORMAL HIGH (ref 8–23)
CO2: 26 mmol/L (ref 22–32)
Calcium: 8.7 mg/dL — ABNORMAL LOW (ref 8.9–10.3)
Chloride: 97 mmol/L — ABNORMAL LOW (ref 98–111)
Creatinine, Ser: 1.08 mg/dL — ABNORMAL HIGH (ref 0.44–1.00)
GFR, Estimated: 51 mL/min — ABNORMAL LOW (ref >60)
Glucose, Bld: 143 mg/dL — ABNORMAL HIGH (ref 70–99)
Potassium: 4.3 mmol/L (ref 3.5–5.1)
Sodium: 136 mmol/L (ref 135–145)

## 2022-12-15 LAB — CBC WITH DIFFERENTIAL/PLATELET
Abs Immature Granulocytes: 0.07 10*3/uL (ref 0.00–0.07)
Basophils Absolute: 0.1 10*3/uL (ref 0.0–0.1)
Basophils Relative: 1 %
Eosinophils Absolute: 0.3 10*3/uL (ref 0.0–0.5)
Eosinophils Relative: 4 %
HCT: 23.3 % — ABNORMAL LOW (ref 36.0–46.0)
Hemoglobin: 7.2 g/dL — ABNORMAL LOW (ref 12.0–15.0)
Immature Granulocytes: 1 %
Lymphocytes Relative: 10 %
Lymphs Abs: 0.6 10*3/uL — ABNORMAL LOW (ref 0.7–4.0)
MCH: 26.3 pg (ref 26.0–34.0)
MCHC: 30.9 g/dL (ref 30.0–36.0)
MCV: 85 fL (ref 80.0–100.0)
Monocytes Absolute: 1 10*3/uL (ref 0.1–1.0)
Monocytes Relative: 16 %
Neutro Abs: 4.1 10*3/uL (ref 1.7–7.7)
Neutrophils Relative %: 68 %
Platelets: 277 10*3/uL (ref 150–400)
RBC: 2.74 MIL/uL — ABNORMAL LOW (ref 3.87–5.11)
RDW: 16.6 % — ABNORMAL HIGH (ref 11.5–15.5)
WBC: 6.1 10*3/uL (ref 4.0–10.5)
nRBC: 0 % (ref 0.0–0.2)

## 2022-12-15 MED ORDER — TAMSULOSIN HCL 0.4 MG PO CAPS
0.4000 mg | ORAL_CAPSULE | Freq: Every day | ORAL | Status: DC
  Administered 2022-12-15 – 2023-01-11 (×28): 0.4 mg via ORAL
  Filled 2022-12-15 (×31): qty 1

## 2022-12-15 MED ORDER — VITAMIN C 500 MG PO TABS
250.0000 mg | ORAL_TABLET | Freq: Every day | ORAL | Status: DC
  Administered 2022-12-15 – 2023-01-12 (×28): 250 mg via ORAL
  Filled 2022-12-15 (×29): qty 1

## 2022-12-15 MED ORDER — MEGESTROL ACETATE 400 MG/10ML PO SUSP
800.0000 mg | Freq: Every day | ORAL | Status: DC
  Administered 2022-12-15 – 2023-01-12 (×28): 800 mg via ORAL
  Filled 2022-12-15 (×28): qty 20

## 2022-12-15 MED ORDER — PAROXETINE HCL 20 MG PO TABS
20.0000 mg | ORAL_TABLET | Freq: Every day | ORAL | Status: DC
  Administered 2022-12-16 – 2023-01-11 (×27): 20 mg via ORAL
  Filled 2022-12-15 (×27): qty 1

## 2022-12-15 MED ORDER — ADULT MULTIVITAMIN W/MINERALS CH
1.0000 | ORAL_TABLET | Freq: Every day | ORAL | Status: DC
Start: 2022-12-15 — End: 2023-01-12
  Administered 2022-12-15 – 2023-01-12 (×28): 1 via ORAL
  Filled 2022-12-15 (×28): qty 1

## 2022-12-15 NOTE — IPOC Note (Signed)
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)     Functional Problem List: Nursing Bladder, Bowel, Safety, Sensory, Skin Integrity, Endurance, Medication Management, Motor, Pain  PT Balance, Endurance, Motor, Skin Integrity, Nutrition  OT Balance, Endurance, Motor, Safety  SLP    TR         Basic ADL's: OT Grooming, Bathing, Dressing, Toileting, Eating     Advanced  ADL's: OT       Transfers: PT Bed Mobility, Bed to Chair, Car  OT Toilet     Locomotion: PT Wheelchair Mobility     Additional Impairments: OT None  SLP        TR      Anticipated Outcomes Item Anticipated Outcome  Self Feeding    Swallowing      Basic self-care     Toileting      Bathroom Transfers    Bowel/Bladder  manage bowel/bladder independenlty  Transfers  Min A  Locomotion  supervision w/c level  Communication     Cognition     Pain  less than 4  Safety/Judgment  no falls   Therapy Plan: PT Intensity: Minimum of 1-2 x/day ,45 to 90 minutes PT Frequency: 5 out of 7 days PT Duration Estimated Length of Stay: 4 weeks OT Intensity: Minimum of 1-2 x/day, 45 to 90 minutes OT Frequency: 5 out of 7 days OT Duration/Estimated Length of Stay: 3-4 weeks     Team Interventions: Nursing Interventions Patient/Family Education, Medication Management, Psychosocial Support, Bladder Management, Bowel Management, Skin Care/Wound Management, Disease Management/Prevention, Pain Management, Discharge Planning  PT interventions Discharge planning, DME/adaptive equipment instruction, Functional mobility training, Psychosocial support, Splinting/orthotics, Therapeutic Activities, UE/LE Strength taining/ROM, Wheelchair propulsion/positioning, UE/LE Coordination  activities, Therapeutic Exercise, Skin care/wound management, Patient/family education, Neuromuscular re-education, Functional electrical stimulation, Disease management/prevention, Firefighter, Warden/ranger  OT Interventions Warden/ranger, Discharge planning, Functional electrical stimulation, Self Care/advanced ADL retraining, Therapeutic Activities, UE/LE Coordination activities, Pain management, Disease mangement/prevention, Functional mobility training, Patient/family education, Therapeutic Exercise, Skin care/wound managment, Community reintegration, Fish farm manager, Neuromuscular re-education, Psychosocial support, Splinting/orthotics, UE/LE Strength taining/ROM, Wheelchair propulsion/positioning  SLP Interventions    TR Interventions    SW/CM Interventions Discharge Planning, Psychosocial Support, Patient/Family Education   Barriers to Discharge MD  Medical stability  Nursing Decreased caregiver support, Incontinence, Wound Care, Weight bearing restrictions home with daughter to 1 level with ramp entrance  PT Neurogenic Bowel & Bladder, Incontinence, Other (comments) (supplemental O2)    OT Neurogenic Bowel & Bladder    SLP      SW Neurogenic Bowel & Bladder     Team Discharge Planning: Destination: PT-Home ,OT- Home , SLP-  Projected Follow-up: PT-Home health PT, Outpatient PT, OT-  Outpatient OT, SLP-  Projected Equipment Needs: PT-To be determined, OT-  , SLP-  Equipment Details: PT-owns RW and SPC, OT-owns 3-1 commode (not DABSC), RW, W/C, shower seat with back Patient/family involved in discharge planning: PT- Patient,  OT-Patient, SLP-   MD ELOS: 3-4 weeks Medical Rehab Prognosis:  Excellent Assessment: The patient has been admitted for CIR therapies with the diagnosis of spinal cord infarct after TEVAR with paraparesis. The team will be addressing functional mobility, strength, stamina, balance, safety, adaptive  techniques and equipment, self-care, bowel and bladder mgt, patient and caregiver education, pain mgt, w/c set  up, community reentry. Goals have been set at supervision to mod I at a w/c level. Anticipated discharge destination is home.        See Team Conference Notes for weekly updates to the plan of care

## 2022-12-15 NOTE — Progress Notes (Addendum)
Orthopedic Tech Progress Note Patient Details:  Heather Molina 06-03-1940 [id number]  Ortho Devices Type of Ortho Device: Prafo boot/shoe Ortho Device/Splint Location: Bilateral Ortho Device/Splint Interventions: Ordered   Post Interventions Patient Tolerated: Well Instructions Provided: Care of device, Adjustment of device  Sherilyn Banker 12/15/2022, 3:25 PM

## 2022-12-15 NOTE — Progress Notes (Signed)
Physical Therapy Session Note  Patient Details  Name: Heather Molina MRN: [id number] Date of Birth: 05/16/1940  Today's Date: 12/15/2022 PT Individual Time: 0900-0930, [id number]  PT Individual Time Calculation (min): 30 min , 83 min   Short Term Goals: Week 1:  PT Short Term Goal 1 (Week 1): Pt will require mod A with rolling PT Short Term Goal 2 (Week 1): Pt will require mod A with supine <>sit PT Short Term Goal 3 (Week 1): Pt will require mod A for static sitting balance x 1 minute PT Short Term Goal 4 (Week 1): Pt will require mod A for slide board transfer from bed<>chair  Skilled Therapeutic Interventions/Progress Updates:      Therapy Documentation Precautions:  Precautions Precautions: Fall Precaution Comments: paraplegia Restrictions Weight Bearing Restrictions: No  Treatment Session 1:   Pt received semi-reclined in bed sleeping and arouses to voice. Pt lethargic throughout session, deferred transfer to w/c for safety due to lethargy. Pt mod A with HOB elevated to long sitting and requires mod A for sitting balance and upright posture in position. Pt dependent for donning of ted hose, leg loops and grip socks. In long sitting with PT support for trunk control PT mobilized each leg into figure four position and pt requires total A for threading legs through pants. Pt mod A with rolling in bilateral directions and initiated reaching with UE to pull pants up in S/L position. PT returned inadequate fitting TIS and provided repositioning for back pain relief. Pt left semi-reclined in bed with all needs in reach.   Treatment Session 2:   Pt received seated in w/c at bedside and agreeable to PT session largely limited 2/2 fatigue and left triceps soreness/pain. PT provided STM to triceps and pt reported mild relief. Pt dependently transported for time management/energy conservation from hospital room <>main gym. Pt max A with uphill slide board transfer and min A with downhill slide  board transfer. Pt mod A for static sitting balance with intermittent episodes of CGA with cues for upright gaze posture and midline postural alignment. Pt observed to be incontinent of bowel, dependently transported to room and max A with slide board transfer to bed with use of step stool. Pt min A for rolling in bilateral directions and dependent for peri-care. PT provided pt's daughter with stretching/flexibility program.   Pt reports history of urgency with bowel and incontinence for past 20 years. Pt utilizes over the counter anti-diarrheal medication when going out in to the community for relief. 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 Pt educated on decreased GI motility following SCI and bowel re-training can take up to 3 weeks. PT agreeable to perform bowel program with female nurses and MD notified. Pt left semi-reclined in bed with all needs in reach and daughter present.   Therapy/Group: Individual Therapy  Truitt Leep Truitt Leep PT, DPT  12/15/2022, 7:42 AM

## 2022-12-15 NOTE — Progress Notes (Addendum)
Initial Nutrition Assessment  DOCUMENTATION CODES:   Not applicable  INTERVENTION:  -liberalize diet to regular in an effort to improve PO intake.  -provide Magic Cup daily, continue with Ensure Enlive to optimize nutrient intakes.  -recommend MVI/Minerals one tablet daily and vitamin C 250 mg daily for micronutrient support to aide in pressure injury healing.  -continue appetite stimulant.   NUTRITION DIAGNOSIS:   Increased nutrient needs related to wound healing as evidenced by estimated needs.    GOAL:   Patient will meet greater than or equal to 90% of their needs    MONITOR:   PO intake, Supplement acceptance, Labs, Weight trends, Skin  REASON FOR ASSESSMENT:   Consult Assessment of nutrition requirement/status, Diet education, Wound healing, Poor PO  ASSESSMENT: 82 y/o female presented to [facility] on 12/06/22 with complaint of acute chest and back pain. 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 Transferred to [facility]. 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 She was taken to surgery and underwent TEVAR, transferred to ICU for observation. Postop day 1 she was unable to move her feet. Acute blood loss anemia following surgery.  PMH: acute on chronic respiratory failure with hypoxia, osteoporosis, anxiety, COPD, CAD, CAP, CVA, nicotine dependence, GERD, generalized edema, HTN, IBS with diarrhea, dyslipidemia, A-fib, current major depressive disorder, cholecystectomy, endarterectomy-femoral, thoracic aortic endovascular stent graft.   10/14-s/p TEVAR  with spinal cord ischemia BLE hemiparesis   Per review of EMR weight hx, no significant weight change identified. Intakes recorded 0-75% average 31% x 6 meals. Patient informs she was eating well with a good appetite at home prior to admit.  She lives with her daughter who prepares her meals.  Typically consumes two  meals daily with a snack in between.  Tolerates all foods, denies chewing or swallowing difficulty with foods and or liquids. No food restrictions on usual basis. Provided nutrition recommendations aide in pressure injury healing. Written education materials to be given to reinforce information. She is receiving the Ensure and taking although not a preferences, verbalizes importance of receiving adequate nutrition.   Medications reviewed and include dulcolax suppository, megestrol, PPI, sodium chloride tablet 1 g three times daily.   Labs: hgb A1C 5.2%   NUTRITION - FOCUSED PHYSICAL EXAM:  Flowsheet Row Most Recent Value  Orbital Region No depletion  Upper Arm Region No depletion  Thoracic and Lumbar Region No depletion  Buccal Region No depletion  Temple Region Mild depletion  Clavicle Bone Region Mild depletion  Clavicle and Acromion Bone Region Mild depletion  Scapular Bone Region Unable to assess  [unable to reposition]  Dorsal Hand No depletion  Patellar Region No depletion  Anterior Thigh Region Unable to assess  [wearing a brace]  Posterior Calf Region Moderate depletion  [paraplegia]  Edema (RD Assessment) Mild  Hair Reviewed  Eyes Reviewed  [pallor inner eyelids]  Mouth Reviewed  Skin Reviewed  Nails Reviewed  [pallor nail beds]       Diet Order:   Diet Order             Diet regular Room service appropriate? Yes; Fluid consistency: Thin  Diet effective now                   EDUCATION NEEDS:   Education needs have been addressed  Skin:  Skin Assessment: Skin Integrity Issues: Skin Integrity Issues:: DTI DTI: medial sacrum  Last BM:  10/21, type 5-medium  Height:  Ht Readings from Last 1 Encounters:  12/12/22 5' (1.524 m)    Weight:   Wt Readings from Last 1 Encounters:  12/14/22 50.2 kg    Ideal Body Weight:  45.3 kg (adjusted IBW by 5% due to paraplegia)  BMI:  Body mass index is 21.61 kg/m.  Estimated Nutritional Needs:   Kcal:   1350-1600  Protein:  59-68  Fluid:  1600    Alvino Chapel, RDLD Clinical Dietitian See AMION for contact information

## 2022-12-15 NOTE — Progress Notes (Signed)
Occupational Therapy Session Note  Patient Details  Name: Heather Molina MRN: [id number] Date of Birth: 1940-08-29  Today's Date: 12/15/2022 OT Individual Time: 4098-1191 OT Individual Time Calculation (min): 60 min    Short Term Goals: Week 1:  OT Short Term Goal 1 (Week 1): Pt will complete sitting EOB with Mod A to complete ADL task OT Short Term Goal 2 (Week 1): Pt will complete LB dressing with Max A OT Short Term Goal 3 (Week 1): Pt will complete UB dressing with Mod A  Skilled Therapeutic Interventions/Progress Updates:   Pt seen for skilled OT session this am. Pt continues to report fatigue but open to all presented activity. OT worked with pt on LE management from supine to sit with bed features and leg loop management. Sitting balance EOB with mod-max A at times. Oral care, hair care and simple snack and hydration self feeding completed with multiple rests and support. Returned supine with HOB elevated for simple UE therex including scap retraction, sh elevation/depression, foam cube squeezes with light resistive cube 10 reps x 2 sets each. Left pt bed level to continue resting with O2 via [place] on 2 lts in place, needs, bed exit on and nurse call button in reach.   Pain: denies pain   Therapy Documentation Precautions:  Precautions Precautions: Fall Precaution Comments: paraplegia Restrictions Weight Bearing Restrictions: No   Therapy/Group: Individual Therapy  Vicenta Dunning 12/15/2022, 7:38 AM

## 2022-12-15 NOTE — Progress Notes (Addendum)
Patient ID: GRACIANA NOTHDURFT, female   DOB: 1941/01/10, 82 y.o.   MRN: [id number]  Spoke with daughter to give team conference update she voiced her heart doctor took her off the metoprolol and now she is back on it. She is concerned regarding her drowsiness and questions what medications she is on. She has told bedside RN yesterday. Have messaged MD & PA to let them know of this. Daughter is coming in from the parking lot we'll see when here.  12:56 PM Daughter is here and not happy regarding her Mom's drowsiness and the medications she has told numerous staff she was taken off of or it was decreased. Have messaged MD to call her. Daughter plans to talk with Mom and if feels does not want to be here and would rather go home will work this out and do the best they can. Daughter wants her to be able to have some salt also since she will not eat this food without it. Will see how pt feels and daughter will get back with this worker regarding plans.

## 2022-12-15 NOTE — Progress Notes (Signed)
Refused bowel program throughout the night. Claimed she had a bowel movement yesterday twice (12/14/22).

## 2022-12-15 NOTE — Progress Notes (Signed)
Occupational Therapy Session Note  Patient Details  Name: ESABELLA BLANDING MRN: [id number] Date of Birth: 03/08/1940  Today's Date: 12/15/2022 OT Individual Time: 6213-0865 OT Individual Time Calculation (min): 44 min    Short Term Goals: Week 1:  OT Short Term Goal 1 (Week 1): Pt will complete sitting EOB with Mod A to complete ADL task OT Short Term Goal 2 (Week 1): Pt will complete LB dressing with Max A OT Short Term Goal 3 (Week 1): Pt will complete UB dressing with Mod A  Skilled Therapeutic Interventions/Progress Updates:      Therapy Documentation Precautions:  Precautions Precautions: Fall Precaution Comments: paraplegia Restrictions Weight Bearing Restrictions: No General: "Hi honey." Pt supine in bed upon OT arrival, agreeable to OT session. Daughter present.   Pain:  2/10 pain reported between scapulae, activity, intermittent rest breaks, distractions provided for pain management, pt reports tolerable to proceed.   ADL: Bed mobility: Max A, OT providing hand over hand assistance with LE management off of bed with leg loops and assistance with trunk Transfers: bed>W/C Max A with lateral transfer board to Lt, VC for hand placement, OT placed board Sitting balance: UE support seated EOB with Min A and occasional SBA with VC to find center of gravity  Exercises: Pt issued UE yellow theraband in order to increase functional strength, andurance and activity tolerance in order to increase independence in ADLs such as bathing. Pt issued yellow theraband and discussed direction/technique of exercises, demonstrating verbal understanding. Pt completed 3x10 exercises listed below: -elbow extensions - shoulder horizontal abduction -bicep curls   Other Treatments: OT, pt and pt daughter discussed at length eating and drinking d/t need for increased nutrients throughout the day. Daughter offering to bring food/drink from home that pt enjoys in order to increase motivation for  food/water intake. OT educated that increased nutrient intake can potentially decrease fatigue.   Pt seated in W/C at end of session with W/C alarm donned, call light within reach and 4Ps assessed.    Therapy/Group: Individual Therapy  [name], OTD, OTR/L 12/15/2022, 3:58 PM

## 2022-12-15 NOTE — Progress Notes (Signed)
PROGRESS NOTE   Subjective/Complaints:  Pt reports slept better ~ 5 hours last night- but still somewhat sleepy this AM- admits "couldn't keep her eyes open yesterday" for therapy.  Refused bowel program since supposedly had 2 Bms yesterday.  Eats fruit and drinks per -pt, but not eating secondary to poor appetite.   Said did bowel program, but was refused per notes.    ROS:   Pt denies SOB, abd pain, CP, N/V/C/D, and vision changes   Except for HPI  Objective:   No results found. Recent Labs    12/14/22 0600 12/15/22 0735  WBC 7.2 6.1  HGB 7.4* 7.2*  HCT 23.7* 23.3*  PLT 292 277   Recent Labs    12/14/22 0600 12/15/22 0735  NA 131* 136  K 4.2 4.3  CL 94* 97*  CO2 27 26  GLUCOSE 100* 143*  BUN 25* 25*  CREATININE 1.09* 1.08*  CALCIUM 8.5* 8.7*    Urinalysis    Component Value Date/Time   COLORURINE YELLOW 12/14/2022 1755   APPEARANCEUR CLEAR 12/14/2022 1755   LABSPEC 1.013 12/14/2022 1755   PHURINE 5.0 12/14/2022 1755   GLUCOSEU NEGATIVE 12/14/2022 1755   HGBUR NEGATIVE 12/14/2022 1755   BILIRUBINUR NEGATIVE 12/14/2022 1755   KETONESUR NEGATIVE 12/14/2022 1755   PROTEINUR NEGATIVE 12/14/2022 1755   NITRITE NEGATIVE 12/14/2022 1755   LEUKOCYTESUR NEGATIVE 12/14/2022 1755      Intake/Output Summary (Last 24 hours) at 12/15/2022 1041 Last data filed at 12/15/2022 7829 Gross per 24 hour  Intake 480 ml  Output 1000 ml  Net -520 ml     Pressure Injury 12/12/22 Sacrum Medial Deep Tissue Pressure Injury - Purple or maroon localized area of discolored intact skin or blood-filled blister due to damage of underlying soft tissue from pressure and/or shear. (Active)  12/12/22 1844  Location: Sacrum  Location Orientation: Medial  Staging: Deep Tissue Pressure Injury - Purple or maroon localized area of discolored intact skin or blood-filled blister due to damage of underlying soft tissue from pressure  and/or shear.  Wound Description (Comments):   Present on Admission: Yes    Physical Exam: Vital Signs Blood pressure (!) 150/62, pulse 65, temperature (!) 97.5 F (36.4 C), resp. rate 18, height 5' (1.524 m), weight 50.2 kg, SpO2 100%.    General: awake, alert, appropriate, NAD HENT: conjugate gaze; oropharynx moist CV: regular rate; no JVD Pulmonary: CTA B/L; no W/R/R- good air movement GI: soft, NT, ND, (+)BS Psychiatric: appropriate Neurological: Ox3   PRIOR EXAMS: Skin: Groin incision site looks okay, bruising noted bilateral shins 2 IVs in place left upper extremity looks okay  Neuro:   Mental Status: AAOx3, memory intact,  Speech/Languate:  fluent, follows simple commands CRANIAL NERVES: II: PERRL. Visual fields full III, IV, VI: EOM intact, no gaze preference or deviation V: normal sensation bilaterally VII: no asymmetry VIII: normal hearing to speech IX, X: normal palatal elevation XI: 5/5 head turn and 5/5 shoulder shrug bilaterally XII: Tongue midline     MOTOR: RUE: 5/5 Deltoid, 5/5 Biceps, 5/5 Triceps,5/5 Grip LUE: 5/5 Deltoid, 5/5 Biceps, 5/5 Triceps, 5/5 Grip RLE: 0 out of 5 throughout LLE: 0 out of 5  throughout   SENSORY: Normal to touch all 4 extremities to light touch in bilateral upper and lower extremities, patchy altered sensation to cold in lower extremities   No ankle clonus bilaterally   Coordination: Normal finger to nose, no dysmetria  Assessment/Plan: 1. Functional deficits which require 3+ hours per day of interdisciplinary therapy in a comprehensive inpatient rehab setting. Physiatrist is providing close team supervision and 24 hour management of active medical problems listed below. Physiatrist and rehab team continue to assess barriers to discharge/monitor patient progress toward functional and medical goals  Care Tool:  Bathing    Body parts bathed by patient: Right arm, Left arm, Chest, Abdomen, Front perineal area, Face    Body parts bathed by helper: Buttocks, Right upper leg, Left upper leg, Right lower leg, Left lower leg     Bathing assist Assist Level: Maximal Assistance - Patient 24 - 49% (bed level)     Upper Body Dressing/Undressing Upper body dressing   What is the patient wearing?: Pull over shirt    Upper body assist Assist Level: Maximal Assistance - Patient 25 - 49%    Lower Body Dressing/Undressing Lower body dressing            Lower body assist Assist for lower body dressing: Dependent - Patient 0%     Toileting Toileting    Toileting assist Assist for toileting: 2 Helpers (bed level)     Transfers Chair/bed transfer  Transfers assist  Chair/bed transfer activity did not occur: Safety/medical concerns  Chair/bed transfer assist level: Maximal Assistance - Patient 25 - 49%     Locomotion Ambulation   Ambulation assist   Ambulation activity did not occur: Safety/medical concerns (paraplegia, fatigue)          Walk 10 feet activity   Assist  Walk 10 feet activity did not occur: Safety/medical concerns (paraplegia, fatigue)        Walk 50 feet activity   Assist Walk 50 feet with 2 turns activity did not occur: Safety/medical concerns (paraplegia, fatigue)         Walk 150 feet activity   Assist Walk 150 feet activity did not occur: Safety/medical concerns (paraplegia, fatigue)         Walk 10 feet on uneven surface  activity   Assist Walk 10 feet on uneven surfaces activity did not occur: Safety/medical concerns (paraplegia, fatigue)         Wheelchair     Assist Is the patient using a wheelchair?: Yes Type of Wheelchair: Manual Wheelchair activity did not occur: Safety/medical concerns (paraplegia, fatigue)         Wheelchair 50 feet with 2 turns activity    Assist    Wheelchair 50 feet with 2 turns activity did not occur: Safety/medical concerns (paraplegia, fatigue)       Wheelchair 150 feet activity      Assist  Wheelchair 150 feet activity did not occur: Safety/medical concerns (paraplegia, fatigue)       Blood pressure (!) 150/62, pulse 65, temperature (!) 97.5 F (36.4 C), resp. rate 18, height 5' (1.524 m), weight 50.2 kg, SpO2 100%.  Medical Problem List and Plan: 1. 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  Antithrombotics: -DVT/anticoagulation:  Pharmaceutical: Eliquis 2.5mg  BID             -antiplatelet therapy: Aspirin 81 mg daily   3. Pain Management: Tylenol, robaxin, oxycodone as needed   4. Mood/Behavior/Sleep: LCSW to evaluate and provide emotional support             -continue paroxetine 20 mg daily (hx of depression)             -antipsychotic agents: n/a -12/13/22 hasn't been sleeping well, increase melatonin to 10mg  QHS, added Trazodone 25mg  PRN but advised to be cautious about using it too late.    10/21- will stop Trazodone- can impair ability to pee- will start Restoril 7.5 mg at bedtime for sleep.   10/22- cannot start Remeron due to risk of torsades de pointes- will con't Restoril 5. Neuropsych/cognition: This patient is capable of making decisions on her own behalf.   6. Skin/Wound Care: Routine skin care checks   7. Fluids/Electrolytes/Nutrition: Routine Is and Os and follow-up chemistries   8: Hypertension: monitor TID and prn (hx of a.fib see meds below). Monitor with mobility. Continue metoprolol 25mg  BID  -12/13/22 BPs a little up, monitor for trend  10/21- BP's running high up to 180s- systolic- but had documented BP 87/64 this AM- will wait to titrate BP meds. 10/22- BP still in 150s- but no LOW BP's this AM- will see how does in therapy-   Vitals:   12/12/22 1838 12/12/22  1904 12/13/22 0254 12/13/22 1503  BP: (!) 166/52 (!) 156/51 (!) 149/56 (!) 152/56   12/13/22 1919 12/14/22 0408 12/14/22 0733 12/14/22 1623  BP: (!) 162/72 (!) 178/62 (!) 87/64 (!) 184/57   12/14/22 2023 12/15/22 0454  BP: (!) 181/53 (!) 150/62                 9: Hyperlipidemia: continue pravastatin 40mg  daily   10: COPD, O2 dependent 2L via [place]:             -continue Brovana nebs BID             -continue Yupelri neb daily   10/21- Has been on 2L O2 at home per pt and chart 11: CAD: stable on asa and statin   12: Atrial fibrillation, paroxysmal: maintained on Eliquis reversed pre-operatively             -continue amiodarone 100 mg daily             -continue metoprolol tartrate 25 mg BID             -cleared by [facility] to re-start Eliquis>pharmacy consult placed   13: 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 10/22- not voiding -U/A (-)  will add Flomax 0.4 mg q supper to see if she can void- I don't think it's likely. D/w nursing due to low amount of caths-  17: ABLA: stable; follow CBC trend             -consider oral iron supplement -12/13/22 Hgb slowly downtrending from postop 8.8 on 10/14>7.6>7.5>7.3>7.0 yesterday; pt asymptomatic at this time; would recheck tomorrow but if still at the 7.0-range then would consider transfusion to help with therapy tolerance; unclear what her prior baseline was  (has hgb 12.2 back in July but then down to 9's).    10/21- Hb 7.4 today 18: AKI: improving; CMP 12/12/22 showing Cr 1.31; monitor Monday BMPs   10/21- Cr 1.09 down from 1.31 and BUN 25 down from 25- however is drinking poorly and has poor output-   19: 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  10/22- continue to refuse bowel program-in spite of education of bowel program and need for it.   20. Poor Appetite 10/22- will add Megace for poor appetite since cannot add Remeron and too sedated to add Periactin. Will also order push fluids since not drinking great- although her BUN?Cr looking better- BUN 25 and Cr 1.08-     I spent a total of  51  minutes on total care today- >50% coordination of care- due to  D/w PA as well as team conference about lack of appetite- cannot do Remeron due to concern for torsades de Pointes- so will con't Restoril and add Megace for appetite.  Also d/w nursing about cathing and poor intake-    LOS: 3 days A FACE TO FACE EVALUATION WAS PERFORMED  Korena Nass 12/15/2022, 10:41 AM

## 2022-12-16 MED ORDER — TEMAZEPAM 7.5 MG PO CAPS
15.0000 mg | ORAL_CAPSULE | Freq: Every day | ORAL | Status: DC
  Administered 2022-12-16: 15 mg via ORAL
  Filled 2022-12-16: qty 2

## 2022-12-16 MED ORDER — METOPROLOL TARTRATE 12.5 MG HALF TABLET
12.5000 mg | ORAL_TABLET | Freq: Two times a day (BID) | ORAL | Status: DC
  Administered 2022-12-16 – 2022-12-30 (×28): 12.5 mg via ORAL
  Filled 2022-12-16 (×28): qty 1

## 2022-12-16 NOTE — Patient Care Conference (Signed)
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 Genice Rouge Social Worker Present: Dossie Der, LCSW Nurse Present: Vedia Pereyra, RN PT Present: Truitt Leep, PT OT Present: [name], OT PPS Coordinator present : Fae Pippin, SLP     Current Status/Progress Goal Weekly Team Focus  Bowel/Bladder   Continent of bladder, incontinent of bowel   pt would be able to verbalize or call nurse/tech when passig out stool   bowel program    Swallow/Nutrition/ Hydration               ADL's   Max A slide board transfers, Max-total A all ADLs bed level   Min A   dynamic sitting balance with ADLs, UE strength/endurance, ADL retraining    Mobility   mod/max bed mobility, max slide board transfer   min A, w/c level supervision  w/c adjustment and sitting balance    Communication                Safety/Cognition/ Behavioral Observations               Pain   severe back pain   lessen pain from severe to mild or moderate   pain management through medicine and other non-pharmacological interventions    Skin   brusing noted in bilateral upper and lower extremities    Skin to remain intact and free of infection/breakdown.  Turn every 2 hours at HS and monitor skin daily.      Discharge Planning:  HOme with daughter and son  in-law who are working on ramp for her. Daughter will be main caregiver and comes in every other day. 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 Wound care in place. Bowel program (refusing) I/O cath due to inability to void.  Back pain managed with PRN medications. Not sleeping well. DTI to sacrum.  Right groin with dressing dressing changes. Left groin healing.  Orthostatic. Sleeping better. Educating on use of leg loops. Tolerating heart healthy diet with poor PO intake. Uses O2 @ 2L via [place] at home.  Patient on target to meet rehab goals: yes, will continue to progress with goals and change as needed, discharge date estimated 3/4 weeks  *See Care Plan and progress notes for long and short-term goals.   Revisions to Treatment Plan:  Restoril scheduled. Flomax. Thigh high teds.  Megace. Monitor I/O, labs, VS.  Order PRAFO's Teaching Needs: Medications, bowel/bladder training, safety, self care, transfer training, skin care, etc.   Current Barriers to Discharge: Decreased caregiver support, Neurogenic bowel and bladder, and Wound care  Possible Resolutions to Barriers: Family education Independent with bowel/bladder program Independent with wound care and pressure relief. Order recommended DME     Medical Summary Current Status: in/out cathing- - not really  peeing- not sleeping well- poor appetite- DTI? buttocks- L hip Has prevalons  Barriers to Discharge: Behavior/Mood;Weight bearing restrictions;Neurogenic Bowel & Bladder;Incontinence;Complicated Wound;Medical stability;Oxygen Requirement;Self-care education  Barriers to Discharge Comments: Limited by O2 dep; B/L foot drop/prplegia; insomnia; poor appetite- poor endruance/fatigue/malaise; poor recall Possible Resolutions to [employer] Focus: don't know why so sedated- but having insomnia- added Magace for appetite; Resterol for sleep; - add PRAFOs- d/c 4  weeks   Continued Need for Acute Rehabilitation Level of Care: The patient requires daily medical management by a physician with specialized training in physical medicine and rehabilitation for the following reasons: Direction of a multidisciplinary physical rehabilitation program to maximize functional independence : Yes Medical management of patient stability for increased activity during participation in an intensive rehabilitation regime.: Yes Analysis of laboratory values and/or radiology reports with any subsequent need for medication adjustment and/or medical intervention. : Yes   I attest that I was present, lead the team conference, and concur with the assessment and plan of the team.   Jearld Adjutant 12/16/2022, 9:02 AM

## 2022-12-16 NOTE — Progress Notes (Signed)
Occupational Therapy Session Note  Patient Details  Name: Heather Molina MRN: [id number] Date of Birth: 1940-12-17  Today's Date: 12/16/2022 OT Individual Time: 1301-1415 OT Individual Time Calculation (min): 74 min    Short Term Goals: Week 1:  OT Short Term Goal 1 (Week 1): Pt will complete sitting EOB with Mod A to complete ADL task OT Short Term Goal 2 (Week 1): Pt will complete LB dressing with Max A OT Short Term Goal 3 (Week 1): Pt will complete UB dressing with Mod A  Skilled Therapeutic Interventions/Progress Updates:   Pt seen for skilled OT session. Pt supine on 2 ltrs O2 via [place] upon OT arrival. Switched from wal to tank O2. Rolling to assess brief dryness with cues not to overreach with L UE. Supine to sit with max-mod A, EOB sitting with step supporting LE's for added BOS. Pt required max A to maintain balance while OT applied gait belt. High risk for sliding from air mattress. Lateral leans and TB placement with total A, increased time and effort with lateral transfer. Needs max support to reposition hips in w/c and maintain adequate anterior pelvis. OT assessed SpO2 on tank via [place] with 92% recovering to 100% with 2 min rest. Oral and hair care sink side with weight shifts forward and set up. OT transported to outside patio on 1st floor via w/c off and back on unit for time management. Training with pressure reliefs laterally and education for skin protection. Pt able to use 2 lb weighted ball for B biceps curls 2 sets of 10 reps. Applied L RTC region ktaping for pain management and educated on rationale. Once back on unit handoff to NT for further vital assessment and set up of needs.    Pain: L shoulder 6/10 with reaching, applied ktape to RTC with )/10 pain at rest but reported increased overall sense of support  Therapy Documentation Precautions:  Precautions Precautions: Fall Precaution Comments: paraplegia Restrictions Weight Bearing Restrictions: No   Therapy/Group:  Individual Therapy  Vicenta Dunning 12/16/2022, 7:51 AM

## 2022-12-16 NOTE — Progress Notes (Signed)
Occupational Therapy Session Note  Patient Details  Name: Heather Molina MRN: [id number] Date of Birth: 10/20/1940  Today's Date: 12/16/2022 OT Individual Time: 1020-1100 & 4098-1191 OT Individual Time Calculation (min): 40 min & 22 Missed minute: 8 min d/t nursing care (cathing)   Short Term Goals: Week 1:  OT Short Term Goal 1 (Week 1): Pt will complete sitting EOB with Mod A to complete ADL task OT Short Term Goal 2 (Week 1): Pt will complete LB dressing with Max A OT Short Term Goal 3 (Week 1): Pt will complete UB dressing with Mod A  Skilled Therapeutic Interventions/Progress Updates:      Therapy Documentation Precautions:  Precautions Precautions: Fall Precaution Comments: paraplegia Restrictions Weight Bearing Restrictions: No General: "I didn't sleep last night either." Pt supine in bed upon OT arrival, agreeable to OT session.  Pain:  5/10 pain reported in Lt arm, activity, intermittent rest breaks, distractions provided for pain management, pt reports tolerable to proceed.   ADL: Bed mobility: Pt completing multiple trials of rolling bed mobility in order to prepare for ADLs such as LB dressing. Pt managing leg into flexed position with leg loops and using bed rail to roll with CGA to Lt and Rt sides. Pt when rolled, released rail with one hand to simulate managing pants. Pt supine><EOB Max A with attempting to use leg loops for managing LE.  Balance: Pt seated EOB attempting unilateral supported sitting in order to increase trunk support in preparation for completing ADLS.  Pt supine in bed with bed alarm activated, 2 bed rails up, call light within reach and 4Ps assessed.   Session 2  General: "Oh me." Pt seated in W/C upon OT arrival, agreeable to OT.  Pain: no pain reported. Pt reported decreased pain from prior session from KT tape applied.   ADL:  Transfers: total A +2 transfer board transfer with bilateral knee block W/C>bed  Bed mobility: CGA with  rolling Lt and Rt  Other Treatments: Pt disclosed concerns for home going d/t increased care related to ADLs and mobility. Pt and OT discussed potential home health aide options for respite for daughters in order to split care responsibilities. Pt and OT discussed therapeutic course of care for increased motivation. OT told patient of increased progress seen within therapy session thus far.   Pt supine in bed with bed alarm activated, 2 bed rails up, call light within reach and 4Ps assessed.   Therapy/Group: Individual Therapy  Heather Molina, OTD, OTR/L 12/16/2022, 4:28 PM

## 2022-12-16 NOTE — Progress Notes (Signed)
Had a fairly quite night. Woke up in between sleep. Bowel program (BP) done yesterday and was successful. Patient had two BM post BP. In and out cath as indicated. Wound dressing to sacrum changed. Safety maintained at all times.

## 2022-12-16 NOTE — Progress Notes (Signed)
Physical Therapy Session Note  Patient Details  Name: Heather Molina MRN: [id number] Date of Birth: 06-23-1940  Today's Date: 12/16/2022 PT Individual Time: 0800-0900 PT Individual Time Calculation (min): 60 min   Short Term Goals: Week 1:  PT Short Term Goal 1 (Week 1): Pt will require mod A with rolling PT Short Term Goal 2 (Week 1): Pt will require mod A with supine <>sit PT Short Term Goal 3 (Week 1): Pt will require mod A for static sitting balance x 1 minute PT Short Term Goal 4 (Week 1): Pt will require mod A for slide board transfer from bed<>chair  Skilled Therapeutic Interventions/Progress Updates:      Therapy Documentation Precautions:  Precautions Precautions: Fall Precaution Comments: paraplegia Restrictions Weight Bearing Restrictions: No  Pt alert and awake throughout session compared to previous days and limited by left arm pain. PT provided rest, re-positioning and pt pre-medicated. Pt dependent for peri-care and changing brief, observed bowel smear. Pt dependent for donning of leg loops and ted hose. Pt transitioned to long sitting with HOB completely elevated and performed ipsilateral and contralateral reaching to don pants (total A) in figure four fashion. Pt total A for squat pivot transfer from new air mattress to w/c, deferred slide board due to safety concerns. Pt participated in blocked practice of slide board transfers with 3 in stool positioned under patient's feet. Pt requires max cues for sequencing, head/hips relationship and benefit of using momentum with forward truncal flexion/extension. Pt performed 2 slide board transfers with above cues and dependently transported to room and left semi-reclined in bed with all needs in reach.     Therapy/Group: Individual Therapy  Truitt Leep Truitt Leep PT, DPT  12/16/2022, 7:38 AM

## 2022-12-16 NOTE — Progress Notes (Signed)
PROGRESS NOTE   Subjective/Complaints:  Pt reports didn't sleep again last night- but did eat muffin and 1/2 eggs this AM.  Was good! Drinking at least 6 cups/water day per pt.  LBM this Am- incontinent- and then did bowel program last night.    ROS:   Pt denies SOB, abd pain, CP, N/V/C/D, and vision changes   Except for HPI  Objective:   No results found. Recent Labs    12/14/22 0600 12/15/22 0735  WBC 7.2 6.1  HGB 7.4* 7.2*  HCT 23.7* 23.3*  PLT 292 277   Recent Labs    12/14/22 0600 12/15/22 0735  NA 131* 136  K 4.2 4.3  CL 94* 97*  CO2 27 26  GLUCOSE 100* 143*  BUN 25* 25*  CREATININE 1.09* 1.08*  CALCIUM 8.5* 8.7*    Urinalysis    Component Value Date/Time   COLORURINE YELLOW 12/14/2022 1755   APPEARANCEUR CLEAR 12/14/2022 1755   LABSPEC 1.013 12/14/2022 1755   PHURINE 5.0 12/14/2022 1755   GLUCOSEU NEGATIVE 12/14/2022 1755   HGBUR NEGATIVE 12/14/2022 1755   BILIRUBINUR NEGATIVE 12/14/2022 1755   KETONESUR NEGATIVE 12/14/2022 1755   PROTEINUR NEGATIVE 12/14/2022 1755   NITRITE NEGATIVE 12/14/2022 1755   LEUKOCYTESUR NEGATIVE 12/14/2022 1755      Intake/Output Summary (Last 24 hours) at 12/16/2022 1842 Last data filed at 12/16/2022 1839 Gross per 24 hour  Intake 478 ml  Output 700 ml  Net -222 ml     Pressure Injury 12/12/22 Sacrum Medial Deep Tissue Pressure Injury - Purple or maroon localized area of discolored intact skin or blood-filled blister due to damage of underlying soft tissue from pressure and/or shear. (Active)  12/12/22 1844  Location: Sacrum  Location Orientation: Medial  Staging: Deep Tissue Pressure Injury - Purple or maroon localized area of discolored intact skin or blood-filled blister due to damage of underlying soft tissue from pressure and/or shear.  Wound Description (Comments):   Present on Admission: Yes    Physical Exam: Vital Signs Blood pressure (!)  144/55, pulse 98, temperature 97.6 F (36.4 C), resp. rate 16, height 5' (1.524 m), weight 50.2 kg, SpO2 93%.     General: awake, alert, appropriate, NAD HENT: conjugate gaze; oropharynx less dry- O2 2L by [place] CV: regular rate- and irregular rhythm; no JVD Pulmonary: CTA B/L; no W/R/R- decreased at bases GI: soft, NT, ND, (+)BS- normoactive Psychiatric: appropriate- more interactive Neurological: Ox3 Much more awake this AM   PRIOR EXAMS: Skin: Groin incision site looks okay, bruising noted bilateral shins 2 IVs in place left upper extremity looks okay  Neuro:   Mental Status: AAOx3, memory intact,  Speech/Languate:  fluent, follows simple commands CRANIAL NERVES: II: PERRL. Visual fields full III, IV, VI: EOM intact, no gaze preference or deviation V: normal sensation bilaterally VII: no asymmetry VIII: normal hearing to speech IX, X: normal palatal elevation XI: 5/5 head turn and 5/5 shoulder shrug bilaterally XII: Tongue midline     MOTOR: RUE: 5/5 Deltoid, 5/5 Biceps, 5/5 Triceps,5/5 Grip LUE: 5/5 Deltoid, 5/5 Biceps, 5/5 Triceps, 5/5 Grip RLE: 0 out of 5 throughout LLE: 0 out of 5 throughout   SENSORY:  Normal to touch all 4 extremities to light touch in bilateral upper and lower extremities, patchy altered sensation to cold in lower extremities   No ankle clonus bilaterally   Coordination: Normal finger to nose, no dysmetria  Assessment/Plan: 1. Functional deficits which require 3+ hours per day of interdisciplinary therapy in a comprehensive inpatient rehab setting. Physiatrist is providing close team supervision and 24 hour management of active medical problems listed below. Physiatrist and rehab team continue to assess barriers to discharge/monitor patient progress toward functional and medical goals  Care Tool:  Bathing    Body parts bathed by patient: Right arm, Left arm, Chest, Abdomen, Front perineal area, Face   Body parts bathed by helper:  Buttocks, Right upper leg, Left upper leg, Right lower leg, Left lower leg     Bathing assist Assist Level: Maximal Assistance - Patient 24 - 49% (bed level)     Upper Body Dressing/Undressing Upper body dressing   What is the patient wearing?: Pull over shirt    Upper body assist Assist Level: Maximal Assistance - Patient 25 - 49%    Lower Body Dressing/Undressing Lower body dressing            Lower body assist Assist for lower body dressing: Dependent - Patient 0%     Toileting Toileting    Toileting assist Assist for toileting: 2 Helpers (bed level)     Transfers Chair/bed transfer  Transfers assist  Chair/bed transfer activity did not occur: Safety/medical concerns  Chair/bed transfer assist level: Maximal Assistance - Patient 25 - 49%     Locomotion Ambulation   Ambulation assist   Ambulation activity did not occur: Safety/medical concerns (paraplegia, fatigue)          Walk 10 feet activity   Assist  Walk 10 feet activity did not occur: Safety/medical concerns (paraplegia, fatigue)        Walk 50 feet activity   Assist Walk 50 feet with 2 turns activity did not occur: Safety/medical concerns (paraplegia, fatigue)         Walk 150 feet activity   Assist Walk 150 feet activity did not occur: Safety/medical concerns (paraplegia, fatigue)         Walk 10 feet on uneven surface  activity   Assist Walk 10 feet on uneven surfaces activity did not occur: Safety/medical concerns (paraplegia, fatigue)         Wheelchair     Assist Is the patient using a wheelchair?: Yes Type of Wheelchair: Manual Wheelchair activity did not occur: Safety/medical concerns (paraplegia, fatigue)         Wheelchair 50 feet with 2 turns activity    Assist    Wheelchair 50 feet with 2 turns activity did not occur: Safety/medical concerns (paraplegia, fatigue)       Wheelchair 150 feet activity     Assist  Wheelchair 150 feet  activity did not occur: Safety/medical concerns (paraplegia, fatigue)       Blood pressure (!) 144/55, pulse 98, temperature 97.6 F (36.4 C), resp. rate 16, height 5' (1.524 m), weight 50.2 kg, SpO2 93%.  Medical Problem List and Plan: 1. 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  Antithrombotics: -DVT/anticoagulation:  Pharmaceutical: Eliquis 2.5mg  BID             -antiplatelet therapy: Aspirin 81 mg daily   3. Pain Management: Tylenol, robaxin, oxycodone as needed   4. Mood/Behavior/Sleep: LCSW to evaluate and provide emotional support             -continue paroxetine 20 mg daily (hx of depression)             -antipsychotic agents: n/a -12/13/22 hasn't been sleeping well, increase melatonin to 10mg  QHS, added Trazodone 25mg  PRN but advised to be cautious about using it too late.    10/21- will stop Trazodone- can impair ability to pee- will start Restoril 7.5 mg at bedtime for sleep.   10/22- cannot start Remeron due to risk of torsades de pointes- will con't Restoril  10/23- didn't sleep well again- will increase Restoril to 15 mg QHS 5. Neuropsych/cognition: This patient is capable of making decisions on her own behalf.   6. Skin/Wound Care: Routine skin care checks   7. Fluids/Electrolytes/Nutrition: Routine Is and Os and follow-up chemistries   8: Hypertension: monitor TID and prn (hx of a.fib see meds below). Monitor with mobility. Continue metoprolol 25mg  BID  -12/13/22 BPs a little up, monitor for trend  10/21- BP's running high up to 180s- systolic- but had documented BP 87/64 this AM- will wait to titrate BP meds. 10/22- BP still in 150s- but no LOW BP's this AM- will  see how does in therapy- 10/23- reduced Metoprolol per pt request to 12.5 mg BID- might need Norvasc for BP control without affecting HR   Vitals:   12/13/22 0254 12/13/22 1503 12/13/22 1919 12/14/22 0408  BP: (!) 149/56 (!) 152/56 (!) 162/72 (!) 178/62   12/14/22 0733 12/14/22 1623 12/14/22 2023 12/15/22 0454  BP: (!) 87/64 (!) 184/57 (!) 181/53 (!) 150/62   12/15/22 1619 12/15/22 1929 12/16/22 0358 12/16/22 1414  BP: (!) 157/44 (!) 155/70 (!) 176/59 (!) 144/55                 9: Hyperlipidemia: continue pravastatin 40mg  daily   10: COPD, O2 dependent 2L via [place]:             -continue Brovana nebs BID             -continue Yupelri neb daily   10/21- Has been on 2L O2 at home per pt and chart 11: CAD: stable on asa and statin   12: Atrial fibrillation, paroxysmal: maintained on Eliquis reversed pre-operatively             -continue amiodarone 100 mg daily             -continue metoprolol tartrate 25 mg BID             -cleared by [facility] to re-start Eliquis>pharmacy consult placed   13: 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 10/22- not voiding -U/A (-)  will add Flomax 0.4 mg q supper to see if she can void- I don't think it's likely. D/w nursing due to low amount of caths-  10/23- pt reports still getting cathed- is better 17: ABLA: stable; follow CBC trend             -consider oral iron  supplement -12/13/22 Hgb slowly downtrending from postop 8.8 on 10/14>7.6>7.5>7.3>7.0 yesterday; pt asymptomatic at this time; would recheck tomorrow but if still at the 7.0-range then would consider transfusion to help with therapy tolerance; unclear what her prior baseline was (has hgb 12.2 back in July but then down to 9's).    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  10/22- continue to refuse bowel program-in spite of education of bowel program and need for it.  10/23- pt did last night- had BM with bowel program and one this AM, but educated can take 3-6 weeks to train gut, but should be better- had bowel incontinence for 20 years   20. Poor Appetite 10/22- will add Megace for poor appetite since cannot add Remeron and too sedated to add Periactin. Will also order push fluids since not drinking great- although her BUN?Cr looking better- BUN 25 and Cr 1.08-  10/23- pt feels she's drinking 6+ cups/day- ate muffin and some eggs this AM- better than last 2 days-     I spent a total of  44  minutes on total care today- >50% coordination of care- due to  D/w PT- d/w pharmacy about Metoprolol- but might change to Coreg? Since HR 98 this evening- BP still elevated- might need to add Norvasc- significant medical decision making  LOS: 4 days A FACE TO FACE EVALUATION WAS PERFORMED  Dontea Corlew 12/16/2022, 6:42 PM

## 2022-12-17 LAB — CBC WITH DIFFERENTIAL/PLATELET
Abs Immature Granulocytes: 0.12 10*3/uL — ABNORMAL HIGH (ref 0.00–0.07)
Basophils Absolute: 0.1 10*3/uL (ref 0.0–0.1)
Basophils Relative: 1 %
Eosinophils Absolute: 0.3 10*3/uL (ref 0.0–0.5)
Eosinophils Relative: 4 %
HCT: 21.2 % — ABNORMAL LOW (ref 36.0–46.0)
Hemoglobin: 7 g/dL — ABNORMAL LOW (ref 12.0–15.0)
Immature Granulocytes: 2 %
Lymphocytes Relative: 13 %
Lymphs Abs: 0.9 10*3/uL (ref 0.7–4.0)
MCH: 27.7 pg (ref 26.0–34.0)
MCHC: 33 g/dL (ref 30.0–36.0)
MCV: 83.8 fL (ref 80.0–100.0)
Monocytes Absolute: 0.9 10*3/uL (ref 0.1–1.0)
Monocytes Relative: 12 %
Neutro Abs: 4.9 10*3/uL (ref 1.7–7.7)
Neutrophils Relative %: 68 %
Platelets: UNDETERMINED 10*3/uL (ref 150–400)
RBC: 2.53 MIL/uL — ABNORMAL LOW (ref 3.87–5.11)
RDW: 17.2 % — ABNORMAL HIGH (ref 11.5–15.5)
Smear Review: UNDETERMINED
WBC: 7.2 10*3/uL (ref 4.0–10.5)
nRBC: 0.3 % — ABNORMAL HIGH (ref 0.0–0.2)

## 2022-12-17 LAB — BASIC METABOLIC PANEL
Anion gap: 9 (ref 5–15)
BUN: 27 mg/dL — ABNORMAL HIGH (ref 8–23)
CO2: 25 mmol/L (ref 22–32)
Calcium: 8.2 mg/dL — ABNORMAL LOW (ref 8.9–10.3)
Chloride: 99 mmol/L (ref 98–111)
Creatinine, Ser: 1.11 mg/dL — ABNORMAL HIGH (ref 0.44–1.00)
GFR, Estimated: 49 mL/min — ABNORMAL LOW (ref >60)
Glucose, Bld: 94 mg/dL (ref 70–99)
Potassium: 4.1 mmol/L (ref 3.5–5.1)
Sodium: 133 mmol/L — ABNORMAL LOW (ref 135–145)

## 2022-12-17 MED ORDER — AMLODIPINE BESYLATE 2.5 MG PO TABS
2.5000 mg | ORAL_TABLET | Freq: Every day | ORAL | Status: DC
  Administered 2022-12-17 – 2022-12-29 (×13): 2.5 mg via ORAL
  Filled 2022-12-17 (×13): qty 1

## 2022-12-17 MED ORDER — MIRTAZAPINE 15 MG PO TABS
15.0000 mg | ORAL_TABLET | Freq: Every day | ORAL | Status: DC
  Administered 2022-12-17: 15 mg via ORAL
  Filled 2022-12-17: qty 1

## 2022-12-17 NOTE — Progress Notes (Signed)
Occupational Therapy Session Note  Patient Details  Name: Heather Molina MRN: [id number] Date of Birth: 30-Sep-1940  Today's Date: 12/17/2022 OT Individual Time: 1093-2355 OT Individual Time Calculation (min): 45 min    Short Term Goals: Week 1:  OT Short Term Goal 1 (Week 1): Pt will complete sitting EOB with Mod A to complete ADL task OT Short Term Goal 2 (Week 1): Pt will complete LB dressing with Max A OT Short Term Goal 3 (Week 1): Pt will complete UB dressing with Mod A  Skilled Therapeutic Interventions/Progress Updates:      Therapy Documentation Precautions:  Precautions Precautions: Fall Precaution Comments: paraplegia Restrictions Weight Bearing Restrictions: No General: "Hi honey!" Pt supine in bed upon OT arrival, agreeable to OT session. Pt with smear incontinence on brief  Pain: general soreness reported, activity, intermittent rest breaks, distractions provided for pain management, pt reports tolerable to proceed.   ADL: Bed mobility: Min A with use of grab bars Toileting: Max A bed level, able to attempt to assist with pants management over waist while rolling. Pt with smear of BM incontinence, OT assisted with clean up at bed level UB dressing: Min A donning/doffing, with assistance to pull down back while pt stabilizing on bed rails LB dressing: Max A, Pt attempted assisting over waist without leg loops, legs with increased tightness this date with less flexibility in LE to complete figure 4 method, OT provided stretching in order to decrease muscle tightness Footwear: total A for TEDs and shoes Transfers: Max +1 from air mattress>W/C with transfer board and bilateral knee block with slight downhill, VC for anterior lean to transfer   Pt seated in W/C at end of session with W/C alarm donned, call light within reach and 4Ps assessed.    Therapy/Group: Individual Therapy  [name], OTD, OTR/L 12/17/2022, 4:04 PM

## 2022-12-17 NOTE — Progress Notes (Signed)
Physical Therapy Session Note  Patient Details  Name: Heather Molina MRN: [id number] Date of Birth: Feb 07, 1941  Today's Date: 12/17/2022 PT Individual Time: 1300-1400 PT Individual Time Calculation (min): 60 min   Short Term Goals: Week 1:  PT Short Term Goal 1 (Week 1): Pt will require mod A with rolling PT Short Term Goal 2 (Week 1): Pt will require mod A with supine <>sit PT Short Term Goal 3 (Week 1): Pt will require mod A for static sitting balance x 1 minute PT Short Term Goal 4 (Week 1): Pt will require mod A for slide board transfer from bed<>chair  Skilled Therapeutic Interventions/Progress Updates:    pt received in bed and agreeable to therapy. Pt reports no pain at this time.   Bed mobility with max A after donning leg loops for trunk elevation d/t R lateral lean resembling. Dependent slideboard transfer to w/c d/t poor set up and height of bed. Pt transported to therapy gym for time management and energy conservation.   Pt then performed slideboard transfer with max A x2 for safety <>mat table. Session focused on sitting balance with emphasis on lateral leans to elbow, finding midline orientation, and block practice of lifting hips for transfer training. Pt with tendency to lean R consistently and occasionally posterior resulting in LOB.   Pt set up in w/c to await next PT session, with pillows to support in case of lean. Pt was left with all needs in reach and alarm active.   Therapy Documentation Precautions:  Precautions Precautions: Fall Precaution Comments: paraplegia Restrictions Weight Bearing Restrictions: No General:       Therapy/Group: Individual Therapy  Juluis Rainier 12/17/2022, 4:00 PM

## 2022-12-17 NOTE — Plan of Care (Signed)
  Problem: SCI BOWEL ELIMINATION Goal: RH STG MANAGE BOWEL WITH ASSISTANCE Description: STG Manage Bowel with mod Assistance. Outcome: Progressing   Problem: SCI BLADDER ELIMINATION Goal: RH STG MANAGE BLADDER WITH ASSISTANCE Description: STG Manage Bladder With mod Assistance Outcome: Progressing   Problem: RH SKIN INTEGRITY Goal: RH STG SKIN FREE OF INFECTION/BREAKDOWN Description: Incisions will continue to heal and skin be free of infection/breakdown with min assist  Outcome: Progressing   Problem: RH PAIN MANAGEMENT Goal: RH STG PAIN MANAGED AT OR BELOW PT'S PAIN GOAL Description: Less than 4 with PRN medications min assist  Outcome: Progressing

## 2022-12-17 NOTE — Progress Notes (Signed)
Physical Therapy Session Note  Patient Details  Name: Heather Molina MRN: [id number] Date of Birth: [birth year]  Today's Date: 12/17/2022 PT Individual Time: 1005-1050 PT Individual Time Calculation (min): 45 min   Short Term Goals: Week 1:  PT Short Term Goal 1 (Week 1): Pt will require mod A with rolling PT Short Term Goal 2 (Week 1): Pt will require mod A with supine <>sit PT Short Term Goal 3 (Week 1): Pt will require mod A for static sitting balance x 1 minute PT Short Term Goal 4 (Week 1): Pt will require mod A for slide board transfer from bed<>chair  Skilled Therapeutic Interventions/Progress Updates:    Pt received in [facility], alarm activated, agreeable to PT. Remained on 2/2.5 O2 throughout session via nasal cannula.  Leg loops applied on BLE(totalA) from PT to allow pt to participate in below intervention more independently. Educated patient on technique to be able to remove leg rests independently. Pt practiced x4 times w/ intermittent rest breaks inbetween sets and after moving leg rest slightly out of the way prior to fully removing. Pt requires intermittent minA for managing parts, verbal cues to remember steps, and modA postural support when reaching to the R foot plate to remove. During rest breaks minA to assist pt back to center of WC to rest on back rest.Written instructions given to patient on technique to study to improve carryover. Pt has to use both hands to remove legs from foot plate d/t strength deficits and then is able to remove footplate w/ 1 hand while other stabilizes for balance. Pt reports really liking the task to allow her to be more independent. Pt did complain of lightheadedness during task but did not limit her participation, RN notified at end of session.  Chair to bed slide board transfer to the R, +2 TotalA for safety d/t arm pain resulting in inability to press and assist. Verbal cues for head hips relationship, PT blocking BLE for safety and  weightbearing.  Posterior R LOB in static sitting on the EOB, maxA to stay upright.  Sit to supine modA for BLE management onto bed. totalA for bed positioning.  Pt left in bed, alarm activated, all needs met.  Therapy Documentation Precautions:  Precautions Precautions: Fall Precaution Comments: paraplegia Restrictions Weight Bearing Restrictions: No Pain:  Pt reports L arm pain on arrival. Unrated, reports she is already taking tylenol. Session focused on functional independent task rather than physically demanding.  Therapy/Group: Individual Therapy  Gilman Buttner 12/17/2022, 8:13 AM

## 2022-12-17 NOTE — Progress Notes (Addendum)
PROGRESS NOTE   Subjective/Complaints:  Pt reports that  couldn't get comfortable last night and so didn't sleep- LUE really bothering her.  Ate 25% of tray Said hard to eat since always leaning to side.  Only taking tylenol for pain- was wondering If other options.    ROS:    Pt denies SOB, abd pain, CP, N/V/C/D, and vision changes   Except for HPI  Objective:   No results found. Recent Labs    12/15/22 0735 12/17/22 0626  WBC 6.1 7.2  HGB 7.2* 7.0*  HCT 23.3* 21.2*  PLT 277 PLATELET CLUMPS NOTED ON SMEAR, UNABLE TO ESTIMATE   Recent Labs    12/15/22 0735 12/17/22 0626  NA 136 133*  K 4.3 4.1  CL 97* 99  CO2 26 25  GLUCOSE 143* 94  BUN 25* 27*  CREATININE 1.08* 1.11*  CALCIUM 8.7* 8.2*    Urinalysis    Component Value Date/Time   COLORURINE YELLOW 12/14/2022 1755   APPEARANCEUR CLEAR 12/14/2022 1755   LABSPEC 1.013 12/14/2022 1755   PHURINE 5.0 12/14/2022 1755   GLUCOSEU NEGATIVE 12/14/2022 1755   HGBUR NEGATIVE 12/14/2022 1755   BILIRUBINUR NEGATIVE 12/14/2022 1755   KETONESUR NEGATIVE 12/14/2022 1755   PROTEINUR NEGATIVE 12/14/2022 1755   NITRITE NEGATIVE 12/14/2022 1755   LEUKOCYTESUR NEGATIVE 12/14/2022 1755      Intake/Output Summary (Last 24 hours) at 12/17/2022 1051 Last data filed at 12/17/2022 0837 Gross per 24 hour  Intake 480 ml  Output 325 ml  Net 155 ml     Pressure Injury 12/12/22 Sacrum Medial Deep Tissue Pressure Injury - Purple or maroon localized area of discolored intact skin or blood-filled blister due to damage of underlying soft tissue from pressure and/or shear. (Active)  12/12/22 1844  Location: Sacrum  Location Orientation: Medial  Staging: Deep Tissue Pressure Injury - Purple or maroon localized area of discolored intact skin or blood-filled blister due to damage of underlying soft tissue from pressure and/or shear.  Wound Description (Comments):   Present on  Admission: Yes    Physical Exam: Vital Signs Blood pressure (!) 151/50, pulse 70, temperature (!) 97.4 F (36.3 C), resp. rate 16, height 5' (1.524 m), weight 50.2 kg, SpO2 100%.     General: awake, alert, appropriate, getting breathing treatment; NAD HENT: conjugate gaze; oropharynx  dry-still O2 2L by [place] CV: regular rate- irregular rhythm no JVD Pulmonary: CTA B/L; 1 wheeze heard- decreased at bases- GI: soft, NT, ND, (+)BS- normoactive Psychiatric: appropriate- more interactive- but c/o being tired Neurological: Ox3 Much more awake this AM still this AM   PRIOR EXAMS: Skin: Groin incision site looks okay, bruising noted bilateral shins 2 IVs in place left upper extremity looks okay  Neuro:   Mental Status: AAOx3, memory intact,  Speech/Languate:  fluent, follows simple commands CRANIAL NERVES: II: PERRL. Visual fields full III, IV, VI: EOM intact, no gaze preference or deviation V: normal sensation bilaterally VII: no asymmetry VIII: normal hearing to speech IX, X: normal palatal elevation XI: 5/5 head turn and 5/5 shoulder shrug bilaterally XII: Tongue midline     MOTOR: RUE: 5/5 Deltoid, 5/5 Biceps, 5/5 Triceps,5/5 Grip LUE: 5/5  Deltoid, 5/5 Biceps, 5/5 Triceps, 5/5 Grip RLE: 0 out of 5 throughout LLE: 0 out of 5 throughout   SENSORY: Normal to touch all 4 extremities to light touch in bilateral upper and lower extremities, patchy altered sensation to cold in lower extremities   No ankle clonus bilaterally   Coordination: Normal finger to nose, no dysmetria  Assessment/Plan: 1. Functional deficits which require 3+ hours per day of interdisciplinary therapy in a comprehensive inpatient rehab setting. Physiatrist is providing close team supervision and 24 hour management of active medical problems listed below. Physiatrist and rehab team continue to assess barriers to discharge/monitor patient progress toward functional and medical goals  Care  Tool:  Bathing    Body parts bathed by patient: Right arm, Left arm, Chest, Abdomen, Front perineal area, Face   Body parts bathed by helper: Buttocks, Right upper leg, Left upper leg, Right lower leg, Left lower leg     Bathing assist Assist Level: Maximal Assistance - Patient 24 - 49% (bed level)     Upper Body Dressing/Undressing Upper body dressing   What is the patient wearing?: Pull over shirt    Upper body assist Assist Level: Maximal Assistance - Patient 25 - 49%    Lower Body Dressing/Undressing Lower body dressing            Lower body assist Assist for lower body dressing: Dependent - Patient 0%     Toileting Toileting    Toileting assist Assist for toileting: 2 Helpers (bed level)     Transfers Chair/bed transfer  Transfers assist  Chair/bed transfer activity did not occur: Safety/medical concerns  Chair/bed transfer assist level: Maximal Assistance - Patient 25 - 49%     Locomotion Ambulation   Ambulation assist   Ambulation activity did not occur: Safety/medical concerns (paraplegia, fatigue)          Walk 10 feet activity   Assist  Walk 10 feet activity did not occur: Safety/medical concerns (paraplegia, fatigue)        Walk 50 feet activity   Assist Walk 50 feet with 2 turns activity did not occur: Safety/medical concerns (paraplegia, fatigue)         Walk 150 feet activity   Assist Walk 150 feet activity did not occur: Safety/medical concerns (paraplegia, fatigue)         Walk 10 feet on uneven surface  activity   Assist Walk 10 feet on uneven surfaces activity did not occur: Safety/medical concerns (paraplegia, fatigue)         Wheelchair     Assist Is the patient using a wheelchair?: Yes Type of Wheelchair: Manual Wheelchair activity did not occur: Safety/medical concerns (paraplegia, fatigue)         Wheelchair 50 feet with 2 turns activity    Assist    Wheelchair 50 feet with 2 turns  activity did not occur: Safety/medical concerns (paraplegia, fatigue)       Wheelchair 150 feet activity     Assist  Wheelchair 150 feet activity did not occur: Safety/medical concerns (paraplegia, fatigue)       Blood pressure (!) 151/50, pulse 70, temperature (!) 97.4 F (36.3 C), resp. rate 16, height 5' (1.524 m), weight 50.2 kg, SpO2 100%.  Medical Problem List and Plan: 1. 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  Antithrombotics: -DVT/anticoagulation:  Pharmaceutical: Eliquis 2.5mg  BID             -antiplatelet therapy: Aspirin 81 mg daily   3. Pain Management: Tylenol, robaxin, oxycodone as needed   10/24- will wait to try Tramadol for pain/discomfort til have tried  Remeron for sleep 4. Mood/Behavior/Sleep: LCSW to evaluate and provide emotional support             -continue paroxetine 20 mg daily (hx of depression)             -antipsychotic agents: n/a -12/13/22 hasn't been sleeping well, increase melatonin to 10mg  QHS, added Trazodone 25mg  PRN but advised to be cautious about using it too late.    10/21- will stop Trazodone- can impair ability to pee- will start Restoril 7.5 mg at bedtime for sleep.   10/22- cannot start Remeron due to risk of torsades de pointes- will con't Restoril  10/23- didn't sleep well again- will increase Restoril to 15 mg at bedtime  10/24- spoke to Cards- they are ok trying Remeron but wait on Tramadol-will start 15 mg at bedtime -will stop Restoril  5. Neuropsych/cognition: This patient is capable of making decisions on her own behalf.   6. Skin/Wound Care: Routine skin care checks   7. Fluids/Electrolytes/Nutrition: Routine Is  and Os and follow-up chemistries   8: Hypertension: monitor TID and prn (hx of a.fib see meds below). Monitor with mobility. Continue metoprolol 25mg  BID  -12/13/22 BPs a little up, monitor for trend  10/21- BP's running high up to 180s- systolic- but had documented BP 87/64 this AM- will wait to titrate BP meds. 10/22- BP still in 150s- but no LOW BP's this AM- will see how does in therapy- 10/23- reduced Metoprolol per pt request to 12.5 mg BID- might need Norvasc for BP control without affecting HR  10/24- will add Norvasc 2.5 mg daily since BP elevated and not coming down  Vitals:   12/14/22 0408 12/14/22 0733 12/14/22 1623 12/14/22 2023  BP: (!) 178/62 (!) 87/64 (!) 184/57 (!) 181/53   12/15/22 0454 12/15/22 1619 12/15/22 1929 12/16/22 0358  BP: (!) 150/62 (!) 157/44 (!) 155/70 (!) 176/59   12/16/22 1414 12/16/22 2001 12/17/22 0341 12/17/22 0913  BP: (!) 144/55 (!) 153/57 (!) 161/55 (!) 151/50                 9: Hyperlipidemia: continue pravastatin 40mg  daily   10: COPD, O2 dependent 2L via [place]:             -continue Brovana nebs BID             -continue Yupelri neb daily   10/21- Has been on 2L O2 at home per pt and chart 11: CAD: stable on asa and statin   12: Atrial fibrillation, paroxysmal: maintained on Eliquis reversed pre-operatively             -continue amiodarone 100 mg daily             -continue metoprolol tartrate 25 mg BID             -cleared by [facility] to re-start Eliquis>pharmacy consult placed  13: 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 10/22- not voiding -U/A (-)  will add Flomax 0.4 mg q supper to see if she can void- I don't think it's likely. D/w nursing due to low amount of caths-  10/23- pt reports still getting cathed- is better 10/24- pt swears drinking 6 cups/water day, however BUN up to 27 and Cr 1.11- and cath volumes very low- not clear if having incontinence of bladder 17: ABLA: stable; follow CBC trend             -consider oral iron supplement -12/13/22 Hgb slowly downtrending from postop 8.8 on 10/14>7.6>7.5>7.3>7.0 yesterday; pt asymptomatic at this time; would recheck tomorrow but if still at the 7.0-range then would consider transfusion to help with therapy tolerance; unclear what her prior baseline was (has hgb 12.2 back in July but then down to 9's).    10/21- Hb 7.4 today  10/23- Hb 7.2 yesterday- will check in AM  10/24- Hb down to 7.0- will d/w pt if she wants to be transfused. If so, will give 1 unit pRBCs.  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  10/22- continue to refuse bowel program-in spite of education of bowel program and need for it.  10/23- pt did last night- had BM with bowel program and one this AM, but educated can take 3-6 weeks to train gut, but should be  better- had bowel incontinence for 20 years 10/24- no bowel program documented  but had small BM's at midnight and 5am- said wasn't incontinent?will check on this 20. Poor Appetite 10/22- will add Megace for poor appetite since cannot add Remeron and too sedated to add Periactin. Will also order push fluids since not drinking great- although her BUN?Cr looking better- BUN 25 and Cr 1.08-  10/23- pt feels she's drinking 6+ cups/day- ate muffin and some eggs this AM- better than last 2 days-  10/24- 25% of tray at most- will add Remeron 15 mg at bedtime- ok'd by Cards.   21. Prolonged QT interval 10/24- spoke with Cards- they suggested starting Remeron 15 mg at bedtime and then rechecking EKG ina few days to see if can add Tramadol.      I spent a  total of 52   minutes on total care today- >50% coordination of care- due to  D/w Cards about QT interval- also with nursing about intake and poor appetite/intake/output. - reviewed vitals and added meds for elevated BP-will also get pt likely transfusion for Hb of 7.0- and FOBT- Also d/w nursing to cath no matter what q6 hours. Doesn't want transfusion- and wants to wait.    LOS: 5 days A FACE TO FACE EVALUATION WAS PERFORMED  Caulin Begley 12/17/2022, 10:51 AM

## 2022-12-17 NOTE — Progress Notes (Signed)
Physical Therapy Session Note  Patient Details  Name: Heather Molina MRN: [id number] Date of Birth: Jul 31, 1940  Today's Date: 12/17/2022 PT Individual Time: 0865-7846 PT Individual Time Calculation (min): 50 min   Short Term Goals: Week 1:  PT Short Term Goal 1 (Week 1): Pt will require mod A with rolling PT Short Term Goal 2 (Week 1): Pt will require mod A with supine <>sit PT Short Term Goal 3 (Week 1): Pt will require mod A for static sitting balance x 1 minute PT Short Term Goal 4 (Week 1): Pt will require mod A for slide board transfer from bed<>chair  Skilled Therapeutic Interventions/Progress Updates:      Therapy Documentation Precautions:  Precautions Precautions: Fall Precaution Comments: paraplegia Restrictions Weight Bearing Restrictions: No  Pt agreeable to PT session and reports fatigue and left arm soreness, provided repositioning and rest breaks for relief. Pt propelled w/c ~50 ft with mod cues to increase stride length. PT applied thera band to w/c rims to facilitate improved grip strength with w/c propulsion and pt mobilized w/c ~50 ft and transported dependently remaining distances in session. Pt performed reverse crunches in w/c to address postural and trunk control deficits as pt with increased right lateral lean. Pt dependent for transfer to bed and left with all needs in reach.   Therapy/Group: Individual Therapy  Truitt Leep Truitt Leep PT, DPT  12/17/2022, 7:56 AM

## 2022-12-18 ENCOUNTER — Other Ambulatory Visit: Payer: Self-pay

## 2022-12-18 ENCOUNTER — Inpatient Hospital Stay (HOSPITAL_COMMUNITY): Payer: Medicare Other

## 2022-12-18 LAB — BASIC METABOLIC PANEL
Anion gap: 9 (ref 5–15)
BUN: 22 mg/dL (ref 8–23)
CO2: 26 mmol/L (ref 22–32)
Calcium: 8.4 mg/dL — ABNORMAL LOW (ref 8.9–10.3)
Chloride: 100 mmol/L (ref 98–111)
Creatinine, Ser: 0.9 mg/dL (ref 0.44–1.00)
GFR, Estimated: >60 mL/min (ref >60)
Glucose, Bld: 92 mg/dL (ref 70–99)
Potassium: 3.9 mmol/L (ref 3.5–5.1)
Sodium: 135 mmol/L (ref 135–145)

## 2022-12-18 LAB — CBC WITH DIFFERENTIAL/PLATELET
Abs Immature Granulocytes: 0.05 10*3/uL (ref 0.00–0.07)
Basophils Absolute: 0 10*3/uL (ref 0.0–0.1)
Basophils Relative: 0 %
Eosinophils Absolute: 0.2 10*3/uL (ref 0.0–0.5)
Eosinophils Relative: 2 %
HCT: 25.8 % — ABNORMAL LOW (ref 36.0–46.0)
Hemoglobin: 8.1 g/dL — ABNORMAL LOW (ref 12.0–15.0)
Immature Granulocytes: 1 %
Lymphocytes Relative: 8 %
Lymphs Abs: 0.8 10*3/uL (ref 0.7–4.0)
MCH: 26.9 pg (ref 26.0–34.0)
MCHC: 31.4 g/dL (ref 30.0–36.0)
MCV: 85.7 fL (ref 80.0–100.0)
Monocytes Absolute: 0.9 10*3/uL (ref 0.1–1.0)
Monocytes Relative: 9 %
Neutro Abs: 7.8 10*3/uL — ABNORMAL HIGH (ref 1.7–7.7)
Neutrophils Relative %: 80 %
Platelets: 350 10*3/uL (ref 150–400)
RBC: 3.01 MIL/uL — ABNORMAL LOW (ref 3.87–5.11)
RDW: 17.2 % — ABNORMAL HIGH (ref 11.5–15.5)
WBC: 9.8 10*3/uL (ref 4.0–10.5)
nRBC: 0 % (ref 0.0–0.2)

## 2022-12-18 LAB — URINALYSIS, W/ REFLEX TO CULTURE (INFECTION SUSPECTED)
Bilirubin Urine: NEGATIVE
Glucose, UA: NEGATIVE mg/dL
Ketones, ur: NEGATIVE mg/dL
Nitrite: NEGATIVE
Protein, ur: NEGATIVE mg/dL
Specific Gravity, Urine: 1.008 (ref 1.005–1.030)
WBC, UA: >50 WBC/hpf (ref 0–5)
pH: 7 (ref 5.0–8.0)

## 2022-12-18 LAB — BLOOD GAS, ARTERIAL
Acid-Base Excess: 3.9 mmol/L — ABNORMAL HIGH (ref 0.0–2.0)
Bicarbonate: 27.7 mmol/L (ref 20.0–28.0)
Drawn by: 441371
O2 Saturation: 99.4 %
Patient temperature: 36.4
pCO2 arterial: 37 mm[Hg] (ref 32–48)
pH, Arterial: 7.48 — ABNORMAL HIGH (ref 7.35–7.45)
pO2, Arterial: 109 mm[Hg] — ABNORMAL HIGH (ref 83–108)

## 2022-12-18 MED ORDER — SODIUM CHLORIDE 0.9% FLUSH: 3.0000 mL | Freq: Two times a day (BID) | INTRAVENOUS | Status: DC

## 2022-12-18 MED ORDER — SODIUM CHLORIDE 0.9 % IV SOLN: 250.0000 mL | INTRAVENOUS | Status: AC | PRN

## 2022-12-18 MED ORDER — MIRTAZAPINE 15 MG PO TABS
7.5000 mg | ORAL_TABLET | Freq: Every day | ORAL | Status: DC
  Administered 2022-12-18 – 2022-12-22 (×5): 7.5 mg via ORAL
  Filled 2022-12-18 (×5): qty 1

## 2022-12-18 MED ORDER — SODIUM CHLORIDE 0.9% FLUSH: 3.0000 mL | INTRAVENOUS | Status: DC | PRN

## 2022-12-18 MED ORDER — SODIUM CHLORIDE 1 G PO TABS
1.0000 g | ORAL_TABLET | Freq: Every day | ORAL | Status: DC
  Administered 2022-12-19 – 2023-01-12 (×24): 1 g via ORAL
  Filled 2022-12-18 (×26): qty 1

## 2022-12-18 NOTE — Progress Notes (Signed)
EKG ordered as family reported elevated HR on pulse Ox. Reviewed EKG with Dr. Flora Lipps who felt that ST changes due to LVH and no new changes.

## 2022-12-18 NOTE — Progress Notes (Signed)
PROGRESS NOTE   Subjective/Complaints:  Pt reports slept 4 hours (per sleep chart at least 7 hours)- said peed some in brief- and cathed for rest- had bowel accident this AM after results with bowel program last night.   We discussed how overflow incontinence is usually a sign of retention, not actual voiding.    ROS:    Pt denies SOB, abd pain, CP, N/V/C/D, and vision changes    Except for HPI  Objective:   DG Chest 2 View  Result Date: 12/18/2022 CLINICAL DATA:  Mental status alteration EXAM: CHEST - 2 VIEW COMPARISON:  12/06/2022 x-ray FINDINGS: Interval placement of the thoracic endograft. The aorta continues to have a tortuous ectatic course. Stable cardiopericardial silhouette. Vascular congestion now seen. No pneumothorax or consolidation. Small effusions posteriorly identified on the lateral view. No frank edema. Curvature and degenerative changes of the spine. IMPRESSION: Interval placement of aortic endograft.  Stable cardiac silhouette. Vascular congestion.  Small effusions. Electronically Signed   By: Karen Kays M.D.   On: 12/18/2022 17:28   Recent Labs    12/17/22 0626 12/18/22 0653  WBC 7.2 9.8  HGB 7.0* 8.1*  HCT 21.2* 25.8*  PLT PLATELET CLUMPS NOTED ON SMEAR, UNABLE TO ESTIMATE 350   Recent Labs    12/17/22 0626 12/18/22 0653  NA 133* 135  K 4.1 3.9  CL 99 100  CO2 25 26  GLUCOSE 94 92  BUN 27* 22  CREATININE 1.11* 0.90  CALCIUM 8.2* 8.4*    Urinalysis    Component Value Date/Time   COLORURINE YELLOW 12/14/2022 1755   APPEARANCEUR CLEAR 12/14/2022 1755   LABSPEC 1.013 12/14/2022 1755   PHURINE 5.0 12/14/2022 1755   GLUCOSEU NEGATIVE 12/14/2022 1755   HGBUR NEGATIVE 12/14/2022 1755   BILIRUBINUR NEGATIVE 12/14/2022 1755   KETONESUR NEGATIVE 12/14/2022 1755   PROTEINUR NEGATIVE 12/14/2022 1755   NITRITE NEGATIVE 12/14/2022 1755   LEUKOCYTESUR NEGATIVE 12/14/2022 1755       Intake/Output Summary (Last 24 hours) at 12/18/2022 1933 Last data filed at 12/18/2022 1858 Gross per 24 hour  Intake 960 ml  Output 2000 ml  Net -1040 ml     Pressure Injury 12/12/22 Sacrum Medial Deep Tissue Pressure Injury - Purple or maroon localized area of discolored intact skin or blood-filled blister due to damage of underlying soft tissue from pressure and/or shear. (Active)  12/12/22 1844  Location: Sacrum  Location Orientation: Medial  Staging: Deep Tissue Pressure Injury - Purple or maroon localized area of discolored intact skin or blood-filled blister due to damage of underlying soft tissue from pressure and/or shear.  Wound Description (Comments):   Present on Admission: Yes    Physical Exam: Vital Signs Blood pressure (!) 170/62, pulse 72, temperature 98.1 F (36.7 C), temperature source Axillary, resp. rate 20, height 5' (1.524 m), weight 50.2 kg, SpO2 100%.     Pt seen at 7;50 am  General: awake, alert, appropriate, still appears frail, but more more interactive; NAD HENT: conjugate gaze; oropharynx dry- O2 by [place] 2L;  CV: regular rate and in afib; no JVD Pulmonary: CTA B/L; no W/R/R-decreased at bases like normal GI: soft, NT, ND, (+)BS- hypoactive Psychiatric:  appropriate- a little quiet Neurological: Ox3 Most awake she's been since here PRIOR EXAMS: Skin: Groin incision site looks okay, bruising noted bilateral shins 2 IVs in place left upper extremity looks okay  Neuro:   Mental Status: AAOx3, memory intact,  Speech/Languate:  fluent, follows simple commands CRANIAL NERVES: II: PERRL. Visual fields full III, IV, VI: EOM intact, no gaze preference or deviation V: normal sensation bilaterally VII: no asymmetry VIII: normal hearing to speech IX, X: normal palatal elevation XI: 5/5 head turn and 5/5 shoulder shrug bilaterally XII: Tongue midline     MOTOR: RUE: 5/5 Deltoid, 5/5 Biceps, 5/5 Triceps,5/5 Grip LUE: 5/5 Deltoid, 5/5 Biceps, 5/5  Triceps, 5/5 Grip RLE: 0 out of 5 throughout LLE: 0 out of 5 throughout   SENSORY: Normal to touch all 4 extremities to light touch in bilateral upper and lower extremities, patchy altered sensation to cold in lower extremities   No ankle clonus bilaterally   Coordination: Normal finger to nose, no dysmetria  Assessment/Plan: 1. Functional deficits which require 3+ hours per day of interdisciplinary therapy in a comprehensive inpatient rehab setting. Physiatrist is providing close team supervision and 24 hour management of active medical problems listed below. Physiatrist and rehab team continue to assess barriers to discharge/monitor patient progress toward functional and medical goals  Care Tool:  Bathing    Body parts bathed by patient: Right arm, Left arm, Chest, Abdomen, Front perineal area, Face, Right upper leg, Left upper leg   Body parts bathed by helper: Right lower leg, Left lower leg, Buttocks     Bathing assist Assist Level: Minimal Assistance - Patient > 75% (sitting in roll in shower chair)     Upper Body Dressing/Undressing Upper body dressing   What is the patient wearing?: Pull over shirt    Upper body assist Assist Level: Minimal Assistance - Patient > 75%    Lower Body Dressing/Undressing Lower body dressing      What is the patient wearing?: Incontinence brief, Pants     Lower body assist Assist for lower body dressing: Total Assistance - Patient < 25%     Toileting Toileting    Toileting assist Assist for toileting: Total Assistance - Patient < 25%     Transfers Chair/bed transfer  Transfers assist  Chair/bed transfer activity did not occur: Safety/medical concerns  Chair/bed transfer assist level: Total Assistance - Patient < 25% (squat pivot and SB)     Locomotion Ambulation   Ambulation assist   Ambulation activity did not occur: Safety/medical concerns (paraplegia, fatigue)          Walk 10 feet activity   Assist   Walk 10 feet activity did not occur: Safety/medical concerns (paraplegia, fatigue)        Walk 50 feet activity   Assist Walk 50 feet with 2 turns activity did not occur: Safety/medical concerns (paraplegia, fatigue)         Walk 150 feet activity   Assist Walk 150 feet activity did not occur: Safety/medical concerns (paraplegia, fatigue)         Walk 10 feet on uneven surface  activity   Assist Walk 10 feet on uneven surfaces activity did not occur: Safety/medical concerns (paraplegia, fatigue)         Wheelchair     Assist Is the patient using a wheelchair?: Yes Type of Wheelchair: Manual Wheelchair activity did not occur: Safety/medical concerns (paraplegia, fatigue)         Wheelchair 50 feet with 2 turns  activity    Assist    Wheelchair 50 feet with 2 turns activity did not occur: Safety/medical concerns (paraplegia, fatigue)       Wheelchair 150 feet activity     Assist  Wheelchair 150 feet activity did not occur: Safety/medical concerns (paraplegia, fatigue)       Blood pressure (!) 170/62, pulse 72, temperature 98.1 F (36.7 C), temperature source Axillary, resp. rate 20, height 5' (1.524 m), weight 50.2 kg, SpO2 100%.  Medical Problem List and Plan: 1. 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  Antithrombotics: -DVT/anticoagulation:  Pharmaceutical: Eliquis 2.5mg  BID             -antiplatelet therapy: Aspirin 81 mg daily   3. Pain Management: Tylenol, robaxin, oxycodone as needed   10/24- will wait to try Tramadol for pain/discomfort til have tried  Remeron for sleep 4. Mood/Behavior/Sleep: LCSW to evaluate and provide emotional support             -continue paroxetine 20 mg daily (hx  of depression)             -antipsychotic agents: n/a -12/13/22 hasn't been sleeping well, increase melatonin to 10mg  QHS, added Trazodone 25mg  PRN but advised to be cautious about using it too late.    10/21- will stop Trazodone- can impair ability to pee- will start Restoril 7.5 mg at bedtime for sleep.   10/22- cannot start Remeron due to risk of torsades de pointes- will con't Restoril  10/23- didn't sleep well again- will increase Restoril to 15 mg at bedtime  10/24- spoke to Cards- they are ok trying Remeron but wait on Tramadol-will start 15 mg at bedtime -will stop Restoril  5. Neuropsych/cognition: This patient is capable of making decisions on her own behalf.   6. Skin/Wound Care: Routine skin care checks   7. Fluids/Electrolytes/Nutrition: Routine Is and Os and follow-up chemistries   8: Hypertension: monitor TID and prn (hx of a.fib see meds below). Monitor with mobility. Continue metoprolol 25mg  BID  -12/13/22 BPs a little up, monitor for trend  10/21- BP's running high up to 180s- systolic- but had documented BP 87/64 this AM- will wait to titrate BP meds. 10/22- BP still in 150s- but no LOW BP's this AM- will see how does in therapy- 10/23- reduced Metoprolol per pt request to 12.5 mg BID- might need Norvasc for BP control without affecting HR  10/24- will add Norvasc 2.5 mg daily since BP elevated and not coming down  10/25- calling IM- maybe they can help- just added Norvasc yesterday Vitals:   12/15/22 1929 12/16/22 0358 12/16/22 1414 12/16/22 2001  BP: (!) 155/70 (!) 176/59 (!) 144/55 (!) 153/57   12/17/22 0341 12/17/22 0913 12/17/22 1435 12/17/22 1718  BP: (!) 161/55 (!) 151/50 (!) 136/50 (!) 165/52   12/17/22 1924 12/18/22 0712 12/18/22 1434 12/18/22 1501  BP: (!) 156/55 (!) 156/51 (!) 161/61 (!) 170/62                 9: Hyperlipidemia: continue pravastatin 40mg  daily   10: COPD, O2  dependent 2L via [place]:             -continue Brovana nebs BID              -continue Yupelri neb daily   10/21- Has been on 2L O2 at home per pt and chart  10/25- ABG shows CO2 too low- needs to reduce O2 levels 11: CAD: stable on asa and statin   12: Atrial fibrillation, paroxysmal: maintained on Eliquis reversed pre-operatively             -continue amiodarone 100 mg daily             -continue metoprolol tartrate 25 mg BID             -cleared by [facility] to re-start Eliquis>pharmacy consult placed   10/25- reduced Metoprolol to 12.5 mg BID earlier in week - doing better 13: 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 10/22- not voiding -U/A (-)  will add Flomax 0.4 mg q supper to see if she can void- I don't think it's likely. D/w nursing due to low amount of caths-  10/23- pt reports still getting cathed- is better 10/24- pt swears drinking 6 cups/water day, however BUN up to 27 and Cr 1.11- and cath volumes very low- not clear if having incontinence of bladder 10/25- pt said incontinent of bladder as well as cath overnight- monitor 17: ABLA: stable; follow CBC trend             -consider oral iron supplement -12/13/22 Hgb slowly downtrending from postop 8.8 on 10/14>7.6>7.5>7.3>7.0 yesterday; pt asymptomatic at this time; would recheck tomorrow but if still at the 7.0-range then would consider transfusion to help with therapy tolerance; unclear what her prior baseline was (has hgb  12.2 back in July but then down to 9's).    10/21- Hb 7.4 today  10/23- Hb 7.2 yesterday- will check in AM  10/24- Hb down to 7.0- will d/w pt if she wants to be transfused. If so, will give 1 unit pRBCs.  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  10/22- continue to refuse bowel program-in spite of education of bowel program and need for it.  10/23- pt did last night- had BM with bowel program and one this AM, but educated can take 3-6 weeks to train gut, but should be better- had bowel incontinence for 20 years 10/24- no bowel program documented  but had small BM's at midnight and 5am- said wasn't incontinent?will check on this 10/25- asked pt to get more dig stim this evening since likely cause of bowel incontinent in AM 20. Poor Appetite 10/22- will add Megace for poor appetite since cannot add Remeron and too sedated to add Periactin. Will also order push fluids since not drinking great- although her BUN?Cr looking better- BUN 25 and Cr 1.08-  10/23- pt feels she's drinking 6+ cups/day- ate muffin and some eggs this AM- better than last 2 days-  10/24- 25% of tray at most- will add Remeron 15 mg at bedtime- ok'd by Cards.  10/25- changed remeron to 7.5 mg QHS  21. Prolonged QT interval 10/24- spoke with Cards- they  suggested starting Remeron 15 mg at bedtime and then rechecking EKG ina few days to see if can add Tramadol.    22. Oversedation/lethargy  10/25- septis and ABG work up- when speaking with PA- appeared to have ST elevation- asked them to call IM consult-     I spent a total of 57   minutes on total care today- >50% coordination of care- due to  D/w PA x3 about pt's sedation this afternoon- and concern about oversedation-  Dealing with lethargy- oversedation, (but not due to meds, based on chart review)   LOS: 6 days A FACE TO FACE EVALUATION WAS PERFORMED  Heather Molina 12/18/2022, 7:33 PM

## 2022-12-18 NOTE — Progress Notes (Signed)
At start of shift, patient alert and oriented to 4. Toward end of second therapy session, patient became sleepy and began to speak incoherently, as if hallucinating.  Patient sleeping soundly, arousable, but disoriented and quick to fall back asleep. Vitals signs taken and PA updated. New orders given. Daughter reported similar pattern of speech during phone call last night. Toward end of shift, patient again able to correctly answer orientation questions, but remains sleepy.

## 2022-12-18 NOTE — Progress Notes (Signed)
Physical Therapy Session Note  Patient Details  Name: Heather Molina MRN: [id number] Date of Birth: Oct 29, 1940  Today's Date: 12/18/2022 PT Individual Time: 1300-1405 PT Individual Time Calculation (min): 65 min   Short Term Goals: Week 1:  PT Short Term Goal 1 (Week 1): Pt will require mod A with rolling PT Short Term Goal 2 (Week 1): Pt will require mod A with supine <>sit PT Short Term Goal 3 (Week 1): Pt will require mod A for static sitting balance x 1 minute PT Short Term Goal 4 (Week 1): Pt will require mod A for slide board transfer from bed<>chair  Skilled Therapeutic Interventions/Progress Updates: Pt presents supine in bed and finished w/ lunch.  Pt agreeable to therapy although states fatigue. Leg loops donned w/ total A.  Pt encouraged to use leg loops to bring LES to EOB.  Pt still requires mod A for completion.  Pt required max A for sidelying to sit and then to maintain balance w/ mod/max A.  Pt w/ forward flexed trunk.  Pt required max to total A for squat pivot transfer bed > TIS, w/ blocking knees.  Pt required max A to scoot back into seat.  O2 sats initially at 87% but increased to 100% w/ 2 LPM.  Pt wheeled to main gym for adjustment of foot rest.  Pt falling asleep and talking about/to person not in room.  Pt returned to room and performed SB transfer to R w/ 3" platform under feet w/ max A.  Pt required max A for sit to supine transfer.  Pt stating "party here last night" but correctly stating she is in "[facility]."  B PRAFOs donned for heel elevation.  Spoke w/ nursing re: findings during therapy session.  Missed time 10 min 2/2 fatigue.     Therapy Documentation Precautions:  Precautions Precautions: Fall Precaution Comments: paraplegia Restrictions Weight Bearing Restrictions: No General: PT Amount of Missed Time (min): 10 Minutes PT Missed Treatment Reason: Patient fatigue (asleep.) Vital Signs:   Pain:0/10      Therapy/Group: Individual  Therapy  Lucio Edward 12/18/2022, 2:16 PM

## 2022-12-18 NOTE — Plan of Care (Signed)
  Problem: SCI BOWEL ELIMINATION Goal: RH STG MANAGE BOWEL WITH ASSISTANCE Description: STG Manage Bowel with mod Assistance. Outcome: Progressing   Problem: SCI BLADDER ELIMINATION Goal: RH STG MANAGE BLADDER WITH ASSISTANCE Description: STG Manage Bladder With mod Assistance Outcome: Progressing   Problem: RH SKIN INTEGRITY Goal: RH STG ABLE TO PERFORM INCISION/WOUND CARE W/ASSISTANCE Description: STG Able To Perform Incision/Wound Care With min Assistance. Outcome: Progressing   Problem: RH SAFETY Goal: RH STG ADHERE TO SAFETY PRECAUTIONS W/ASSISTANCE/DEVICE Description: STG Adhere to Safety Precautions With cueing Assistance/Device. Outcome: Progressing

## 2022-12-18 NOTE — Progress Notes (Addendum)
Discussed earlier this afternoon at approximately 16:50 hrs regarding patient lethargy. On exam, she is somnolent and awakens briefly to voice and touch. Snoring loudly. RN reports dropping O2 sats intermittently. BP stable. RRR. ABG obtained.   Arterial Blood Gas result:  pO2 109; pCO2 37; pH 7.48;  HCO3 27.7, %O2 Sat 99.4.   CXR:  Narrative & Impression  CLINICAL DATA:  Mental status alteration   EXAM: CHEST - 2 VIEW   COMPARISON:  12/06/2022 x-ray   FINDINGS: Interval placement of the thoracic endograft. The aorta continues to have a tortuous ectatic course. Stable cardiopericardial silhouette. Vascular congestion now seen. No pneumothorax or consolidation. Small effusions posteriorly identified on the lateral view. No frank edema. Curvature and degenerative changes of the spine.   IMPRESSION: Interval placement of aortic endograft.  Stable cardiac silhouette.   Vascular congestion.  Small effusions.     Electronically Signed   By: Karen Kays M.D.   On: 12/18/2022 17:28     Contacted [facility] for consultation regarding acute delirium. CT head performed and formal report is pending. Rec: psych eval. UA, TSH order in place, pending. Updated family.  10:53 pm: UA positive for moderate leukocytes (>50), mucous, bacteria. Spoke with pharmacist regarding allergy to ceftriaxone (2021). She has had Ancef and cefepime recently without incident. I feel best to defer to discuss initiation of appropriate antibiotic therapy until the morning with MD and family.

## 2022-12-18 NOTE — Progress Notes (Signed)
Occupational Therapy Session Note  Patient Details  Name: Heather Molina MRN: [id number] Date of Birth: [birth year]  Today's Date: 12/18/2022 OT Individual Time: 0930-1040 OT Individual Time Calculation (min): 70 min    Short Term Goals: Week 1:  OT Short Term Goal 1 (Week 1): Pt will complete sitting EOB with Mod A to complete ADL task OT Short Term Goal 2 (Week 1): Pt will complete LB dressing with Max A OT Short Term Goal 3 (Week 1): Pt will complete UB dressing with Mod A Week 2:     Skilled Therapeutic Interventions/Progress Updates:    1:1 Pt received in the bed. Pt had been asleep in the bed and was confused- she asked me to use my phone to call her family to pick her up from the store. Once gently reoriented her she was back to her baseline. Pt reports not sleeping well at night. Pt agreeable to showering today. Pt transferred from bed to TIS shower chair with maxi move. Pt was able to roll in bed with mod A; requiring A to manage her LEs for lift pad to be placed. In the shower chair pt able to doff shirt in chair with min A for balance laterally and to come forward (off back of chair). Pt able to bathe sitting in the chair with cut out with min A- requiring A for lower legs and buttocks. Pt appreciative of the shower. Pt then donned clean shirt with min A with extra time - assistance for balance and for pulling down over her chest. Pt returned to the bed for LB dressing with hoyer lift. Pt fell back asleep when OT donning new heel pads and TEDS - and threading pants. Pt easily awoken and able to roll again with mod A for pulling up pants.   Goal today between therapies to try to tolerate sitting up in chair- obtained a TIS for ease of pressure relief for staff - pt was not able to demonstrate pressure relief independently today.   2nd session: Pt unable to participate in the session this afternoon - pt somnolent this afternoon and difficulty maintaining alertness for active participation  in session.   Missed 45 min vitals taken and RN in the room and aware.  PA also aware.   Therapy Documentation Precautions:  Precautions Precautions: Fall Precaution Comments: paraplegia Restrictions Weight Bearing Restrictions: No  Pain: Pain Assessment Pain Scale: 0-10 Pain Score: 0-No pain   Therapy/Group: Individual Therapy  Roney Mans [facility] 12/18/2022, 10:47 AM

## 2022-12-19 DIAGNOSIS — R0989 Other specified symptoms and signs involving the circulatory and respiratory systems: Secondary | ICD-10-CM

## 2022-12-19 LAB — TSH: TSH: 1.726 u[IU]/mL (ref 0.350–4.500)

## 2022-12-19 LAB — T4, FREE: Free T4: 1.02 ng/dL (ref 0.61–1.12)

## 2022-12-19 MED ORDER — SULFAMETHOXAZOLE-TRIMETHOPRIM 800-160 MG PO TABS
1.0000 | ORAL_TABLET | Freq: Two times a day (BID) | ORAL | Status: AC
  Administered 2022-12-19 – 2022-12-23 (×10): 1 via ORAL
  Filled 2022-12-19 (×10): qty 1

## 2022-12-19 NOTE — Progress Notes (Addendum)
PROGRESS NOTE   Subjective/Complaints:  Pt doing ok, slept "wonderfully". Denies any pain. Unsure of LBM but looks like she had one 12/17/22 as the last time it was documented. States she's urinating fine but pt a little sleepy this morning. Denies any other complaints or concerns today.   Addendum 12:45pm: called to bedside by nursing staff, apparently this morning her BPs were different in R arm vs L arm (L arm lower) and unable to palpate L radial pulse. Unclear when this started, pt states she's been able to feel her L radial pulse in the past; hasn't ever been told her BPs were different in each arm. Noted she had some discomfort in her L deltoid yesterday but that seems to be improved today.    ROS:    Pt denies SOB, abd pain, CP, N/V/C/D, and vision changes    Except for HPI  Objective:   CT HEAD WO CONTRAST ( )  Result Date: 12/19/2022 CLINICAL DATA:  82 year old female altered mental status. EXAM: CT HEAD WITHOUT CONTRAST TECHNIQUE: Contiguous axial images were obtained from the base of the skull through the vertex without intravenous contrast. RADIATION DOSE REDUCTION: This exam was performed according to the departmental dose-optimization program which includes automated exposure control, adjustment of the mA and/or kV according to patient size and/or use of iterative reconstruction technique. COMPARISON:  Brain MRI 02/13/2021.  Head CT 09/14/2022. FINDINGS: Brain: Cerebral volume is within normal limits for age. No midline shift, ventriculomegaly, mass effect, evidence of mass lesion, intracranial hemorrhage or evidence of cortically based acute infarction. Gray-white differentiation appears stable to findings on the 2022 MRI, with patchy white matter hypodensity most pronounced in the posterior left corona radiata and tracking to the left lentiform. Vascular: No suspicious intracranial vascular hyperdensity. Calcified  atherosclerosis at the skull base. Skull: Broad-based chronic mixed lucent and stippled bone lesion of the Left parietal calvarium (series 3, image 61), about 33 mm in length and stable since 2022, with intrinsic increased T1 signal consistent with benign etiology, hemangioma. No acute or suspicious osseous lesion. Advanced right TMJ degeneration. Sinuses/Orbits: Visualized paranasal sinuses and mastoids are stable and well aerated. Other: No acute orbit or scalp soft tissue finding. IMPRESSION: 1. No acute intracranial abnormality. 2. 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 3. Incidental left parietal bone benign hemangioma. Electronically Signed   By: Odessa Fleming M.D.   On: 12/19/2022 03:55   DG Chest 2 View  Result Date: 12/18/2022 CLINICAL DATA:  Mental status alteration EXAM: CHEST - 2 VIEW COMPARISON:  12/06/2022 x-ray FINDINGS: Interval placement of the thoracic endograft. The aorta continues to have a tortuous ectatic course. Stable cardiopericardial silhouette. Vascular congestion now seen. No pneumothorax or consolidation. Small effusions posteriorly identified on the lateral view. No frank edema. Curvature and degenerative changes of the spine. IMPRESSION: Interval placement of aortic endograft.  Stable cardiac silhouette. Vascular congestion.  Small effusions. Electronically Signed   By: Karen Kays M.D.   On: 12/18/2022 17:28   Recent Labs    12/17/22 0626 12/18/22 0653  WBC 7.2 9.8  HGB 7.0* 8.1*  HCT 21.2* 25.8*  PLT PLATELET CLUMPS NOTED  ON SMEAR, UNABLE TO ESTIMATE 350   Recent Labs    12/17/22 0626 12/18/22 0653  NA 133* 135  K 4.1 3.9  CL 99 100  CO2 25 26  GLUCOSE 94 92  BUN 27* 22  CREATININE 1.11* 0.90  CALCIUM 8.2* 8.4*    Urinalysis    Component Value Date/Time   COLORURINE YELLOW 12/18/2022 1854   APPEARANCEUR HAZY (A) 12/18/2022 1854   LABSPEC 1.008 12/18/2022 1854   PHURINE 7.0 12/18/2022 1854   GLUCOSEU NEGATIVE  12/18/2022 1854   HGBUR SMALL (A) 12/18/2022 1854   BILIRUBINUR NEGATIVE 12/18/2022 1854   KETONESUR NEGATIVE 12/18/2022 1854   PROTEINUR NEGATIVE 12/18/2022 1854   NITRITE NEGATIVE 12/18/2022 1854   LEUKOCYTESUR MODERATE (A) 12/18/2022 1854      Intake/Output Summary (Last 24 hours) at 12/19/2022 1142 Last data filed at 12/19/2022 0657 Gross per 24 hour  Intake 480 ml  Output 1300 ml  Net -820 ml         Physical Exam: Vital Signs Blood pressure 133/85, pulse 79, temperature 97.9 F (36.6 C), temperature source Oral, resp. rate 16, height 5' (1.524 m), weight 50.2 kg, SpO2 97%.  General: sleepy but easily awoken and remains awake, alert, appropriate; NAD HENT: conjugate gaze; oropharynx dry- O2 by [place] 2L;  CV: regular rate and in afib; no JVD Pulmonary: CTA B/L; no W/R/R-decreased at bases like normal GI: soft, NT, ND, (+)BS- a little hypoactive Psychiatric: appropriate- a little quiet and sleepy but easily arousable and remains awake during exam Neurological: Ox3 Extremities: unable to palpate L radial pulse but dopplers confirm pulse with rate in 60-70s, arm well perfused appearing, warm and pink, motor function preserved. Brachial pulse also unable to be felt. R radial pulse strong and palpable.  PRIOR EXAMS: Skin: Groin incision site looks okay, bruising noted bilateral shins 2 IVs in place left upper extremity looks okay  Neuro:   Mental Status: AAOx3, memory intact,  Speech/Languate:  fluent, follows simple commands CRANIAL NERVES: II: PERRL. Visual fields full III, IV, VI: EOM intact, no gaze preference or deviation V: normal sensation bilaterally VII: no asymmetry VIII: normal hearing to speech IX, X: normal palatal elevation XI: 5/5 head turn and 5/5 shoulder shrug bilaterally XII: Tongue midline     MOTOR: RUE: 5/5 Deltoid, 5/5 Biceps, 5/5 Triceps,5/5 Grip LUE: 5/5 Deltoid, 5/5 Biceps, 5/5 Triceps, 5/5 Grip RLE: 0 out of 5 throughout LLE: 0 out of 5  throughout   SENSORY: Normal to touch all 4 extremities to light touch in bilateral upper and lower extremities, patchy altered sensation to cold in lower extremities   No ankle clonus bilaterally   Coordination: Normal finger to nose, no dysmetria  Assessment/Plan: 1. Functional deficits which require 3+ hours per day of interdisciplinary therapy in a comprehensive inpatient rehab setting. Physiatrist is providing close team supervision and 24 hour management of active medical problems listed below. Physiatrist and rehab team continue to assess barriers to discharge/monitor patient progress toward functional and medical goals  Care Tool:  Bathing    Body parts bathed by patient: Right arm, Left arm, Chest, Abdomen, Front perineal area, Face, Right upper leg, Left upper leg   Body parts bathed by helper: Right lower leg, Left lower leg, Buttocks     Bathing assist Assist Level: Minimal Assistance - Patient > 75% (sitting in roll in shower chair)     Upper Body Dressing/Undressing Upper body dressing   What is the patient wearing?: Pull over shirt  Upper body assist Assist Level: Minimal Assistance - Patient > 75%    Lower Body Dressing/Undressing Lower body dressing      What is the patient wearing?: Incontinence brief, Pants     Lower body assist Assist for lower body dressing: Total Assistance - Patient < 25%     Toileting Toileting    Toileting assist Assist for toileting: Total Assistance - Patient < 25%     Transfers Chair/bed transfer  Transfers assist  Chair/bed transfer activity did not occur: Safety/medical concerns  Chair/bed transfer assist level: Total Assistance - Patient < 25% (squat pivot and SB)     Locomotion Ambulation   Ambulation assist   Ambulation activity did not occur: Safety/medical concerns (paraplegia, fatigue)          Walk 10 feet activity   Assist  Walk 10 feet activity did not occur: Safety/medical concerns  (paraplegia, fatigue)        Walk 50 feet activity   Assist Walk 50 feet with 2 turns activity did not occur: Safety/medical concerns (paraplegia, fatigue)         Walk 150 feet activity   Assist Walk 150 feet activity did not occur: Safety/medical concerns (paraplegia, fatigue)         Walk 10 feet on uneven surface  activity   Assist Walk 10 feet on uneven surfaces activity did not occur: Safety/medical concerns (paraplegia, fatigue)         Wheelchair     Assist Is the patient using a wheelchair?: Yes Type of Wheelchair: Manual Wheelchair activity did not occur: Safety/medical concerns (paraplegia, fatigue)         Wheelchair 50 feet with 2 turns activity    Assist    Wheelchair 50 feet with 2 turns activity did not occur: Safety/medical concerns (paraplegia, fatigue)       Wheelchair 150 feet activity     Assist  Wheelchair 150 feet activity did not occur: Safety/medical concerns (paraplegia, fatigue)       Blood pressure 133/85, pulse 79, temperature 97.9 F (36.6 C), temperature source Oral, resp. rate 16, height 5' (1.524 m), weight 50.2 kg, SpO2 97%.  Medical Problem List and Plan: 1. 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  Antithrombotics: -DVT/anticoagulation:  Pharmaceutical: Eliquis 2.5mg  BID             -antiplatelet therapy: Aspirin 81 mg daily   3. Pain Management: Tylenol, robaxin, oxycodone as needed 10/24- will wait to try Tramadol for pain/discomfort til have tried  Remeron 7.5mg  for sleep  4. Mood/Behavior/Sleep: LCSW to evaluate and provide emotional support             -continue paroxetine 20 mg daily (hx of depression)             -antipsychotic agents:  n/a -12/13/22 hasn't been sleeping well, increase melatonin to 10mg  QHS, added Trazodone 25mg  PRN but advised to be cautious about using it too late.  10/21- will stop Trazodone- can impair ability to pee- will start Restoril 7.5 mg at bedtime for sleep.  10/22- cannot start Remeron due to risk of torsades de pointes- will con't Restoril  10/23- didn't sleep well again- will increase Restoril to 15 mg at bedtime 10/24- spoke to Cards- they are ok trying Remeron but wait on Tramadol-will start 15 mg at bedtime -will stop Restoril>> down to 7.5mg  -12/19/22 slept "wonderfully"! Cont regimen 5. Neuropsych/cognition: This patient is capable of making decisions on her own behalf.   6. Skin/Wound Care: Routine skin care checks   7. Fluids/Electrolytes/Nutrition: Routine Is and Os and follow-up chemistries   8: Hypertension: monitor TID and prn (hx of a.fib see meds below). Monitor with mobility. Continue metoprolol 25mg  BID  -12/13/22 BPs a little up, monitor for trend 10/21- BP's running high up to 180s- systolic- but had documented BP 87/64 this AM- will wait to titrate BP meds. 10/22- BP still in 150s- but no LOW BP's this AM- will see how does in therapy- 10/23- reduced Metoprolol per pt request to 12.5 mg BID- might need Norvasc for BP control without affecting HR  10/24- will add Norvasc 2.5 mg daily since BP elevated and not coming down  10/25- calling IM- maybe they can help- just added Norvasc yesterday -12/19/22 BPs improving a little this morning, monitor Vitals:   12/16/22 1414 12/16/22 2001 12/17/22 0341 12/17/22 0913  BP: (!) 144/55 (!) 153/57 (!) 161/55 (!) 151/50   12/17/22 1435 12/17/22 1718 12/17/22 1924 12/18/22 0712  BP: (!) 136/50 (!) 165/52 (!) 156/55 (!) 156/51   12/18/22 1434 12/18/22 1501 12/18/22 1940 12/19/22 0353  BP: (!) 161/61 (!) 170/62 (!) 159/61 133/85                 9: Hyperlipidemia: continue pravastatin 40mg  daily   10: COPD, O2 dependent 2L via [place]:              -continue Brovana nebs BID             -continue Yupelri neb daily   10/21- Has been on 2L O2 at home per pt and chart  10/25- ABG shows CO2 too low- needs to reduce O2 levels 11: CAD: stable on asa and statin   12: Atrial fibrillation, paroxysmal: maintained on Eliquis reversed pre-operatively             -continue amiodarone 100 mg daily             -continue metoprolol tartrate 25 mg BID             -cleared by [facility] to re-start Eliquis>pharmacy consult placed   10/25- reduced Metoprolol to 12.5 mg BID earlier in week - doing better 13: 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 10/22- not voiding -U/A (-)  will add Flomax 0.4 mg q supper to see if she can void- I don't think it's likely. D/w nursing  due to low amount of caths-  10/23- pt reports still getting cathed- is better 10/24- pt swears drinking 6 cups/water day, however BUN up to 27 and Cr 1.11- and cath volumes very low- not clear if having incontinence of bladder 10/25- pt said incontinent of bladder as well as cath overnight- monitor -12/19/22 U/A overnight looks infectious, will start Bactrim DS BID, UCx pending; monitor 17: ABLA: stable; follow CBC trend             -consider oral iron supplement -12/13/22 Hgb slowly downtrending from postop 8.8 on 10/14>7.6>7.5>7.3>7.0 yesterday; pt asymptomatic at this time; would recheck tomorrow  but if still at the 7.0-range then would consider transfusion to help with therapy tolerance; unclear what her prior baseline was (has hgb 12.2 back in July but then down to 9's).    10/21- Hb 7.4 today  10/23- Hb 7.2 yesterday- will check in AM 10/24- Hb down to 7.0- will d/w pt if she wants to be transfused. If so, will give 1 unit pRBCs.  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  10/22- continue to refuse bowel program-in spite of education of bowel program and need for it.  10/23- pt did last night- had BM with bowel program and one this AM, but educated can take 3-6 weeks to train gut, but should be better- had bowel incontinence for 20 years 10/24- no bowel program documented  but had small BM's at midnight and 5am- said wasn't incontinent?will check on this 10/25- asked pt to get more dig stim this evening since likely cause of bowel incontinent in AM -12/19/22 no BM since 12/17/22 documented, monitor closely 20. Poor Appetite 10/22- will add Megace for poor appetite since cannot add Remeron and too sedated to add Periactin. Will also order push fluids since not drinking great- although her BUN?Cr looking better- BUN 25 and Cr 1.08-  10/23- pt feels she's drinking 6+ cups/day- ate muffin and some eggs this AM- better than  last 2 days-  10/24- 25% of tray at most- will add Remeron 15 mg at bedtime- ok'd by Cards.  10/25- changed remeron to 7.5 mg QHS  21. Prolonged QT interval 10/24- spoke with Cards- they suggested starting Remeron 15 mg at bedtime and then rechecking EKG ina few days to see if can add Tramadol.    22. Oversedation/lethargy/suspected UTI 10/25- septis and ABG work up- when speaking with PA- appeared to have ST elevation- asked them to call IM consult-  -12/19/22 sleepy but arousable today, U/A+, started bactrim as above, awaiting UCx; CT head neg last night, CBC/BMP stable, TSH/T4 WNL, CXR stable; suspect UTI as source. Monitor.    23. L radial pulse nonpalpable but dopplerable, BPs different in that arm-- unclear when this started, had some pain in L deltoid area  yesterday but seemed to be better today. Suspect this pulse/BP discrepancy is 2/2 her vascular issue already, but reached out to vascular surgery to verify. Dr. Karin Lieu returned page, stated this is known since her surgery; nothing to do or be concerned about.   I spent >17mins performing patient care related activities, including face to face time, documentation time, reassessment, consultation of vascular, discussion of pulses/BP with patient and nursing staff, and overall coordination of care.   LOS: 7 days A FACE TO FACE EVALUATION WAS PERFORMED  [address] 12/19/2022, 11:42 AM

## 2022-12-19 NOTE — Progress Notes (Signed)
Patient drowsy today but more oriented upon awaking and easier to rouse. Alert to 4 throughout day. Ate good breakfast and lunch, drank 2 full ensure. Using pillows to offload pressure on backside and heels. Dressing change performed as ordered. Did not attempt to get patient up to chair today as still sleeping between interactions. Reports feeling better.   MD made aware of difference in blood pressure read between L arm and R arm. L arm value was 59/23 while R arm value was 152/53.  Unable to palpate pulse in L arm but hand warm. Able to read pulse with dopplers. Vascular PA evaluated. Will check BP in R arm and report off to oncoming staff.

## 2022-12-19 NOTE — Plan of Care (Signed)
  Problem: Consults Goal: RH SPINAL CORD INJURY PATIENT EDUCATION Description:  See Patient Education module for education specifics.  Outcome: Progressing   Problem: SCI BOWEL ELIMINATION Goal: RH STG MANAGE BOWEL WITH ASSISTANCE Description: STG Manage Bowel with mod Assistance. Outcome: Progressing   Problem: SCI BLADDER ELIMINATION Goal: RH STG MANAGE BLADDER WITH ASSISTANCE Description: STG Manage Bladder With mod Assistance Outcome: Progressing   Problem: RH SKIN INTEGRITY Goal: RH STG SKIN FREE OF INFECTION/BREAKDOWN Description: Incisions will continue to heal and skin be free of infection/breakdown with min assist  Outcome: Progressing Goal: RH STG MAINTAIN SKIN INTEGRITY WITH ASSISTANCE Description: STG Maintain Skin Integrity With min Assistance. Outcome: Progressing Goal: RH STG ABLE TO PERFORM INCISION/WOUND CARE W/ASSISTANCE Description: STG Able To Perform Incision/Wound Care With min Assistance. Outcome: Progressing   Problem: RH SAFETY Goal: RH STG ADHERE TO SAFETY PRECAUTIONS W/ASSISTANCE/DEVICE Description: STG Adhere to Safety Precautions With cueing Assistance/Device. Outcome: Progressing Goal: RH STG DECREASED RISK OF FALL WITH ASSISTANCE Description: STG Decreased Risk of Fall With cueing Assistance. Outcome: Progressing   Problem: RH PAIN MANAGEMENT Goal: RH STG PAIN MANAGED AT OR BELOW PT'S PAIN GOAL Description: Less than 4 with PRN medications min assist  Outcome: Progressing   Problem: RH KNOWLEDGE DEFICIT SCI Goal: RH STG INCREASE KNOWLEDGE OF SELF CARE AFTER SCI Description: Patient/caregiver will be able to manage medications, bowel and bladder care, as well as skin care from nursing education and nursing handouts independently  Outcome: Progressing   Problem: Education: Goal: Knowledge of discharge needs will improve Outcome: Progressing   Problem: Clinical Measurements: Goal: Postoperative complications will be avoided or  minimized Outcome: Progressing   Problem: Respiratory: Goal: Ability to achieve and maintain a regular respiratory rate will improve Outcome: Progressing   Problem: Skin Integrity: Goal: Demonstration of wound healing without infection will improve Outcome: Progressing

## 2022-12-19 NOTE — Care Plan (Signed)
I have discussed with rehab PA, all her symptoms were related to UTI. I do not think there is any need to see the patient, reconsult if needed. we will sign off.

## 2022-12-20 ENCOUNTER — Inpatient Hospital Stay (HOSPITAL_COMMUNITY): Payer: Medicare Other

## 2022-12-20 DIAGNOSIS — R1900 Intra-abdominal and pelvic swelling, mass and lump, unspecified site: Secondary | ICD-10-CM

## 2022-12-20 LAB — URINE CULTURE: Culture: >=100000 — AB

## 2022-12-20 LAB — BASIC METABOLIC PANEL
Anion gap: 10 (ref 5–15)
BUN: 35 mg/dL — ABNORMAL HIGH (ref 8–23)
CO2: 24 mmol/L (ref 22–32)
Calcium: 8.4 mg/dL — ABNORMAL LOW (ref 8.9–10.3)
Chloride: 103 mmol/L (ref 98–111)
Creatinine, Ser: 1.28 mg/dL — ABNORMAL HIGH (ref 0.44–1.00)
GFR, Estimated: 42 mL/min — ABNORMAL LOW (ref >60)
Glucose, Bld: 130 mg/dL — ABNORMAL HIGH (ref 70–99)
Potassium: 5.2 mmol/L — ABNORMAL HIGH (ref 3.5–5.1)
Sodium: 137 mmol/L (ref 135–145)

## 2022-12-20 MED ORDER — IOHEXOL 350 MG/ML SOLN
65.0000 mL | Freq: Once | INTRAVENOUS | Status: AC | PRN
  Administered 2022-12-20: 65 mL via INTRAVENOUS

## 2022-12-20 NOTE — Plan of Care (Signed)
  Problem: Consults Goal: RH SPINAL CORD INJURY PATIENT EDUCATION Description:  See Patient Education module for education specifics.  Outcome: Progressing   Problem: SCI BOWEL ELIMINATION Goal: RH STG MANAGE BOWEL WITH ASSISTANCE Description: STG Manage Bowel with mod Assistance. Outcome: Progressing   Problem: SCI BLADDER ELIMINATION Goal: RH STG MANAGE BLADDER WITH ASSISTANCE Description: STG Manage Bladder With mod Assistance Outcome: Progressing

## 2022-12-20 NOTE — Progress Notes (Signed)
IP Rehab Bowel Program Documentation   Bowel Program Start time [phone]  Dig Stim Indicated? Yes  Dig Stim Prior to Suppository or mini Enema X 1   Output from dig stim: none  Ordered intervention: Suppository Yes , mini enema No ,   Repeat dig stim after Suppository or Mini enema  X 1,  Output? Minimal   Bowel Program Complete? Yes , handoff given none  Patient Tolerated? Yes

## 2022-12-20 NOTE — Progress Notes (Signed)
PROGRESS NOTE   Subjective/Complaints:  Pt doing well, slept well last night. Denies any pain. Reports LBM was this morning, but not documented/reported to morning nurse today. States she's urinating fine but appears she's getting cath'd and unclear if she's urinating in between caths. Denies any other complaints or concerns today.  When asked if she's ever noticed her pulse in her abdomen, she says she hasn't ever paid attention. When asked if her abdomen hurts, she points to the area that's pulsing and states "it's a little sore right here".   ROS:    Pt denies SOB, CP, N/V/C/D, and vision changes    Except for HPI  Objective:   CT HEAD WO CONTRAST ( )  Result Date: 12/19/2022 CLINICAL DATA:  82 year old female altered mental status. EXAM: CT HEAD WITHOUT CONTRAST TECHNIQUE: Contiguous axial images were obtained from the base of the skull through the vertex without intravenous contrast. RADIATION DOSE REDUCTION: This exam was performed according to the departmental dose-optimization program which includes automated exposure control, adjustment of the mA and/or kV according to patient size and/or use of iterative reconstruction technique. COMPARISON:  Brain MRI 02/13/2021.  Head CT 09/14/2022. FINDINGS: Brain: Cerebral volume is within normal limits for age. No midline shift, ventriculomegaly, mass effect, evidence of mass lesion, intracranial hemorrhage or evidence of cortically based acute infarction. Gray-white differentiation appears stable to findings on the 2022 MRI, with patchy white matter hypodensity most pronounced in the posterior left corona radiata and tracking to the left lentiform. Vascular: No suspicious intracranial vascular hyperdensity. Calcified atherosclerosis at the skull base. Skull: Broad-based chronic mixed lucent and stippled bone lesion of the Left parietal calvarium (series 3, image 61), about 33 mm in  length and stable since 2022, with intrinsic increased T1 signal consistent with benign etiology, hemangioma. No acute or suspicious osseous lesion. Advanced right TMJ degeneration. Sinuses/Orbits: Visualized paranasal sinuses and mastoids are stable and well aerated. Other: No acute orbit or scalp soft tissue finding. IMPRESSION: 1. No acute intracranial abnormality. 2. 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 3. Incidental left parietal bone benign hemangioma. Electronically Signed   By: Odessa Fleming M.D.   On: 12/19/2022 03:55   DG Chest 2 View  Result Date: 12/18/2022 CLINICAL DATA:  Mental status alteration EXAM: CHEST - 2 VIEW COMPARISON:  12/06/2022 x-ray FINDINGS: Interval placement of the thoracic endograft. The aorta continues to have a tortuous ectatic course. Stable cardiopericardial silhouette. Vascular congestion now seen. No pneumothorax or consolidation. Small effusions posteriorly identified on the lateral view. No frank edema. Curvature and degenerative changes of the spine. IMPRESSION: Interval placement of aortic endograft.  Stable cardiac silhouette. Vascular congestion.  Small effusions. Electronically Signed   By: Karen Kays M.D.   On: 12/18/2022 17:28   Recent Labs    12/18/22 0653  WBC 9.8  HGB 8.1*  HCT 25.8*  PLT 350   Recent Labs    12/18/22 0653  NA 135  K 3.9  CL 100  CO2 26  GLUCOSE 92  BUN 22  CREATININE 0.90  CALCIUM 8.4*    Urinalysis    Component Value Date/Time   COLORURINE  YELLOW 12/18/2022 1854   APPEARANCEUR HAZY (A) 12/18/2022 1854   LABSPEC 1.008 12/18/2022 1854   PHURINE 7.0 12/18/2022 1854   GLUCOSEU NEGATIVE 12/18/2022 1854   HGBUR SMALL (A) 12/18/2022 1854   BILIRUBINUR NEGATIVE 12/18/2022 1854   KETONESUR NEGATIVE 12/18/2022 1854   PROTEINUR NEGATIVE 12/18/2022 1854   NITRITE NEGATIVE 12/18/2022 1854   LEUKOCYTESUR MODERATE (A) 12/18/2022 1854      Intake/Output Summary (Last 24 hours) at  12/20/2022 1110 Last data filed at 12/20/2022 0710 Gross per 24 hour  Intake 417 ml  Output 1500 ml  Net -1083 ml     Pressure Injury 12/12/22 Sacrum Medial Deep Tissue Pressure Injury - Purple or maroon localized area of discolored intact skin or blood-filled blister due to damage of underlying soft tissue from pressure and/or shear. (Active)  12/12/22 1844  Location: Sacrum  Location Orientation: Medial  Staging: Deep Tissue Pressure Injury - Purple or maroon localized area of discolored intact skin or blood-filled blister due to damage of underlying soft tissue from pressure and/or shear.  Wound Description (Comments):   Present on Admission: Yes     Physical Exam: Vital Signs Blood pressure (!) 148/50, pulse 76, temperature (!) 97.5 F (36.4 C), temperature source Oral, resp. rate 18, height 5' (1.524 m), weight 50.2 kg, SpO2 100%.  General: asleep but easily awoken and much more awake than before, appropriate; NAD HENT: conjugate gaze; oropharynx dry- O2 by [place] 2L; had a nose bleed earlier but stopped quickly CV: regular rate and in afib; no JVD Pulmonary: CTA B/L; no W/R/R-decreased at bases like normal GI: soft, ND, (+)BS- a little hypoactive, not really tender but states "a little sore", large pulsatile area noted which wasn't previously palpated in prior exams. Psychiatric: appropriate- a little quiet and sleepy but easily arousable and remains awake during exam Neurological: Ox3   PRIOR EXAMS: Skin: Groin incision site looks okay, bruising noted bilateral shins 2 IVs in place left upper extremity looks okay Extremities: unable to palpate L radial pulse but dopplers confirm pulse with rate in 60-70s, arm well perfused appearing, warm and pink, motor function preserved. Brachial pulse also unable to be felt. R radial pulse strong and palpable. Neuro:   Mental Status: AAOx3, memory intact,  Speech/Languate:  fluent, follows simple commands CRANIAL NERVES: II: PERRL.  Visual fields full III, IV, VI: EOM intact, no gaze preference or deviation V: normal sensation bilaterally VII: no asymmetry VIII: normal hearing to speech IX, X: normal palatal elevation XI: 5/5 head turn and 5/5 shoulder shrug bilaterally XII: Tongue midline     MOTOR: RUE: 5/5 Deltoid, 5/5 Biceps, 5/5 Triceps,5/5 Grip LUE: 5/5 Deltoid, 5/5 Biceps, 5/5 Triceps, 5/5 Grip RLE: 0 out of 5 throughout LLE: 0 out of 5 throughout   SENSORY: Normal to touch all 4 extremities to light touch in bilateral upper and lower extremities, patchy altered sensation to cold in lower extremities   No ankle clonus bilaterally   Coordination: Normal finger to nose, no dysmetria  Assessment/Plan: 1. Functional deficits which require 3+ hours per day of interdisciplinary therapy in a comprehensive inpatient rehab setting. Physiatrist is providing close team supervision and 24 hour management of active medical problems listed below. Physiatrist and rehab team continue to assess barriers to discharge/monitor patient progress toward functional and medical goals  Care Tool:  Bathing    Body parts bathed by patient: Right arm, Left arm, Chest, Abdomen, Front perineal area, Face, Right upper leg, Left upper leg   Body parts  bathed by helper: Right lower leg, Left lower leg, Buttocks     Bathing assist Assist Level: Minimal Assistance - Patient > 75% (sitting in roll in shower chair)     Upper Body Dressing/Undressing Upper body dressing   What is the patient wearing?: Pull over shirt    Upper body assist Assist Level: Minimal Assistance - Patient > 75%    Lower Body Dressing/Undressing Lower body dressing      What is the patient wearing?: Incontinence brief, Pants     Lower body assist Assist for lower body dressing: Total Assistance - Patient < 25%     Toileting Toileting    Toileting assist Assist for toileting: Total Assistance - Patient < 25%     Transfers Chair/bed  transfer  Transfers assist  Chair/bed transfer activity did not occur: Safety/medical concerns  Chair/bed transfer assist level: Total Assistance - Patient < 25% (squat pivot and SB)     Locomotion Ambulation   Ambulation assist   Ambulation activity did not occur: Safety/medical concerns (paraplegia, fatigue)          Walk 10 feet activity   Assist  Walk 10 feet activity did not occur: Safety/medical concerns (paraplegia, fatigue)        Walk 50 feet activity   Assist Walk 50 feet with 2 turns activity did not occur: Safety/medical concerns (paraplegia, fatigue)         Walk 150 feet activity   Assist Walk 150 feet activity did not occur: Safety/medical concerns (paraplegia, fatigue)         Walk 10 feet on uneven surface  activity   Assist Walk 10 feet on uneven surfaces activity did not occur: Safety/medical concerns (paraplegia, fatigue)         Wheelchair     Assist Is the patient using a wheelchair?: Yes Type of Wheelchair: Manual Wheelchair activity did not occur: Safety/medical concerns (paraplegia, fatigue)         Wheelchair 50 feet with 2 turns activity    Assist    Wheelchair 50 feet with 2 turns activity did not occur: Safety/medical concerns (paraplegia, fatigue)       Wheelchair 150 feet activity     Assist  Wheelchair 150 feet activity did not occur: Safety/medical concerns (paraplegia, fatigue)       Blood pressure (!) 148/50, pulse 76, temperature (!) 97.5 F (36.4 C), temperature source Oral, resp. rate 18, height 5' (1.524 m), weight 50.2 kg, SpO2 100%.  Medical Problem List and Plan: 1. 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   Antithrombotics: -DVT/anticoagulation:  Pharmaceutical: Eliquis 2.5mg  BID             -antiplatelet therapy: Aspirin 81 mg daily   3. Pain Management: Tylenol, robaxin, oxycodone as needed 10/24- will wait to try Tramadol for pain/discomfort til have tried  Remeron 7.5mg  for sleep  4. Mood/Behavior/Sleep: LCSW to evaluate and provide emotional support             -continue paroxetine 20 mg daily (hx of depression)             -  antipsychotic agents: n/a -12/13/22 hasn't been sleeping well, increase melatonin to 10mg  QHS, added Trazodone 25mg  PRN but advised to be cautious about using it too late.  10/21- will stop Trazodone- can impair ability to pee- will start Restoril 7.5 mg at bedtime for sleep.  10/22- cannot start Remeron due to risk of torsades de pointes- will con't Restoril  10/23- didn't sleep well again- will increase Restoril to 15 mg at bedtime 10/24- spoke to Cards- they are ok trying Remeron but wait on Tramadol-will start 15 mg at bedtime -will stop Restoril>> down to 7.5mg  -12/19/22 slept "wonderfully"! Cont regimen 5. Neuropsych/cognition: This patient is capable of making decisions on her own behalf.   6. Skin/Wound Care: Routine skin care checks   7. Fluids/Electrolytes/Nutrition: Routine Is and Os and follow-up chemistries   8: Hypertension: monitor TID and prn (hx of a.fib see meds below). Monitor with mobility. Continue metoprolol 25mg  BID  -12/13/22 BPs a little up, monitor for trend 10/21- BP's running high up to 180s- systolic- but had documented BP 87/64 this AM- will wait to titrate BP meds. 10/22- BP still in 150s- but no LOW BP's this AM- will see how does in therapy- 10/23- reduced Metoprolol per pt request to 12.5 mg BID- might need Norvasc for BP control without affecting HR  10/24- will add Norvasc 2.5 mg daily since BP elevated and not coming down  10/25- calling IM- maybe they can help- just added Norvasc yesterday -12/19/22 BPs improving a little this  morning, monitor -12/20/22 BPs still a tad elevated but stable, monitor a couple more days before deciding on changes for now Vitals:   12/17/22 1435 12/17/22 1718 12/17/22 1924 12/18/22 0712  BP: (!) 136/50 (!) 165/52 (!) 156/55 (!) 156/51   12/18/22 1434 12/18/22 1501 12/18/22 1940 12/19/22 0353  BP: (!) 161/61 (!) 170/62 (!) 159/61 133/85   12/19/22 1239 12/19/22 1450 12/19/22 1928 12/20/22 0514  BP: 124/62 (!) 148/63 (!) 145/56 (!) 148/50                 9: Hyperlipidemia: continue pravastatin 40mg  daily   10: COPD, O2 dependent 2L via [place]:             -continue Brovana nebs BID             -continue Yupelri neb daily   10/21- Has been on 2L O2 at home per pt and chart  10/25- ABG shows CO2 too low- needs to reduce O2 levels 11: CAD: stable on asa and statin   12: Atrial fibrillation, paroxysmal: maintained on Eliquis reversed pre-operatively             -continue amiodarone 100 mg daily             -continue metoprolol tartrate 25 mg BID             -cleared by [facility] to re-start Eliquis>pharmacy consult placed   10/25- reduced Metoprolol to 12.5 mg BID earlier in week - doing better 13: 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 10/22- not voiding -U/A (-)  will add Flomax 0.4 mg q supper to see if she can void- I don't think it's likely. D/w nursing due to low amount of caths-  10/23- pt reports  still getting cathed- is better 10/24- pt swears drinking 6 cups/water day, however BUN up to 27 and Cr 1.11- and cath volumes very low- not clear if having incontinence of bladder 10/25- pt said incontinent of bladder as well as cath overnight- monitor -12/19/22 U/A overnight looks infectious, will start Bactrim DS BID x5d, UCx pending; monitor -12/20/22 UCx with >100k CFU of K. Pneumoniae, sensitive to bactrim, cont abx course 17: ABLA: stable; follow CBC trend             -consider oral iron supplement -12/13/22 Hgb slowly downtrending from postop 8.8 on 10/14>7.6>7.5>7.3>7.0 yesterday; pt asymptomatic at this time; would recheck tomorrow but if still at the 7.0-range then would consider transfusion to help with therapy tolerance; unclear what her prior baseline was (has hgb 12.2 back in July but then down to 9's).    10/21- Hb 7.4 today  10/23- Hb 7.2 yesterday- will check in AM 10/24- Hb down to 7.0- will d/w pt if she wants to be transfused. If so, will give 1 unit pRBCs.  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  10/22- continue to refuse bowel program-in spite of education of bowel program and need for it.  10/23- pt did last night- had BM with bowel program and one this AM, but educated can take 3-6 weeks to train gut,  but should be better- had bowel incontinence for 20 years 10/24- no bowel program documented  but had small BM's at midnight and 5am- said wasn't incontinent?will check on this 10/25- asked pt to get more dig stim this evening since likely cause of bowel incontinent in AM -12/20/22 reports BM this morning but no BM since 12/17/22 documented, no report to nursing staff given; monitor closely but if no BM by tomorrow may need to consider additional adjustments 20. Poor Appetite 10/22- will add Megace for poor appetite since cannot add Remeron and too sedated to add Periactin. Will also order push fluids since not drinking great- although her BUN?Cr looking better- BUN 25 and Cr 1.08-  10/23- pt feels she's drinking 6+ cups/day- ate muffin and some eggs this AM- better than last 2 days-  10/24- 25% of tray at most- will add Remeron 15 mg at bedtime- ok'd by Cards.  10/25- changed remeron to 7.5 mg QHS  21. Prolonged QT interval 10/24- spoke with Cards-  they suggested starting Remeron 15 mg at bedtime and then rechecking EKG ina few days to see if can add Tramadol.    22. Oversedation/lethargy/suspected UTI 10/25- septis and ABG work up- when speaking with PA- appeared to have ST elevation- asked them to call IM consult-  -12/19/22 sleepy but arousable today, U/A+, started bactrim as above, awaiting UCx; CT head neg last night, CBC/BMP stable, TSH/T4 WNL, CXR stable; suspect UTI as source. Monitor. See above #16   23. L radial pulse nonpalpable but dopplerable, BPs different in that arm-- unclear when this started, had some pain in L deltoid area yesterday but seemed to be better today. Suspect this pulse/BP discrepancy is 2/2 her vascular issue already, but reached out to vascular surgery to verify. Dr. Karin Lieu returned page, stated this is known since her surgery; nothing to do or be concerned about.   24. Pulsatile abdominal aorta: not noticed previously, not mentioned in notes, but today 12/20/22  I noticed it was very prominently pulsatile and pt reported it being sore in that area; spoke with Dr. Karin Lieu again of [facility], recommended getting CTA Abd/pelv to evaluate for AAA. Will await results.   I spent >31mins performing patient care related activities, including face to face time, documentation time, consultation of vascular, discussion of pulsatile aorta and BMs with patient and nursing staff, and overall coordination of care.   LOS: 8 days A FACE TO FACE EVALUATION WAS PERFORMED  [address] 12/20/2022, 11:10 AM

## 2022-12-21 DIAGNOSIS — F331 Major depressive disorder, recurrent, moderate: Secondary | ICD-10-CM

## 2022-12-21 HISTORY — DX: Major depressive disorder, recurrent, moderate: F33.1

## 2022-12-21 LAB — BASIC METABOLIC PANEL
Anion gap: 10 (ref 5–15)
BUN: 34 mg/dL — ABNORMAL HIGH (ref 8–23)
CO2: 21 mmol/L — ABNORMAL LOW (ref 22–32)
Calcium: 8.2 mg/dL — ABNORMAL LOW (ref 8.9–10.3)
Chloride: 104 mmol/L (ref 98–111)
Creatinine, Ser: 1.41 mg/dL — ABNORMAL HIGH (ref 0.44–1.00)
GFR, Estimated: 37 mL/min — ABNORMAL LOW (ref >60)
Glucose, Bld: 139 mg/dL — ABNORMAL HIGH (ref 70–99)
Potassium: 4.4 mmol/L (ref 3.5–5.1)
Sodium: 135 mmol/L (ref 135–145)

## 2022-12-21 LAB — CBC WITH DIFFERENTIAL/PLATELET
Abs Immature Granulocytes: 0.04 10*3/uL (ref 0.00–0.07)
Basophils Absolute: 0.1 10*3/uL (ref 0.0–0.1)
Basophils Relative: 1 %
Eosinophils Absolute: 0.3 10*3/uL (ref 0.0–0.5)
Eosinophils Relative: 4 %
HCT: 22.2 % — ABNORMAL LOW (ref 36.0–46.0)
Hemoglobin: 6.7 g/dL — CL (ref 12.0–15.0)
Immature Granulocytes: 1 %
Lymphocytes Relative: 13 %
Lymphs Abs: 1 10*3/uL (ref 0.7–4.0)
MCH: 26.2 pg (ref 26.0–34.0)
MCHC: 30.2 g/dL (ref 30.0–36.0)
MCV: 86.7 fL (ref 80.0–100.0)
Monocytes Absolute: 0.7 10*3/uL (ref 0.1–1.0)
Monocytes Relative: 10 %
Neutro Abs: 5.3 10*3/uL (ref 1.7–7.7)
Neutrophils Relative %: 71 %
Platelets: 365 10*3/uL (ref 150–400)
RBC: 2.56 MIL/uL — ABNORMAL LOW (ref 3.87–5.11)
RDW: 17.3 % — ABNORMAL HIGH (ref 11.5–15.5)
WBC: 7.3 10*3/uL (ref 4.0–10.5)
nRBC: 0 % (ref 0.0–0.2)

## 2022-12-21 LAB — PREPARE RBC (CROSSMATCH)

## 2022-12-21 MED ORDER — ORAL CARE MOUTH RINSE: 15.0000 mL | OROMUCOSAL | Status: DC | PRN

## 2022-12-21 MED ORDER — SODIUM CHLORIDE 0.9% IV SOLUTION: Freq: Once | INTRAVENOUS | Status: AC

## 2022-12-21 NOTE — Progress Notes (Addendum)
Occupational Therapy Weekly Progress Note  Patient Details  Name: Heather Molina MRN: [id number] Date of Birth: February 12, 1941  Beginning of progress report period: December 13, 2022 End of progress report period: December 21, 2022  Today's Date: 12/21/2022 OT Individual Time: 0910-1000 & 1400-1415 OT Individual Time Calculation (min): 50 min  and Today's Date: 12/21/2022 OT Missed Time: 10 Minutes Missed Time Reason: Nursing care (IV line care)   Patient has met 3 of 3 short term goals.  Pt has displayed increased independence with ADLs such as LB dressing at the bed level with utilizing bed features, UB dressing at W/C level, sitting balance, transfers and bed mobility.  Patient continues to demonstrate the following deficits: muscle weakness and muscle paralysis, decreased cardiorespiratoy endurance, unbalanced muscle activation and decreased motor planning, and decreased sitting balance, decreased standing balance, decreased postural control, and decreased balance strategies and therefore will continue to benefit from skilled OT intervention to enhance overall performance with BADL and Reduce care partner burden.  Patient progressing toward long term goals..  Continue plan of care.  OT Short Term Goals Week 1:  OT Short Term Goal 1 (Week 1): Pt will complete sitting EOB with Mod A to complete ADL task OT Short Term Goal 1 - Progress (Week 1): Met OT Short Term Goal 2 (Week 1): Pt will complete LB dressing with Max A OT Short Term Goal 2 - Progress (Week 1): Met OT Short Term Goal 3 (Week 1): Pt will complete UB dressing with Mod A OT Short Term Goal 3 - Progress (Week 1): Met Week 2:  OT Short Term Goal 1 (Week 2): Pt will complete commode transfer Max A with transfer board OT Short Term Goal 2 (Week 2): Pt will complete pressure relief without cues 25% of time throughout sessions  Skilled Therapeutic Interventions/Progress Updates:      Therapy Documentation Precautions:   Precautions Precautions: Fall Precaution Comments: paraplegia Restrictions Weight Bearing Restrictions: No Session 1 General: "Hi honey I was looking forward to you coming!" Pt supine in bed upon OT arrival, agreeable to OT session. Missed 10 minutes this session d/t nursing care.  Pain:  no pain reported   ADL: Bed mobility: Min A for steadying at end of rolling in order to assist with pants management, utilizing bed rails when rolling, Max A supine>EOB Grooming: SBA seated in W/C at sink, OT assisted pt with organizing sink in order for pt to reach frequently used items for increased independence Oral hygiene: SBA seated in W/C at sink,  UB dressing: SBA seated in W/C for donning/doffing overhead shirt LB dressing: Mod A bed level, OT assisted with bringing legs into figure 4, pt able to thread pants onto feet, pt able to manage over waist Footwear: total A for donning TEDs and shoes Transfers: +2 air bed>TIS for safety   Other Treatments: OT reiterated education for pressure relief strategies when in W/C for prolonged periods. OT educated pt on correct timing of positioning strategies and ways to remember to complete pressure relief.    Pt seated in W/C at end of session with W/C alarm donned, call light within reach and 4Ps assessed.   Session 2 General: "I would love that." Pt supine in bed upon OT arrival, agreeable to OT session. Receiving blood transfusion   Pain: no pain reported  Other Treatments: OT applied KT tape to Lt shoulder in order to decrease pain and provide stabilization for joint during ADLs. OT educated pt on benefits of KT tape  for pain and positioning.    Pt supine in bed with bed alarm activated, 2 bed rails up, call light within reach and 4Ps assessed.   Therapy/Group: Individual Therapy  [name], OTD, OTR/L 12/21/2022, 3:44 PM

## 2022-12-21 NOTE — Plan of Care (Signed)
  Problem: Consults Goal: RH SPINAL CORD INJURY PATIENT EDUCATION Description:  See Patient Education module for education specifics.  Outcome: Progressing   Problem: SCI BOWEL ELIMINATION Goal: RH STG MANAGE BOWEL WITH ASSISTANCE Description: STG Manage Bowel with mod Assistance. Outcome: Progressing   Problem: SCI BLADDER ELIMINATION Goal: RH STG MANAGE BLADDER WITH ASSISTANCE Description: STG Manage Bladder With mod Assistance Outcome: Progressing   Problem: RH SKIN INTEGRITY Goal: RH STG SKIN FREE OF INFECTION/BREAKDOWN Description: Incisions will continue to heal and skin be free of infection/breakdown with min assist  Outcome: Progressing Goal: RH STG MAINTAIN SKIN INTEGRITY WITH ASSISTANCE Description: STG Maintain Skin Integrity With min Assistance. Outcome: Progressing Goal: RH STG ABLE TO PERFORM INCISION/WOUND CARE W/ASSISTANCE Description: STG Able To Perform Incision/Wound Care With min Assistance. Outcome: Progressing   Problem: RH SAFETY Goal: RH STG ADHERE TO SAFETY PRECAUTIONS W/ASSISTANCE/DEVICE Description: STG Adhere to Safety Precautions With cueing Assistance/Device. Outcome: Progressing Goal: RH STG DECREASED RISK OF FALL WITH ASSISTANCE Description: STG Decreased Risk of Fall With cueing Assistance. Outcome: Progressing   Problem: RH PAIN MANAGEMENT Goal: RH STG PAIN MANAGED AT OR BELOW PT'S PAIN GOAL Description: Less than 4 with PRN medications min assist  Outcome: Progressing   Problem: RH KNOWLEDGE DEFICIT SCI Goal: RH STG INCREASE KNOWLEDGE OF SELF CARE AFTER SCI Description: Patient/caregiver will be able to manage medications, bowel and bladder care, as well as skin care from nursing education and nursing handouts independently  Outcome: Progressing   Problem: Education: Goal: Knowledge of discharge needs will improve Outcome: Progressing   Problem: Clinical Measurements: Goal: Postoperative complications will be avoided or  minimized Outcome: Progressing   Problem: Respiratory: Goal: Ability to achieve and maintain a regular respiratory rate will improve Outcome: Progressing   Problem: Skin Integrity: Goal: Demonstration of wound healing without infection will improve Outcome: Progressing

## 2022-12-21 NOTE — Progress Notes (Signed)
Physical Therapy Session Note  Patient Details  Name: Heather Molina MRN: [id number] Date of Birth: 09-03-1940  Today's Date: 12/21/2022 PT Individual Time: 1105-1200 PT Individual Time Calculation (min): 55 min   Short Term Goals: Week 1:  PT Short Term Goal 1 (Week 1): Pt will require mod A with rolling PT Short Term Goal 2 (Week 1): Pt will require mod A with supine <>sit PT Short Term Goal 3 (Week 1): Pt will require mod A for static sitting balance x 1 minute PT Short Term Goal 4 (Week 1): Pt will require mod A for slide board transfer from bed<>chair  Skilled Therapeutic Interventions/Progress Updates:      Therapy Documentation Precautions:  Precautions Precautions: Fall Precaution Comments: paraplegia Restrictions Weight Bearing Restrictions: No  Pt agreeable to PT session and without reports of pain. PT provided pt with power wheelchair as pt with decreased bilateral shoulder mobility, strength and UE pain with manual propulsion. Pt mod A with supine to sit and max A with slide board transfer to [facility]. Pt educated on PWC features and pt able to perform pressure relief independently. Pt min A with w/c propulsion on slowest speed and navigate turning and backward driving. Pt left seated up in w/c at bedside with all needs in reach.     Therapy/Group: Individual Therapy  Truitt Leep Truitt Leep PT, DPT  12/21/2022, 12:29 PM

## 2022-12-21 NOTE — Progress Notes (Signed)
PROGRESS NOTE   Subjective/Complaints:  Pt reports dozed off last night, but didn't sleep much- but doesn't want to sleep like did with remeron.   Was knocked with with reneron.  Breathing abotu the same.  Poor energy.  LBM last night with bowel program.   ROS:    Pt denies SOB, abd pain, CP, N/V/C/D, and vision changes    Except for HPI  Objective:   CT Angio Abd/Pel w/ and/or w/o  Result Date: 12/20/2022 CLINICAL DATA:  Pulsatile abdominal mass EXAM: CTA ABDOMEN AND PELVIS WITHOUT AND WITH CONTRAST TECHNIQUE: Multidetector CT imaging of the abdomen and pelvis was performed using the standard protocol during bolus administration of intravenous contrast. Multiplanar reconstructed images and MIPs were obtained and reviewed to evaluate the vascular anatomy. RADIATION DOSE REDUCTION: This exam was performed according to the departmental dose-optimization program which includes automated exposure control, adjustment of the mA and/or kV according to patient size and/or use of iterative reconstruction technique. CONTRAST:  65mL OMNIPAQUE IOHEXOL 350 MG/ML SOLN COMPARISON:  CT chest angiogram, 12/06/2022, CT abdomen pelvis, 03/04/2018 FINDINGS: VASCULAR Partially imaged descending thoracic aortic stent endograft. Severe mixed calcific atherosclerosis. 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 Aneurysm is new compared to prior examination dated 2020. Standard branching pattern of the abdominal aorta with solitary bilateral renal arteries. Branch vessels are patent. Review of the MIP images confirms the above findings. NON-VASCULAR Lower Chest: Small bilateral pleural effusions and associated atelectasis or consolidation. Large hiatal hernia, incompletely imaged. Hepatobiliary: No focal liver abnormality is seen. Status post cholecystectomy. No biliary dilatation. Pancreas:  Unremarkable. No pancreatic ductal dilatation or surrounding inflammatory changes. Spleen: Normal in size without significant abnormality. Adrenals/Urinary Tract: Non nodular adenomatous thickening of the left adrenal gland, benign, requiring no further follow-up or characterization. Kidneys are normal, without renal calculi, solid lesion, or hydronephrosis. Bladder is unremarkable. Stomach/Bowel: Stomach is within normal limits. Appendix appears normal. Circumferential wall thickening of the low rectum with adjacent perirectal and presacral fat stranding and fluid (series 6, image 127, 134). Lymphatic: No enlarged abdominal or pelvic lymph nodes. Reproductive: No mass or other significant abnormality. Other: No abdominal wall hernia or abnormality. No ascites. Musculoskeletal: No acute osseous findings. IMPRESSION: 1. 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 Aneurysm is new compared to prior examination dated 2020. Recommend follow-up ultrasound every 3 years if not otherwise imaged and if clinically appropriate. (Ref.: J Vasc Surg. 2018; 67:2-77 and J Am Coll Radiol 2013;10(10):789-794.) 2. Circumferential wall thickening of the low rectum with adjacent perirectal and presacral fat stranding and fluid, consistent with nonspecific infectious or inflammatory proctitis. 3. Small bilateral pleural effusions and associated atelectasis or consolidation. 4. Large hiatal hernia, incompletely imaged. Aortic Atherosclerosis (ICD10-I70.0). Electronically Signed   By: Jearld Lesch M.D.   On: 12/20/2022 15:47   Recent Labs    12/21/22 0754  WBC 7.3  HGB 6.7*  HCT 22.2*  PLT 365   Recent Labs    12/20/22 1239 12/21/22 0754  NA 137 135  K 5.2* 4.4  CL 103 104  CO2 24 21*  GLUCOSE 130* 139*  BUN 35* 34*  CREATININE 1.28* 1.41*  CALCIUM 8.4* 8.2*    Urinalysis    Component Value Date/Time   COLORURINE YELLOW 12/18/2022 1854    APPEARANCEUR HAZY (A) 12/18/2022 1854   LABSPEC 1.008 12/18/2022 1854   PHURINE 7.0 12/18/2022 1854   GLUCOSEU NEGATIVE 12/18/2022 1854   HGBUR SMALL (A) 12/18/2022 1854   BILIRUBINUR NEGATIVE 12/18/2022 1854   KETONESUR NEGATIVE 12/18/2022 1854   PROTEINUR NEGATIVE 12/18/2022 1854   NITRITE NEGATIVE 12/18/2022 1854   LEUKOCYTESUR MODERATE (A) 12/18/2022 1854      Intake/Output Summary (Last 24 hours) at 12/21/2022 0901 Last data filed at 12/21/2022 0729 Gross per 24 hour  Intake 656 ml  Output 1700 ml  Net -1044 ml     Pressure Injury 12/12/22 Sacrum Medial Deep Tissue Pressure Injury - Purple or maroon localized area of discolored intact skin or blood-filled blister due to damage of underlying soft tissue from pressure and/or shear. (Active)  12/12/22 1844  Location: Sacrum  Location Orientation: Medial  Staging: Deep Tissue Pressure Injury - Purple or maroon localized area of discolored intact skin or blood-filled blister due to damage of underlying soft tissue from pressure and/or shear.  Wound Description (Comments):   Present on Admission: Yes     Pressure Injury 12/20/22 Heel Left;Lateral;Posterior Stage 2 -  Partial thickness loss of dermis presenting as a shallow open injury with a red, pink wound bed without slough. (Active)  12/20/22 2000  Location: Heel  Location Orientation: Left;Lateral;Posterior  Staging: Stage 2 -  Partial thickness loss of dermis presenting as a shallow open injury with a red, pink wound bed without slough.  Wound Description (Comments):   Present on Admission: Yes     Pressure Injury 12/20/22 Heel Right;Lateral Stage 2 -  Partial thickness loss of dermis presenting as a shallow open injury with a red, pink wound bed without slough. (Active)  12/20/22 2000  Location: Heel  Location Orientation: Right;Lateral  Staging: Stage 2 -  Partial thickness loss of dermis presenting as a shallow open injury with a red, pink wound bed without slough.   Wound Description (Comments):   Present on Admission: Yes     Pressure Injury 12/21/22 Ischial tuberosity Left;Medial Stage 2 -  Partial thickness loss of dermis presenting as a shallow open injury with a red, pink wound bed without slough. (Active)  12/21/22 0600  Location: Ischial tuberosity  Location Orientation: Left;Medial  Staging: Stage 2 -  Partial thickness loss of dermis presenting as a shallow open injury with a red, pink wound bed without slough.  Wound Description (Comments):   Present on Admission: Yes     Physical Exam: Vital Signs Blood pressure (!) 140/54, pulse 78, temperature 98.4 F (36.9 C), temperature source Oral, resp. rate 19, height 5' (1.524 m), weight 50.2 kg, SpO2 99%.    General: awake, alert, appropriate, sitting up in bed; frail; NAD HENT: conjugate gaze; oropharynx dry- O2 2L by [place] CV: regular rate; no JVD Pulmonary: CTA B/L; no W/R/R-decreased at bases GI: soft, NT, ND, (+)BS- normoactive Psychiatric: appropriate Neurological: Ox3    PRIOR EXAMS: Skin: Groin incision site looks okay, bruising noted bilateral shins 2 IVs in place left upper extremity looks okay Extremities: unable to palpate L radial pulse but dopplers confirm pulse with rate in 60-70s, arm well perfused appearing, warm and pink, motor function preserved. Brachial pulse also unable to be felt. R radial pulse strong and palpable. Neuro:  Mental Status: AAOx3, memory intact,  Speech/Languate:  fluent, follows simple commands CRANIAL NERVES: II: PERRL. Visual fields full III, IV, VI: EOM intact, no gaze preference or deviation V: normal sensation bilaterally VII: no asymmetry VIII: normal hearing to speech IX, X: normal palatal elevation XI: 5/5 head turn and 5/5 shoulder shrug bilaterally XII: Tongue midline     MOTOR: RUE: 5/5 Deltoid, 5/5 Biceps, 5/5 Triceps,5/5 Grip LUE: 5/5 Deltoid, 5/5 Biceps, 5/5 Triceps, 5/5 Grip RLE: 0 out of 5 throughout LLE: 0 out of 5  throughout   SENSORY: Normal to touch all 4 extremities to light touch in bilateral upper and lower extremities, patchy altered sensation to cold in lower extremities   No ankle clonus bilaterally   Coordination: Normal finger to nose, no dysmetria  Assessment/Plan: 1. Functional deficits which require 3+ hours per day of interdisciplinary therapy in a comprehensive inpatient rehab setting. Physiatrist is providing close team supervision and 24 hour management of active medical problems listed below. Physiatrist and rehab team continue to assess barriers to discharge/monitor patient progress toward functional and medical goals  Care Tool:  Bathing    Body parts bathed by patient: Right arm, Left arm, Chest, Abdomen, Front perineal area, Face, Right upper leg, Left upper leg   Body parts bathed by helper: Right lower leg, Left lower leg, Buttocks     Bathing assist Assist Level: Minimal Assistance - Patient > 75% (sitting in roll in shower chair)     Upper Body Dressing/Undressing Upper body dressing   What is the patient wearing?: Pull over shirt    Upper body assist Assist Level: Minimal Assistance - Patient > 75%    Lower Body Dressing/Undressing Lower body dressing      What is the patient wearing?: Incontinence brief, Pants     Lower body assist Assist for lower body dressing: Total Assistance - Patient < 25%     Toileting Toileting    Toileting assist Assist for toileting: Total Assistance - Patient < 25%     Transfers Chair/bed transfer  Transfers assist  Chair/bed transfer activity did not occur: Safety/medical concerns  Chair/bed transfer assist level: Total Assistance - Patient < 25% (squat pivot and SB)     Locomotion Ambulation   Ambulation assist   Ambulation activity did not occur: Safety/medical concerns (paraplegia, fatigue)          Walk 10 feet activity   Assist  Walk 10 feet activity did not occur: Safety/medical concerns  (paraplegia, fatigue)        Walk 50 feet activity   Assist Walk 50 feet with 2 turns activity did not occur: Safety/medical concerns (paraplegia, fatigue)         Walk 150 feet activity   Assist Walk 150 feet activity did not occur: Safety/medical concerns (paraplegia, fatigue)         Walk 10 feet on uneven surface  activity   Assist Walk 10 feet on uneven surfaces activity did not occur: Safety/medical concerns (paraplegia, fatigue)         Wheelchair     Assist Is the patient using a wheelchair?: Yes Type of Wheelchair: Manual Wheelchair activity did not occur: Safety/medical concerns (paraplegia, fatigue)         Wheelchair 50 feet with 2 turns activity    Assist    Wheelchair 50 feet with 2 turns activity did not occur: Safety/medical concerns (paraplegia, fatigue)       Wheelchair 150 feet activity     Assist  Wheelchair 150 feet activity did not occur: Safety/medical concerns (paraplegia, fatigue)       Blood pressure (!) 140/54, pulse 78, temperature 98.4 F (36.9 C), temperature source Oral, resp. rate 19, height 5' (1.524 m), weight 50.2 kg, SpO2 99%.  Medical Problem List and Plan: 1. 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  Antithrombotics: -DVT/anticoagulation:  Pharmaceutical: Eliquis 2.5mg  BID             -antiplatelet therapy: Aspirin 81 mg daily   3. Pain Management: Tylenol, robaxin, oxycodone as needed 10/24- will wait to try Tramadol for pain/discomfort til have tried  Remeron 7.5mg  for sleep 10/28- pt doesn't want Remeron, but has been getting it- I don't think she knows if getting or not 4. Mood/Behavior/Sleep: LCSW to evaluate and provide emotional support             -continue paroxetine 20 mg daily (hx of depression)              -antipsychotic agents: n/a -12/13/22 hasn't been sleeping well, increase melatonin to 10mg  QHS, added Trazodone 25mg  PRN but advised to be cautious about using it too late.  10/21- will stop Trazodone- can impair ability to pee- will start Restoril 7.5 mg at bedtime for sleep.  10/22- cannot start Remeron due to risk of torsades de pointes- will con't Restoril  10/23- didn't sleep well again- will increase Restoril to 15 mg at bedtime 10/24- spoke to Cards- they are ok trying Remeron but wait on Tramadol-will start 15 mg at bedtime -will stop Restoril>> down to 7.5mg  -12/19/22 slept "wonderfully"! Cont regimen 10/28- Slept poorly- still on rmeron- sounds like every other night she slept well 5. Neuropsych/cognition: This patient is capable of making decisions on her own behalf.   6. Skin/Wound Care: Routine skin care checks   7. Fluids/Electrolytes/Nutrition: Routine Is and Os and follow-up chemistries   8: Hypertension: monitor TID and prn (hx of a.fib see meds below). Monitor with mobility. Continue metoprolol 25mg  BID  -12/13/22 BPs a little up, monitor for trend 10/21- BP's running high up to 180s- systolic- but had documented BP 87/64 this AM- will wait to titrate BP meds. 10/22- BP still in 150s- but no LOW BP's this AM- will see how does in therapy- 10/23- reduced Metoprolol per pt request to 12.5 mg BID- might need Norvasc for BP control without affecting HR  10/24- will add Norvasc 2.5 mg daily since BP elevated and not coming down  10/25- calling IM- maybe they can help- just added Norvasc yesterday -12/19/22 BPs improving a little this morning, monitor -12/20/22 BPs still a tad elevated but stable, monitor a couple more days before deciding on changes for now 10/28- BP on low side, but Hb low- will transfuse 2 units Vitals:   12/18/22 1501 12/18/22 1940 12/19/22 0353 12/19/22 1239  BP: (!) 170/62 (!) 159/61 133/85 124/62   12/19/22 1450 12/19/22 1928 12/20/22 0514 12/20/22  1643  BP: (!) 148/63 (!) 145/56 (!) 148/50 (!) 148/52   12/20/22 1944 12/21/22 0335 12/21/22 0759 12/21/22 0857  BP: (!) 147/71 (!) 156/64 (!) 107/90 (!) 140/54  9: Hyperlipidemia: continue pravastatin 40mg  daily   10: COPD, O2 dependent 2L via [place]:             -continue Brovana nebs BID             -continue Yupelri neb daily   10/21- Has been on 2L O2 at home per pt and chart  10/25- ABG shows CO2 too low- needs to reduce O2 levels 11: CAD: stable on asa and statin   12: Atrial fibrillation, paroxysmal: maintained on Eliquis reversed pre-operatively             -continue amiodarone 100 mg daily             -continue metoprolol tartrate 25 mg BID             -cleared by [facility] to re-start Eliquis>pharmacy consult placed   10/25- reduced Metoprolol to 12.5 mg BID earlier in week - doing better 13: 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 10/22- not voiding -U/A (-)  will add Flomax 0.4 mg q supper to see if she can void- I don't think it's likely. D/w nursing due to low amount of caths-  10/23- pt reports still getting cathed- is better 10/24- pt swears drinking 6 cups/water day, however BUN up to 27 and Cr 1.11- and cath volumes very low- not clear if having incontinence of bladder 10/25- pt said incontinent of bladder as well as cath overnight- monitor -12/19/22 U/A overnight looks  infectious, will start Bactrim DS BID x5d, UCx pending; monitor -12/20/22 UCx with >100k CFU of K. Pneumoniae, sensitive to bactrim, cont abx course 17: ABLA: stable; follow CBC trend             -consider oral iron supplement -12/13/22 Hgb slowly downtrending from postop 8.8 on 10/14>7.6>7.5>7.3>7.0 yesterday; pt asymptomatic at this time; would recheck tomorrow but if still at the 7.0-range then would consider transfusion to help with therapy tolerance; unclear what her prior baseline was (has hgb 12.2 back in July but then down to 9's).    10/21- Hb 7.4 today  10/23- Hb 7.2 yesterday- will check in AM 10/24- Hb down to 7.0- will d/w pt if she wants to be transfused. If so, will give 1 unit pRBCs.  Doesn't have location of losing blood- will check FOBT x3  10/25- Hb 8.1- likely too dry  10/28- Hb 6.7- will transfuse 2 units 18: AKI: improving; CMP 12/12/22 showing Cr 1.31; monitor Monday BMPs 10/21- Cr 1.09 down from 1.31 and BUN 25 down from 25- however is drinking poorly and has poor output- 10/23- Says drinking better- will check BMP in AM  10/25- Cr 0.9 and BUN slightly high at 22- but based on Hb increase, likely dry 10/28- Cr 1.4 and BUN elevated- will give blood and recheck BMP in AM  19: 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  10/22- continue to refuse bowel program-in spite of education of bowel program and need for it.  10/23- pt did last night- had BM with bowel program and one this AM, but educated can take 3-6 weeks to train gut, but should be better- had bowel incontinence for 20 years 10/24- no bowel program documented  but had small BM's at midnight and 5am- said wasn't incontinent?will check on  this 10/25- asked pt to get more dig stim this evening since likely cause of bowel incontinent in AM -12/20/22 reports BM this morning but no BM since 12/17/22 documented, no report to nursing staff given; monitor closely but if no BM by tomorrow may need to consider additional adjustments 20. Poor Appetite 10/22- will add Megace for poor appetite since cannot add Remeron and too sedated to add Periactin. Will also order push fluids since not drinking great- although her BUN?Cr looking better- BUN 25 and Cr 1.08-  10/23- pt feels she's drinking 6+ cups/day- ate muffin and some eggs this AM- better than last 2 days-  10/24- 25% of tray at most- will add Remeron 15 mg at bedtime- ok'd by Cards.  10/25- changed remeron to 7.5 mg QHS  21. Prolonged QT interval 10/24- spoke with Cards- they suggested starting Remeron 15 mg at bedtime and then rechecking EKG ina few days to see if can add Tramadol.    22. Oversedation/lethargy/suspected UTI 10/25- septis and ABG work up- when speaking with PA- appeared to have ST elevation- asked them to call IM consult-  -12/19/22 sleepy but arousable today, U/A+, started bactrim as above, awaiting UCx; CT head neg last night, CBC/BMP stable, TSH/T4 WNL, CXR stable; suspect UTI as source. Monitor. See above #16   23. L radial pulse nonpalpable but dopplerable, BPs different in that arm-- unclear when this started, had some pain in L deltoid area yesterday but seemed to be better today. Suspect this pulse/BP discrepancy is 2/2 her vascular issue already, but reached out to vascular surgery to verify. Dr. Karin Lieu returned page, stated this is known since her surgery; nothing to do or be concerned about.   24. Pulsatile abdominal aorta: not noticed previously, not mentioned in notes, but today 12/20/22 I noticed it was very prominently pulsatile and pt reported it being sore in that area; spoke with Dr. Karin Lieu again of [facility], recommended getting CTA Abd/pelv to evaluate for  AAA. Will await results.    I spent a total of  51   minutes on total care today- >50% coordination of care- due to  D/w PA and nursing at length about getting pt blood- and review of labs; and vitals- complex medical decision making.    LOS: 9 days A FACE TO FACE EVALUATION WAS PERFORMED  Lynn Recendiz 12/21/2022, 9:01 AM

## 2022-12-21 NOTE — Plan of Care (Signed)
Pt's plan of care adjusted to 15/7 after speaking with care team and discussed with MD in team conference as pt currently unable to tolerate current therapy schedule with OT, PT, and SLP.   Truitt Leep PT, DPT

## 2022-12-21 NOTE — Progress Notes (Signed)
IP Rehab Bowel Program Documentation   Bowel Program Start time 1820   Dig Stim Indicated? Yes  Dig Stim Prior to Suppository or mini Enema X 1   Output from dig stim: none    Ordered intervention: Suppository {YES/NO:21197}, mini enema {YES/NO:21197},   Repeat dig stim after Suppository or Mini enema  X {Numbers; 1-5:17750},  Output? {Desc; minimal/small/moderate/large/very large:110034}   Bowel Program Complete? {YES/NO:21197}, handoff given yes   Patient Tolerated? {YES/NO:21197}

## 2022-12-21 NOTE — Evaluation (Addendum)
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) Active Problems:   Moderate episode of recurrent major depressive disorder [facility])  Past Medical History:  Past Medical History:  Diagnosis Date   Acute foot pain, right 04/15/2022   Acute on chronic respiratory failure with hypoxia (HCC) 08/30/2022   Age-related osteoporosis without current pathological fracture 04/06/2018   ALLERGIC RHINITIS 05/20/2009   Qualifier: Diagnosis of   By: Excell Seltzer CMA, Lawson Fiscal      Replacing diagnoses that were inactivated after the 05/25/22 regulatory import   Anxiety    Arthritis, multiple joint involvement 04/06/2018   C O P D 05/20/2009   Qualifier: Diagnosis of   By: Excell Seltzer CMA, Lori      Replacing diagnoses that were inactivated after the 05/25/22 regulatory import   CAD (coronary artery disease) 06/29/2022   Calculus of gallbladder with chronic cholecystitis without obstruction 04/30/2016   CAP (community acquired pneumonia) 05/13/2019   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/26/2020   Cigarette nicotine dependence without complication 12/26/2020   COPD with acute exacerbation (HCC) 08/30/2022   Elevated brain natriuretic peptide (BNP) level 08/30/2022   G E R D 05/20/2009   Qualifier: Diagnosis of   By: Excell Seltzer CMA, Lori       Generalized edema 08/08/2019   History of CVA (cerebrovascular accident) 12/26/2020   Hypertension    Hypoxia 05/13/2019   Irritable bowel syndrome with diarrhea 09/27/2019   Leukocytosis 05/13/2019   Lipoma of torso 04/07/2019   Mixed dyslipidemia 04/06/2018   NSVT (nonsustained ventricular tachycardia)  (HCC) 05/13/2019   Paroxysmal atrial fibrillation (HCC) 06/29/2022   Prolonged Q-T interval on ECG 05/13/2019   Recurrent major depressive disorder, in full remission (HCC) 04/06/2018   Respiratory infection 04/15/2022   Right inguinal hernia 03/02/2018   Sebaceous cyst 04/07/2019   TOBACCO ABUSE 05/21/2009   Qualifier: Diagnosis of   By: Craige Cotta MD, Vineet       Past Surgical History:  Past Surgical History:  Procedure Laterality Date   CHOLECYSTECTOMY     ENDARTERECTOMY FEMORAL Right 12/07/2022   Procedure: RIGHT FEMORAL ENDARTERECTOMY;  Surgeon: Daria Pastures, MD;  Location: [facility] OR;  Service: Vascular;  Laterality: Right;   HERNIA REPAIR     PATCH ANGIOPLASTY Right 12/07/2022   Procedure: RIGHT FEMORAL PATCH ANGIOPLASTY USING BOVINE PATCH;  Surgeon: Daria Pastures, MD;  Location: [facility] OR;  Service: Vascular;  Laterality: Right;   THORACIC AORTIC ENDOVASCULAR STENT GRAFT N/A 12/07/2022   Procedure: TEVAR;  Surgeon: Daria Pastures, MD;  Location: [facility] OR;  Service: Vascular;  Laterality: N/A;   ULTRASOUND GUIDANCE FOR VASCULAR ACCESS  12/07/2022   Procedure: ULTRASOUND GUIDANCE FOR VASCULAR ACCESS;  Surgeon: Daria Pastures, MD;  Location: [facility] OR;  Service: Vascular;;    Assessment / Plan / Recommendation Clinical Impression   Heather Molina is an 82 year old female with a history of paroxysmal atrial fibrillation maintained on Eliquis who presented to [facility] on 12/06/2022 with acute chest and back pain.  Imaging was consistent with acute intramural hematoma from the LSA to the distal descending thoracic aorta with concerning signs of impending rupture and possible active bleeding  from thoracic aortic aneurysm.  She was started on esmolol and transferred to the [facility] campus.  Vascular surgery consulted and operative consent obtained for TEVAR. She was taken to the operating room by Dr. Hetty Blend and underwent TEVAR with coverage of LSA, right femoral endarterectomy and patch  angioplasty.  She was extubated and transferred to the intensive care unit for observation.  Critical care management consulted for ICU management.  On postop day 1, she was unable to move her feet. Discussion by Dr. Hetty Blend with patient and family regarding spinal cord ischemia.  She was told of high likelihood of long-term paralysis.  It was explained to her that pressure elevation and lumbar drain placement was the only treatment to maximize recovery potential.  The patient deferred undergoing any further procedures.  She is tolerating regular diet. The patient requires inpatient medicine and rehabilitation evaluations and services for ongoing dysfunction secondary to spinal card ischemia. Patient was referred for SLP evaluation by MD.   Attempted to complete SLUMS assessment in order to evaluate cognition. Patient was initially very pleasant and engaging. She verbalized recent medical hx along with PLOF. She talked about her family and then discussed increased confusion and lethargy from this weekend. She reports feeling back to her baseline. As SLP initiated evaluation, patient was oriented to temporal, spatial and situational concepts. As evaluation progressed toward calculation and recall tasks pt appeared to become increasingly irritated as indicated by turning away from clinician, short responses and decreased participation. Pt ultimately stating, "I don't need that." "If I was crazy I would have done something about it. I'm just not into doing that." SLP discontinued assessment per patient wishes. Portions of session completed revealed deficits in executive function, delayed recall and language however; SLP unsure of accuracy and suspect some self limiting reflected in task completed due to pt irritability and lack of participation. In discussion with patients therapist, they report patient is functioning at her baseline and symptoms appeared to be related to UTI with resolution complete. Patient has  highly involved family who can support patient as needed.  SLP not recommending skilled intervention at this time.   Skilled Therapeutic Interventions          informal assessment measures and SLUMS administered. Please see full report for additional details.     SLP Assessment  Patient does not need any further Speech Lanaguage Pathology Services    Recommendations  SLP Diet Recommendations: Age appropriate regular solids;Thin Liquid Administration via: Straw;Cup Medication Administration: Whole meds with liquid Supervision: Patient able to self feed Compensations: Slow rate;Small sips/bites Postural Changes and/or Swallow Maneuvers: Seated upright 90 degrees Oral Care Recommendations: Oral care BID Patient destination: Home Follow up Recommendations: None Equipment Recommended: None recommended by SLP    SLP Frequency     SLP Duration  SLP Intensity  SLP Treatment/Interventions            Pain Pain Assessment Pain Scale: 0-10 Pain Score: 0-No pain  Prior Functioning Cognitive/Linguistic Baseline: Within functional limits Type of Home: House  Lives With: Daughter Available Help at Discharge: Family;Available 24 hours/day Vocation: Retired  Architectural technologist Overall Cognitive Status: No family/caregiver present to determine baseline cognitive functioning Arousal/Alertness: Awake/alert Orientation Level: Oriented X4 Year: 2024 Month: October Day of Week: Correct Attention: Sustained;Selective Selective Attention: Appears intact Memory: Impaired Memory Impairment: Decreased short term memory Awareness: Impaired Awareness Impairment: Intellectual impairment Problem Solving: Impaired Problem Solving Impairment: Verbal basic;Functional basic Safety/Judgment: Appears intact  Comprehension Auditory Comprehension Overall Auditory Comprehension: Appears  within functional limits for tasks assessed Expression Expression Primary Mode of Expression:  Verbal Written Expression Dominant Hand: Right Oral Motor Oral Motor/Sensory Function Overall Oral Motor/Sensory Function: Within functional limits  Care Tool Care Tool Cognition Ability to hear (with hearing aid or hearing appliances if normally used Ability to hear (with hearing aid or hearing appliances if normally used): 0. Adequate - no difficulty in normal conservation, social interaction, listening to TV   Expression of Ideas and Wants Expression of Ideas and Wants: 4. Without difficulty (complex and basic) - expresses complex messages without difficulty and with speech that is clear and easy to understand   Understanding Verbal and Non-Verbal Content Understanding Verbal and Non-Verbal Content: 4. Understands (complex and basic) - clear comprehension without cues or repetitions  Memory/Recall Ability Memory/Recall Ability : Current season;That he or she is in a hospital/hospital unit;Staff names and faces    Bedside Swallowing Assessment General Temperature Spikes Noted: No Respiratory Status: Room air History of Recent Intubation: No Behavior/Cognition: Alert;Cooperative;Fusing/Irritable Patient Positioning: Upright in bed Baseline Vocal Quality: Hoarse  Oral Care Assessment Oral Assessment  (WDL): Within Defined Limits Lips: Symmetrical Teeth: Partial plate lower;Partial plate upper;Missing (Comment) Tongue: Pink;Moist Mucous Membrane(s): Moist;Pink Saliva: Moist, saliva free flowing Level of Consciousness: Alert Is patient on any of following O2 devices?: None of the above Nutritional status: No high risk factors Oral Assessment Risk : Low Risk Ice Chips Ice chips: Not tested Thin Liquid Thin Liquid: Within functional limits Nectar Thick Nectar Thick Liquid: Not tested Honey Thick Honey Thick Liquid: Not tested Puree Puree: Not tested Solid Solid: Not tested BSE Assessment Risk for Aspiration Impact on safety and function: No limitations Other Related  Risk Factors: Cognitive impairment  Short Term Goals: Week 1: SLP Short Term Goal 1 (Week 1): N/A  Refer to Care Plan for Long Term Goals  Recommendations for other services: None   Discharge Criteria: Patient will be discharged from SLP if patient refuses treatment 3 consecutive times without medical reason, if treatment goals not met, if there is a change in medical status, if patient makes no progress towards goals or if patient is discharged from hospital.  The above assessment, treatment plan, treatment alternatives and goals were discussed and mutually agreed upon: by patient  Renaee Munda 12/21/2022, 4:47 PM

## 2022-12-21 NOTE — Consult Note (Signed)
Neuropsychological Consultation Comprehensive Inpatient Rehab   Patient:   Heather Molina   DOB:   Mar 04, 1940  MR Number:  [id number]  [facility]:  [facility] [address] CENTER A [address] [place] [address] Dept: [phone] Loc: [phone]           Date of Service:   12/21/2022  Start Time:   8 AM End Time:   9 AM  Provider/Observer:  Arley Phenix, Psy.D.       Clinical Neuropsychologist       Billing Code/Service: [phone]  Reason for Service:    Heather Molina is an 82 year old female referred for neuropsychological consultation due to coping and adjustment issues with recent spinal cord injury and total paraplegia post vascular surgery.  Patient has a past history of major depressive disorder and has been restarted on Paxil which was a prior medication that was used successfully.  Patient has a past medical history including A-fib and was on anticoagulation medications.  Patient presented to [facility] on 12/06/2022 reporting acute chest pain and back pain.  Workup identified impending rupture and/or possible active bleed from thoracic aortic aneurysm.  Patient transferred to [facility] with vascular consultation and patient underwent TEVAR.  Patient was extubated and transferred to the intensive care unit for observation.  On postop day 1 patient was unable to to move her feet with identification of a spinal cord ischemic event with high likelihood of long-term paralysis.  Patient deferred undergoing any further procedure and patient continued with ongoing motor deficits for bilateral lower extremities post spinal cord ischemia and referred to the comprehensive inpatient rehabilitation unit.  During today's clinical interview, the patient was awake and alert without display of significant emotional distress.  Patient reports that she does have worries about how much her daughter will be able to do for her and has made  passing suggestions of "they should just put me in a nursing home."  Patient has had times where she is felt like she cannot do all the things that are being asked of her and still verbalizes hope that she will be able to at least use a walker to walk around in the future.  Today, we addressed the need for her to work on strengthening her upper body to allow for better transfers and engagement.  However, the patient is quite frail and already on oxygen.  Patient was oriented x 4 and denied severe depressive event and reports that she feels like she is doing fairly well from a mood standpoint.  Patient is in need of the bowel program but reports that her history of IBS and diarrhea complicate these efforts.  HPI for the current admission:    HPI: Heather Molina is an 82 year old female with a history of paroxysmal atrial fibrillation maintained on Eliquis who presented to [facility] on 12/06/2022 with acute chest and back pain.  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  She was started on esmolol and transferred to the [facility] campus.  Vascular surgery consulted and operative consent obtained for TEVAR. She was taken to the operating room by Dr. Hetty Blend and underwent TEVAR with coverage of LSA, right femoral endarterectomy and patch angioplasty.  She was extubated and transferred to the intensive care unit for observation.  Critical care management consulted for ICU management.  On postop day 1, she was unable to move her feet. Discussion by Dr. Hetty Blend with patient and family regarding spinal cord ischemia.  She was told of high likelihood of long-term paralysis.  It was explained to her that pressure elevation and lumbar drain placement was the only treatment to maximize recovery potential.  The patient deferred undergoing any further  procedures.  Blood pressure control with MAP goal greater than 100 for 48 hours continued.  She required replacement of Foley catheter on 10/16 due to retention.  PT and OT evaluations obtained.  She remains on heparin subcutaneously for DVT prophylaxis.  She has been cleared to restart Eliquis by vascular surgery as of 10/18.  Heart rate controlled on Lopressor and Pacerone.  History of COPD and continues with nebulizer treatments.  She is oxygen dependent and maintaining saturations on 2 L via nasal cannula.  Appears to have had acute kidney injury and unknown history of chronic kidney disease.  Serum creatinine continues to improve.  She has had 450 cc of urine output last 24 hours 10/17-10/18. She is tolerating regular diet. The patient requires inpatient medicine and rehabilitation evaluations and services for ongoing dysfunction secondary to spinal card ischemia.  Medical History:   Past Medical History:  Diagnosis Date   Acute foot pain, right 04/15/2022   Acute on chronic respiratory failure with hypoxia (HCC) 08/30/2022   Age-related osteoporosis without current pathological fracture 04/06/2018   ALLERGIC RHINITIS 05/20/2009   Qualifier: Diagnosis of   By: Excell Seltzer CMA, Lawson Fiscal      Replacing diagnoses that were inactivated after the 05/25/22 regulatory import   Anxiety    Arthritis, multiple joint involvement 04/06/2018   C O P D 05/20/2009   Qualifier: Diagnosis of   By: Excell Seltzer CMA, Lori      Replacing diagnoses that were inactivated after the 05/25/22 regulatory import   CAD (coronary artery disease) 06/29/2022   Calculus of gallbladder with chronic cholecystitis without obstruction 04/30/2016   CAP (community acquired pneumonia) 05/13/2019   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/26/2020   Cigarette nicotine dependence without complication 12/26/2020   COPD with acute exacerbation (HCC) 08/30/2022   Elevated brain natriuretic peptide (BNP) level 08/30/2022   G E R D  05/20/2009   Qualifier: Diagnosis of   By: Excell Seltzer CMA, Lori       Generalized edema 08/08/2019   History of CVA (cerebrovascular accident) 12/26/2020   Hypertension    Hypoxia 05/13/2019   Irritable bowel syndrome with diarrhea 09/27/2019   Leukocytosis 05/13/2019   Lipoma of torso 04/07/2019   Mixed dyslipidemia 04/06/2018   NSVT (nonsustained ventricular tachycardia) (HCC) 05/13/2019   Paroxysmal atrial fibrillation (HCC) 06/29/2022   Prolonged Q-T interval on ECG 05/13/2019   Recurrent major depressive disorder, in full remission (HCC) 04/06/2018   Respiratory infection 04/15/2022   Right inguinal hernia 03/02/2018   Sebaceous cyst 04/07/2019   TOBACCO ABUSE 05/21/2009   Qualifier: Diagnosis of   By: Craige Cotta MD, Vineet             Patient Active Problem List   Diagnosis Date Noted   Moderate episode of recurrent major depressive disorder (HCC) 12/21/2022   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) 12/12/2022   Intramural aortic hematoma (HCC) 12/08/2022   Spinal cord infarction (HCC) 12/08/2022   Hyperglycemia 12/08/2022   Status post surgery 12/07/2022   Thoracic aortic dissection (HCC) 12/07/2022   Acute on chronic respiratory failure with hypoxia (HCC) 08/30/2022  Elevated brain natriuretic peptide (BNP) level 08/30/2022   Paroxysmal atrial fibrillation (HCC) 06/29/2022   CAD (coronary artery disease) 06/29/2022   Hypertension 06/02/2022   Acute foot pain, right 04/15/2022   Respiratory infection 04/15/2022   History of CVA (cerebrovascular accident) 12/26/2020   Cigarette nicotine dependence without complication 12/26/2020   Irritable bowel syndrome with diarrhea 09/27/2019   Generalized edema 08/08/2019   CAP (community acquired pneumonia) 05/13/2019   Hypoxia 05/13/2019   NSVT (nonsustained ventricular tachycardia) (HCC) 05/13/2019   Leukocytosis 05/13/2019   Prolonged Q-T interval on ECG 05/13/2019   Anxiety 05/13/2019   Lipoma of torso  04/07/2019   Age-related osteoporosis without current pathological fracture 04/06/2018   Arthritis, multiple joint involvement 04/06/2018   Mixed dyslipidemia 04/06/2018   Recurrent major depressive disorder, in full remission (HCC) 04/06/2018   Right inguinal hernia 03/02/2018   Calculus of gallbladder with chronic cholecystitis without obstruction 04/30/2016   TOBACCO ABUSE 05/21/2009   ALLERGIC RHINITIS 05/20/2009   C O P D 05/20/2009   G E R D 05/20/2009    Behavioral Observation/Mental Status:   Darlina Shiverdecker Geng  presents as a 82 y.o.-year-old Right handed Caucasian Female who appeared her stated age. her dress was Appropriate and she was Well Groomed and her manners were Appropriate to the situation.  her participation was indicative of Appropriate and Redirectable behaviors.  There were physical disabilities noted.  she displayed an appropriate level of cooperation and motivation.    Interactions:    Active Appropriate  Attention:   within normal limits and attention span and concentration were age appropriate  Memory:   within normal limits; recent and remote memory intact  Visuo-spatial:   not examined  Speech (Volume):  normal  Speech:   normal; normal  Thought Process:  Coherent and Relevant  Coherent and Directed  Though Content:  WNL; not suicidal and not homicidal  Orientation:   person, place, time/date, and situation  Judgment:   Fair  Planning:   Fair  Affect:    Appropriate  Mood:    Dysphoric  Insight:   Good  Intelligence:   normal  Psychiatric History:  Prior history of recurrent major depressive disorder.  Patient has been on Paxil in the past and is currently taking Paxil on the unit.  Family Med/Psych History:  Family History  Problem Relation Age of Onset   Epilepsy Father    CVA Father      Impression/DX:   VIOLANDA MASCORRO is an 82 year old female referred for neuropsychological consultation due to coping and adjustment issues with recent  spinal cord injury and total paraplegia post vascular surgery.  Patient has a past history of major depressive disorder and has been restarted on Paxil which was a prior medication that was used successfully.  Patient has a past medical history including A-fib and was on anticoagulation medications.  Patient presented to [facility] on 12/06/2022 reporting acute chest pain and back pain.  Workup identified impending rupture and/or possible active bleed from thoracic aortic aneurysm.  Patient transferred to [facility] with vascular consultation and patient underwent TEVAR.  Patient was extubated and transferred to the intensive care unit for observation.  On postop day 1 patient was unable to to move her feet with identification of a spinal cord ischemic event with high likelihood of long-term paralysis.  Patient deferred undergoing any further procedure and patient continued with ongoing motor deficits for bilateral lower extremities post spinal cord ischemia and referred to the comprehensive inpatient  rehabilitation unit.  During today's clinical interview, the patient was awake and alert without display of significant emotional distress.  Patient reports that she does have worries about how much her daughter will be able to do for her and has made passing suggestions of "they should just put me in a nursing home."  Patient has had times where she is felt like she cannot do all the things that are being asked of her and still verbalizes hope that she will be able to at least use a walker to walk around in the future.  Today, we addressed the need for her to work on strengthening her upper body to allow for better transfers and engagement.  However, the patient is quite frail and already on oxygen.  Patient was oriented x 4 and denied severe depressive event and reports that she feels like she is doing fairly well from a mood standpoint.  Patient is in need of the bowel program but reports that her  history of IBS and diarrhea complicate these efforts.  Disposition/Plan:  We worked on coping and adjustment issues particularly around the issue of her paralysis and focus on building on capability she can do now rather than frustration of her inability to walk or stand and using those lack of ability to essentially not engage much in therapies.  Patient admits to complaining about therapies as her frustration with inability to do what she had been doing before have remained her focus rather than building on what she can do.  Diagnosis:    History of major depressive disorder with recurrence with significant physical change post spinal cord ischemic event.         Electronically Signed   _______________________ Arley Phenix, Psy.D. Clinical Neuropsychologist

## 2022-12-22 LAB — TYPE AND SCREEN
ABO/RH(D): O POS
Antibody Screen: NEGATIVE
Unit division: 0
Unit division: 0

## 2022-12-22 LAB — CBC WITH DIFFERENTIAL/PLATELET
Abs Immature Granulocytes: 0.07 10*3/uL (ref 0.00–0.07)
Basophils Absolute: 0.1 10*3/uL (ref 0.0–0.1)
Basophils Relative: 1 %
Eosinophils Absolute: 0.3 10*3/uL (ref 0.0–0.5)
Eosinophils Relative: 4 %
HCT: 31 % — ABNORMAL LOW (ref 36.0–46.0)
Hemoglobin: 9.9 g/dL — ABNORMAL LOW (ref 12.0–15.0)
Immature Granulocytes: 1 %
Lymphocytes Relative: 15 %
Lymphs Abs: 1.3 10*3/uL (ref 0.7–4.0)
MCH: 27.3 pg (ref 26.0–34.0)
MCHC: 31.9 g/dL (ref 30.0–36.0)
MCV: 85.6 fL (ref 80.0–100.0)
Monocytes Absolute: 1.2 10*3/uL — ABNORMAL HIGH (ref 0.1–1.0)
Monocytes Relative: 14 %
Neutro Abs: 5.7 10*3/uL (ref 1.7–7.7)
Neutrophils Relative %: 65 %
Platelets: 266 10*3/uL (ref 150–400)
RBC: 3.62 MIL/uL — ABNORMAL LOW (ref 3.87–5.11)
RDW: 16 % — ABNORMAL HIGH (ref 11.5–15.5)
WBC: 8.7 10*3/uL (ref 4.0–10.5)
nRBC: 0 % (ref 0.0–0.2)

## 2022-12-22 LAB — BPAM RBC
Blood Product Expiration Date: 202411202359
Blood Product Expiration Date: 202411202359
ISSUE DATE / TIME: 202410281616
ISSUE DATE / TIME: 202410281046
Unit Type and Rh: 5100
Unit Type and Rh: 5100

## 2022-12-22 LAB — BASIC METABOLIC PANEL
Anion gap: 14 (ref 5–15)
BUN: 40 mg/dL — ABNORMAL HIGH (ref 8–23)
CO2: 19 mmol/L — ABNORMAL LOW (ref 22–32)
Calcium: 8.4 mg/dL — ABNORMAL LOW (ref 8.9–10.3)
Chloride: 101 mmol/L (ref 98–111)
Creatinine, Ser: 1.41 mg/dL — ABNORMAL HIGH (ref 0.44–1.00)
GFR, Estimated: 37 mL/min — ABNORMAL LOW (ref >60)
Glucose, Bld: 92 mg/dL (ref 70–99)
Potassium: 4.9 mmol/L (ref 3.5–5.1)
Sodium: 134 mmol/L — ABNORMAL LOW (ref 135–145)

## 2022-12-22 MED ORDER — SODIUM CHLORIDE 0.9 % IV SOLN: Freq: Once | INTRAVENOUS | Status: AC

## 2022-12-22 NOTE — Progress Notes (Signed)
Occupational Therapy Session Note  Patient Details  Name: Heather Molina MRN: [id number] Date of Birth: [birth year]  Today's Date: 12/22/2022 OT Individual Time: 0802-0900 OT Individual Time Calculation (min): 58 min    Short Term Goals: Week 2:  OT Short Term Goal 1 (Week 2): Pt will complete commode transfer Max A with transfer board OT Short Term Goal 2 (Week 2): Pt will complete pressure relief without cues 25% of time throughout sessions  Skilled Therapeutic Interventions/Progress Updates:   Pt seen for am self care session bed level then PWC sink side level. OT instruction in use of bed features for trunk support with simple bathing, figure 4 for pants with reacher training and slip on shoes after TED hose donned. Leg loops applied. Moved EOB with improved grasp on R leg loop and mod A EOB with strep under feet for support. Issued elbow pads for skin protection prior to TB lateral transfer bed to PWC. Use of tilt feature training for positioning and pressure relief instruction. No SOB noted with O2 via [place]. Pt sat sink side for oral and hair care as well as pull over shirt with trunk control and reach excursions facilitated. Hand off to nursing for meds with all needs, PWC seat belt in place and nurse call button in reach.   Pain: denied pain but added elbow pads for pressure relief and skin protection during lateral leans and trunk weight shift/weakness compensation   Therapy Documentation Precautions:  Precautions Precautions: Fall Precaution Comments: paraplegia Restrictions Weight Bearing Restrictions: No   Therapy/Group: Individual Therapy  Vicenta Dunning 12/22/2022, 7:40 AM

## 2022-12-22 NOTE — Progress Notes (Addendum)
Physical Therapy Session Note  Patient Details  Name: Heather Molina MRN: [id number] Date of Birth: 06/16/1940  Today's Date: 12/22/2022 PT Individual Time: 1030-1100 and 1300-1345 PT Individual Time Calculation (min): 30 min and 45 min  Short Term Goals: Week 2:     Skilled Therapeutic Interventions/Progress Updates:   First session:  Pt presents sitting in PWC and requests to return to bed.  O2 removed at 98% and remained >96% on RA.  Pt negotiates PWC > 150' in low speed w/ supervision.  Pt requires increased time in confined spaces.  Pt returned to room for positioning at bedside.  Pt required hands-on from PT for completing positioning at bedside.  Pt leans to L, w/ max A for positioning SB and max A for transfer to bed.  Max A for sit to supine w/ pt maneuvering LLE onto bed.  Pt remained in bed w/ all needs in reach.  Second session:  Pt presents semi-reclined in bed and agreeable to therapy, although c/o fatigue.  Pt transfers sup to sit w/ max A, but pt able to move LES to EOB using leg loops.  Pt sat EOB w/ min A and cues for seated balance for negotiating PWC in position.  Pt requires max A for SB transfer bed > w/c w/ assist to scoot back in w/c.  Pt negotiated PWC out of room and > 200' w/ supervision, but occasional hands-on A as pt approaches obstacles in hallways.  Pt returned to room and positions self alongside bed w/ cues from PT including backing up.  Pt educated on tilt capability of w/c and then verbal cues for pressure relief side to side.  Pt remained sitting in w/c w/ seat belt alarm on and all needs in reach, dtr present.     Therapy Documentation Precautions:  Precautions Precautions: Fall Precaution Comments: paraplegia Restrictions Weight Bearing Restrictions: No General:   Vital Signs:   Pain:0/10 both sessions.       Therapy/Group: Individual Therapy  Lucio Edward 12/22/2022, 11:02 AM

## 2022-12-22 NOTE — Progress Notes (Signed)
Patient ID: Heather Molina, female   DOB: 10/16/1940, 82 y.o.   MRN: [id number] Met with pt and spoke with daughter-[name] via telephone to inform of team conference progress toward her goals and target discharge date 11/19. Discussed having a family meeting with team and family along with pt to discuss questions, concerns and medical issues. Have set up for next Thursday 11/7 at 10:00-11:00 daughter will let her sister know. Pt is doing better today and feelign better. Pt is hopeful she will be able to move her legs with time and progress. Will continue to work on discharge needs.

## 2022-12-22 NOTE — Patient Care Conference (Signed)
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 Genice Rouge Social Worker Present: Dossie Der, LCSW Nurse Present: Vedia Pereyra, RN PT Present: Truitt Leep, PT OT Present: [name], OT PPS Coordinator present : Fae Pippin, SLP     Current Status/Progress Goal Weekly Team Focus  Bowel/Bladder   patient has not been voiding requiring q6 caths. Patient utilizes the bowel program for daily bowel movements   patient having regular bowel movements with use of bowel program   perform bowel program daily a the same time    Swallow/Nutrition/ Hydration               ADL's   Grooming: SBA, Oral hygiene: SBA,  UB dressing: SBA , LB dressing: Mod A bed level Footwear: total A  Transfers: Max A, bathing Min A in roll in shower chair   Min A   transfers, dynamic sitting balance, commode transfer    Mobility   mod bed mobility, max slide board transfer, min PWC propulsion   min A, w/c level supervision  PWC mobility, transfers    Communication                Safety/Cognition/ Behavioral Observations  Evaluation completed. Likely some cognitive deficits present but pt is functioning at her baseline.            Pain   patient did not complain of pain  tonight but does have chronic back pain   pain <5 on 0-10 pain scale   maintain current pain management with appears to be effective    Skin   brusing stage 2 on sacrum and heel foams   maintain skin integrity  assess skin qshift and prn encourage q2h turns and wearing of pravolon boots while in bed      Discharge Planning:  [name] has been in and observed she will be her main caregiver at home. 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 Wound care in place.  Bowel program on-going. Urinary retention with I/O caths every 6 hours. Back pain managed with PRN medications.  Hbg 6.7. DTI to sacrum. Left and right heels with stage II. Left hip stage II.  Incision to right groin with glue, no drainage.  BUN/Cr elevated.  PRAFO boot. Tolerating regular diet. Sleep chart. Oxygen at baseline.  Patient on target to meet rehab goals: Making slow progress with discharge date of 01/12/23  *See Care Plan and progress notes for long and short-term goals.   Revisions to Treatment Plan:  Continue flomax.  2 units PRBCs. IV fluids. Guaiac stools x 3 needed. Ortho consult. Family meeting next 12/31/22. Speech discharged due to patient at baseline.  Teaching Needs: Medications, bowel/bladder training, safety, self care, transfer training, skin care, etc.   Current Barriers to Discharge: Decreased caregiver support, Neurogenic bowel and bladder, and Wound care  Possible Resolutions to Barriers: Family education Independent with bowel/bladder program Independent with wound care and pressure relief Order recommended DME     Medical Summary Current Status: pt not drinking enough water- cath volumes low- bowel program last night- caths q6 hours- on flomax- no void- neurogenic bowel abnd bladder  Barriers to Discharge: Self-care education;Renal Insufficiency/Failure;Inadequate Nutritional  Intake;Neurogenic Bowel & Bladder;Incontinence;Behavior/Mood;Medical stability;Weight bearing restrictions;Other (comments);Symptomatic Anemia;Complicated Wound  Barriers to Discharge Comments: frail- limited by wound care- DTI sacrum- B/L heels- AKI- denial- O2 2L- at baseline- Possible Resolutions to [employer] Focus: ordered PRAFOs- needed Family conference next week- 10am Thursday- in power w/c- need w/c eval- has ramp that's being done- 2 units pRBCS yesterday for Hb 6.7- and IVFs for BUN 40 and Cr 1.41- d/c -11/19-   Continued Need for Acute Rehabilitation Level of Care: The patient requires daily medical management by a physician with specialized training in physical medicine and rehabilitation for the following reasons: Direction of a multidisciplinary physical rehabilitation program to maximize functional independence : Yes Medical management of patient stability for increased activity during participation in an intensive rehabilitation regime.: Yes Analysis of laboratory values and/or radiology reports with any subsequent need for medication adjustment and/or medical intervention. : Yes   I attest that I was present, lead the team conference, and concur with the assessment and plan of the team.   Jearld Adjutant 12/22/2022, 4:16 PM

## 2022-12-22 NOTE — Progress Notes (Signed)
IP Rehab Bowel Program Documentation   Bowel Program Start time 2030   Dig Stim Indicated? Yes  Dig Stim Prior to Suppository or mini Enema X 1   Output from dig stim: Minimal - none  Ordered intervention: Suppository Yes , mini enema No ,   Repeat dig stim after Suppository or Mini enema  X 2,  Output? Minimal - small amount of mucus  Bowel Program Complete? Yes , handoff given na   Patient Tolerated? Yes

## 2022-12-22 NOTE — Progress Notes (Signed)
Occupational Therapy Session Note  Patient Details  Name: Heather Molina MRN: [id number] Date of Birth: 1940/07/25  Today's Date: 12/22/2022 OT Individual Time: 1415-1539 OT Individual Time Calculation (min):  84 min    Short Term Goals: Week 2:  OT Short Term Goal 1 (Week 2): Pt will complete commode transfer Max A with transfer board OT Short Term Goal 2 (Week 2): Pt will complete pressure relief without cues 25% of time throughout sessions  Skilled Therapeutic Interventions/Progress Updates:      Therapy Documentation Precautions:  Precautions Precautions: Fall Precaution Comments: paraplegia Restrictions Weight Bearing Restrictions: No General: "I was waiting to see when you'd come!" Pt seated in W/C upon OT arrival, agreeable to OT. IV line fluids and 2L O2 on during session, daughter present in room.  Pain: no pain reported  ADL: Transfers: max A +2 with assistance to place board and wooden block with dycem under feet, W/C><mat table and W/C>bed  Balance Pt seated EOM completing various balance exercises in order to find center of gravity for increased independence with sitting ADLs. Pt sitting EOM ranging from CGA to Mod A for sitting balance with unilateral support AAT. Pt completing reaching out of BOS, switching hands to retrieve various items. Pt then completing chest press with light therapy ball with Max A for trunk support and management.   Other treatments: Pt completed PWC mobility with Min A room>therapy gym with assistance to maneuver through doorways and obstacles, VC for direction  Pt supine in bed with bed alarm activated, 2 bed rails up, call light within reach and 4Ps assessed. Daughter present in room   Therapy/Group: Individual Therapy  [name], OTD, OTR/L 12/22/2022, 4:07 PM

## 2022-12-22 NOTE — Progress Notes (Signed)
PROGRESS NOTE   Subjective/Complaints:  Pt reports feeling SO much better this AM after blood- said if "couldn't get up and move, would have more energy".  Denies spasms swears her legs were moving this Am and denies spasms said lifted them both 2-3 inches off bed.    ROS:    Pt denies SOB, abd pain, CP, N/V/C/D, and vision changes  Except for HPI  Objective:   CT Angio Abd/Pel w/ and/or w/o  Result Date: 12/20/2022 CLINICAL DATA:  Pulsatile abdominal mass EXAM: CTA ABDOMEN AND PELVIS WITHOUT AND WITH CONTRAST TECHNIQUE: Multidetector CT imaging of the abdomen and pelvis was performed using the standard protocol during bolus administration of intravenous contrast. Multiplanar reconstructed images and MIPs were obtained and reviewed to evaluate the vascular anatomy. RADIATION DOSE REDUCTION: This exam was performed according to the departmental dose-optimization program which includes automated exposure control, adjustment of the mA and/or kV according to patient size and/or use of iterative reconstruction technique. CONTRAST:  65mL OMNIPAQUE IOHEXOL 350 MG/ML SOLN COMPARISON:  CT chest angiogram, 12/06/2022, CT abdomen pelvis, 03/04/2018 FINDINGS: VASCULAR Partially imaged descending thoracic aortic stent endograft. Severe mixed calcific atherosclerosis. 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 Aneurysm is new compared to prior examination dated 2020. Standard branching pattern of the abdominal aorta with solitary bilateral renal arteries. Branch vessels are patent. Review of the MIP images confirms the above findings. NON-VASCULAR Lower Chest: Small bilateral pleural effusions and associated atelectasis or consolidation. Large hiatal hernia, incompletely imaged. Hepatobiliary: No focal liver abnormality is seen. Status post cholecystectomy. No biliary dilatation.  Pancreas: Unremarkable. No pancreatic ductal dilatation or surrounding inflammatory changes. Spleen: Normal in size without significant abnormality. Adrenals/Urinary Tract: Non nodular adenomatous thickening of the left adrenal gland, benign, requiring no further follow-up or characterization. Kidneys are normal, without renal calculi, solid lesion, or hydronephrosis. Bladder is unremarkable. Stomach/Bowel: Stomach is within normal limits. Appendix appears normal. Circumferential wall thickening of the low rectum with adjacent perirectal and presacral fat stranding and fluid (series 6, image 127, 134). Lymphatic: No enlarged abdominal or pelvic lymph nodes. Reproductive: No mass or other significant abnormality. Other: No abdominal wall hernia or abnormality. No ascites. Musculoskeletal: No acute osseous findings. IMPRESSION: 1. 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 Aneurysm is new compared to prior examination dated 2020. Recommend follow-up ultrasound every 3 years if not otherwise imaged and if clinically appropriate. (Ref.: J Vasc Surg. 2018; 67:2-77 and J Am Coll Radiol 2013;10(10):789-794.) 2. Circumferential wall thickening of the low rectum with adjacent perirectal and presacral fat stranding and fluid, consistent with nonspecific infectious or inflammatory proctitis. 3. Small bilateral pleural effusions and associated atelectasis or consolidation. 4. Large hiatal hernia, incompletely imaged. Aortic Atherosclerosis (ICD10-I70.0). Electronically Signed   By: Jearld Lesch M.D.   On: 12/20/2022 15:47   Recent Labs    12/21/22 0754 12/22/22 0522  WBC 7.3 8.7  HGB 6.7* 9.9*  HCT 22.2* 31.0*  PLT 365 266   Recent Labs    12/21/22 0754 12/22/22 0522  NA 135 134*  K 4.4  4.9  CL 104 101  CO2 21* 19*  GLUCOSE 139* 92  BUN 34* 40*  CREATININE 1.41* 1.41*  CALCIUM 8.2* 8.4*    Urinalysis    Component Value Date/Time    COLORURINE YELLOW 12/18/2022 1854   APPEARANCEUR HAZY (A) 12/18/2022 1854   LABSPEC 1.008 12/18/2022 1854   PHURINE 7.0 12/18/2022 1854   GLUCOSEU NEGATIVE 12/18/2022 1854   HGBUR SMALL (A) 12/18/2022 1854   BILIRUBINUR NEGATIVE 12/18/2022 1854   KETONESUR NEGATIVE 12/18/2022 1854   PROTEINUR NEGATIVE 12/18/2022 1854   NITRITE NEGATIVE 12/18/2022 1854   LEUKOCYTESUR MODERATE (A) 12/18/2022 1854      Intake/Output Summary (Last 24 hours) at 12/22/2022 0848 Last data filed at 12/22/2022 0048 Gross per 24 hour  Intake 1967 ml  Output 1325 ml  Net 642 ml     Pressure Injury 12/12/22 Sacrum Medial Deep Tissue Pressure Injury - Purple or maroon localized area of discolored intact skin or blood-filled blister due to damage of underlying soft tissue from pressure and/or shear. (Active)  12/12/22 1844  Location: Sacrum  Location Orientation: Medial  Staging: Deep Tissue Pressure Injury - Purple or maroon localized area of discolored intact skin or blood-filled blister due to damage of underlying soft tissue from pressure and/or shear.  Wound Description (Comments):   Present on Admission: Yes     Pressure Injury 12/20/22 Heel Left;Lateral;Posterior Stage 2 -  Partial thickness loss of dermis presenting as a shallow open injury with a red, pink wound bed without slough. (Active)  12/20/22 2000  Location: Heel  Location Orientation: Left;Lateral;Posterior  Staging: Stage 2 -  Partial thickness loss of dermis presenting as a shallow open injury with a red, pink wound bed without slough.  Wound Description (Comments):   Present on Admission: Yes     Pressure Injury 12/20/22 Heel Right;Lateral Stage 2 -  Partial thickness loss of dermis presenting as a shallow open injury with a red, pink wound bed without slough. (Active)  12/20/22 2000  Location: Heel  Location Orientation: Right;Lateral  Staging: Stage 2 -  Partial thickness loss of dermis presenting as a shallow open injury with a  red, pink wound bed without slough.  Wound Description (Comments):   Present on Admission: Yes     Pressure Injury 12/21/22 Ischial tuberosity Left;Medial Stage 2 -  Partial thickness loss of dermis presenting as a shallow open injury with a red, pink wound bed without slough. (Active)  12/21/22 0600  Location: Ischial tuberosity  Location Orientation: Left;Medial  Staging: Stage 2 -  Partial thickness loss of dermis presenting as a shallow open injury with a red, pink wound bed without slough.  Wound Description (Comments):   Present on Admission: Yes     Physical Exam: Vital Signs Blood pressure (!) 116/55, pulse (!) 54, temperature 98.1 F (36.7 C), resp. rate 17, height 5' (1.524 m), weight 50.2 kg, SpO2 99%.     General: awake, alert, appropriate, better color- sitting up in bed; said feels better; NAD HENT: conjugate gaze; oropharynx moist CV: regular to borderline bradycardic rate; no JVD Pulmonary: CTA B/L; no W/R/R- decreased at bases, but sounds better GI: soft, NT, ND, (+)BS- hypoactive Psychiatric: appropriate- more awake and interactive Neurological: Ox3- no increased tone or spasms seen MS; was able to move RLE< but from abd muscles- not in LE muscles- no movement seen in LLE-    PRIOR EXAMS: Skin: Groin incision site looks okay, bruising noted bilateral shins 2 IVs in place left upper extremity  looks okay Extremities: unable to palpate L radial pulse but dopplers confirm pulse with rate in 60-70s, arm well perfused appearing, warm and pink, motor function preserved. Brachial pulse also unable to be felt. R radial pulse strong and palpable. Neuro:   Mental Status: AAOx3, memory intact,  Speech/Languate:  fluent, follows simple commands CRANIAL NERVES: II: PERRL. Visual fields full III, IV, VI: EOM intact, no gaze preference or deviation V: normal sensation bilaterally VII: no asymmetry VIII: normal hearing to speech IX, X: normal palatal elevation XI: 5/5  head turn and 5/5 shoulder shrug bilaterally XII: Tongue midline     MOTOR: RUE: 5/5 Deltoid, 5/5 Biceps, 5/5 Triceps,5/5 Grip LUE: 5/5 Deltoid, 5/5 Biceps, 5/5 Triceps, 5/5 Grip RLE: 0 out of 5 throughout LLE: 0 out of 5 throughout   SENSORY: Normal to touch all 4 extremities to light touch in bilateral upper and lower extremities, patchy altered sensation to cold in lower extremities   No ankle clonus bilaterally   Coordination: Normal finger to nose, no dysmetria  Assessment/Plan: 1. Functional deficits which require 3+ hours per day of interdisciplinary therapy in a comprehensive inpatient rehab setting. Physiatrist is providing close team supervision and 24 hour management of active medical problems listed below. Physiatrist and rehab team continue to assess barriers to discharge/monitor patient progress toward functional and medical goals  Care Tool:  Bathing    Body parts bathed by patient: Right arm, Left arm, Chest, Abdomen, Front perineal area, Face, Right upper leg, Left upper leg   Body parts bathed by helper: Right lower leg, Left lower leg, Buttocks     Bathing assist Assist Level: Minimal Assistance - Patient > 75% (sitting in roll in shower chair)     Upper Body Dressing/Undressing Upper body dressing   What is the patient wearing?: Pull over shirt    Upper body assist Assist Level: Minimal Assistance - Patient > 75%    Lower Body Dressing/Undressing Lower body dressing      What is the patient wearing?: Incontinence brief, Pants     Lower body assist Assist for lower body dressing: Total Assistance - Patient < 25%     Toileting Toileting    Toileting assist Assist for toileting: Total Assistance - Patient < 25%     Transfers Chair/bed transfer  Transfers assist  Chair/bed transfer activity did not occur: Safety/medical concerns  Chair/bed transfer assist level: Total Assistance - Patient < 25% (squat pivot and SB)      Locomotion Ambulation   Ambulation assist   Ambulation activity did not occur: Safety/medical concerns (paraplegia, fatigue)          Walk 10 feet activity   Assist  Walk 10 feet activity did not occur: Safety/medical concerns (paraplegia, fatigue)        Walk 50 feet activity   Assist Walk 50 feet with 2 turns activity did not occur: Safety/medical concerns (paraplegia, fatigue)         Walk 150 feet activity   Assist Walk 150 feet activity did not occur: Safety/medical concerns (paraplegia, fatigue)         Walk 10 feet on uneven surface  activity   Assist Walk 10 feet on uneven surfaces activity did not occur: Safety/medical concerns (paraplegia, fatigue)         Wheelchair     Assist Is the patient using a wheelchair?: Yes Type of Wheelchair: Manual Wheelchair activity did not occur: Safety/medical concerns (paraplegia, fatigue)  Wheelchair 50 feet with 2 turns activity    Assist    Wheelchair 50 feet with 2 turns activity did not occur: Safety/medical concerns (paraplegia, fatigue)       Wheelchair 150 feet activity     Assist  Wheelchair 150 feet activity did not occur: Safety/medical concerns (paraplegia, fatigue)       Blood pressure (!) 116/55, pulse (!) 54, temperature 98.1 F (36.7 C), resp. rate 17, height 5' (1.524 m), weight 50.2 kg, SpO2 99%.  Medical Problem List and Plan: 1. 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  Antithrombotics: -DVT/anticoagulation:  Pharmaceutical: Eliquis 2.5mg  BID             -antiplatelet therapy: Aspirin 81 mg daily   3. Pain Management: Tylenol, robaxin, oxycodone as needed 10/24- will wait to try Tramadol for  pain/discomfort til have tried  Remeron 7.5mg  for sleep 10/28- pt doesn't want Remeron, but has been getting it- I don't think she knows if getting or not 4. Mood/Behavior/Sleep: LCSW to evaluate and provide emotional support             -continue paroxetine 20 mg daily (hx of depression)             -antipsychotic agents: n/a -12/13/22 hasn't been sleeping well, increase melatonin to 10mg  QHS, added Trazodone 25mg  PRN but advised to be cautious about using it too late.  10/21- will stop Trazodone- can impair ability to pee- will start Restoril 7.5 mg at bedtime for sleep.  10/22- cannot start Remeron due to risk of torsades de pointes- will con't Restoril  10/23- didn't sleep well again- will increase Restoril to 15 mg at bedtime 10/24- spoke to Cards- they are ok trying Remeron but wait on Tramadol-will start 15 mg at bedtime -will stop Restoril>> down to 7.5mg  -12/19/22 slept "wonderfully"! Cont regimen 10/28- Slept poorly- still on rmeron- sounds like every other night she slept well 5. Neuropsych/cognition: This patient is capable of making decisions on her own behalf.   6. Skin/Wound Care: Routine skin care checks   7. Fluids/Electrolytes/Nutrition: Routine Is and Os and follow-up chemistries   8: Hypertension: monitor TID and prn (hx of a.fib see meds below). Monitor with mobility. Continue metoprolol 25mg  BID  -12/13/22 BPs a little up, monitor for trend 10/21- BP's running high up to 180s- systolic- but had documented BP 87/64 this AM- will wait to titrate BP meds. 10/22- BP still in 150s- but no LOW BP's this AM- will see how does in therapy- 10/23- reduced Metoprolol per pt request to 12.5 mg BID- might need Norvasc for BP control without affecting HR  10/24- will add Norvasc 2.5 mg daily since BP elevated and not coming down  10/25- calling IM- maybe they can help- just added Norvasc yesterday -12/19/22 BPs improving a little this morning, monitor -12/20/22 BPs still a tad  elevated but stable, monitor a couple more days before deciding on changes for now 10/28- BP on low side, but Hb low- will transfuse 2 units 10/29- BP looking more labile- but better than it was- monitor for  now Vitals:   12/21/22 0857 12/21/22 1022 12/21/22 1037 12/21/22 1115  BP: (!) 140/54 (!) 126/46 (!) 147/52 (!) 137/52   12/21/22 1248 12/21/22 1507 12/21/22 1612 12/21/22 1643  BP: (!) 170/58 (!) 156/58 (!) 168/56 (!) 170/62   12/21/22 1644 12/21/22 1952 12/21/22 2037 12/22/22 0509  BP: (!) 170/62 (!) 141/51 (!) 163/95 (!) 116/55                 9: Hyperlipidemia: continue pravastatin 40mg  daily   10: COPD, O2 dependent 2L via Joseph:             -continue Brovana nebs BID             -continue Yupelri neb daily   10/21- Has been on 2L O2 at home per pt and chart  10/25- ABG shows CO2 too low- needs to reduce O2 levels 11: CAD: stable on asa and statin   12: Atrial fibrillation, paroxysmal: maintained on Eliquis reversed pre-operatively             -continue amiodarone 100 mg daily             -continue metoprolol tartrate 25 mg BID             -cleared by [facility] to re-start Eliquis>pharmacy consult placed   10/25- reduced Metoprolol to 12.5 mg BID earlier in week - doing better 13: 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 10/22- not voiding -U/A (-)  will add Flomax 0.4 mg q supper to see if she  can void- I don't think it's likely. D/w nursing due to low amount of caths-  10/23- pt reports still getting cathed- is better 10/24- pt swears drinking 6 cups/water day, however BUN up to 27 and Cr 1.11- and cath volumes very low- not clear if having incontinence of bladder 10/25- pt said incontinent of bladder as well as cath overnight- monitor -12/19/22 U/A overnight looks infectious, will start Bactrim DS BID x5d, UCx pending; monitor -12/20/22 UCx with >100k CFU of K. Pneumoniae, sensitive to bactrim, cont abx course 10/29- Being cathed- volumes low- pushing fluids, but will also give 1 L IVFs 17: ABLA: stable; follow CBC trend             -consider oral iron supplement -12/13/22 Hgb slowly downtrending from postop 8.8 on 10/14>7.6>7.5>7.3>7.0 yesterday; pt asymptomatic at this time; would recheck tomorrow but if still at the 7.0-range then would consider transfusion to help with therapy tolerance; unclear what her prior baseline was (has hgb 12.2 back in July but then down to 9's).    10/21- Hb 7.4 today  10/23- Hb 7.2 yesterday- will check in AM 10/24- Hb down to 7.0- will d/w pt if she wants to be transfused. If so, will give 1 unit pRBCs.  Doesn't have location of  losing blood- will check FOBT x3  10/25- Hb 8.1- likely too dry  10/28- Hb 6.7- will transfuse 2 units  10/29- Hb much better AND pt feels MUCH better per pt- Hb up to 9.9- likely so high since pt really dry- will also rewrite for FOBT- hasn't been done 18: AKI: improving; CMP 12/12/22 showing Cr 1.31; monitor Monday BMPs 10/21- Cr 1.09 down from 1.31 and BUN 25 down from 25- however is drinking poorly and has poor output- 10/23- Says drinking better- will check BMP in AM  10/25- Cr 0.9 and BUN slightly high at 22- but based on Hb increase, likely dry 10/28- Cr 1.4 and BUN elevated- will give blood and recheck BMP in AM 10/29- BUN up to 40 and Cr 1.41- will need to give IVFs- tried seeing if blood would help, but still needs  fluids- will her BUN/Cr- will replete 75 cc/hour after therapy today and recheck BMP in AM- of note, her Cr was running ~ 1.0 and her BUN ~ 25- so big change- will push fluids as well- will give 75cc/hour for 1 liter  19: 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  10/22- continue to refuse bowel program-in spite of education of bowel program and need for it.  10/23- pt did last night- had BM with bowel program and one this AM, but educated can take 3-6 weeks to train gut, but should be better- had bowel incontinence for 20 years 10/24- no bowel program documented  but had small BM's at midnight and 5am- said wasn't incontinent?will check on this 10/25- asked pt to get more dig stim this evening since likely cause of bowel incontinent in AM -12/20/22 reports BM this morning but no BM since 12/17/22 documented, no report to nursing staff given; monitor closely but if no BM by tomorrow may need to consider additional adjustments 10/29- LBM yesterday AM, and small with bowel program 10/27-no results last night- if no BM tonight, will give sorbitol/check KUB  20. Poor Appetite 10/22- will add Megace for poor appetite since cannot add Remeron and too sedated to add Periactin. Will also order push fluids since not drinking great- although her BUN?Cr looking better- BUN 25 and Cr 1.08-  10/23- pt feels she's drinking 6+ cups/day- ate muffin and some eggs this AM- better than last 2 days-  10/24- 25% of tray at most- will add Remeron 15 mg at bedtime- ok'd by Cards.  10/25- changed remeron to 7.5 mg QHS  21. Prolonged QT interval 10/24- spoke with Cards- they suggested starting Remeron 15 mg at bedtime and then rechecking EKG ina  few days to see if can add Tramadol. 10/29- doing ok with remeron- wait to add tramadol for now    22. Oversedation/lethargy/ UTI 10/25- septis and ABG work up- when speaking with PA- appeared to have ST elevation- asked them to call IM consult-  -12/19/22 sleepy but arousable today, U/A+, started bactrim as above, awaiting UCx; CT head neg last night, CBC/BMP stable, TSH/T4 WNL, CXR stable; suspect UTI as source. Monitor. See above #16   23. L radial pulse nonpalpable but dopplerable, BPs different in that arm-- unclear when this started, had some pain in L deltoid area yesterday but seemed to be  better today. Suspect this pulse/BP discrepancy is 2/2 her vascular issue already, but reached out to vascular surgery to verify. Dr. Karin Lieu returned page, stated this is known since her surgery; nothing to do or be concerned about.   24. Pulsatile abdominal aorta: not noticed previously, not mentioned in notes, but today 12/20/22 I noticed it was very prominently pulsatile and pt reported it being sore in that area; spoke with Dr. Karin Lieu again of [facility], recommended getting CTA Abd/pelv to evaluate for AAA. Will await results.     I spent a total of 51  minutes on total care today- >50% coordination of care- due to  D/w pt about blood; MS exam- as well as complex medical decision making from AKI and spoke with PA about giving IVFs due to shortage-  Will also do flyer- and spoke with nursing about IVFs-  Also team conference to f/u on progress  LOS: 10 days A FACE TO FACE EVALUATION WAS PERFORMED  Rabia Argote 12/22/2022, 8:48 AM

## 2022-12-22 NOTE — Progress Notes (Signed)
Late entry: Patient was admitted on 12/12/22 with a DTI to the sacrum. WOC nurse assessed the patient on 12/14/22 & charted a left heel stage 2, right heel stage 2 & a left ischium stage 2 in addition. These were not noted on admission but were not listed in LDA. A nurse added a LDA on 12/20/22 to those same areas. The LDA was corrected as per the WOC nurses note on 12/14/22. Marland Kitchen

## 2022-12-23 LAB — BASIC METABOLIC PANEL
Anion gap: 13 (ref 5–15)
BUN: 45 mg/dL — ABNORMAL HIGH (ref 8–23)
CO2: 21 mmol/L — ABNORMAL LOW (ref 22–32)
Calcium: 8.3 mg/dL — ABNORMAL LOW (ref 8.9–10.3)
Chloride: 102 mmol/L (ref 98–111)
Creatinine, Ser: 1.42 mg/dL — ABNORMAL HIGH (ref 0.44–1.00)
GFR, Estimated: 37 mL/min — ABNORMAL LOW (ref >60)
Glucose, Bld: 83 mg/dL (ref 70–99)
Potassium: 4.9 mmol/L (ref 3.5–5.1)
Sodium: 136 mmol/L (ref 135–145)

## 2022-12-23 MED ORDER — SENNA 8.6 MG PO TABS: 2.0000 | ORAL_TABLET | Freq: Every day | ORAL | Status: DC

## 2022-12-23 MED ORDER — SENNA 8.6 MG PO TABS
1.0000 | ORAL_TABLET | Freq: Every day | ORAL | Status: DC
  Administered 2022-12-23 – 2023-01-12 (×20): 8.6 mg via ORAL
  Filled 2022-12-23 (×20): qty 1

## 2022-12-23 MED ORDER — SORBITOL 70 % SOLN
30.0000 mL | Freq: Once | Status: AC
Start: 2022-12-23 — End: 2022-12-23
  Administered 2022-12-23: 30 mL via ORAL
  Filled 2022-12-23: qty 30

## 2022-12-23 NOTE — Plan of Care (Signed)
  Problem: RH Wheelchair Mobility Goal: LTG Patient will propel w/c in home environment (PT) Description: LTG: Patient will propel wheelchair in home environment, # of feet with assistance (PT). Flowsheets (Taken 12/23/2022 1228) LTG: Pt will propel w/c in home environ  assist needed:: Independent with assistive device Note: Via [facility] Goal: LTG Patient will propel w/c in community environment (PT) Description: LTG: Patient will propel wheelchair in community environment, # of feet with assist (PT) Flowsheets (Taken 12/23/2022 1228) LTG: Pt will propel w/c in community environ  assist needed:: Supervision/Verbal cueing Note: Via [facility]

## 2022-12-23 NOTE — Progress Notes (Signed)
Physical Therapy Weekly Progress Note  Patient Details  Name: Heather Molina MRN: [id number] Date of Birth: 1940-07-14  Beginning of progress report period: December 13, 2022 End of progress report period: December 23, 2022  Today's Date: 12/23/2022 PT Individual Time: 1020-1105 PT Individual Time Calculation (min): 45 min   Patient has met 1 of 4 short term goals.  Pt making fair progress towards functional goals but has been limited by fatigue. Pt plan of care has been updated to reflect this and pt currently 15/7. Pt completes bed mobility with max assist with leg loops, completes slideboard transfer with max assist. Pt requires close supervision/min assist for WC mobility in PWC. Pt is scheduled to have WC evaluation on 11/5 for personal PWC and scheduled to have family meeting with care partners prior to DC.  Patient continues to demonstrate the following deficits muscle weakness, decreased cardiorespiratoy endurance and decreased oxygen support, unbalanced muscle activation, and decreased sitting balance, decreased postural control, and decreased balance strategies and therefore will continue to benefit from skilled PT intervention to increase functional independence with mobility.  Patient progressing toward long term goals..  Plan of care revisions: WC goals updated to reflect pt using PWC at DC.  PT Short Term Goals Week 1:  PT Short Term Goal 1 (Week 1): Pt will require mod A with rolling PT Short Term Goal 1 - Progress (Week 1): Progressing toward goal PT Short Term Goal 2 (Week 1): Pt will require mod A with supine <>sit PT Short Term Goal 2 - Progress (Week 1): Progressing toward goal PT Short Term Goal 3 (Week 1): Pt will require mod A for static sitting balance x 1 minute PT Short Term Goal 3 - Progress (Week 1): Met PT Short Term Goal 4 (Week 1): Pt will require mod A for slide board transfer from bed<>chair PT Short Term Goal 4 - Progress (Week 1): Progressing toward goal Week  2:  PT Short Term Goal 1 (Week 2): Pt will complete WC mobility in PWC with supervision 150' navigating busy hallways PT Short Term Goal 2 (Week 2): Pt will require mod assist for supine>sit PT Short Term Goal 3 (Week 2): Pt will tolerate sitting balance on firm surface CGA x1 min PT Short Term Goal 4 (Week 2): Pt will complete SB transfer with mod assist  Skilled Therapeutic Interventions/Progress Updates:  Pt presents in room in bed, motivated to participate with PT. Pt denies pain at this time. Session focused on therapeutic activities for bed mobility and transfer training as well as WC mobility training in PWC.  Therapist dons leg loops total assist. Pt completes bed mobility min assist for managing BLEs off EOB, max assist for trunk to upright with elevated HOB and hospital bed rails. Pt requires max assist for sitting balance initially with pt demonstrating posterior trunk lean and R lateral lean, improves to mod/min assist with cues for leaning to left and forward. Therapist places slideboard and 4" step beneath feet due to height of bed, pt completes SB transfer with max assist and verbal cues for head/hips relationship, hand placement and foot placement throughout transfer. Pt able to manage hips posteriorly in [facility] with verbal cues and mod assist for weightshifting.  Therapist provides assist for managing PWC in room, then pt self propels PWC with supervision/min assist for obstacle negotiation in busy hallways to day room. Pt completes WC mobility in figure 8 x6 trials around upright bolster and chair with supervision/one instance min assist for backing up  and mod/ max verbal cues for sequencing and WC management.  Pt completes WC mobility back to room with supervision/ x2 instance min assist for managing PWC in busy hallway and therapist with total assist for managing PWC positioning next to bed for SB transfer. Pt completes SB transfer with assist x2 for slideboard positioning and trunk  stability. Pt requires max assist for sit to supine, pt able to manage holding leg loops for BLEs into bed but requires max assist for positioning trunk and BLEs once in supine. Pt repositions self towards HOB with BUEs on hospital bed rails. Pt remains supine with pillows positioned beneath BLEs to float heels and under BUEs for comfort, call light in place, all needs within reach at end of session.  Discharge planning;DME/adaptive equipment instruction;Functional mobility training;Psychosocial support;Splinting/orthotics;Therapeutic Activities;UE/LE Strength taining/ROM;Wheelchair propulsion/positioning;UE/LE Coordination activities;Therapeutic Exercise;Skin care/wound management;Patient/family education;Neuromuscular re-education;Functional electrical stimulation;Disease management/prevention;Community reintegration;Balance/vestibular training   Therapy Documentation Precautions:  Precautions Precautions: Fall Precaution Comments: paraplegia Restrictions Weight Bearing Restrictions: No  Therapy/Group: Individual Therapy  Edwin Cap PT, DPT 12/23/2022, 12:19 PM

## 2022-12-23 NOTE — Progress Notes (Signed)
Occupational Therapy Session Note  Patient Details  Name: Heather Molina MRN: [id number] Date of Birth: 24-Aug-1940  Today's Date: 12/23/2022 OT Individual Time: 4696-2952 & 1300-1345 OT Individual Time Calculation (min): 45 min & 45 min    Short Term Goals: Week 2:  OT Short Term Goal 1 (Week 2): Pt will complete commode transfer Max A with transfer board OT Short Term Goal 2 (Week 2): Pt will complete pressure relief without cues 25% of time throughout sessions  Skilled Therapeutic Interventions/Progress Updates:      Therapy Documentation Precautions:  Precautions Precautions: Fall Precaution Comments: paraplegia Restrictions Weight Bearing Restrictions: No Session 1 General: "Hi honey!" Pt supine in bed upon OT arrival, agreeable to OT session.  Pain: no pain reported  ADL: Bed mobility: Mod A bed mobility using bed rails rolling Lt and Rt LB dressing: Max A emerging Mod A, OT assisted pt with managing LE into figure 4 Footwear: total A bed level for TEDs and shoes  Other Treatments: OT provided LE stretching in order to maintain muscle integrity for ADLs in order to complete figure 4 for pants management. OT donned band aids 2 places on anterior calves d/t skin integrity. Nurse notified.    Pt supine in bed with bed alarm activated, 2 bed rails up, call light within reach and 4Ps assessed.   Session 2 General: "I always look forward to you coming in here." Pt supine in bed upon OT arrival, agreeable to OT session.  Pain: no pain reported  ADL: Pt seated on EOB completing static/dynamic balance with wooden box under feet for support. Pt completed static sitting in order to increase trunk support. Pt able to practice supported sitting in propped position and seated with hands in lap. CGA once seated with UE support. Pt practicing descending to bent elbow and pushing back into extension on bilateral sides seated EOB in order to increase independence with bed mobility. Min  A for task.   Pt supine in bed with bed alarm activated, 2 bed rails up, call light within reach and 4Ps assessed.   Therapy/Group: Individual Therapy  [name], OTD, OTR/L 12/23/2022, 8:58 AM

## 2022-12-23 NOTE — Progress Notes (Signed)
IP Rehab Bowel Program Documentation   Bowel Program Start time 2205  Dig Stim Indicated? Yes  Dig Stim Prior to Suppository or mini Enema X 1   Output from dig stim: Very large  Ordered intervention: Suppository Yes , mini enema No ,   Repeat dig stim after Suppository or Mini enema  X 3,  Output? Very large   Bowel Program Complete? Yes , handoff given   Patient Tolerated? Yes

## 2022-12-23 NOTE — Progress Notes (Signed)
PROGRESS NOTE   Subjective/Complaints:  Pt reports slept much better- did great.  Didn't order anything but coffee and OJ for breakfast- we discussed using muffin so can take meds.   Hasn't had BM in 3 days. Per nursing coordinator- and nursing.   ROS:    Pt denies SOB, abd pain, CP, N/V/ (denies feeling constipated) C/D, and vision changes   Except for HPI  Objective:   No results found. Recent Labs    12/21/22 0754 12/22/22 0522  WBC 7.3 8.7  HGB 6.7* 9.9*  HCT 22.2* 31.0*  PLT 365 266   Recent Labs    12/22/22 0522 12/23/22 0532  NA 134* 136  K 4.9 4.9  CL 101 102  CO2 19* 21*  GLUCOSE 92 83  BUN 40* 45*  CREATININE 1.41* 1.42*  CALCIUM 8.4* 8.3*    Urinalysis    Component Value Date/Time   COLORURINE YELLOW 12/18/2022 1854   APPEARANCEUR HAZY (A) 12/18/2022 1854   LABSPEC 1.008 12/18/2022 1854   PHURINE 7.0 12/18/2022 1854   GLUCOSEU NEGATIVE 12/18/2022 1854   HGBUR SMALL (A) 12/18/2022 1854   BILIRUBINUR NEGATIVE 12/18/2022 1854   KETONESUR NEGATIVE 12/18/2022 1854   PROTEINUR NEGATIVE 12/18/2022 1854   NITRITE NEGATIVE 12/18/2022 1854   LEUKOCYTESUR MODERATE (A) 12/18/2022 1854      Intake/Output Summary (Last 24 hours) at 12/23/2022 1050 Last data filed at 12/23/2022 0603 Gross per 24 hour  Intake --  Output 650 ml  Net -650 ml     Pressure Injury 12/12/22 Sacrum Medial Deep Tissue Pressure Injury - Purple or maroon localized area of discolored intact skin or blood-filled blister due to damage of underlying soft tissue from pressure and/or shear. (Active)  12/12/22 1844  Location: Sacrum  Location Orientation: Medial  Staging: Deep Tissue Pressure Injury - Purple or maroon localized area of discolored intact skin or blood-filled blister due to damage of underlying soft tissue from pressure and/or shear.  Wound Description (Comments):   Present on Admission: Yes     Pressure  Injury 12/14/22 Heel Left;Lateral;Posterior Stage 2 -  Partial thickness loss of dermis presenting as a shallow open injury with a red, pink wound bed without slough. (Active)  12/14/22 (first assessed by [facility]) 1221  Location: Heel  Location Orientation: Left;Lateral;Posterior  Staging: Stage 2 -  Partial thickness loss of dermis presenting as a shallow open injury with a red, pink wound bed without slough.  Wound Description (Comments):   Present on Admission: No     Pressure Injury 12/14/22 Heel Right;Lateral Stage 2 -  Partial thickness loss of dermis presenting as a shallow open injury with a red, pink wound bed without slough. (Active)  12/14/22 (first assessed by [facility]) 1221  Location: Heel  Location Orientation: Right;Lateral  Staging: Stage 2 -  Partial thickness loss of dermis presenting as a shallow open injury with a red, pink wound bed without slough.  Wound Description (Comments):   Present on Admission: No     Pressure Injury 12/14/22 Ischial tuberosity Left;Medial Stage 2 -  Partial thickness loss of dermis presenting as a shallow open injury with a red, pink wound bed without slough. blister (Active)  12/14/22 (first assessed by [facility] nurse) 1221  Location: Ischial tuberosity  Location Orientation: Left;Medial  Staging: Stage 2 -  Partial thickness loss of dermis presenting as a shallow open injury with a red, pink wound bed without slough.  Wound Description (Comments): blister  Present on Admission: No     Physical Exam: Vital Signs Blood pressure (!) 129/48, pulse (!) 54, temperature 98 F (36.7 C), resp. rate 16, height 5' (1.524 m), weight 50.2 kg, SpO2 100%.      General: awake, alert, appropriate, after woke her up- brighter affect;  NAD HENT: conjugate gaze; oropharynx moist CV: regular rate- in afib; no JVD Pulmonary: CTA B/L; no W/R/R- on O2 by [facility]: soft,  but distended- hypoactive BS Psychiatric: appropriate- more bright affect Neurological: Ox3  MS;  was able to move RLE< but from abd muscles- not in LE muscles- no movement seen in LLE either-    PRIOR EXAMS: Skin: Groin incision site looks okay, bruising noted bilateral shins 2 IVs in place left upper extremity looks okay Extremities: unable to palpate L radial pulse but dopplers confirm pulse with rate in 60-70s, arm well perfused appearing, warm and pink, motor function preserved. Brachial pulse also unable to be felt. R radial pulse strong and palpable. Neuro:   Mental Status: AAOx3, memory intact,  Speech/Languate:  fluent, follows simple commands CRANIAL NERVES: II: PERRL. Visual fields full III, IV, VI: EOM intact, no gaze preference or deviation V: normal sensation bilaterally VII: no asymmetry VIII: normal hearing to speech IX, X: normal palatal elevation XI: 5/5 head turn and 5/5 shoulder shrug bilaterally XII: Tongue midline     MOTOR: RUE: 5/5 Deltoid, 5/5 Biceps, 5/5 Triceps,5/5 Grip LUE: 5/5 Deltoid, 5/5 Biceps, 5/5 Triceps, 5/5 Grip RLE: 0 out of 5 throughout LLE: 0 out of 5 throughout   SENSORY: Normal to touch all 4 extremities to light touch in bilateral upper and lower extremities, patchy altered sensation to cold in lower extremities   No ankle clonus bilaterally   Coordination: Normal finger to nose, no dysmetria  Assessment/Plan: 1. Functional deficits which require 3+ hours per day of interdisciplinary therapy in a comprehensive inpatient rehab setting. Physiatrist is providing close team supervision and 24 hour management of active medical problems listed below. Physiatrist and rehab team continue to assess barriers to discharge/monitor patient progress toward functional and medical goals  Care Tool:  Bathing    Body parts bathed by patient: Right arm, Left arm, Chest, Abdomen, Front perineal area, Face, Right upper leg, Left upper leg   Body parts bathed by helper: Right lower leg, Left lower leg, Buttocks     Bathing assist Assist Level:  Minimal Assistance - Patient > 75% (sitting in roll in shower chair)     Upper Body Dressing/Undressing Upper body dressing   What is the patient wearing?: Pull over shirt    Upper body assist Assist Level: Minimal Assistance - Patient > 75%    Lower Body Dressing/Undressing Lower body dressing      What is the patient wearing?: Incontinence brief, Pants     Lower body assist Assist for lower body dressing: Total Assistance - Patient < 25%     Toileting Toileting    Toileting assist Assist for toileting: Total Assistance - Patient < 25%     Transfers Chair/bed transfer  Transfers assist  Chair/bed transfer activity did not occur: Safety/medical concerns  Chair/bed transfer assist level: Maximal Assistance - Patient 25 - 49%  Locomotion Ambulation   Ambulation assist   Ambulation activity did not occur: Safety/medical concerns (paraplegia, fatigue)          Walk 10 feet activity   Assist  Walk 10 feet activity did not occur: Safety/medical concerns (paraplegia, fatigue)        Walk 50 feet activity   Assist Walk 50 feet with 2 turns activity did not occur: Safety/medical concerns (paraplegia, fatigue)         Walk 150 feet activity   Assist Walk 150 feet activity did not occur: Safety/medical concerns (paraplegia, fatigue)         Walk 10 feet on uneven surface  activity   Assist Walk 10 feet on uneven surfaces activity did not occur: Safety/medical concerns (paraplegia, fatigue)         Wheelchair     Assist Is the patient using a wheelchair?: Yes Type of Wheelchair: Power Wheelchair activity did not occur: Safety/medical concerns (paraplegia, fatigue)  Wheelchair assist level: Supervision/Verbal cueing Max wheelchair distance: 150    Wheelchair 50 feet with 2 turns activity    Assist    Wheelchair 50 feet with 2 turns activity did not occur: Safety/medical concerns (paraplegia, fatigue)   Assist Level:  Supervision/Verbal cueing   Wheelchair 150 feet activity     Assist  Wheelchair 150 feet activity did not occur: Safety/medical concerns (paraplegia, fatigue)   Assist Level: Supervision/Verbal cueing   Blood pressure (!) 129/48, pulse (!) 54, temperature 98 F (36.7 C), resp. rate 16, height 5' (1.524 m), weight 50.2 kg, SpO2 100%.  Medical Problem List and Plan: 1. 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  Antithrombotics: -DVT/anticoagulation:  Pharmaceutical: Eliquis 2.5mg  BID             -antiplatelet therapy: Aspirin 81 mg daily   3. Pain Management: Tylenol, robaxin, oxycodone as needed 10/24- will wait to try Tramadol for pain/discomfort til have tried  Remeron 7.5mg  for sleep 10/28- pt doesn't want Remeron, but has been getting it- I don't think she knows if getting or not 10/30- will con't since every other night, sleeping well 4. Mood/Behavior/Sleep: LCSW to evaluate and provide emotional support             -continue paroxetine 20 mg daily (hx of depression)             -antipsychotic agents: n/a -12/13/22 hasn't been sleeping well, increase melatonin to 10mg  QHS, added Trazodone 25mg  PRN but advised to be cautious about using it too late.  10/21- will stop Trazodone- can impair ability to pee- will start Restoril 7.5 mg at bedtime for sleep.  10/22- cannot start Remeron due to risk of torsades de pointes- will con't Restoril  10/23- didn't sleep well again- will increase Restoril to 15 mg at bedtime 10/24- spoke to Cards- they are ok trying Remeron but wait on Tramadol-will start 15 mg at bedtime -will stop Restoril>> down to 7.5mg  -12/19/22 slept "wonderfully"! Cont regimen 10/28- Slept poorly- still on rmeron- sounds like every other night she slept well 10/30-  sleeping better 5. Neuropsych/cognition: This patient is capable of making decisions on her own  behalf.   6. Skin/Wound Care: Routine skin care checks   7. Fluids/Electrolytes/Nutrition: Routine Is and Os and follow-up chemistries   8: Hypertension: monitor TID and prn (hx of a.fib see meds below). Monitor with mobility. Continue metoprolol 25mg  BID  -12/13/22 BPs a little up, monitor for trend 10/21- BP's running high up to 180s- systolic- but had documented BP 87/64 this AM- will wait to titrate BP meds. 10/22- BP still in 150s- but no LOW BP's this AM- will see how does in therapy- 10/23- reduced Metoprolol per pt request to 12.5 mg BID- might need Norvasc for BP control without affecting HR  10/24- will add Norvasc 2.5 mg daily since BP elevated and not coming down  10/25- calling IM- maybe they can help- just added Norvasc yesterday -12/19/22 BPs improving a little this morning, monitor -12/20/22 BPs still a tad elevated but stable, monitor a couple more days before deciding on changes for now 10/28- BP on low side, but Hb low- will transfuse 2 units 10/29- BP looking more labile- but better than it was- monitor for now Vitals:   12/21/22 1037 12/21/22 1115 12/21/22 1248 12/21/22 1507  BP: (!) 147/52 (!) 137/52 (!) 170/58 (!) 156/58   12/21/22 1612 12/21/22 1643 12/21/22 1644 12/21/22 1952  BP: (!) 168/56 (!) 170/62 (!) 170/62 (!) 141/51   12/21/22 2037 12/22/22 0509 12/22/22 1942 12/23/22 0401  BP: (!) 163/95 (!) 116/55 (!) 117/51 (!) 129/48                 9: Hyperlipidemia: continue pravastatin 40mg  daily   10: COPD, O2 dependent 2L via [place]:             -continue Brovana nebs BID             -continue Yupelri neb daily   10/21- Has been on 2L O2 at home per pt and chart  10/25- ABG shows CO2 too low- needs to reduce O2 levels 11: CAD: stable on asa and statin   12: Atrial fibrillation, paroxysmal: maintained on Eliquis reversed pre-operatively             -continue  amiodarone 100 mg daily             -continue metoprolol tartrate 25 mg BID             -cleared by [facility] to re-start Eliquis>pharmacy consult placed   10/25- reduced Metoprolol to 12.5 mg BID earlier in week - doing better  10/30- BP doing better 13: 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 10/22- not voiding -U/A (-)  will add Flomax 0.4 mg q supper to see if she can void- I don't think it's likely. D/w nursing due to low amount of caths-  10/23- pt reports still getting cathed- is better 10/24- pt swears drinking  6 cups/water day, however BUN up to 27 and Cr 1.11- and cath volumes very low- not clear if having incontinence of bladder 10/25- pt said incontinent of bladder as well as cath overnight- monitor -12/19/22 U/A overnight looks infectious, will start Bactrim DS BID x5d, UCx pending; monitor -12/20/22 UCx with >100k CFU of K. Pneumoniae, sensitive to bactrim, cont abx course 10/29- Being cathed- volumes low- pushing fluids, but will also give 1 L IVFs 10/30- not drinking- Got IVFs but BUN up to 45 and Cr 1.41- will call renal- and recheck in AM 17: ABLA: stable; follow CBC trend             -consider oral iron supplement -12/13/22 Hgb slowly downtrending from postop 8.8 on 10/14>7.6>7.5>7.3>7.0 yesterday; pt asymptomatic at this time; would recheck tomorrow but if still at the 7.0-range then would  consider transfusion to help with therapy tolerance; unclear what her prior baseline was (has hgb 12.2 back in July but then down to 9's).    10/21- Hb 7.4 today  10/23- Hb 7.2 yesterday- will check in AM 10/24- Hb down to 7.0- will d/w pt if she wants to be transfused. If so, will give 1 unit pRBCs.  Doesn't have location of losing blood- will check FOBT x3  10/25- Hb 8.1- likely too dry  10/28- Hb 6.7- will transfuse 2 units  10/29- Hb much better AND pt feels MUCH better per pt- Hb up to 9.9- likely so high since pt really dry- will also rewrite for FOBT- hasn't been done  10/30- no bowel results- so cannot check FOBT yet.  18: AKI: improving; CMP 12/12/22 showing Cr 1.31; monitor Monday BMPs 10/21- Cr 1.09 down from 1.31 and BUN 25 down from 25- however is drinking poorly and has poor output- 10/23- Says drinking better- will check BMP in AM  10/25- Cr 0.9 and BUN slightly high at 22- but based on Hb increase, likely dry 10/28- Cr 1.4 and BUN elevated- will give blood and recheck BMP in AM 10/29- BUN up to 40 and Cr 1.41- will need to give IVFs- tried seeing if blood would help, but still needs fluids- will her BUN/Cr- will replete 75 cc/hour after therapy today and recheck BMP in AM- of note, her Cr was running ~ 1.0 and her BUN ~ 25- so big change- will push fluids as well- will give 75cc/hour for 1 liter  19: 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  10/22- continue to refuse bowel program-in spite of education of bowel program and need for it.  10/23- pt did last night- had BM with bowel program and one this AM, but educated can take 3-6 weeks to train gut, but should be better- had bowel incontinence for 20  years 10/24- no bowel program documented  but had small BM's at midnight and 5am- said wasn't incontinent?will check on this 10/25- asked pt to get more dig stim this evening since likely cause of bowel incontinent in AM -12/20/22 reports BM this morning but no BM since 12/17/22 documented, no report to nursing staff given; monitor closely but if no BM by tomorrow may need to consider additional adjustments 10/29- LBM yesterday AM, and small with bowel program 10/27-no results last night- if  no BM tonight, will give sorbitol/check KUB  10/30- no significant BM for 3 days- will give Sorbitol 30cc at 2pm for bowel program- also add Senna 1 tab daily for meds- 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  20. Poor Appetite 10/22- will add Megace for poor appetite since cannot add Remeron and too sedated to add Periactin. Will also order push fluids since not drinking great- although her BUN?Cr looking better- BUN 25 and Cr 1.08-  10/23- pt feels she's drinking 6+ cups/day- ate muffin and some eggs this AM- better than last 2 days-  10/24- 25% of tray at most- will add Remeron 15 mg at bedtime- ok'd by Cards.  10/25- changed remeron to 7.5 mg QHS 10/30- won't eat anything for breakfast- small appetite overall-  21. Prolonged QT interval 10/24- spoke with Cards- they suggested starting Remeron 15 mg at bedtime and then rechecking EKG ina few days to see if can add Tramadol. 10/29- doing ok with remeron- wait to add tramadol for now    22. Oversedation/lethargy/ UTI 10/25- septis and ABG work up- when speaking with PA- appeared to have ST elevation- asked them to call IM consult-  -12/19/22 sleepy but arousable today, U/A+, started bactrim as above, awaiting UCx; CT head neg last night, CBC/BMP stable, TSH/T4 WNL, CXR stable; suspect UTI as source. Monitor. See above #16 10/30- much more awake-    23. L radial pulse nonpalpable but dopplerable, BPs different in that arm-- unclear when this  started, had some pain in L deltoid area yesterday but seemed to be better today. Suspect this pulse/BP discrepancy is 2/2 her vascular issue already, but reached out to vascular surgery to verify. Dr. Karin Lieu returned page, stated this is known since her surgery; nothing to do or be concerned about.   24. Pulsatile abdominal aorta: not noticed previously, not mentioned in notes, but today 12/20/22 I noticed it was very prominently pulsatile and pt reported it being sore in that area; spoke with Dr. Karin Lieu again of [facility], recommended getting CTA Abd/pelv to evaluate for AAA. Will await results.       I spent a total of  36  minutes on total care today- >50% coordination of care- due to  D/w nursing about lack of BM's-LBM 3+ days ago  - pt refused bowel meds since "has IBS-D" and pt- pt not eating breakfast of note- d/w nursing coordinator about issues as well. Esp bowels. Also calling renal    LOS: 11 days A FACE TO FACE EVALUATION WAS PERFORMED  Malynda Smolinski 12/23/2022, 10:50 AM

## 2022-12-23 NOTE — Progress Notes (Signed)
Patient ID: Heather Molina, female   DOB: Aug 09, 1940, 82 y.o.   MRN: [id number]  Received message from daughter she wants to talk with the MD/PA have secure messaged both of them regarding this

## 2022-12-24 LAB — BASIC METABOLIC PANEL
Anion gap: 8 (ref 5–15)
BUN: 41 mg/dL — ABNORMAL HIGH (ref 8–23)
CO2: 19 mmol/L — ABNORMAL LOW (ref 22–32)
Calcium: 8 mg/dL — ABNORMAL LOW (ref 8.9–10.3)
Chloride: 107 mmol/L (ref 98–111)
Creatinine, Ser: 1.36 mg/dL — ABNORMAL HIGH (ref 0.44–1.00)
GFR, Estimated: 39 mL/min — ABNORMAL LOW (ref >60)
Glucose, Bld: 80 mg/dL (ref 70–99)
Potassium: 4.6 mmol/L (ref 3.5–5.1)
Sodium: 134 mmol/L — ABNORMAL LOW (ref 135–145)

## 2022-12-24 MED ORDER — SULFAMETHOXAZOLE-TRIMETHOPRIM 800-160 MG PO TABS: 1.0000 | ORAL_TABLET | Freq: Two times a day (BID) | ORAL | Status: DC

## 2022-12-24 MED ORDER — ENSURE ENLIVE PO LIQD
237.0000 mL | ORAL | Status: DC
  Administered 2022-12-25 – 2023-01-07 (×4): 237 mL via ORAL

## 2022-12-24 NOTE — Discharge Instructions (Addendum)
Inpatient Rehab Discharge Instructions  Heather Molina Leisure Discharge date and time: No discharge date for patient encounter.   Activities/Precautions/ Functional Status: Activity: no lifting, driving, or strenuous exercise until cleared by MD Diet: cardiac diet Wound Care: keep wound clean and dry Functional status:  ___ No restrictions     ___ Walk up steps independently __x_ 24/7 supervision/assistance   ___ Walk up steps with assistance ___ Intermittent supervision/assistance  ___ Bathe/dress independently ___ Walk with walker     ___ Bathe/dress with assistance ___ Walk Independently    ___ Shower independently ___ Walk with assistance    __x_ Shower with assistance _x__ No alcohol     ___ Return to work/school ________  Special Instructions: No driving, alcohol consumption or tobacco use.  Skin/Wound Care  Left arm skin tear-healing, allow steri-strips to fall off on their own. Right groin incision- healing Medial sacrum- pressure relief every 20/30 min when sitting in wheelchair and turn every 2 hours when in bed.  May apply foam dressing to area and change every 3 days or as needed for soiling.  Left posterior hip- same as above.  Right/Left heel- keep elevated off surfaces to eliminate pressure.  Apply PRAFO boots when in bed.  May apply foam dressing to heels and change every 3 days or as needed for soiling.    Recommend daily BP measurement in same arm and record time of day. Bring this information with you to follow-up appointment with PCP.   COMMUNITY REFERRALS UPON DISCHARGE:    Home Health:   PT   OT    RN    AIDE                Agency: [facility]  Phone: [phone]   Medical Equipment/Items Ordered: HOSPITAL BED, DROP-ARM BEDSIDE COMMODE, TRANSFER BOARD-30                                                  Agency/Supplier:[facility]   [id number]                                                           AERO FLOW FOR CATHERTERS        My questions  have been answered and I understand these instructions. I will adhere to these goals and the provided educational materials after my discharge from the hospital.  Patient/Caregiver Signature _______________________________ Date __________  Clinician Signature _______________________________________ Date __________  Please bring this form and your medication list with you to all your follow-up doctor's appointments.   Information on my medicine - ELIQUIS (apixaban)  This medication education was reviewed with me or my healthcare representative as part of my discharge preparation.   Why was Eliquis prescribed for you? Eliquis was prescribed for you to reduce the risk of a blood clot forming that can cause a stroke if you have a medical condition called atrial fibrillation (a type of irregular heartbeat).  What do You need to know about Eliquis ? Take your Eliquis TWICE DAILY - one tablet in the morning and one tablet in the evening with or without food. If you have difficulty swallowing the tablet whole please  discuss with your pharmacist how to take the medication safely.  Take Eliquis exactly as prescribed by your doctor and DO NOT stop taking Eliquis without talking to the doctor who prescribed the medication.  Stopping may increase your risk of developing a stroke.  Refill your prescription before you run out.  After discharge, you should have regular check-up appointments with your healthcare provider that is prescribing your Eliquis.  In the future your dose may need to be changed if your kidney function or weight changes by a significant amount or as you get older.  What do you do if you miss a dose? If you miss a dose, take it as soon as you remember on the same day and resume taking twice daily.  Do not take more than one dose of ELIQUIS at the same time to make up a missed dose.  Important Safety Information A possible side effect of Eliquis is bleeding. You should call  your healthcare provider right away if you experience any of the following: Bleeding from an injury or your nose that does not stop. Unusual colored urine (red or dark brown) or unusual colored stools (red or black). Unusual bruising for unknown reasons. A serious fall or if you hit your head (even if there is no bleeding).  Some medicines may interact with Eliquis and might increase your risk of bleeding or clotting while on Eliquis. To help avoid this, consult your healthcare provider or pharmacist prior to using any new prescription or non-prescription medications, including herbals, vitamins, non-steroidal anti-inflammatory drugs (NSAIDs) and supplements.  This website has more information on Eliquis (apixaban): http://[email or web address].com/eliquis/home\

## 2022-12-24 NOTE — Plan of Care (Signed)
  Problem: Consults Goal: RH SPINAL CORD INJURY PATIENT EDUCATION Description:  See Patient Education module for education specifics.  Outcome: Progressing   Problem: SCI BOWEL ELIMINATION Goal: RH STG MANAGE BOWEL WITH ASSISTANCE Description: STG Manage Bowel with mod Assistance. Outcome: Progressing   Problem: SCI BLADDER ELIMINATION Goal: RH STG MANAGE BLADDER WITH ASSISTANCE Description: STG Manage Bladder With mod Assistance Outcome: Progressing   Problem: RH SKIN INTEGRITY Goal: RH STG SKIN FREE OF INFECTION/BREAKDOWN Description: Incisions will continue to heal and skin be free of infection/breakdown with min assist  Outcome: Progressing Goal: RH STG MAINTAIN SKIN INTEGRITY WITH ASSISTANCE Description: STG Maintain Skin Integrity With min Assistance. Outcome: Progressing Goal: RH STG ABLE TO PERFORM INCISION/WOUND CARE W/ASSISTANCE Description: STG Able To Perform Incision/Wound Care With min Assistance. Outcome: Progressing   Problem: RH SAFETY Goal: RH STG ADHERE TO SAFETY PRECAUTIONS W/ASSISTANCE/DEVICE Description: STG Adhere to Safety Precautions With cueing Assistance/Device. Outcome: Progressing Goal: RH STG DECREASED RISK OF FALL WITH ASSISTANCE Description: STG Decreased Risk of Fall With cueing Assistance. Outcome: Progressing   Problem: RH PAIN MANAGEMENT Goal: RH STG PAIN MANAGED AT OR BELOW PT'S PAIN GOAL Description: Less than 4 with PRN medications min assist  Outcome: Progressing   Problem: RH KNOWLEDGE DEFICIT SCI Goal: RH STG INCREASE KNOWLEDGE OF SELF CARE AFTER SCI Description: Patient/caregiver will be able to manage medications, bowel and bladder care, as well as skin care from nursing education and nursing handouts independently  Outcome: Progressing   Problem: Education: Goal: Knowledge of discharge needs will improve Outcome: Progressing   Problem: Clinical Measurements: Goal: Postoperative complications will be avoided or  minimized Outcome: Progressing   Problem: Respiratory: Goal: Ability to achieve and maintain a regular respiratory rate will improve Outcome: Progressing   Problem: Skin Integrity: Goal: Demonstration of wound healing without infection will improve Outcome: Progressing

## 2022-12-24 NOTE — Consult Note (Signed)
WOC Nurse Consult Note: Reason for Consult: L forearm  Wound type: full thickness skin tear  Pressure Injury POA: NA  Measurement: 11 cm x 0.5 cm x 0.1 cm skin tear Wound bed: 100% red moist  Drainage (amount, consistency, odor) minimal sanguinous  Periwound: 15 cm x 7 cm total area of ecchymosis  Dressing procedure/placement/frequency: Steri strips in place.  Clean L forearm skin tear with NS, apply Xeroform gauze to wound bed every other day, cover with Telfa nonstick dressing and Kerlix roll gauze.  Secure with Ace bandage if desired.    IF ANY PART OF DRESSING IS STUCK TO WOUND BED AT REMOVAL SOAK IN NORMAL SALINE FOR ATRAUMATIC REMOVAL.    Patient says she has frequent skin tears and had a skin tear in this area that extended to current wound.    POC discussed with patient and bedside nurse. WOC team will not follow. Re-consult if further needs arise.   Thank you,    Priscella Mann MSN, RN-BC, [employer] [phone]

## 2022-12-24 NOTE — Progress Notes (Signed)
Nutrition Follow-up  DOCUMENTATION CODES:   Not applicable  INTERVENTION:  Continue regular diet as ordered Magic cup TID with meals, each supplement provides 290 kcal and 9 grams of protein  Ensure Enlive po decreased to once daily, each supplement provides 350 kcal and 20 grams of protein. MVI with minerals daily Vitamin C 250mg  daily  Request updated measured weight  NUTRITION DIAGNOSIS:   Increased nutrient needs related to wound healing as evidenced by estimated needs. - remains applicable  GOAL:   Patient will meet greater than or equal to 90% of their needs - goal unmet, addressing via meals and nutrition supplements  MONITOR:   PO intake, Supplement acceptance, Labs, Weight trends, Skin  REASON FOR ASSESSMENT:   Consult Assessment of nutrition requirement/status, Diet education, Wound healing, Poor PO  ASSESSMENT:   82 y/o female presented to [facility] on 12/06/22 with complaint of acute chest and back pain. 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  Transferred to [facility] campus. 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 She was taken to surgery and underwent TEVAR, transferred to ICU for observation. Postop day 1 she was unable to move her feet.   PMH: acute on chronic respiratory failure with hypoxia, osteoporosis, anxiety, COPD, CAD, CAP, CVA, nicotine dependence, GERD, generalized edema, HTN, IBS with diarrhea, dyslipidemia, A-fib, current major depressive disorder, cholecystectomy, endarterectomy-femoral, thoracic aortic endovascular stent graft.  10/14-s/p TEVAR  with spinal cord ischemia  Per documentation of meal completions, PO intake appears variable. Meal completions: 10/27: 20% dinner 10/28: 100% breakfast, 10% lunch, 95% dinner 10/30: 50% breakfast. 75% lunch, 20% dinner  Spoke with pt and her daughter at bedside. Pt is in good spirits. She reports that  she is not typically a big eater. Feels that she is eating at her baseline. She does not particularly enjoy ensure but is willing to continue to consume 1 per day. Ordered magic cups for pt to try as well. Pt's family has also been bringing in snacks and food items for pt to have available. She really enjoys pineapple shakes from [employer]. Encouraged family to bring this in for her.   Last updated weight 50.2 kg on 10/21 (~10 days ago)  Pt reported to have had constipation per review of chart. Senna added to bowel regimen yesterday in addition to already scheduled suppository. Pt now with multiple BM's.   Medications: Vitamin C 250mg  daily, dulcolax, megace, MVI, protonix, senna  Labs: sodium 134, BUN 41, Cr 1.36, GFR 39  Diet Order:   Diet Order             Diet regular Room service appropriate? Yes; Fluid consistency: Thin  Diet effective now                   EDUCATION NEEDS:   Education needs have been addressed  Skin:  Skin Assessment: Skin Integrity Issues: Skin Integrity Issues:: Stage II DTI: medial sacrum Stage II: bilateral heels; ischial tuberosity  Last BM:  10/30 (type 6 x4; 2 medium, 2 large)  Height:   Ht Readings from Last 1 Encounters:  12/12/22 5' (1.524 m)    Weight:   Wt Readings from Last 1 Encounters:  12/14/22 50.2 kg   BMI:  Body mass index is 21.61 kg/m.  Estimated Nutritional Needs:   Kcal:  1400-1600  Protein:  70-80g  Fluid:  >/=1.5L  Drusilla Kanner, RDN, LDN Clinical Nutrition

## 2022-12-24 NOTE — Progress Notes (Signed)
PROGRESS NOTE   Subjective/Complaints:  Pt had 2 extra large Bms with bowel program and afterwards last night.   Slept "great' even off Remeron.  Ate 1 pancake this AM- won't eat more though. Said wants tomato sandwiches for lunch.    D/w pharmacy -will stop Septra- probably the cause of Elevated BUN/Cr- so will stop       ROS:     Pt denies SOB, abd pain, CP, N/V/C/D, and vision changes   Except for HPI  Objective:   No results found. Recent Labs    12/22/22 0522  WBC 8.7  HGB 9.9*  HCT 31.0*  PLT 266   Recent Labs    12/23/22 0532 12/24/22 0623  NA 136 134*  K 4.9 4.6  CL 102 107  CO2 21* 19*  GLUCOSE 83 80  BUN 45* 41*  CREATININE 1.42* 1.36*  CALCIUM 8.3* 8.0*    Urinalysis    Component Value Date/Time   COLORURINE YELLOW 12/18/2022 1854   APPEARANCEUR HAZY (A) 12/18/2022 1854   LABSPEC 1.008 12/18/2022 1854   PHURINE 7.0 12/18/2022 1854   GLUCOSEU NEGATIVE 12/18/2022 1854   HGBUR SMALL (A) 12/18/2022 1854   BILIRUBINUR NEGATIVE 12/18/2022 1854   KETONESUR NEGATIVE 12/18/2022 1854   PROTEINUR NEGATIVE 12/18/2022 1854   NITRITE NEGATIVE 12/18/2022 1854   LEUKOCYTESUR MODERATE (A) 12/18/2022 1854      Intake/Output Summary (Last 24 hours) at 12/24/2022 0917 Last data filed at 12/24/2022 0559 Gross per 24 hour  Intake 280 ml  Output 1375 ml  Net -1095 ml     Pressure Injury 12/12/22 Sacrum Medial Deep Tissue Pressure Injury - Purple or maroon localized area of discolored intact skin or blood-filled blister due to damage of underlying soft tissue from pressure and/or shear. (Active)  12/12/22 1844  Location: Sacrum  Location Orientation: Medial  Staging: Deep Tissue Pressure Injury - Purple or maroon localized area of discolored intact skin or blood-filled blister due to damage of underlying soft tissue from pressure and/or shear.  Wound Description (Comments):   Present on  Admission: Yes     Pressure Injury 12/14/22 Heel Left;Lateral;Posterior Stage 2 -  Partial thickness loss of dermis presenting as a shallow open injury with a red, pink wound bed without slough. (Active)  12/14/22 (first assessed by [facility]) 1221  Location: Heel  Location Orientation: Left;Lateral;Posterior  Staging: Stage 2 -  Partial thickness loss of dermis presenting as a shallow open injury with a red, pink wound bed without slough.  Wound Description (Comments):   Present on Admission: No     Pressure Injury 12/14/22 Heel Right;Lateral Stage 2 -  Partial thickness loss of dermis presenting as a shallow open injury with a red, pink wound bed without slough. (Active)  12/14/22 (first assessed by [facility]) 1221  Location: Heel  Location Orientation: Right;Lateral  Staging: Stage 2 -  Partial thickness loss of dermis presenting as a shallow open injury with a red, pink wound bed without slough.  Wound Description (Comments):   Present on Admission: No     Pressure Injury 12/14/22 Ischial tuberosity Left;Medial Stage 2 -  Partial thickness loss of dermis presenting as a shallow open  injury with a red, pink wound bed without slough. blister (Active)  12/14/22 (first assessed by [facility] nurse) 1221  Location: Ischial tuberosity  Location Orientation: Left;Medial  Staging: Stage 2 -  Partial thickness loss of dermis presenting as a shallow open injury with a red, pink wound bed without slough.  Wound Description (Comments): blister  Present on Admission: No     Physical Exam: Vital Signs Blood pressure (!) 135/57, pulse 72, temperature 97.6 F (36.4 C), resp. rate 18, height 5' (1.524 m), weight 50.2 kg, SpO2 97%.       General: awake, alert, appropriate, sitting up  in bed; nurse at bedside; NAD HENT: conjugate gaze; oropharynx moist CV: regular rate and irregular rhythm; no JVD Pulmonary: decreased at bases- otherwise no W?R/R- 2L O2 GI: soft, NT, ND, (+)BS Psychiatric: appropriate-  brighter affect Neurological: Ox3  MS; was able to move RLE< but from abd muscles- not in LE muscles- no movement seen in LLE either-    PRIOR EXAMS: Skin: Groin incision site looks okay, bruising noted bilateral shins 2 IVs in place left upper extremity looks okay Extremities: unable to palpate L radial pulse but dopplers confirm pulse with rate in 60-70s, arm well perfused appearing, warm and pink, motor function preserved. Brachial pulse also unable to be felt. R radial pulse strong and palpable. Neuro:   Mental Status: AAOx3, memory intact,  Speech/Languate:  fluent, follows simple commands CRANIAL NERVES: II: PERRL. Visual fields full III, IV, VI: EOM intact, no gaze preference or deviation V: normal sensation bilaterally VII: no asymmetry VIII: normal hearing to speech IX, X: normal palatal elevation XI: 5/5 head turn and 5/5 shoulder shrug bilaterally XII: Tongue midline     MOTOR: RUE: 5/5 Deltoid, 5/5 Biceps, 5/5 Triceps,5/5 Grip LUE: 5/5 Deltoid, 5/5 Biceps, 5/5 Triceps, 5/5 Grip RLE: 0 out of 5 throughout LLE: 0 out of 5 throughout   SENSORY: Normal to touch all 4 extremities to light touch in bilateral upper and lower extremities, patchy altered sensation to cold in lower extremities   No ankle clonus bilaterally   Coordination: Normal finger to nose, no dysmetria  Assessment/Plan: 1. Functional deficits which require 3+ hours per day of interdisciplinary therapy in a comprehensive inpatient rehab setting. Physiatrist is providing close team supervision and 24 hour management of active medical problems listed below. Physiatrist and rehab team continue to assess barriers to discharge/monitor patient progress toward functional and medical goals  Care Tool:  Bathing    Body parts bathed by patient: Right arm, Left arm, Chest, Abdomen, Front perineal area, Face, Right upper leg, Left upper leg   Body parts bathed by helper: Right lower leg, Left lower leg,  Buttocks     Bathing assist Assist Level: Minimal Assistance - Patient > 75% (sitting in roll in shower chair)     Upper Body Dressing/Undressing Upper body dressing   What is the patient wearing?: Pull over shirt    Upper body assist Assist Level: Minimal Assistance - Patient > 75%    Lower Body Dressing/Undressing Lower body dressing      What is the patient wearing?: Incontinence brief, Pants     Lower body assist Assist for lower body dressing: Total Assistance - Patient < 25%     Toileting Toileting    Toileting assist Assist for toileting: Total Assistance - Patient < 25%     Transfers Chair/bed transfer  Transfers assist  Chair/bed transfer activity did not occur: Safety/medical concerns  Chair/bed transfer assist level:  Maximal Assistance - Patient 25 - 49%     Locomotion Ambulation   Ambulation assist   Ambulation activity did not occur: Safety/medical concerns (paraplegia, fatigue)          Walk 10 feet activity   Assist  Walk 10 feet activity did not occur: Safety/medical concerns (paraplegia, fatigue)        Walk 50 feet activity   Assist Walk 50 feet with 2 turns activity did not occur: Safety/medical concerns (paraplegia, fatigue)         Walk 150 feet activity   Assist Walk 150 feet activity did not occur: Safety/medical concerns (paraplegia, fatigue)         Walk 10 feet on uneven surface  activity   Assist Walk 10 feet on uneven surfaces activity did not occur: Safety/medical concerns (paraplegia, fatigue)         Wheelchair     Assist Is the patient using a wheelchair?: Yes Type of Wheelchair: Power Wheelchair activity did not occur: Safety/medical concerns (paraplegia, fatigue)  Wheelchair assist level: Minimal Assistance - Patient > 75% Max wheelchair distance: 150    Wheelchair 50 feet with 2 turns activity    Assist    Wheelchair 50 feet with 2 turns activity did not occur: Safety/medical  concerns (paraplegia, fatigue)   Assist Level: Minimal Assistance - Patient > 75%   Wheelchair 150 feet activity     Assist  Wheelchair 150 feet activity did not occur: Safety/medical concerns (paraplegia, fatigue)   Assist Level: Minimal Assistance - Patient > 75%   Blood pressure (!) 135/57, pulse 72, temperature 97.6 F (36.4 C), resp. rate 18, height 5' (1.524 m), weight 50.2 kg, SpO2 97%.  Medical Problem List and Plan: 1. 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  is 15/7 Will change to PRAFOs from Prevalons 2.  Antithrombotics: -DVT/anticoagulation:  Pharmaceutical: Eliquis 2.5mg  BID             -antiplatelet therapy: Aspirin 81 mg daily   3. Pain Management: Tylenol, robaxin, oxycodone as needed 10/24- will wait to try Tramadol for pain/discomfort til have tried  Remeron 7.5mg  for sleep 10/31- stopped Remeron 4. Mood/Behavior/Sleep: LCSW to evaluate and provide emotional support             -continue paroxetine 20 mg daily (hx of depression)             -antipsychotic agents: n/a -12/13/22 hasn't been sleeping well, increase melatonin to 10mg  QHS, added Trazodone 25mg  PRN but advised to be cautious about using it too late.  10/21- will stop Trazodone- can impair ability to pee- will start Restoril 7.5 mg at bedtime for sleep.  10/22- cannot start Remeron due to risk of torsades de pointes- will con't Restoril  10/23- didn't sleep well again- will increase Restoril to 15 mg at bedtime 10/24- spoke to Cards- they are ok trying Remeron but wait on Tramadol-will start 15 mg at bedtime -will stop Restoril>> down to 7.5mg  -12/19/22 slept "wonderfully"! Cont regimen 10/28- Slept poorly- still on rmeron- sounds like every other night she slept well 10/30- sleeping better 10/31-  stopped remeron- slept ok yesterday  as well  5. Neuropsych/cognition: This patient is capable of making decisions on her own behalf.   6. Skin/Wound Care: Routine skin care checks   7. Fluids/Electrolytes/Nutrition: Routine Is and Os and follow-up chemistries   8: Hypertension: monitor TID and prn (hx of a.fib see meds below). Monitor with mobility. Continue metoprolol 25mg  BID  -12/13/22 BPs a little up, monitor for trend 10/21- BP's running high up to 180s- systolic- but had documented BP 87/64 this AM- will wait to titrate BP meds. 10/22- BP still in 150s- but no LOW BP's this AM- will see how does in therapy- 10/23- reduced Metoprolol per pt request to 12.5 mg BID- might need Norvasc for BP control without affecting HR  10/24- will add Norvasc 2.5 mg daily since BP elevated and not coming down  10/25- calling IM- maybe they can help- just added Norvasc yesterday -12/19/22 BPs improving a little this morning, monitor -12/20/22 BPs still a tad elevated but stable, monitor a couple more days before deciding on changes for now 10/28- BP on low side, but Hb low- will transfuse 2 units 10/29- BP looking more labile- but better than it was- monitor for now 10/31- BP looking better- con't regimen Vitals:   12/21/22 1612 12/21/22 1643 12/21/22 1644 12/21/22 1952  BP: (!) 168/56 (!) 170/62 (!) 170/62 (!) 141/51   12/21/22 2037 12/22/22 0509 12/22/22 1942 12/23/22 0401  BP: (!) 163/95 (!) 116/55 (!) 117/51 (!) 129/48   12/23/22 1511 12/23/22 2037 12/24/22 0542 12/24/22 0746  BP: (!) 126/55 (!) 120/50 (!) 150/52 (!) 135/57                 9: Hyperlipidemia: continue pravastatin 40mg  daily   10: COPD, O2 dependent 2L via [place]:             -continue Brovana nebs BID             -continue Yupelri neb daily   10/21- Has been on 2L O2 at home per pt and chart  10/25- ABG shows CO2 too low- needs to reduce O2 levels  10/31- Sats ok- con't O2 11: CAD: stable on asa and statin   12: Atrial  fibrillation, paroxysmal: maintained on Eliquis reversed pre-operatively             -continue amiodarone 100 mg daily             -continue metoprolol tartrate 25 mg BID             -cleared by [facility] to re-start Eliquis>pharmacy consult placed   10/25- reduced Metoprolol to 12.5 mg BID earlier in week - doing better  10/30- BP doing better 13: 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 10/22- not voiding -U/A (-)  will add Flomax 0.4 mg q supper to see if she can  void- I don't think it's likely. D/w nursing due to low amount of caths-  10/23- pt reports still getting cathed- is better 10/24- pt swears drinking 6 cups/water day, however BUN up to 27 and Cr 1.11- and cath volumes very low- not clear if having incontinence of bladder 10/25- pt said incontinent of bladder as well as cath overnight- monitor -12/19/22 U/A overnight looks infectious, will start Bactrim DS BID x5d, UCx pending; monitor -12/20/22 UCx with >100k CFU of K. Pneumoniae, sensitive to bactrim, cont abx course 10/29- Being cathed- volumes low- pushing fluids, but will also give 1 L IVFs 10/30- not drinking- Got IVFs but BUN up to 45 and Cr 1.41- will call renal- and recheck in AM 17: ABLA: stable; follow CBC trend             -consider oral iron supplement -12/13/22 Hgb slowly downtrending from postop 8.8 on 10/14>7.6>7.5>7.3>7.0 yesterday;  pt asymptomatic at this time; would recheck tomorrow but if still at the 7.0-range then would consider transfusion to help with therapy tolerance; unclear what her prior baseline was (has hgb 12.2 back in July but then down to 9's).    10/21- Hb 7.4 today  10/23- Hb 7.2 yesterday- will check in AM 10/24- Hb down to 7.0- will d/w pt if she wants to be transfused. If so, will give 1 unit pRBCs.  Doesn't have location of losing blood- will check FOBT x3  10/25- Hb 8.1- likely too dry  10/28- Hb 6.7- will transfuse 2 units  10/29- Hb much better AND pt feels MUCH better per pt- Hb up to 9.9- likely so high since pt really dry- will also rewrite for FOBT- hasn't been done  10/30- no bowel results- so cannot check FOBT yet.   10/31- had 2 extra large Bms last night- didn't check FOBT_ will order again. Will d/w nursing 18: AKI: improving; CMP 12/12/22 showing Cr 1.31; monitor Monday BMPs 10/21- Cr 1.09 down from 1.31 and BUN 25 down from 25- however is drinking poorly and has poor output- 10/23- Says drinking better- will check BMP in AM  10/25- Cr 0.9 and BUN slightly high at 22- but based on Hb increase, likely dry 10/28- Cr 1.4 and BUN elevated- will give blood and recheck BMP in AM 10/29- BUN up to 40 and Cr 1.41- will need to give IVFs- tried seeing if blood would help, but still needs fluids- will her BUN/Cr- will replete 75 cc/hour after therapy today and recheck BMP in AM- of note, her Cr was running ~ 1.0 and her BUN ~ 25- so big change- will push fluids as well- will give 75cc/hour for 1 liter  10/31- likely form Septra- will stop ABX-  19: 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  10/22- continue to refuse bowel  program-in spite of education of bowel program and need for it.  10/23- pt did last night- had BM with bowel program and one this AM, but educated can take 3-6 weeks to train gut, but should be better- had bowel incontinence for 20 years 10/24- no bowel program documented  but had small BM's at midnight and 5am- said wasn't incontinent?will check on this 10/25- asked pt to get more dig stim this evening  since likely cause of bowel incontinent in AM -12/20/22 reports BM this morning but no BM since 12/17/22 documented, no report to nursing staff given; monitor closely but if no BM by tomorrow may need to consider additional adjustments 10/29- LBM yesterday AM, and small with bowel program 10/27-no results last night- if no BM tonight, will give sorbitol/check KUB  10/30- no significant BM for 3 days- will give Sorbitol 30cc at 2pm for bowel program- also add Senna 1 tab daily for meds- 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  10/31- had 2 extra large Bms last night- feeling better 20. Poor Appetite 10/22- will add Megace for poor appetite since cannot add Remeron and too sedated to add Periactin. Will also order push fluids since not drinking great- although her BUN?Cr looking better- BUN 25 and Cr 1.08-  10/23- pt feels she's drinking 6+ cups/day- ate muffin and some eggs this AM- better than last 2 days-  10/24- 25% of tray at most- will add Remeron 15 mg at bedtime- ok'd by Cards.  10/25- changed remeron to 7.5 mg QHS 10/30- won't eat anything for breakfast- small appetite overall-  10/31- ate 1 pancake this AM- better than normal 21. Prolonged QT interval 10/24- spoke with Cards- they suggested starting Remeron 15 mg at bedtime and then rechecking EKG ina few days to see if can add Tramadol. 10/29- doing ok with remeron- wait to add tramadol for now    22. Oversedation/lethargy/ UTI 10/25- septis and ABG work up- when speaking with PA- appeared to have ST elevation- asked them to  call IM consult-  -12/19/22 sleepy but arousable today, U/A+, started bactrim as above, awaiting UCx; CT head neg last night, CBC/BMP stable, TSH/T4 WNL, CXR stable; suspect UTI as source. Monitor. See above #16 10/30- much more awake-    23. L radial pulse nonpalpable but dopplerable, BPs different in that arm-- unclear when this started, had some pain in L deltoid area yesterday but seemed to be better today. Suspect this pulse/BP discrepancy is 2/2 her vascular issue already, but reached out to vascular surgery to verify. Dr. Karin Lieu returned page, stated this is known since her surgery; nothing to do or be concerned about.   24. Pulsatile abdominal aorta: not noticed previously, not mentioned in notes, but today 12/20/22 I noticed it was very prominently pulsatile and pt reported it being sore in that area; spoke with Dr. Karin Lieu again of [facility], recommended getting CTA Abd/pelv to evaluate for AAA. Will await results.      I spent a total of  37  minutes on total care today- >50% coordination of care- due to  D/w pharmacy about Septra- how playing a role in her elevated BUN/Cr- stopped- also d/w team and PA about FOBT- need to be done.    LOS: 12 days A FACE TO FACE EVALUATION WAS PERFORMED  Vaishnav Demartin 12/24/2022, 9:17 AM

## 2022-12-24 NOTE — Progress Notes (Signed)
Pt has received 5d of septra for cUTI. D/w Dr Berline Chough, ok to stop.   Ulyses Southward, PharmD, BCIDP, AAHIVP, CPP Infectious Disease Pharmacist 12/24/2022 9:17 AM

## 2022-12-24 NOTE — Progress Notes (Signed)
IP Rehab Bowel Program Documentation   Bowel Program Start time 2145  Dig Stim Indicated? Yes  Dig Stim Prior to Suppository or mini Enema X 1   Output from dig stim: Small  Ordered intervention: Suppository Yes , mini enema No ,   Repeat dig stim after Suppository or Mini enema  X 1,  Output? Minimal   Bowel Program Complete? Yes , handoff given   Patient Tolerated? Yes

## 2022-12-24 NOTE — Progress Notes (Signed)
Physical Therapy Session Note  Patient Details  Name: Heather Molina MRN: [id number] Date of Birth: [birth year]  Today's Date: 12/24/2022 PT Individual Time: 0805-0904, 4098-1191  PT Individual Time Calculation (min): 59 min , 57 min   Short Term Goals: Week 2:  PT Short Term Goal 1 (Week 2): Pt will complete WC mobility in PWC with supervision 150' navigating busy hallways PT Short Term Goal 2 (Week 2): Pt will require mod assist for supine>sit PT Short Term Goal 3 (Week 2): Pt will tolerate sitting balance on firm surface CGA x1 min PT Short Term Goal 4 (Week 2): Pt will complete SB transfer with mod assist  Skilled Therapeutic Interventions/Progress Updates:      Therapy Documentation Precautions:  Precautions Precautions: Fall Precaution Comments: paraplegia Restrictions Weight Bearing Restrictions: No  Treatment Session 1:   Pt received semi-reclined in bed, agreeable to PT and without reports of pain. Pt reports did not wear PRAFO boots previous night and educated on purpose to prevent contractures. Pt states she did not understand why she had to wear them but will plan to. Pt also educated to have staff float her heels off pillows as pt received with feet on pillow to decrease risk of skin breakdown. Pt repositioned in long sitting with HOB fully elevated to provided truncal support. Pt utilized bed rails to perform truncal flexion with doff/don of shirt with min A. PT dependently donned ted hose and pt educated on figure four technique and able to perform with supervision with pulling on ted hose into position. Pt required increased time with donning of pants (max assist). Pt CGA with rolling, mod/max A with supine<>sit, and max A with slide board transfer from air mattress to PWC. PT trial ed various joysticks, with use of mushroom pt collided with sink and observed bleeding and wound. Nurse notified and pt left in care of nursing staff.   Treatment Session 2:    Pt agreeable to  additional PT and received handoff from OT. Pt supervision to min A for PWC mobility in session. Pt mod A for slide board transfers from [facility] <> level mat table with 4 inch step positioned under patient's feet. Pt CGA to mod A for static and dynamic sitting balance while engaging in corn hole. Pt able to tolerate sitting edge of mat x 30 minutes and reports lightheadedness, BP recorded as 157/46 (76). Pt returned to bed and left semi-reclined with all needs in reach.    Therapy/Group: Individual Therapy  Truitt Leep Truitt Leep PT, DPT  12/24/2022, 7:43 AM

## 2022-12-24 NOTE — Progress Notes (Signed)
Occupational Therapy Session Note  Patient Details  Name: Heather Molina MRN: [id number] Date of Birth: Oct 08, 1940  Today's Date: 12/24/2022 OT Individual Time: 1015-1100 &1430-1533 OT Individual Time Calculation (min): 45 min & 63 min   Short Term Goals: Week 2:  OT Short Term Goal 1 (Week 2): Pt will complete commode transfer Max A with transfer board OT Short Term Goal 2 (Week 2): Pt will complete pressure relief without cues 25% of time throughout sessions  Skilled Therapeutic Interventions/Progress Updates:      Therapy Documentation Precautions:  Precautions Precautions: Fall Precaution Comments: paraplegia Restrictions Weight Bearing Restrictions: No Session 1 General: "Hi honey!" Pt seated in W/C upon OT arrival, agreeable to OT. Handoff from nursing.  Pain: no pain reported   Other Treatments: Pt demonstrated community mobility around therapy unit and ADL suite with PWC in order to prepare for community integration at D/C. Pt propelled in PWC requiring Min A for maneuvering/turning in tight spaces and no rest breaks. Pt practice maneuvering around obstacles and pulling into cabinets for reaching out of BOS wit Mod A to reach anteriorly into cabinet. OT trialed increased speed in empty hallway with PWC, pt able to manage through straight hallway. Pt educated on positioning in W/C for pressure relief.    Pt seated in W/C at end of session with W/C alarm donned, call light within reach and 4Ps assessed. Direct handoff from pt.    Session 2 General: "Oh me." Pt supine in bed upon OT arrival, agreeable to OT session.  Pain:  unrated pain reported in Lt forearm, activity, intermittent rest breaks, distractions provided for pain management, pt reports tolerable to proceed.   ADL: Transfers: Mod A of 1 scooting across transfer board W/C>bed Bed mobility: Mod A EOB supine  Other Treatments: Pt participated in individual BUE and BLE dance exercises in social setting  including reaching out of BOS, BUE and BLE ROM in all planes while seated in W/C in order to increase generalized strength/endurance to complete ADLs such as bathing with increased independence. Pt no noted LOB/SOB while participating. Pt required occasional VC to participate.   Pt supine in bed with bed alarm activated, 2 bed rails up, call light within reach and 4Ps assessed. Daughter present   Therapy/Group: Individual Therapy  [name], OTD, OTR/L 12/24/2022, 3:43 PM

## 2022-12-25 NOTE — Progress Notes (Signed)
Physical Therapy Session Note  Patient Details  Name: Heather Molina MRN: [id number] Date of Birth: [birth year]  Today's Date: 12/25/2022 PT Individual Time: 9528-4132 PT Individual Time Calculation (min): 70 min   Short Term Goals: Week 2:  PT Short Term Goal 1 (Week 2): Pt will complete WC mobility in PWC with supervision 150' navigating busy hallways PT Short Term Goal 2 (Week 2): Pt will require mod assist for supine>sit PT Short Term Goal 3 (Week 2): Pt will tolerate sitting balance on firm surface CGA x1 min PT Short Term Goal 4 (Week 2): Pt will complete SB transfer with mod assist  Skilled Therapeutic Interventions/Progress Updates:    Pt seated in w/c on arrival and agreeable to therapy. Pt reported UE pain largely controlled on medication, activity modification PRN. Pt sleeping on arrival, but easily roused. Discussed pressure relief extensively during session. Although pt states she knows she should, she often dozes off or forgets. Discussed setting up in tilted/reclined position for nap so doesn't remain in higher pressure position for long periods of time. Also performed lateral lean pressure relief while sitting on mat table x 2 min BIL. Educated on using this position EOB or on commode if set up permits.   slideboard transfer with assist to place board and cues for sequencing. Min a for transfer without full hip clearance. Encouraged pt to begin care direction by taking ownership of steps of transfer, chair placement, etc.   Session focused on sitting balance and LOB recovery with UE behind pt. Pt able to recover to R side with CGA-min and to L side with min-mod A from both elbow and extended arm. Pt also participated in anterior reaching to retrieve cones placed by feet to encourage recovery to midline from anterior LOB.   Pt navigated PWC with supervision in open spaces, but required assist for setting up transfer and tight spaces in room. Therapist provided further education on  chair features and menu navigation.   Pt returned to room and remained in PWC, was left with all needs in reach and alarm active.   Therapy Documentation Precautions:  Precautions Precautions: Fall Precaution Comments: paraplegia Restrictions Weight Bearing Restrictions: No General:       Therapy/Group: Individual Therapy  Juluis Rainier 12/25/2022, 12:45 PM

## 2022-12-25 NOTE — Progress Notes (Signed)
Occupational Therapy Session Note  Patient Details  Name: Heather Molina MRN: [id number] Date of Birth: 1940-09-17  Today's Date: 12/25/2022 OT Individual Time: (P) 0830-(P) 0930 OT Individual Time Calculation (min): (P) 60 min    Short Term Goals: Week 2:  OT Short Term Goal 1 (Week 2): Pt will complete commode transfer Max A with transfer board OT Short Term Goal 2 (Week 2): Pt will complete pressure relief without cues 25% of time throughout sessions Week 3:     Skilled Therapeutic Interventions/Progress Updates:    1:1 Pt received in the bed asleep but easily awaken. Pt participated in bathing and dressing. Pt choose to do sink bathe today. Assisted with sitting up in the bed with pillow behind her for better positioning. With assistance pt able to bring her legs up into figure 4 position to wash her lower legs and then taught how to maneuver them to return to knee extension. Pt performed this as well to assist in threading pants. Pt rolling with min A with leg loops with bed rail for clothing management - to pull up pants. Pt required mod A to come up into sitting position on EOB. Pt required min guard for sitting balance on air bed. Pt performed slide board transfer (even transfer with min A with VC for hand placement - with feet on a block (to help make the transfer even). At the sink pt participated in bathing at the sink - pt able to pull with UEs up into sitting but frequently falls laterally needing A for balance . Pt would benefit from custom back to power chair and for lateral supports. Pt still requires min to mod A for driving the power chair in small space and to turn around in the room.  Pt left sitting up in the chair in slight tilt with seat belt donned. And call bell in hand   Therapy Documentation Precautions:  Precautions Precautions: Fall Precaution Comments: paraplegia Restrictions Weight Bearing Restrictions: No   Vital Signs: Oxygen Therapy O2 Device: Nasal  Cannula O2 Flow Rate (L/min): 2 L/min Pain:  Pt does report pain "in her bone" in her humerus when using    Therapy/Group: Individual Therapy  Roney Mans [facility] 12/25/2022, 10:35 AM

## 2022-12-25 NOTE — Progress Notes (Signed)
PROGRESS NOTE   Subjective/Complaints: Sleepy this morning Had results with dig stim yesterday Patient's chart reviewed- No issues reported overnight Vitals signs stable except for HTN      ROS:     Pt denies SOB, abd pain, CP, N/V/C/D, and vision changes   Except for HPI  Objective:   No results found. No results for input(s): "WBC", "HGB", "HCT", "PLT" in the last 72 hours.  Recent Labs    12/23/22 0532 12/24/22 0623  NA 136 134*  K 4.9 4.6  CL 102 107  CO2 21* 19*  GLUCOSE 83 80  BUN 45* 41*  CREATININE 1.42* 1.36*  CALCIUM 8.3* 8.0*    Urinalysis    Component Value Date/Time   COLORURINE YELLOW 12/18/2022 1854   APPEARANCEUR HAZY (A) 12/18/2022 1854   LABSPEC 1.008 12/18/2022 1854   PHURINE 7.0 12/18/2022 1854   GLUCOSEU NEGATIVE 12/18/2022 1854   HGBUR SMALL (A) 12/18/2022 1854   BILIRUBINUR NEGATIVE 12/18/2022 1854   KETONESUR NEGATIVE 12/18/2022 1854   PROTEINUR NEGATIVE 12/18/2022 1854   NITRITE NEGATIVE 12/18/2022 1854   LEUKOCYTESUR MODERATE (A) 12/18/2022 1854      Intake/Output Summary (Last 24 hours) at 12/25/2022 0927 Last data filed at 12/25/2022 0739 Gross per 24 hour  Intake 240 ml  Output 1875 ml  Net -1635 ml     Pressure Injury 12/12/22 Sacrum Medial Deep Tissue Pressure Injury - Purple or maroon localized area of discolored intact skin or blood-filled blister due to damage of underlying soft tissue from pressure and/or shear. (Active)  12/12/22 1844  Location: Sacrum  Location Orientation: Medial  Staging: Deep Tissue Pressure Injury - Purple or maroon localized area of discolored intact skin or blood-filled blister due to damage of underlying soft tissue from pressure and/or shear.  Wound Description (Comments):   Present on Admission: Yes     Pressure Injury 12/14/22 Heel Left;Lateral;Posterior Stage 2 -  Partial thickness loss of dermis presenting as a shallow open  injury with a red, pink wound bed without slough. (Active)  12/14/22 (first assessed by [facility]) 1221  Location: Heel  Location Orientation: Left;Lateral;Posterior  Staging: Stage 2 -  Partial thickness loss of dermis presenting as a shallow open injury with a red, pink wound bed without slough.  Wound Description (Comments):   Present on Admission: No     Pressure Injury 12/14/22 Heel Right;Lateral Stage 2 -  Partial thickness loss of dermis presenting as a shallow open injury with a red, pink wound bed without slough. (Active)  12/14/22 (first assessed by [facility]) 1221  Location: Heel  Location Orientation: Right;Lateral  Staging: Stage 2 -  Partial thickness loss of dermis presenting as a shallow open injury with a red, pink wound bed without slough.  Wound Description (Comments):   Present on Admission: No     Pressure Injury 12/14/22 Ischial tuberosity Left;Medial Stage 2 -  Partial thickness loss of dermis presenting as a shallow open injury with a red, pink wound bed without slough. blister (Active)  12/14/22 (first assessed by [facility] nurse) 1221  Location: Ischial tuberosity  Location Orientation: Left;Medial  Staging: Stage 2 -  Partial thickness loss of dermis presenting as  a shallow open injury with a red, pink wound bed without slough.  Wound Description (Comments): blister  Present on Admission: No     Physical Exam: Vital Signs Blood pressure (!) 158/66, pulse 71, temperature 98.5 F (36.9 C), resp. rate 16, height 5' (1.524 m), weight 50.2 kg, SpO2 100%.       General: awake, alert, appropriate, sitting up  in bed; nurse at bedside; NAD HENT: conjugate gaze; oropharynx moist CV: regular rate and irregular rhythm; no JVD Pulmonary: decreased at bases- otherwise no W?R/R- 2L O2 GI: soft, NT, ND, (+)BS Psychiatric: appropriate- brighter affect Neurological: Ox3  MS; was able to move RLE< but from abd muscles- not in LE muscles- no movement seen in LLE either-  Skin:  Groin incision site looks okay, bruising noted bilateral shins 2 IVs in place left upper extremity looks okay Extremities: unable to palpate L radial pulse but dopplers confirm pulse with rate in 60-70s, arm well perfused appearing, warm and pink, motor function preserved. Brachial pulse also unable to be felt. R radial pulse strong and palpable. Neuro:   Mental Status: AAOx3, memory intact,  Speech/Languate:  fluent, follows simple commands CRANIAL NERVES: II: PERRL. Visual fields full III, IV, VI: EOM intact, no gaze preference or deviation V: normal sensation bilaterally VII: no asymmetry VIII: normal hearing to speech IX, X: normal palatal elevation XI: 5/5 head turn and 5/5 shoulder shrug bilaterally XII: Tongue midline     MOTOR: RUE: 5/5 Deltoid, 5/5 Biceps, 5/5 Triceps,5/5 Grip LUE: 5/5 Deltoid, 5/5 Biceps, 5/5 Triceps, 5/5 Grip RLE: 0 out of 5 throughout LLE: 0 out of 5 throughout, exam stable 11/1   SENSORY: Normal to touch all 4 extremities to light touch in bilateral upper and lower extremities, patchy altered sensation to cold in lower extremities   No ankle clonus bilaterally   Coordination: Normal finger to nose, no dysmetria  Assessment/Plan: 1. Functional deficits which require 3+ hours per day of interdisciplinary therapy in a comprehensive inpatient rehab setting. Physiatrist is providing close team supervision and 24 hour management of active medical problems listed below. Physiatrist and rehab team continue to assess barriers to discharge/monitor patient progress toward functional and medical goals  Care Tool:  Bathing    Body parts bathed by patient: Right arm, Left arm, Chest, Abdomen, Front perineal area, Face, Right upper leg, Left upper leg   Body parts bathed by helper: Right lower leg, Left lower leg, Buttocks     Bathing assist Assist Level: Minimal Assistance - Patient > 75% (sitting in roll in shower chair)     Upper Body  Dressing/Undressing Upper body dressing   What is the patient wearing?: Pull over shirt    Upper body assist Assist Level: Minimal Assistance - Patient > 75%    Lower Body Dressing/Undressing Lower body dressing      What is the patient wearing?: Incontinence brief, Pants     Lower body assist Assist for lower body dressing: Total Assistance - Patient < 25%     Toileting Toileting    Toileting assist Assist for toileting: Total Assistance - Patient < 25%     Transfers Chair/bed transfer  Transfers assist  Chair/bed transfer activity did not occur: Safety/medical concerns  Chair/bed transfer assist level: Moderate Assistance - Patient 50 - 74%     Locomotion Ambulation   Ambulation assist   Ambulation activity did not occur: Safety/medical concerns (paraplegia, fatigue)          Walk 10 feet activity  Assist  Walk 10 feet activity did not occur: Safety/medical concerns (paraplegia, fatigue)        Walk 50 feet activity   Assist Walk 50 feet with 2 turns activity did not occur: Safety/medical concerns (paraplegia, fatigue)         Walk 150 feet activity   Assist Walk 150 feet activity did not occur: Safety/medical concerns (paraplegia, fatigue)         Walk 10 feet on uneven surface  activity   Assist Walk 10 feet on uneven surfaces activity did not occur: Safety/medical concerns (paraplegia, fatigue)         Wheelchair     Assist Is the patient using a wheelchair?: Yes Type of Wheelchair: Manual Wheelchair activity did not occur: Safety/medical concerns (paraplegia, fatigue)  Wheelchair assist level: Minimal Assistance - Patient > 75% Max wheelchair distance: 150    Wheelchair 50 feet with 2 turns activity    Assist    Wheelchair 50 feet with 2 turns activity did not occur: Safety/medical concerns (paraplegia, fatigue)   Assist Level: Minimal Assistance - Patient > 75%   Wheelchair 150 feet activity      Assist  Wheelchair 150 feet activity did not occur: Safety/medical concerns (paraplegia, fatigue)   Assist Level: Minimal Assistance - Patient > 75%   Blood pressure (!) 158/66, pulse 71, temperature 98.5 F (36.9 C), resp. rate 16, height 5' (1.524 m), weight 50.2 kg, SpO2 100%.  Medical Problem List and Plan: 1. 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  is 15/7 Will change to PRAFOs from Prevalons 2.  Antithrombotics: -DVT/anticoagulation:  Pharmaceutical: Eliquis 2.5mg  BID             -antiplatelet therapy: Aspirin 81 mg daily   3. Pain Management: continue Tylenol, robaxin, oxycodone as needed 10/24- will wait to try Tramadol for pain/discomfort til have tried  Remeron 7.5mg  for sleep 10/31- stopped Remeron 4. Mood/Behavior/Sleep: LCSW to evaluate and provide emotional support             -continue paroxetine 20 mg daily (hx of depression)             -antipsychotic agents: n/a -12/13/22 hasn't been sleeping well, increase melatonin to 10mg  QHS, added Trazodone 25mg  PRN but advised to be cautious about using it too late.  10/21- will stop Trazodone- can impair ability to pee- will start Restoril 7.5 mg at bedtime for sleep.  10/22- cannot start Remeron due to risk of torsades de pointes- will con't Restoril  10/23- didn't sleep well again- will increase Restoril to 15 mg at bedtime 10/24- spoke to Cards- they are ok trying Remeron but wait on Tramadol-will start 15 mg at bedtime -will stop Restoril>> down to 7.5mg  -12/19/22 slept "wonderfully"! Cont regimen 10/28- Slept poorly- still on rmeron- sounds like every other night she slept well 10/30- sleeping better 10/31- stopped remeron- slept ok yesterday as well  5. Neuropsych/cognition: This patient is capable of making  decisions on her own behalf.   6. Skin/Wound Care: Routine skin care checks   7. Fluids/Electrolytes/Nutrition: Routine Is and Os and follow-up chemistries   8: Hypertension: monitor TID  and prn (hx of a.fib see meds below). Monitor with mobility. Continue metoprolol 25mg  BID  -12/13/22 BPs a little up, monitor for trend 10/21- BP's running high up to 180s- systolic- but had documented BP 87/64 this AM- will wait to titrate BP meds. 10/22- BP still in 150s- but no LOW BP's this AM- will see how does in therapy- 10/23- reduced Metoprolol per pt request to 12.5 mg BID- might need Norvasc for BP control without affecting HR  10/24- will add Norvasc 2.5 mg daily since BP elevated and not coming down  10/25- calling IM- maybe they can help- just added Norvasc yesterday -12/19/22 BPs improving a little this morning, monitor -12/20/22 BPs still a tad elevated but stable, monitor a couple more days before deciding on changes for now 10/28- BP on low side, but Hb low- will transfuse 2 units 10/29- BP looking more labile- but better than it was- monitor for now 11/1: continue current regimen Vitals:   12/21/22 2037 12/22/22 0509 12/22/22 1942 12/23/22 0401  BP: (!) 163/95 (!) 116/55 (!) 117/51 (!) 129/48   12/23/22 1511 12/23/22 2037 12/24/22 0542 12/24/22 0746  BP: (!) 126/55 (!) 120/50 (!) 150/52 (!) 135/57   12/24/22 1230 12/24/22 1344 12/24/22 2027 12/25/22 0509  BP: (!) 126/54 (!) 140/52 (!) 145/49 (!) 158/66                 9: Hyperlipidemia: continue pravastatin 40mg  daily   10: COPD, O2 dependent 2L via [place]:             -continue Brovana nebs BID             -continue Yupelri neb daily   10/21- Has been on 2L O2 at home per pt and chart  10/25- ABG shows CO2 too low- needs to reduce O2 levels  10/31- Sats ok- con't O2 11: CAD: stable on asa and statin   12: Atrial fibrillation, paroxysmal: maintained on Eliquis reversed pre-operatively             -continue amiodarone 100 mg  daily             -continue metoprolol tartrate 25 mg BID             -cleared by [facility] to re-start Eliquis>pharmacy consult placed   10/25- reduced Metoprolol to 12.5 mg BID earlier in week - doing better  10/30- BP doing better 13: 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 10/22- not voiding -U/A (-)  will add Flomax 0.4 mg q supper to see if she can void- I don't think it's likely. D/w nursing due to low amount of caths-  10/23- pt reports still getting cathed- is better 10/24- pt swears drinking 6 cups/water day, however BUN up to 27 and Cr 1.11- and cath volumes very low- not  clear if having incontinence of bladder 10/25- pt said incontinent of bladder as well as cath overnight- monitor -12/19/22 U/A overnight looks infectious, will start Bactrim DS BID x5d, UCx pending; monitor -12/20/22 UCx with >100k CFU of K. Pneumoniae, sensitive to bactrim, cont abx course 10/29- Being cathed- volumes low- pushing fluids, but will also give 1 L IVFs 10/30- not drinking- Got IVFs but BUN up to 45 and Cr 1.41- will call renal- and recheck in AM 17: ABLA: stable; follow CBC trend             -consider oral iron supplement -12/13/22 Hgb slowly downtrending from postop 8.8 on 10/14>7.6>7.5>7.3>7.0 yesterday; pt asymptomatic at this time; would recheck tomorrow but if still at the 7.0-range then would consider transfusion  to help with therapy tolerance; unclear what her prior baseline was (has hgb 12.2 back in July but then down to 9's).    10/21- Hb 7.4 today  10/23- Hb 7.2 yesterday- will check in AM 10/24- Hb down to 7.0- will d/w pt if she wants to be transfused. If so, will give 1 unit pRBCs.  Doesn't have location of losing blood- will check FOBT x3  10/25- Hb 8.1- likely too dry  10/28- Hb 6.7- will transfuse 2 units  10/29- Hb much better AND pt feels MUCH better per pt- Hb up to 9.9- likely so high since pt really dry- will also rewrite for FOBT- hasn't been done  10/30- no bowel results- so cannot check FOBT yet.   10/31- had 2 extra large Bms last night- didn't check FOBT_ will order again. Will d/w nursing 18: AKI: improving; CMP 12/12/22 showing Cr 1.31; monitor Monday BMPs 10/21- Cr 1.09 down from 1.31 and BUN 25 down from 25- however is drinking poorly and has poor output- 10/23- Says drinking better- will check BMP in AM  10/25- Cr 0.9 and BUN slightly high at 22- but based on Hb increase, likely dry 10/28- Cr 1.4 and BUN elevated- will give blood and recheck BMP in AM 10/29- BUN up to 40 and Cr 1.41- will need to give IVFs- tried seeing if blood would help, but still needs fluids- will her BUN/Cr- will replete 75 cc/hour after therapy today and recheck BMP in AM- of note, her Cr was running ~ 1.0 and her BUN ~ 25- so big change- will push fluids as well- will give 75cc/hour for 1 liter  10/31- likely form Septra- will stop ABX-  19: 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  10/22- continue to refuse bowel program-in spite of education of bowel program and need for it.  10/23- pt did last night- had BM with bowel  program and one this AM, but educated can take 3-6 weeks to train gut, but should be better- had bowel incontinence for 20 years 10/24- no bowel program documented  but had small BM's at midnight and 5am- said wasn't incontinent?will check on this 10/25- asked pt to get more dig stim this evening since likely cause of bowel incontinent in AM -12/20/22 reports BM this morning but no BM since 12/17/22 documented, no report to nursing staff given; monitor closely but if no BM by tomorrow may need to consider additional adjustments 10/29- LBM yesterday AM, and  small with bowel program 10/27-no results last night- if no BM tonight, will give sorbitol/check KUB  10/30- no significant BM for 3 days- will give Sorbitol 30cc at 2pm for bowel program- also add Senna 1 tab daily for meds- 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  10/31- had 2 extra large Bms last night- feeling better 20. Poor Appetite 10/22- will add Megace for poor appetite since cannot add Remeron and too sedated to add Periactin. Will also order push fluids since not drinking great- although her BUN?Cr looking better- BUN 25 and Cr 1.08-  10/23- pt feels she's drinking 6+ cups/day- ate muffin and some eggs this AM- better than last 2 days-  10/24- 25% of tray at most- will add Remeron 15 mg at bedtime- ok'd by Cards.  10/25- changed remeron to 7.5 mg QHS 10/30- won't eat anything for breakfast- small appetite overall-  10/31- ate 1 pancake this AM- better than normal 21. Prolonged QT interval 10/24- spoke with Cards- they suggested starting Remeron 15 mg at bedtime and then rechecking EKG ina few days to see if can add Tramadol. 10/29- doing ok with remeron- wait to add tramadol for now    22. Oversedation/lethargy/ UTI 10/25- septis and ABG work up- when speaking with PA- appeared to have ST elevation- asked them to call IM consult-  -12/19/22 sleepy but arousable today, U/A+, started bactrim as above, awaiting UCx; CT head  neg last night, CBC/BMP stable, TSH/T4 WNL, CXR stable; suspect UTI as source. Monitor. See above #16 10/30- much more awake-    23. L radial pulse nonpalpable but dopplerable, BPs different in that arm-- unclear when this started, had some pain in L deltoid area yesterday but seemed to be better today. Suspect this pulse/BP discrepancy is 2/2 her vascular issue already, but reached out to vascular surgery to verify. Dr. Karin Lieu returned page, stated this is known since her surgery; nothing to do or be concerned about.   24. Pulsatile abdominal aorta: not noticed previously, not mentioned in notes, but today 12/20/22 I noticed it was very prominently pulsatile and pt reported it being sore in that area; spoke with Dr. Karin Lieu again of [facility], recommended getting CTA Abd/pelv to evaluate for AAA. Will await results.    LOS: 13 days A FACE TO FACE EVALUATION WAS PERFORMED  Drema Pry [employer] 12/25/2022, 9:27 AM

## 2022-12-26 NOTE — Progress Notes (Signed)
Physical Therapy Session Note  Patient Details  Name: Heather Molina MRN: [id number] Date of Birth: Feb 14, 1941  Today's Date: 12/26/2022 PT Individual Time: 7829-5621 and 1002-1045 PT Individual Time Calculation (min): 45 min and 43 min.  Short Term Goals: Week 2:  PT Short Term Goal 1 (Week 2): Pt will complete WC mobility in PWC with supervision 150' navigating busy hallways PT Short Term Goal 2 (Week 2): Pt will require mod assist for supine>sit PT Short Term Goal 3 (Week 2): Pt will tolerate sitting balance on firm surface CGA x1 min PT Short Term Goal 4 (Week 2): Pt will complete SB transfer with mod assist  Skilled Therapeutic Interventions/Progress Updates:   First session:  Pt presents supine in bed and asleep, but quickly arouseable and agreeable to therapy.  PT donned TEDS w/ total A and then pants threaded over feet.  Pt elevated HOB for pt to reach pants and pull to bottom.  Pt able to roll side to side using siderails and CGA for completing pulling pants over hips.  Leg loops donned and then HOB again elevated for pt to use loops to move LES to EOB w/ occasional Mod A.  Pt transferred sidelying to sitting w/ mod A and siderails.  Pt scoots to EOB w/ min A.  Pt able to lean to Right for total A to place SB.  Mod A for SB transfer into PWC.  Pt remained sitting in PWC after positioning facing forward.  Pt cued for pressure relief, recalling reclining seat while in chair.  Seat belt applied and all needs in reach.   Second session:  Pt presents sitting up in PWC reclined back from previous session.  Pt negotiated PWC from room and down hallway to main gym w/ supervision at slow speed in confined space.  Educated on increased speed in open spaces later in session.  Pt negotiated cone obstacle course x 2 trials w/ 1 cone collision per trial.  Pt negotiated w/c to small gym w/o collision through doorways and then up/down ramp.  Required A from PT for U-turn at top of ramp 2/2 confined space.   Pt returned to room and performed SB transfer w/c>bed w/ mod A.  Pt returned to supine w/ mod A or LES.  All needs in reach.     Therapy Documentation Precautions:  Precautions Precautions: Fall Precaution Comments: paraplegia Restrictions Weight Bearing Restrictions: No General:   Vital Signs: Therapy Vitals Pulse Rate: 73 BP: (!) 181/72 Oxygen Therapy SpO2: 98 % O2 Device: Room Air Pain:0/10 1st session, 0/10 2nd session Pain Assessment Pain Scale: 0-10 Pain Score: 5  Pain Type: Acute pain Pain Location: Arm Pain Orientation: Left Pain Descriptors / Indicators: Aching Pain Frequency: Intermittent Pain Onset: On-going Patients Stated Pain Goal: 4 Pain Intervention(s): Medication (See eMAR)    Therapy/Group: Individual Therapy  Lucio Edward 12/26/2022, 9:43 AM

## 2022-12-26 NOTE — Progress Notes (Signed)
PROGRESS NOTE   Subjective/Complaints:  Pt doing well! Slept poorly due to her bed, but not much we can change about the bed unfortunately. In good spirits though. Denies pain. LBM last night. Urinating ok (getting caths). Denies any other complaints or concerns today.      ROS:     Pt denies SOB, abd pain, CP, N/V/C/D, and vision changes   Except for HPI  Objective:   No results found. No results for input(s): "WBC", "HGB", "HCT", "PLT" in the last 72 hours.  Recent Labs    12/24/22 0623  NA 134*  K 4.6  CL 107  CO2 19*  GLUCOSE 80  BUN 41*  CREATININE 1.36*  CALCIUM 8.0*        Intake/Output Summary (Last 24 hours) at 12/26/2022 1006 Last data filed at 12/26/2022 0755 Gross per 24 hour  Intake 720 ml  Output 2125 ml  Net -1405 ml     Pressure Injury 12/12/22 Sacrum Medial Deep Tissue Pressure Injury - Purple or maroon localized area of discolored intact skin or blood-filled blister due to damage of underlying soft tissue from pressure and/or shear. (Active)  12/12/22 1844  Location: Sacrum  Location Orientation: Medial  Staging: Deep Tissue Pressure Injury - Purple or maroon localized area of discolored intact skin or blood-filled blister due to damage of underlying soft tissue from pressure and/or shear.  Wound Description (Comments):   Present on Admission: Yes     Pressure Injury 12/14/22 Heel Left;Lateral;Posterior Stage 2 -  Partial thickness loss of dermis presenting as a shallow open injury with a red, pink wound bed without slough. (Active)  12/14/22 (first assessed by [facility]) 1221  Location: Heel  Location Orientation: Left;Lateral;Posterior  Staging: Stage 2 -  Partial thickness loss of dermis presenting as a shallow open injury with a red, pink wound bed without slough.  Wound Description (Comments):   Present on Admission: No     Pressure Injury 12/14/22 Heel Right;Lateral Stage 2 -   Partial thickness loss of dermis presenting as a shallow open injury with a red, pink wound bed without slough. (Active)  12/14/22 (first assessed by [facility]) 1221  Location: Heel  Location Orientation: Right;Lateral  Staging: Stage 2 -  Partial thickness loss of dermis presenting as a shallow open injury with a red, pink wound bed without slough.  Wound Description (Comments):   Present on Admission: No     Pressure Injury 12/14/22 Ischial tuberosity Left;Medial Stage 2 -  Partial thickness loss of dermis presenting as a shallow open injury with a red, pink wound bed without slough. blister (Active)  12/14/22 (first assessed by [facility] nurse) 1221  Location: Ischial tuberosity  Location Orientation: Left;Medial  Staging: Stage 2 -  Partial thickness loss of dermis presenting as a shallow open injury with a red, pink wound bed without slough.  Wound Description (Comments): blister  Present on Admission: No     Physical Exam: Vital Signs Blood pressure (!) 181/72, pulse 73, temperature 97.8 F (36.6 C), resp. rate 18, height 5' (1.524 m), weight 50.2 kg, SpO2 98%.   General: awake, alert, appropriate, laying in bed; tech and PT at bedside; NAD, looks  great HENT: conjugate gaze; oropharynx moist CV: regular rate and irregular rhythm; no JVD Pulmonary: CTAB no w/r/r appreciated, on 2L O2 GI: soft, NT, ND, (+)BS Psychiatric: appropriate- brighter affect Neurological: Ox3  PRIOR EXAMS: MS; was able to move RLE< but from abd muscles- not in LE muscles- no movement seen in LLE either-  Skin: Groin incision site looks okay, bruising noted bilateral shins 2 IVs in place left upper extremity looks okay Extremities: unable to palpate L radial pulse but dopplers confirm pulse with rate in 60-70s, arm well perfused appearing, warm and pink, motor function preserved. Brachial pulse also unable to be felt. R radial pulse strong and palpable. Neuro:   Mental Status: AAOx3, memory intact,   Speech/Languate:  fluent, follows simple commands CRANIAL NERVES: II: PERRL. Visual fields full III, IV, VI: EOM intact, no gaze preference or deviation V: normal sensation bilaterally VII: no asymmetry VIII: normal hearing to speech IX, X: normal palatal elevation XI: 5/5 head turn and 5/5 shoulder shrug bilaterally XII: Tongue midline     MOTOR: RUE: 5/5 Deltoid, 5/5 Biceps, 5/5 Triceps,5/5 Grip LUE: 5/5 Deltoid, 5/5 Biceps, 5/5 Triceps, 5/5 Grip RLE: 0 out of 5 throughout LLE: 0 out of 5 throughout, exam stable 11/1   SENSORY: Normal to touch all 4 extremities to light touch in bilateral upper and lower extremities, patchy altered sensation to cold in lower extremities   No ankle clonus bilaterally   Coordination: Normal finger to nose, no dysmetria  Assessment/Plan: 1. Functional deficits which require 3+ hours per day of interdisciplinary therapy in a comprehensive inpatient rehab setting. Physiatrist is providing close team supervision and 24 hour management of active medical problems listed below. Physiatrist and rehab team continue to assess barriers to discharge/monitor patient progress toward functional and medical goals  Care Tool:  Bathing    Body parts bathed by patient: Right arm, Left arm, Chest, Abdomen, Front perineal area, Face, Right upper leg, Left upper leg   Body parts bathed by helper: Right lower leg, Left lower leg, Buttocks     Bathing assist Assist Level: Minimal Assistance - Patient > 75% (sitting in roll in shower chair)     Upper Body Dressing/Undressing Upper body dressing   What is the patient wearing?: Pull over shirt    Upper body assist Assist Level: Minimal Assistance - Patient > 75%    Lower Body Dressing/Undressing Lower body dressing      What is the patient wearing?: Incontinence brief, Pants     Lower body assist Assist for lower body dressing: Total Assistance - Patient < 25%     Toileting Toileting    Toileting  assist Assist for toileting: Total Assistance - Patient < 25%     Transfers Chair/bed transfer  Transfers assist  Chair/bed transfer activity did not occur: Safety/medical concerns  Chair/bed transfer assist level: Moderate Assistance - Patient 50 - 74%     Locomotion Ambulation   Ambulation assist   Ambulation activity did not occur: Safety/medical concerns (paraplegia, fatigue)          Walk 10 feet activity   Assist  Walk 10 feet activity did not occur: Safety/medical concerns (paraplegia, fatigue)        Walk 50 feet activity   Assist Walk 50 feet with 2 turns activity did not occur: Safety/medical concerns (paraplegia, fatigue)         Walk 150 feet activity   Assist Walk 150 feet activity did not occur: Safety/medical concerns (paraplegia, fatigue)  Walk 10 feet on uneven surface  activity   Assist Walk 10 feet on uneven surfaces activity did not occur: Safety/medical concerns (paraplegia, fatigue)         Wheelchair     Assist Is the patient using a wheelchair?: Yes Type of Wheelchair: Manual Wheelchair activity did not occur: Safety/medical concerns (paraplegia, fatigue)  Wheelchair assist level: Minimal Assistance - Patient > 75% Max wheelchair distance: 150    Wheelchair 50 feet with 2 turns activity    Assist    Wheelchair 50 feet with 2 turns activity did not occur: Safety/medical concerns (paraplegia, fatigue)   Assist Level: Minimal Assistance - Patient > 75%   Wheelchair 150 feet activity     Assist  Wheelchair 150 feet activity did not occur: Safety/medical concerns (paraplegia, fatigue)   Assist Level: Minimal Assistance - Patient > 75%   Blood pressure (!) 181/72, pulse 73, temperature 97.8 F (36.6 C), resp. rate 18, height 5' (1.524 m), weight 50.2 kg, SpO2 98%.  Medical Problem List and Plan: 1. 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  is 15/7 Will change to PRAFOs from Prevalons 2.  Antithrombotics: -DVT/anticoagulation:  Pharmaceutical: Eliquis 2.5mg  BID             -antiplatelet therapy: Aspirin 81 mg daily   3. Pain Management: continue Tylenol, robaxin, oxycodone as needed 10/24- will wait to try Tramadol for pain/discomfort til have tried  Remeron 7.5mg  for sleep 10/31- stopped Remeron 4. Mood/Behavior/Sleep: LCSW to evaluate and provide emotional support             -continue paroxetine 20 mg daily (hx of depression)             -antipsychotic agents: n/a -12/13/22 hasn't been sleeping well, increase melatonin to 10mg  QHS, added Trazodone 25mg  PRN but advised to be cautious about using it too late.  10/21- will stop Trazodone- can impair ability to pee- will start Restoril 7.5 mg at bedtime for sleep.  10/22- cannot start Remeron due to risk of torsades de pointes- will con't Restoril  10/23- didn't sleep well again- will increase Restoril to 15 mg at bedtime 10/24- spoke to Cards- they are ok trying Remeron but wait on Tramadol-will start 15 mg at bedtime -will stop Restoril>> down to 7.5mg  -12/19/22 slept "wonderfully"! Cont regimen 10/28- Slept poorly- still on rmeron- sounds like every other night she slept well 10/30- sleeping better 10/31- stopped remeron- slept ok yesterday as well  -12/26/22 slept poorly d/t bed she says-- monitor for now 5. Neuropsych/cognition: This patient is capable of making decisions on her own behalf.   6. Skin/Wound Care: Routine skin care checks   7. Fluids/Electrolytes/Nutrition: Routine Is and Os and follow-up chemistries   8: Hypertension: monitor TID and prn (hx of a.fib see meds below). Monitor with mobility. Continue metoprolol 25mg  BID  -12/13/22 BPs a little up, monitor for trend 10/21- BP's running high up to 180s-  systolic- but had documented BP 87/64 this AM- will wait to titrate BP meds. 10/22- BP still in 150s- but no LOW BP's this AM- will see how does in therapy- 10/23- reduced Metoprolol  per pt request to 12.5 mg BID- might need Norvasc for BP control without affecting HR  10/24- will add Norvasc 2.5 mg daily since BP elevated and not coming down  10/25- calling IM- maybe they can help- just added Norvasc yesterday -12/19/22 BPs improving a little this morning, monitor -12/20/22 BPs still a tad elevated but stable, monitor a couple more days before deciding on changes for now 10/28- BP on low side, but Hb low- will transfuse 2 units 10/29- BP looking more labile- but better than it was- monitor for now 11/1: continue current regimen -12/26/22 BPs variable, monitor trend for now Vitals:   12/23/22 1511 12/23/22 2037 12/24/22 0542 12/24/22 0746  BP: (!) 126/55 (!) 120/50 (!) 150/52 (!) 135/57   12/24/22 1230 12/24/22 1344 12/24/22 2027 12/25/22 0509  BP: (!) 126/54 (!) 140/52 (!) 145/49 (!) 158/66   12/25/22 1349 12/25/22 2006 12/26/22 0349 12/26/22 0836  BP: (!) 132/49 (!) 159/59 (!) 185/71 (!) 181/72                 9: Hyperlipidemia: continue pravastatin 40mg  daily   10: COPD, O2 dependent 2L via [place]:             -continue Brovana nebs BID             -continue Yupelri neb daily   10/21- Has been on 2L O2 at home per pt and chart  10/25- ABG shows CO2 too low- needs to reduce O2 levels  10/31- Sats ok- con't O2  11: CAD: stable on asa and statin   12: Atrial fibrillation, paroxysmal: maintained on Eliquis reversed pre-operatively             -continue amiodarone 100 mg daily             -continue metoprolol tartrate 25 mg BID             -cleared by [facility] to re-start Eliquis>pharmacy consult placed   10/25- reduced Metoprolol to 12.5 mg BID earlier in week - doing better  10/30- BP doing better  13: 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 10/22- not voiding -U/A (-)  will add Flomax 0.4 mg q supper to see if she can void- I don't think it's likely. D/w nursing due to low amount of caths-  10/23- pt reports still getting cathed- is better 10/24- pt swears drinking 6 cups/water day, however BUN up to 27 and Cr 1.11- and cath volumes very low- not clear if having incontinence of bladder 10/25- pt said incontinent of bladder as well as cath overnight- monitor -12/19/22 U/A overnight looks infectious, will start Bactrim DS BID x5d, UCx pending; monitor -12/20/22 UCx with >100k CFU of K. Pneumoniae, sensitive to bactrim, cont abx course 10/29- Being cathed- volumes low- pushing fluids, but will also  give 1 L IVFs 10/30- not drinking- Got IVFs but BUN up to 45 and Cr 1.41- will call renal- and recheck in AM-- a little improved on 10/31 labs 17: ABLA: stable; follow CBC trend             -consider oral iron supplement -12/13/22 Hgb slowly downtrending from postop 8.8 on 10/14>7.6>7.5>7.3>7.0 yesterday; pt asymptomatic at this time; would recheck tomorrow but if still at the 7.0-range then would consider transfusion to help with therapy tolerance; unclear what her prior baseline was (has hgb 12.2 back in July but then down to 9's).    10/21- Hb 7.4 today  10/23- Hb 7.2 yesterday- will check in AM 10/24- Hb down to 7.0- will d/w pt if she wants to be transfused. If so, will give 1 unit pRBCs.   Doesn't have location of losing blood- will check FOBT x3  10/25- Hb 8.1- likely too dry  10/28- Hb 6.7- will transfuse 2 units 10/29- Hb much better AND pt feels MUCH better per pt- Hb up to 9.9- likely so high since pt really dry- will also rewrite for FOBT- hasn't been done  10/30- no bowel results- so cannot check FOBT yet.  10/31- had 2 extra large Bms last night- didn't check FOBT_ will order again. Will d/w nursing 18: AKI: improving; CMP 12/12/22 showing Cr 1.31; monitor Monday BMPs 10/21- Cr 1.09 down from 1.31 and BUN 25 down from 25- however is drinking poorly and has poor output- 10/23- Says drinking better- will check BMP in AM  10/25- Cr 0.9 and BUN slightly high at 22- but based on Hb increase, likely dry 10/28- Cr 1.4 and BUN elevated- will give blood and recheck BMP in AM 10/29- BUN up to 40 and Cr 1.41- will need to give IVFs- tried seeing if blood would help, but still needs fluids- will her BUN/Cr- will replete 75 cc/hour after therapy today and recheck BMP in AM- of note, her Cr was running ~ 1.0 and her BUN ~ 25- so big change- will push fluids as well- will give 75cc/hour for 1 liter  10/31- likely form Septra- will stop ABX-  19: 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  10/22- continue to refuse bowel program-in spite of education of bowel program and need for it.  10/23- pt did last night- had BM with bowel program and one this AM, but educated can take 3-6 weeks to train gut, but should be better- had bowel incontinence for 20 years 10/24- no bowel program documented  but had small BM's at midnight and 5am- said wasn't incontinent?will check on this 10/25- asked pt to get more dig stim this  evening since likely cause of bowel incontinent in AM -12/20/22 reports BM this morning but no BM since 12/17/22 documented, no report to nursing staff given; monitor closely but if no BM by tomorrow may need to consider additional adjustments 10/29- LBM yesterday AM, and small with bowel program 10/27-no results last night- if no BM tonight, will give sorbitol/check KUB  10/30- no significant BM for 3 days- will give Sorbitol 30cc at 2pm for bowel program- also add Senna 1 tab daily for meds- 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  -12/26/22  LBM last night, cont regimen 20. Poor Appetite 10/22- will add Megace for poor appetite since cannot add Remeron and too sedated to add Periactin. Will also order push fluids since not drinking great- although her BUN?Cr looking better- BUN 25 and Cr 1.08-  10/23- pt feels she's drinking 6+ cups/day- ate muffin and some eggs this AM- better than last 2 days-  10/24- 25% of tray at most- will add Remeron 15 mg at bedtime- ok'd by Cards.  10/25- changed remeron to 7.5 mg QHS 10/30- won't eat anything for breakfast- small appetite overall-  10/31- ate 1 pancake this AM- better than normal 21. Prolonged QT interval 10/24- spoke with Cards- they suggested starting Remeron 15 mg at bedtime and then rechecking EKG ina few days to see if can add Tramadol. 10/29- doing ok with remeron- wait to add tramadol for now    22. Oversedation/lethargy/ UTI 10/25- septis and ABG work up- when speaking with PA- appeared to have ST elevation- asked them to call IM consult-  -12/19/22 sleepy but arousable today, U/A+, started bactrim as above, awaiting UCx; CT head neg last night, CBC/BMP stable, TSH/T4 WNL, CXR stable; suspect UTI as source. Monitor. See above #16 10/30- much more awake-    23. L radial pulse nonpalpable but dopplerable, BPs different in that arm-- unclear when this started, had some pain in L deltoid area yesterday but seemed to be better today.  Suspect this pulse/BP discrepancy is 2/2 her vascular issue already, but reached out to vascular surgery to verify. Dr. Karin Lieu returned page, stated this is known since her surgery; nothing to do or be concerned about.   24. Pulsatile abdominal aorta: not noticed previously, not mentioned in notes, but today 12/20/22 I noticed it was very prominently pulsatile and pt reported it being sore in that area; spoke with Dr. Karin Lieu again of [facility], recommended getting CTA Abd/pelv to evaluate for AAA. 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 12/26/2022, 10:06 AM

## 2022-12-26 NOTE — Progress Notes (Signed)
Occupational Therapy Session Note  Patient Details  Name: Heather Molina MRN: [id number] Date of Birth: 1940-11-04  Today's Date: 12/26/2022 OT Individual Time: 8295-6213 OT Individual Time Calculation (min): 45 min    Short Term Goals: Week 1:  OT Short Term Goal 1 (Week 1): Pt will complete sitting EOB with Mod A to complete ADL task OT Short Term Goal 1 - Progress (Week 1): Met OT Short Term Goal 2 (Week 1): Pt will complete LB dressing with Max A OT Short Term Goal 2 - Progress (Week 1): Met OT Short Term Goal 3 (Week 1): Pt will complete UB dressing with Mod A OT Short Term Goal 3 - Progress (Week 1): Met  Skilled Therapeutic Interventions/Progress Updates:    Patient seen this day for skilled OT services. The pt was able to transfer from bed LOF to EOB using the leg straps with MinA using the bed rails for additional balance. The pt was able to transfer to her motorized w/c  from EOB using the step and sliding board with ModA and additional time. The pt was able to complete a simulated task in UB dressing using resistive band 5x with rest breaks as needed. The pt required 3 rest breaks. The pt went on to complete a  ball hand off exercise  while seated upright with attention to static/dynamic sit balance for core strength. The pt was able to complete the task 4x's with rest breaks as needed. The pt required 3 rest breaks. The pt went on to complete UB exercise using a 2lb dumb bell 1 set of 15 for bicep curls, and horizontal abduction with rest breaks as needed.  The pt required 2 rest breaks.  At the  end of the session, the pt was transferred to bed LOF  using the sliding board at [facility] while incorporating the bed rail for additional balance.  The pt legs were managed using the leg straps with MinA.  The call light and bedside table were both placed within reach with additional needs addressed.   Therapy Documentation Precautions:  Precautions Precautions: Fall Precaution Comments:  paraplegia Restrictions Weight Bearing Restrictions: No General:   Vital Signs: Therapy Vitals Temp: 98.4 F (36.9 C) Temp Source: Oral Pulse Rate: 72 Resp: 16 BP: (!) 142/49 Patient Position (if appropriate): Sitting Oxygen Therapy SpO2: 96 % O2 Device: Room Air Pain:   ADL: ADL Eating: Set up Where Assessed-Eating: Bed level Grooming: Supervision/safety Where Assessed-Grooming: Sitting at sink, Wheelchair Upper Body Bathing: Minimal assistance Where Assessed-Upper Body Bathing: Bed level Lower Body Bathing: Maximal assistance Where Assessed-Lower Body Bathing: Bed level Upper Body Dressing: Moderate assistance Where Assessed-Upper Body Dressing: Bed level Lower Body Dressing: Dependent Where Assessed-Lower Body Dressing: Bed level Toileting: Not assessed (foley) Where Assessed-Toileting: Bed level Toilet Transfer: Not assessed (safety concerns) Toilet Transfer Method: Unable to assess Tub/Shower Transfer: Unable to assess Tub/Shower Transfer Method: Unable to assess Film/video editor: Unable to assess Film/video editor Method: Unable to assess ADL Comments: ADLs completed bed level d/t decreased trunk support and increased independence with bed level activities Vision   Perception    Praxis   Balance   Exercises:   Other Treatments:     Therapy/Group: Individual Therapy  Lavona Mound 12/26/2022, 4:23 PM

## 2022-12-26 NOTE — Progress Notes (Signed)
IP Rehab Bowel Program Documentation   Bowel Program Start time 2137  Dig Stim Indicated? Yes  Dig Stim Prior to Suppository or mini Enema X 2   Output from dig stim: None  Ordered intervention: Suppository Yes , mini enema No ,   Repeat dig stim after Suppository or Mini enema  X 1,  Output? Moderate   Bowel Program Complete? Yes , handoff given yes  Patient Tolerated? Yes

## 2022-12-27 NOTE — Progress Notes (Signed)
PROGRESS NOTE   Subjective/Complaints:  Pt doing well again! Slept better last night. Denies pain. LBM last night. Urinating ok (getting caths, going fine). Denies any other complaints or concerns today.      ROS:     Pt denies SOB, abd pain, CP, N/V/C/D, and vision changes   Except for HPI  Objective:   No results found. No results for input(s): "WBC", "HGB", "HCT", "PLT" in the last 72 hours.  No results for input(s): "NA", "K", "CL", "CO2", "GLUCOSE", "BUN", "CREATININE", "CALCIUM" in the last 72 hours.       Intake/Output Summary (Last 24 hours) at 12/27/2022 1019 Last data filed at 12/27/2022 0743 Gross per 24 hour  Intake 956 ml  Output 2151 ml  Net -1195 ml     Pressure Injury 12/12/22 Sacrum Medial Deep Tissue Pressure Injury - Purple or maroon localized area of discolored intact skin or blood-filled blister due to damage of underlying soft tissue from pressure and/or shear. (Active)  12/12/22 1844  Location: Sacrum  Location Orientation: Medial  Staging: Deep Tissue Pressure Injury - Purple or maroon localized area of discolored intact skin or blood-filled blister due to damage of underlying soft tissue from pressure and/or shear.  Wound Description (Comments):   Present on Admission: Yes     Pressure Injury 12/14/22 Heel Left;Lateral;Posterior Stage 2 -  Partial thickness loss of dermis presenting as a shallow open injury with a red, pink wound bed without slough. (Active)  12/14/22 (first assessed by [facility]) 1221  Location: Heel  Location Orientation: Left;Lateral;Posterior  Staging: Stage 2 -  Partial thickness loss of dermis presenting as a shallow open injury with a red, pink wound bed without slough.  Wound Description (Comments):   Present on Admission: No     Pressure Injury 12/14/22 Heel Right;Lateral Stage 2 -  Partial thickness loss of dermis presenting as a shallow open injury with a red, pink  wound bed without slough. (Active)  12/14/22 (first assessed by [facility]) 1221  Location: Heel  Location Orientation: Right;Lateral  Staging: Stage 2 -  Partial thickness loss of dermis presenting as a shallow open injury with a red, pink wound bed without slough.  Wound Description (Comments):   Present on Admission: No     Pressure Injury 12/14/22 Ischial tuberosity Left;Medial Stage 2 -  Partial thickness loss of dermis presenting as a shallow open injury with a red, pink wound bed without slough. blister (Active)  12/14/22 (first assessed by [facility] nurse) 1221  Location: Ischial tuberosity  Location Orientation: Left;Medial  Staging: Stage 2 -  Partial thickness loss of dermis presenting as a shallow open injury with a red, pink wound bed without slough.  Wound Description (Comments): blister  Present on Admission: No     Physical Exam: Vital Signs Blood pressure (!) 159/63, pulse 67, temperature 98.4 F (36.9 C), resp. rate 17, height 5' (1.524 m), weight 50.2 kg, SpO2 100%.   General: awake, alert, appropriate, laying in bed; in great spirits; NAD, looks great HENT: conjugate gaze; oropharynx moist CV: regular rate and irregular rhythm; no JVD Pulmonary: CTAB no w/r/r appreciated, on 2L O2 GI: soft, NT, ND, (+)BS Psychiatric: appropriate- brighter  affect Neurological: Ox3  PRIOR EXAMS: MS; was able to move RLE< but from abd muscles- not in LE muscles- no movement seen in LLE either-  Skin: Groin incision site looks okay, bruising noted bilateral shins 2 IVs in place left upper extremity looks okay Extremities: unable to palpate L radial pulse but dopplers confirm pulse with rate in 60-70s, arm well perfused appearing, warm and pink, motor function preserved. Brachial pulse also unable to be felt. R radial pulse strong and palpable. Neuro:   Mental Status: AAOx3, memory intact,  Speech/Languate:  fluent, follows simple commands CRANIAL NERVES: II: PERRL. Visual fields full III,  IV, VI: EOM intact, no gaze preference or deviation V: normal sensation bilaterally VII: no asymmetry VIII: normal hearing to speech IX, X: normal palatal elevation XI: 5/5 head turn and 5/5 shoulder shrug bilaterally XII: Tongue midline     MOTOR: RUE: 5/5 Deltoid, 5/5 Biceps, 5/5 Triceps,5/5 Grip LUE: 5/5 Deltoid, 5/5 Biceps, 5/5 Triceps, 5/5 Grip RLE: 0 out of 5 throughout LLE: 0 out of 5 throughout, exam stable 11/1   SENSORY: Normal to touch all 4 extremities to light touch in bilateral upper and lower extremities, patchy altered sensation to cold in lower extremities   No ankle clonus bilaterally   Coordination: Normal finger to nose, no dysmetria  Assessment/Plan: 1. Functional deficits which require 3+ hours per day of interdisciplinary therapy in a comprehensive inpatient rehab setting. Physiatrist is providing close team supervision and 24 hour management of active medical problems listed below. Physiatrist and rehab team continue to assess barriers to discharge/monitor patient progress toward functional and medical goals  Care Tool:  Bathing    Body parts bathed by patient: Right arm, Left arm, Chest, Abdomen, Front perineal area, Face, Right upper leg, Left upper leg   Body parts bathed by helper: Right lower leg, Left lower leg, Buttocks     Bathing assist Assist Level: Minimal Assistance - Patient > 75% (sitting in roll in shower chair)     Upper Body Dressing/Undressing Upper body dressing   What is the patient wearing?: Pull over shirt    Upper body assist Assist Level: Minimal Assistance - Patient > 75%    Lower Body Dressing/Undressing Lower body dressing      What is the patient wearing?: Incontinence brief, Pants     Lower body assist Assist for lower body dressing: Total Assistance - Patient < 25%     Toileting Toileting    Toileting assist Assist for toileting: Total Assistance - Patient < 25%     Transfers Chair/bed  transfer  Transfers assist  Chair/bed transfer activity did not occur: Safety/medical concerns  Chair/bed transfer assist level: Moderate Assistance - Patient 50 - 74%     Locomotion Ambulation   Ambulation assist   Ambulation activity did not occur: Safety/medical concerns (paraplegia, fatigue)          Walk 10 feet activity   Assist  Walk 10 feet activity did not occur: Safety/medical concerns (paraplegia, fatigue)        Walk 50 feet activity   Assist Walk 50 feet with 2 turns activity did not occur: Safety/medical concerns (paraplegia, fatigue)         Walk 150 feet activity   Assist Walk 150 feet activity did not occur: Safety/medical concerns (paraplegia, fatigue)         Walk 10 feet on uneven surface  activity   Assist Walk 10 feet on uneven surfaces activity did not occur: Safety/medical concerns (  paraplegia, fatigue)         Wheelchair     Assist Is the patient using a wheelchair?: Yes Type of Wheelchair: Power Wheelchair activity did not occur: Safety/medical concerns (paraplegia, fatigue)  Wheelchair assist level: Minimal Assistance - Patient > 75% Max wheelchair distance: 200    Wheelchair 50 feet with 2 turns activity    Assist    Wheelchair 50 feet with 2 turns activity did not occur: Safety/medical concerns (paraplegia, fatigue)   Assist Level: Minimal Assistance - Patient > 75%   Wheelchair 150 feet activity     Assist  Wheelchair 150 feet activity did not occur: Safety/medical concerns (paraplegia, fatigue)   Assist Level: Minimal Assistance - Patient > 75%   Blood pressure (!) 159/63, pulse 67, temperature 98.4 F (36.9 C), resp. rate 17, height 5' (1.524 m), weight 50.2 kg, SpO2 100%.  Medical Problem List and Plan: 1. 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  is 15/7 Will change to PRAFOs from Prevalons 2.  Antithrombotics: -DVT/anticoagulation:  Pharmaceutical: Eliquis 2.5mg  BID             -antiplatelet therapy: Aspirin 81 mg daily   3. Pain Management: continue Tylenol, robaxin, oxycodone as needed 10/24- will wait to try Tramadol for pain/discomfort til have tried  Remeron 7.5mg  for sleep 10/31- stopped Remeron 4. Mood/Behavior/Sleep: LCSW to evaluate and provide emotional support             -continue paroxetine 20 mg daily (hx of depression)             -antipsychotic agents: n/a -12/13/22 hasn't been sleeping well, increase melatonin to 10mg  QHS, added Trazodone 25mg  PRN but advised to be cautious about using it too late.  10/21- will stop Trazodone- can impair ability to pee- will start Restoril 7.5 mg at bedtime for sleep.  10/22- cannot start Remeron due to risk of torsades de pointes- will con't Restoril  10/23- didn't sleep well again- will increase Restoril to 15 mg at bedtime 10/24- spoke to Cards- they are ok trying Remeron but wait on Tramadol-will start 15 mg at bedtime -will stop Restoril>> down to 7.5mg  -12/19/22 slept "wonderfully"! Cont regimen 10/28- Slept poorly- still on rmeron- sounds like every other night she slept well 10/30- sleeping better 10/31- stopped remeron- slept ok yesterday as well  -12/26/22 slept poorly d/t bed she says-- monitor for now-- better 12/27/22 5. Neuropsych/cognition: This patient is capable of making decisions on her own behalf.   6. Skin/Wound Care: Routine skin care checks   7. Fluids/Electrolytes/Nutrition: Routine Is and Os and follow-up chemistries   8: Hypertension: monitor TID and prn (hx of a.fib see meds below). Monitor with mobility. Continue metoprolol 25mg  BID  -12/13/22 BPs a little up, monitor for trend 10/21- BP's running high up to 180s- systolic- but had documented BP 87/64 this AM- will wait to titrate BP  meds. 10/22- BP still in 150s- but no LOW BP's this AM- will see how does in therapy- 10/23- reduced Metoprolol per pt request to 12.5 mg BID- might need Norvasc for BP control without affecting HR  10/24- will add Norvasc  2.5 mg daily since BP elevated and not coming down  10/25- calling IM- maybe they can help- just added Norvasc yesterday -12/19/22 BPs improving a little this morning, monitor -12/20/22 BPs still a tad elevated but stable, monitor a couple more days before deciding on changes for now 10/28- BP on low side, but Hb low- will transfuse 2 units 10/29- BP looking more labile- but better than it was- monitor for now 11/1: continue current regimen -11/2-3/24 BPs variable, monitor trend for now Vitals:   12/24/22 1230 12/24/22 1344 12/24/22 2027 12/25/22 0509  BP: (!) 126/54 (!) 140/52 (!) 145/49 (!) 158/66   12/25/22 1349 12/25/22 2006 12/26/22 0349 12/26/22 0836  BP: (!) 132/49 (!) 159/59 (!) 185/71 (!) 181/72   12/26/22 1357 12/26/22 1907 12/27/22 0424 12/27/22 0838  BP: (!) 142/49 (!) 144/52 (!) 161/60 (!) 159/63                 9: Hyperlipidemia: continue pravastatin 40mg  daily   10: COPD, O2 dependent 2L via [place]:             -continue Brovana nebs BID             -continue Yupelri neb daily   10/21- Has been on 2L O2 at home per pt and chart  10/25- ABG shows CO2 too low- needs to reduce O2 levels  10/31- Sats ok- con't O2  11: CAD: stable on asa and statin   12: Atrial fibrillation, paroxysmal: maintained on Eliquis reversed pre-operatively             -continue amiodarone 100 mg daily             -continue metoprolol tartrate 25 mg BID             -cleared by [facility] to re-start Eliquis>pharmacy consult placed   10/25- reduced Metoprolol to 12.5 mg BID earlier in week - doing better  10/30- BP doing better  13: 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 10/22- not voiding -U/A (-)  will add Flomax 0.4 mg q supper to see if she can void- I don't think it's likely. D/w nursing due to low amount of caths-  10/23- pt reports still getting cathed- is better 10/24- pt swears drinking 6 cups/water day, however BUN up to 27 and Cr 1.11- and cath volumes very low- not clear if having incontinence of bladder 10/25- pt said incontinent of bladder as well as cath overnight- monitor -12/19/22 U/A overnight looks infectious, will start Bactrim DS BID x5d, UCx pending; monitor -12/20/22 UCx with >100k CFU of K. Pneumoniae, sensitive to bactrim, cont abx course 10/29- Being cathed- volumes low- pushing fluids, but will also give 1 L IVFs 10/30- not drinking- Got IVFs but BUN up to 45 and Cr 1.41- will call renal- and  recheck in AM-- a little improved on 10/31 labs 17: ABLA: stable; follow CBC trend             -consider oral iron supplement -12/13/22 Hgb slowly downtrending from postop 8.8 on 10/14>7.6>7.5>7.3>7.0 yesterday; pt asymptomatic at this time; would recheck tomorrow but if still at the 7.0-range then would consider transfusion to help with therapy tolerance; unclear what her prior baseline was (has hgb 12.2 back in July but then down to 9's).    10/21- Hb 7.4 today  10/23- Hb 7.2 yesterday- will check in AM 10/24- Hb down to 7.0- will d/w pt if she wants to be transfused. If so, will give 1 unit pRBCs.  Doesn't have location of losing blood- will check FOBT x3  10/25- Hb  8.1- likely too dry  10/28- Hb 6.7- will transfuse 2 units 10/29- Hb much better AND pt feels MUCH better per pt- Hb up to 9.9- likely so high since pt really dry- will also rewrite for FOBT- hasn't been done  10/30- no bowel results- so cannot check FOBT yet.  10/31- had 2 extra large Bms last night- didn't check FOBT_ will order again. Will d/w nursing 18: AKI: improving; CMP 12/12/22 showing Cr 1.31; monitor Monday BMPs 10/21- Cr 1.09 down from 1.31 and BUN 25 down from 25- however is drinking poorly and has poor output- 10/23- Says drinking better- will check BMP in AM  10/25- Cr 0.9 and BUN slightly high at 22- but based on Hb increase, likely dry 10/28- Cr 1.4 and BUN elevated- will give blood and recheck BMP in AM 10/29- BUN up to 40 and Cr 1.41- will need to give IVFs- tried seeing if blood would help, but still needs fluids- will her BUN/Cr- will replete 75 cc/hour after therapy today and recheck BMP in AM- of note, her Cr was running ~ 1.0 and her BUN ~ 25- so big change- will push fluids as well- will give 75cc/hour for 1 liter  10/31- likely form Septra- will stop ABX-  19: 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  10/22- continue to refuse bowel program-in spite of education of bowel program and need for it.  10/23- pt did last night- had BM with bowel program and one this AM, but educated can take 3-6 weeks to train gut, but should be better- had bowel incontinence for 20 years 10/24- no bowel program documented  but had small BM's at midnight and 5am- said wasn't incontinent?will check on this 10/25- asked pt to get more dig stim this evening since likely cause of bowel incontinent in AM -12/20/22  reports BM this morning but no BM since 12/17/22 documented, no report to nursing staff given; monitor closely but if no BM by tomorrow may need to consider additional adjustments 10/29- LBM yesterday AM, and small with bowel program 10/27-no results last night- if no BM tonight, will give sorbitol/check KUB  10/30- no significant BM for 3 days- will give Sorbitol 30cc at 2pm for bowel program- also add Senna 1 tab daily for meds- 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  -12/27/22 LBM last night, cont regimen 20. Poor Appetite 10/22- will add Megace for poor appetite since cannot add Remeron and too  sedated to add Periactin. Will also order push fluids since not drinking great- although her BUN?Cr looking better- BUN 25 and Cr 1.08-  10/23- pt feels she's drinking 6+ cups/day- ate muffin and some eggs this AM- better than last 2 days-  10/24- 25% of tray at most- will add Remeron 15 mg at bedtime- ok'd by Cards.  10/25- changed remeron to 7.5 mg QHS 10/30- won't eat anything for breakfast- small appetite overall-  10/31- ate 1 pancake this AM- better than normal 21. Prolonged QT interval 10/24- spoke with Cards- they suggested starting Remeron 15 mg at bedtime and then rechecking EKG ina few days to see if can add Tramadol. 10/29- doing ok with remeron- wait to add tramadol for now    22. Oversedation/lethargy/ UTI 10/25- septis and ABG work up- when speaking with PA- appeared to have ST elevation- asked them to call IM consult-  -12/19/22 sleepy but arousable today, U/A+, started bactrim as above, awaiting UCx; CT head neg last night, CBC/BMP stable, TSH/T4 WNL, CXR stable; suspect UTI as source. Monitor. See above #16 10/30- much more awake-    23. L radial pulse nonpalpable but dopplerable, BPs different in that arm-- unclear when this started, had some pain in L deltoid area yesterday but seemed to be better today. Suspect this pulse/BP discrepancy is 2/2 her vascular issue  already, but reached out to vascular surgery to verify. Dr. Karin Lieu returned page, stated this is known since her surgery; nothing to do or be concerned about.   24. Pulsatile abdominal aorta: not noticed previously, not mentioned in notes, but today 12/20/22 I noticed it was very prominently pulsatile and pt reported it being sore in that area; spoke with Dr. Karin Lieu again of [facility], recommended getting CTA Abd/pelv to evaluate for AAA. 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 12/27/2022, 10:19 AM

## 2022-12-27 NOTE — Progress Notes (Signed)
Physical Therapy Session Note  Patient Details  Name: Heather Molina MRN: [id number] Date of Birth: December 02, 1940  Today's Date: 12/27/2022 PT Individual Time: 0900-0945 PT Individual Time Calculation (min): 45 min   Short Term Goals: Week 2:  PT Short Term Goal 1 (Week 2): Pt will complete WC mobility in PWC with supervision 150' navigating busy hallways PT Short Term Goal 2 (Week 2): Pt will require mod assist for supine>sit PT Short Term Goal 3 (Week 2): Pt will tolerate sitting balance on firm surface CGA x1 min PT Short Term Goal 4 (Week 2): Pt will complete SB transfer with mod assist  Skilled Therapeutic Interventions/Progress Updates:      Therapy Documentation Precautions:  Precautions Precautions: Fall Precaution Comments: paraplegia Restrictions Weight Bearing Restrictions: No   Pt agreeable to PT session with emphasis on bed mobility, dynamic sitting balance, and level slide board transfer training. Pt without reports of pain and PT dependently donned pants, ted hose, shoes, and leg loops. Pt mod A for upper body dressing. Pt mod A with HOB elevated with supine>sit and with slide board transfer to [facility]. Pt min- supervision with PWC mobility and requires cues for obstacle negotiation on low speeds. Pt engaged in blocked practice of slide board transfers x 4 from mat table with min/mod A and cues for head/hips relationship. Pt left with all needs in reach in PWC and PT reviewed pressure relief frequency and schedule.    Therapy/Group: Individual Therapy  Truitt Leep Truitt Leep PT, DPT  12/27/2022, 7:35 AM

## 2022-12-27 NOTE — Progress Notes (Signed)
Occupational Therapy Session Note  Patient Details  Name: Heather Molina MRN: [id number] Date of Birth: [birth year]  Today's Date: 12/27/2022 OT Individual Time: 1134-1200 & 1445-1530 OT Individual Time Calculation (min): 26 min & 60 min and Today's Date: 12/27/2022 OT Missed Time: 19 Minutes Missed Time Reason: Nursing care (IC)   Short Term Goals: Week 2:  OT Short Term Goal 1 (Week 2): Pt will complete commode transfer Max A with transfer board OT Short Term Goal 2 (Week 2): Pt will complete pressure relief without cues 25% of time throughout sessions  Skilled Therapeutic Interventions/Progress Updates:      Therapy Documentation Precautions:  Precautions Precautions: Fall Precaution Comments: paraplegia Restrictions Weight Bearing Restrictions: No Session 1 General: "I'd love to wash up really quick." Pt seated in W/C upon OT arrival, agreeable to OT. Missed minutes d/t nursing care for IC.  Pain:  intermittent pain reported in Lt forearm, activity, intermittent rest breaks, distractions provided for pain management, pt reports tolerable to proceed.   ADL: Grooming: SBA seated in PWC at sink, able to position W/C pulling into sink with SBA with safe technique. Pt able to pull self forward using sink counter to reach items at back of sink Oral hygiene: SBA   Other Treatments: Pt demonstrated teach back method for pressure relief positioning in PWC. OT re-educated pressure relief schedule for pressure injury prevention.    Pt seated in W/C at end of session with W/C alarm donned, call light within reach and 4Ps assessed. Grandson present.   Session 2 General: "Oh hi honey!" Pt seated in W/C upon OT arrival, agreeable to OT.  Pain: no pain reported  ADL: Transfers: pt completed 3x transfers with Min A with lateral transfer board with bilateral knee block and placing board, VC for technique W/C><EOM, scooting technique Bed mobility: Mod A required to manage LE into  bed Footwear: total A to doff shoes and TEDs   Balance: pt seated EOM completing 3x10 trials of throwing lightly weighted medicine ball for increased core strength/stability. Pt throwing with bilateral hands in order to increase challenge with Mod A from OT. Pt rest break static sitting on mat CGA with occasional SBA.   Pt supine in bed with bed alarm activated, 2 bed rails up, call light within reach and 4Ps assessed.   Therapy/Group: Individual Therapy  [name], OTD, OTR/L 12/27/2022, 4:07 PM

## 2022-12-28 LAB — CBC WITH DIFFERENTIAL/PLATELET
Abs Immature Granulocytes: 0.03 10*3/uL (ref 0.00–0.07)
Basophils Absolute: 0 10*3/uL (ref 0.0–0.1)
Basophils Relative: 1 %
Eosinophils Absolute: 0.2 10*3/uL (ref 0.0–0.5)
Eosinophils Relative: 3 %
HCT: 29.4 % — ABNORMAL LOW (ref 36.0–46.0)
Hemoglobin: 9.2 g/dL — ABNORMAL LOW (ref 12.0–15.0)
Immature Granulocytes: 0 %
Lymphocytes Relative: 11 %
Lymphs Abs: 0.8 10*3/uL (ref 0.7–4.0)
MCH: 27.2 pg (ref 26.0–34.0)
MCHC: 31.3 g/dL (ref 30.0–36.0)
MCV: 87 fL (ref 80.0–100.0)
Monocytes Absolute: 0.6 10*3/uL (ref 0.1–1.0)
Monocytes Relative: 9 %
Neutro Abs: 5 10*3/uL (ref 1.7–7.7)
Neutrophils Relative %: 76 %
Platelets: 273 10*3/uL (ref 150–400)
RBC: 3.38 MIL/uL — ABNORMAL LOW (ref 3.87–5.11)
RDW: 17 % — ABNORMAL HIGH (ref 11.5–15.5)
WBC: 6.7 10*3/uL (ref 4.0–10.5)
nRBC: 0 % (ref 0.0–0.2)

## 2022-12-28 LAB — BASIC METABOLIC PANEL
Anion gap: 6 (ref 5–15)
BUN: 29 mg/dL — ABNORMAL HIGH (ref 8–23)
CO2: 24 mmol/L (ref 22–32)
Calcium: 8.1 mg/dL — ABNORMAL LOW (ref 8.9–10.3)
Chloride: 106 mmol/L (ref 98–111)
Creatinine, Ser: 1.16 mg/dL — ABNORMAL HIGH (ref 0.44–1.00)
GFR, Estimated: 47 mL/min — ABNORMAL LOW (ref >60)
Glucose, Bld: 100 mg/dL — ABNORMAL HIGH (ref 70–99)
Potassium: 4.3 mmol/L (ref 3.5–5.1)
Sodium: 136 mmol/L (ref 135–145)

## 2022-12-28 NOTE — Progress Notes (Signed)
PROGRESS NOTE   Subjective/Complaints: No new complaints this morning Therapy notes reviewed and had intermittent pain in left forearm over weekend     ROS:     Pt denies SOB, abd pain, CP, N/V/C/D, and vision changes, +intermittent pain in left forewarm   Except for HPI  Objective:   No results found. Recent Labs    12/28/22 0531  WBC 6.7  HGB 9.2*  HCT 29.4*  PLT 273    Recent Labs    12/28/22 0531  NA 136  K 4.3  CL 106  CO2 24  GLUCOSE 100*  BUN 29*  CREATININE 1.16*  CALCIUM 8.1*        Intake/Output Summary (Last 24 hours) at 12/28/2022 0956 Last data filed at 12/28/2022 0730 Gross per 24 hour  Intake 588 ml  Output 1950 ml  Net -1362 ml     Pressure Injury 12/12/22 Sacrum Medial Deep Tissue Pressure Injury - Purple or maroon localized area of discolored intact skin or blood-filled blister due to damage of underlying soft tissue from pressure and/or shear. (Active)  12/12/22 1844  Location: Sacrum  Location Orientation: Medial  Staging: Deep Tissue Pressure Injury - Purple or maroon localized area of discolored intact skin or blood-filled blister due to damage of underlying soft tissue from pressure and/or shear.  Wound Description (Comments):   Present on Admission: Yes     Pressure Injury 12/14/22 Heel Left;Lateral;Posterior Stage 2 -  Partial thickness loss of dermis presenting as a shallow open injury with a red, pink wound bed without slough. (Active)  12/14/22 (first assessed by [facility]) 1221  Location: Heel  Location Orientation: Left;Lateral;Posterior  Staging: Stage 2 -  Partial thickness loss of dermis presenting as a shallow open injury with a red, pink wound bed without slough.  Wound Description (Comments):   Present on Admission: No     Pressure Injury 12/14/22 Heel Right;Lateral Stage 2 -  Partial thickness loss of dermis presenting as a shallow open injury with a red, pink  wound bed without slough. (Active)  12/14/22 (first assessed by [facility]) 1221  Location: Heel  Location Orientation: Right;Lateral  Staging: Stage 2 -  Partial thickness loss of dermis presenting as a shallow open injury with a red, pink wound bed without slough.  Wound Description (Comments):   Present on Admission: No     Pressure Injury 12/14/22 Ischial tuberosity Left;Medial Stage 2 -  Partial thickness loss of dermis presenting as a shallow open injury with a red, pink wound bed without slough. blister (Active)  12/14/22 (first assessed by [facility] nurse) 1221  Location: Ischial tuberosity  Location Orientation: Left;Medial  Staging: Stage 2 -  Partial thickness loss of dermis presenting as a shallow open injury with a red, pink wound bed without slough.  Wound Description (Comments): blister  Present on Admission: No     Physical Exam: Vital Signs Blood pressure (!) 175/65, pulse 74, temperature 97.9 F (36.6 C), resp. rate 17, height 5' (1.524 m), weight 50.2 kg, SpO2 99%.   General: awake, alert, appropriate, laying in bed; tech and PT at bedside; NAD, looks great HENT: conjugate gaze; oropharynx moist CV: regular rate and irregular  rhythm; no JVD Pulmonary: CTAB no w/r/r appreciated, on 2L O2 GI: soft, NT, ND, (+)BS Psychiatric: appropriate- brighter affect Neurological: Ox3 MS; was able to move RLE< but from abd muscles- not in LE muscles- no movement seen in LLE either-  Skin: Groin incision site looks okay, bruising noted bilateral shins 2 IVs in place left upper extremity looks okay Extremities: unable to palpate L radial pulse but dopplers confirm pulse with rate in 60-70s, arm well perfused appearing, warm and pink, motor function preserved. Brachial pulse also unable to be felt. R radial pulse strong and palpable. Neuro:   Mental Status: AAOx3, memory intact,  Speech/Languate:  fluent, follows simple commands CRANIAL NERVES: II: PERRL. Visual fields full III, IV, VI:  EOM intact, no gaze preference or deviation V: normal sensation bilaterally VII: no asymmetry VIII: normal hearing to speech IX, X: normal palatal elevation XI: 5/5 head turn and 5/5 shoulder shrug bilaterally XII: Tongue midline     MOTOR: RUE: 5/5 Deltoid, 5/5 Biceps, 5/5 Triceps,5/5 Grip LUE: 5/5 Deltoid, 5/5 Biceps, 5/5 Triceps, 5/5 Grip RLE: 0 out of 5 throughout LLE: 0 out of 5 throughout, exam stable 11/4   SENSORY: Normal to touch all 4 extremities to light touch in bilateral upper and lower extremities, patchy altered sensation to cold in lower extremities   No ankle clonus bilaterally   Coordination: Normal finger to nose, no dysmetria  Assessment/Plan: 1. Functional deficits which require 3+ hours per day of interdisciplinary therapy in a comprehensive inpatient rehab setting. Physiatrist is providing close team supervision and 24 hour management of active medical problems listed below. Physiatrist and rehab team continue to assess barriers to discharge/monitor patient progress toward functional and medical goals  Care Tool:  Bathing    Body parts bathed by patient: Right arm, Left arm, Chest, Abdomen, Front perineal area, Face, Right upper leg, Left upper leg   Body parts bathed by helper: Right lower leg, Left lower leg, Buttocks     Bathing assist Assist Level: Minimal Assistance - Patient > 75% (sitting in roll in shower chair)     Upper Body Dressing/Undressing Upper body dressing   What is the patient wearing?: Pull over shirt    Upper body assist Assist Level: Minimal Assistance - Patient > 75%    Lower Body Dressing/Undressing Lower body dressing      What is the patient wearing?: Incontinence brief, Pants     Lower body assist Assist for lower body dressing: Total Assistance - Patient < 25%     Toileting Toileting    Toileting assist Assist for toileting: Total Assistance - Patient < 25%     Transfers Chair/bed transfer  Transfers  assist  Chair/bed transfer activity did not occur: Safety/medical concerns  Chair/bed transfer assist level: Moderate Assistance - Patient 50 - 74%     Locomotion Ambulation   Ambulation assist   Ambulation activity did not occur: Safety/medical concerns (paraplegia, fatigue)          Walk 10 feet activity   Assist  Walk 10 feet activity did not occur: Safety/medical concerns (paraplegia, fatigue)        Walk 50 feet activity   Assist Walk 50 feet with 2 turns activity did not occur: Safety/medical concerns (paraplegia, fatigue)         Walk 150 feet activity   Assist Walk 150 feet activity did not occur: Safety/medical concerns (paraplegia, fatigue)         Walk 10 feet on uneven surface  activity   Assist Walk 10 feet on uneven surfaces activity did not occur: Safety/medical concerns (paraplegia, fatigue)         Wheelchair     Assist Is the patient using a wheelchair?: Yes Type of Wheelchair: Power Wheelchair activity did not occur: Safety/medical concerns (paraplegia, fatigue)  Wheelchair assist level: Minimal Assistance - Patient > 75% Max wheelchair distance: 200    Wheelchair 50 feet with 2 turns activity    Assist    Wheelchair 50 feet with 2 turns activity did not occur: Safety/medical concerns (paraplegia, fatigue)   Assist Level: Minimal Assistance - Patient > 75%   Wheelchair 150 feet activity     Assist  Wheelchair 150 feet activity did not occur: Safety/medical concerns (paraplegia, fatigue)   Assist Level: Minimal Assistance - Patient > 75%   Blood pressure (!) 175/65, pulse 74, temperature 97.9 F (36.6 C), resp. rate 17, height 5' (1.524 m), weight 50.2 kg, SpO2 99%.  Medical Problem List and Plan: 1. 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  is 15/7 Will change to PRAFOs from Prevalons 2.  Antithrombotics: -DVT/anticoagulation:  Pharmaceutical: Eliquis 2.5mg  BID             -antiplatelet therapy: Aspirin 81 mg daily   3. Pain Management: continue Tylenol, robaxin, oxycodone as needed 10/24- will wait to try Tramadol for pain/discomfort til have tried  Remeron 7.5mg  for sleep 10/31- stopped Remeron 4. Mood/Behavior/Sleep: LCSW to evaluate and provide emotional support             -continue paroxetine 20 mg daily (hx of depression)             -antipsychotic agents: n/a -12/13/22 hasn't been sleeping well, increase melatonin to 10mg  QHS, added Trazodone 25mg  PRN but advised to be cautious about using it too late.  10/21- will stop Trazodone- can impair ability to pee- will start Restoril 7.5 mg at bedtime for sleep.  10/22- cannot start Remeron due to risk of torsades de pointes- will con't Restoril  10/23- didn't sleep well again- will increase Restoril to 15 mg at bedtime 10/24- spoke to Cards- they are ok trying Remeron but wait on Tramadol-will start 15 mg at bedtime -will stop Restoril>> down to 7.5mg  -12/19/22 slept "wonderfully"! Cont regimen 10/28- Slept poorly- still on rmeron- sounds like every other night she slept well 10/30- sleeping better 10/31- stopped remeron- slept ok yesterday as well  -12/26/22 slept poorly d/t bed she says-- monitor for now 5. Neuropsych/cognition: This patient is capable of making decisions on her own behalf.   6. Skin/Wound Care: Routine skin care checks   7. Fluids/Electrolytes/Nutrition: Routine Is and Os and follow-up chemistries   8: Hypertension: monitor TID and prn (hx of a.fib see meds below). Monitor with mobility. Continue metoprolol 25mg  BID  -12/13/22 BPs a little up, monitor for trend 10/21- BP's running high up to 180s- systolic- but had documented BP 87/64 this AM- will wait to titrate BP meds. 10/22- BP still in 150s- but no LOW BP's  this AM- will see how does in therapy- 10/23- reduced Metoprolol per pt request to 12.5 mg BID-  might need Norvasc for BP control without affecting HR  10/24- will add Norvasc 2.5 mg daily since BP elevated and not coming down  10/25- calling IM- maybe they can help- just added Norvasc yesterday -12/19/22 BPs improving a little this morning, monitor -12/20/22 BPs still a tad elevated but stable, monitor a couple more days before deciding on changes for now 10/28- BP on low side, but Hb low- will transfuse 2 units 10/29- BP looking more labile- but better than it was- monitor for now 11/1: continue current regimen -12/26/22 BPs variable, monitor trend for now Vitals:   12/25/22 0509 12/25/22 1349 12/25/22 2006 12/26/22 0349  BP: (!) 158/66 (!) 132/49 (!) 159/59 (!) 185/71   12/26/22 0836 12/26/22 1357 12/26/22 1907 12/27/22 0424  BP: (!) 181/72 (!) 142/49 (!) 144/52 (!) 161/60   12/27/22 0838 12/27/22 1320 12/27/22 1954 12/28/22 0511  BP: (!) 159/63 (!) 125/46 (!) 150/47 (!) 175/65                 9: Hyperlipidemia: continue pravastatin 40mg  daily   10: COPD, O2 dependent 2L via [place]:             -continue Brovana nebs BID             -continue Yupelri neb daily   10/21- Has been on 2L O2 at home per pt and chart  10/25- ABG shows CO2 too low- needs to reduce O2 levels  10/31- Sats ok- con't O2  11: CAD: stable on asa and statin   12: Atrial fibrillation, paroxysmal: maintained on Eliquis reversed pre-operatively             -continue amiodarone 100 mg daily             -continue metoprolol tartrate 25 mg BID             -cleared by [facility] to re-start Eliquis>pharmacy consult placed   10/25- reduced Metoprolol to 12.5 mg BID earlier in week - doing better  10/30- BP doing better  13: 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 10/22- not voiding -U/A (-)  will add Flomax 0.4 mg q supper to see if she can void- I don't think it's likely. D/w nursing due to low amount of caths-  10/23- pt reports still getting cathed- is better 10/24- pt swears drinking 6 cups/water day, however BUN up to 27 and Cr 1.11- and cath volumes very low- not clear if having incontinence of bladder 10/25- pt said incontinent of bladder as well as cath overnight- monitor -12/19/22 U/A overnight looks infectious, will start Bactrim DS BID x5d, UCx pending; monitor -12/20/22 UCx with >100k CFU of K. Pneumoniae, sensitive to bactrim, cont abx course 10/29- Being cathed- volumes low- pushing fluids, but will also give 1 L IVFs 10/30- not drinking-  Got IVFs but BUN up to 45 and Cr 1.41- will call renal- and recheck in AM-- a little improved on 10/31 labs 17: ABLA: stable; follow CBC trend             -consider oral iron supplement -12/13/22 Hgb slowly downtrending from postop 8.8 on 10/14>7.6>7.5>7.3>7.0 yesterday; pt asymptomatic at this time; would recheck tomorrow but if still at the 7.0-range then would consider transfusion to help with therapy tolerance; unclear what her prior baseline was (has hgb 12.2 back in July but then down to 9's).    10/21- Hb 7.4 today  10/23- Hb 7.2 yesterday- will check in AM 10/24- Hb down to 7.0- will d/w pt if she wants to be transfused. If so, will give 1 unit pRBCs.  Doesn't have location of losing blood- will check FOBT x3  10/25- Hb 8.1- likely too dry  10/28- Hb 6.7- will transfuse 2  units 10/29- Hb much better AND pt feels MUCH better per pt- Hb up to 9.9- likely so high since pt really dry- will also rewrite for FOBT- hasn't been done  10/30- no bowel results- so cannot check FOBT yet.  10/31- had 2 extra large Bms last night- didn't check FOBT_ will order again. Will d/w nursing 11/4: Hgb reviewed and is stable  18: AKI: improving; CMP 12/12/22 showing Cr 1.31; monitor Monday BMPs 10/21- Cr 1.09 down from 1.31 and BUN 25 down from 25- however is drinking poorly and has poor output- 10/23- Says drinking better- will check BMP in AM  10/25- Cr 0.9 and BUN slightly high at 22- but based on Hb increase, likely dry 10/28- Cr 1.4 and BUN elevated- will give blood and recheck BMP in AM 10/29- BUN up to 40 and Cr 1.41- will need to give IVFs- tried seeing if blood would help, but still needs fluids- will her BUN/Cr- will replete 75 cc/hour after therapy today and recheck BMP in AM- of note, her Cr was running ~ 1.0 and her BUN ~ 25- so big change- will push fluids as well- will give 75cc/hour for 1 liter  10/31- likely form Septra- will stop ABX-  11/4: Cr reviewed and is improving  19: 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  10/22- continue to refuse bowel program-in spite of education of bowel program and need for it.  10/23- pt did last night- had BM with bowel program and one this AM, but educated can take 3-6 weeks to train gut, but should be better- had bowel incontinence for 20 years 10/24- no bowel program documented  but had small BM's at midnight and 5am- said wasn't incontinent?will check on this 10/25- asked pt to get more dig stim this evening since likely cause of bowel incontinent in  AM -12/20/22 reports BM this morning but no BM since 12/17/22 documented, no report to nursing staff given; monitor closely but if no BM by tomorrow may need to consider additional adjustments 10/29- LBM yesterday AM, and small with bowel program 10/27-no results last night- if no BM tonight, will give sorbitol/check KUB  10/30- no significant BM for 3 days- will give Sorbitol 30cc at 2pm for bowel program- also add Senna 1 tab daily for meds- 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  -LBM 11/3, continue current regimen  20. Poor Appetite 10/22- will add Megace for poor appetite since cannot add Remeron and too sedated to add Periactin. Will also order push fluids since not drinking great- although her BUN?Cr looking better- BUN 25 and Cr 1.08-  10/23- pt feels she's drinking 6+ cups/day- ate muffin and some eggs this AM- better than last 2 days-  10/24- 25% of tray at most- will add Remeron 15 mg at bedtime- ok'd by Cards.  10/25- changed remeron to 7.5 mg QHS 10/30- won't eat anything for breakfast- small appetite overall-  10/31- ate 1 pancake this AM- better than normal  21. Prolonged QT interval 10/24- spoke with Cards- they suggested starting Remeron 15 mg at bedtime and then rechecking EKG ina few days to see if can add Tramadol. 10/29- doing ok with remeron- wait to add tramadol for now    22. Oversedation/lethargy/ UTI 10/25- septis and ABG work up- when speaking with PA- appeared to have ST elevation- asked them to call IM consult-  -12/19/22 sleepy but arousable today, U/A+, started bactrim as above, awaiting UCx; CT head neg last night, CBC/BMP stable, TSH/T4 WNL, CXR stable; suspect UTI as source. Monitor. See above #16 11/4: sleepy this morning but therapy notes reviewed and she is tolerating therapy better   23. L radial pulse nonpalpable but dopplerable, BPs different in that arm-- unclear when this started, had some pain in L deltoid area yesterday but seemed to  be better today. Suspect this pulse/BP discrepancy is 2/2 her vascular issue already, but reached out to vascular surgery to verify. Dr. Karin Lieu returned page, stated this is known since her surgery; nothing to do or be concerned about.   24. Pulsatile abdominal aorta: not noticed previously, not mentioned in notes, but today 12/20/22 I noticed it was very prominently pulsatile and pt reported it being sore in that area; spoke with Dr. Karin Lieu again of [facility], recommended getting CTA Abd/pelv to evaluate for AAA. 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

## 2022-12-28 NOTE — Progress Notes (Signed)
Occupational Therapy Weekly Progress Note  Patient Details  Name: Heather Molina MRN: [id number] Date of Birth: [birth year]  Beginning of progress report period: December 21, 2022 End of progress report period: December 28, 2022  Today's Date: 12/28/2022 OT Individual Time: 1100-1206 & 1345-1430 OT Individual Time Calculation (min): 66 min & 45 min   Patient has met 1 of 2 short term goals.  Pt is progressing toward commode transfer goal. Pt demonstrating progress in static/dynamic sitting balance, ADLs, bed mobility, PWC control and transfers. Pt with scheduled W/C evaluation 11/5 and family education meeting 11/7.  Patient continues to demonstrate the following deficits: muscle weakness and muscle paralysis, decreased cardiorespiratoy endurance, unbalanced muscle activation, and decreased sitting balance, decreased postural control, and decreased balance strategies and therefore will continue to benefit from skilled OT intervention to enhance overall performance with BADL and Reduce care partner burden.  Patient progressing toward long term goals..  Continue plan of care.  OT Short Term Goals Week 1:  OT Short Term Goal 1 (Week 1): Pt will complete sitting EOB with Mod A to complete ADL task OT Short Term Goal 1 - Progress (Week 1): Met OT Short Term Goal 2 (Week 1): Pt will complete LB dressing with Max A OT Short Term Goal 2 - Progress (Week 1): Met OT Short Term Goal 3 (Week 1): Pt will complete UB dressing with Mod A OT Short Term Goal 3 - Progress (Week 1): Met Week 2:  OT Short Term Goal 1 (Week 2): Pt will complete commode transfer Max A with transfer board OT Short Term Goal 1 - Progress (Week 2): Progressing toward goal OT Short Term Goal 2 (Week 2): Pt will complete pressure relief without cues 25% of time throughout sessions OT Short Term Goal 2 - Progress (Week 2): Met Week 3:  OT Short Term Goal 1 (Week 3): Pt will complete commode transfer with Mod A OT Short Term Goal 2 (Week  3): Pt will complete static sitting EOM or EOB with consistent SBA  Skilled Therapeutic Interventions/Progress Updates:      Therapy Documentation Precautions:  Precautions Precautions: Fall Precaution Comments: paraplegia Restrictions Weight Bearing Restrictions: No Session 1 General: "I am more tired today." Pt seated in W/C upon OT arrival, agreeable to OT.  Pain:  low unrated pain reported in Lt forearm, activity, intermittent rest breaks, distractions provided for pain management, pt reports tolerable to proceed.    ADL: Transfers: Min A W/C>bed with scooting method, pt CGA until end of transfer needing Min A to adjust self onto bed  Exercises: Pt completed 10 minutes of arm bike, 5 min forward, 5 min backward in order to increase BUE/BLEstrength and endurance in preparation for increased independence in ADLs such as bathing. PRN rest breaks, on moderate resistance.   W/C mobility: Pt maneuvering PWN throughout hallways working on maintaining increased speed while maintaining control and maneuvering around obstacles. Pt completed with Min A for tight spaces. Pt educated on decreasing speed for tight spaces or in room.    Other Treatments: Pt and OT discussed bowel program and completing in bed versus DABSC. Pt educated on benefits of each. Pt reports difficulty with coughing, pt educated on "quad cough" technique in order to complete more productive cough.    Pt supine in bed with bed alarm activated, 2 bed rails up, call light within reach and 4Ps assessed.   Session 2 General: "I love these cookies you have to try one." Pt supine in bed upon  OT arrival, agreeable to OT session.  Pain: no pain reported  Exercises: Pt issued UE theraband HEP in order to increase functional strength, andurance and activity tolerance in order to increase independence in ADLs such as bathing. Pt issued yellow theraband and discussed direction/technique of exercises, demonstrating verbal  understanding. Pt completed 3x10 exercises at bed level listed below: -elbow extensions - shoulder horizontal abduction -bicep curls  -shoulder flexion -diagonal shoulder flexion -external rotation   Pt supine in bed with bed alarm activated, 2 bed rails up, call light within reach and 4Ps assessed.   Therapy/Group: Individual Therapy  [name], OTD, OTR/L 12/28/2022, 3:46 PM

## 2022-12-28 NOTE — Progress Notes (Addendum)
Physical Therapy Session Note  Patient Details  Name: Heather Molina MRN: [id number] Date of Birth: [birth year]  Today's Date: 12/28/2022 PT Individual Time: 0903-1000 PT Individual Time Calculation (min): 57 min   Short Term Goals: Week 2:  PT Short Term Goal 1 (Week 2): Pt will complete WC mobility in PWC with supervision 150' navigating busy hallways PT Short Term Goal 2 (Week 2): Pt will require mod assist for supine>sit PT Short Term Goal 3 (Week 2): Pt will tolerate sitting balance on firm surface CGA x1 min PT Short Term Goal 4 (Week 2): Pt will complete SB transfer with mod assist  Skilled Therapeutic Interventions/Progress Updates:      Therapy Documentation Precautions:  Precautions Precautions: Fall Precaution Comments: paraplegia Restrictions Weight Bearing Restrictions: No   Pt agreeable to PT session with emphasis on transfer training and static/dynamic sitting balance. Pt without reports of pain and dependent for lower body dressing and min A for upper body dressing. Pt largely min A for slide board transfers x 3 with one episode of posterior imbalance requiring mod A for recovery. Pt engaged in trunk control, postural control and NMRed activities through reverse sit ups and contralateral reaching for cones in various planes to address balance impairments to better transfers. On return inclined slide board transfer to w/c pt displayed improved dynamic sitting balance in conjunction of use of leg loops for stabilization. Pt min A - supervision with PWC mobility in session and left seated at bedside with all needs in reach.    Therapy/Group: Individual Therapy  Truitt Leep Truitt Leep PT, DPT  12/28/2022, 7:53 AM

## 2022-12-29 MED ORDER — AMLODIPINE BESYLATE 2.5 MG PO TABS
2.5000 mg | ORAL_TABLET | Freq: Once | ORAL | Status: AC
  Administered 2022-12-29: 2.5 mg via ORAL
  Filled 2022-12-29: qty 1

## 2022-12-29 MED ORDER — AMLODIPINE BESYLATE 5 MG PO TABS
5.0000 mg | ORAL_TABLET | Freq: Every day | ORAL | Status: DC
  Administered 2022-12-30 – 2023-01-12 (×14): 5 mg via ORAL
  Filled 2022-12-29 (×14): qty 1

## 2022-12-29 NOTE — Progress Notes (Signed)
IP Rehab Bowel Program Documentation   Bowel Program Start time 2000   Dig Stim Indicated? Yes  Dig Stim Prior to Suppository or mini Enema X 2   Output from dig stim: None  Ordered intervention: Suppository Yes , mini enema No ,   Repeat dig stim after Suppository or Mini enema  X 3,  Output? Moderate   Bowel Program Complete? Yes , handoff given yes  Patient Tolerated? Yes   Dominica Severin......................RN

## 2022-12-29 NOTE — Progress Notes (Signed)
Physical Therapy Session Note  Patient Details  Name: Heather Molina MRN: [id number] Date of Birth: [birth year]  Today's Date: 12/29/2022 PT Individual Time: 1345-1415 PT Individual Time Calculation (min): 30 min   Short Term Goals: Week 2:  PT Short Term Goal 1 (Week 2): Pt will complete WC mobility in PWC with supervision 150' navigating busy hallways PT Short Term Goal 2 (Week 2): Pt will require mod assist for supine>sit PT Short Term Goal 3 (Week 2): Pt will tolerate sitting balance on firm surface CGA x1 min PT Short Term Goal 4 (Week 2): Pt will complete SB transfer with mod assist  Skilled Therapeutic Interventions/Progress Updates:      Therapy Documentation Precautions:  Precautions Precautions: Fall Precaution Comments: paraplegia Restrictions Weight Bearing Restrictions: No  Pt agreeable to PT with emphasis on increasing functional independence with bed mobility and transfer training with slide board. PT observed bleeding from L shin and ankle, nurse notified and PT applied appropriate bandage. Pt without complaints of pain. Pt CGA with supine>sit with HOB elevated and requires increased time for management of legs with LE loops. Pt min A for balance with slide board transfer to [facility] with increased integration of LE leg loops. Pt left seated at bedside with all needs in reach.    Therapy/Group: Individual Therapy  Truitt Leep Truitt Leep PT, DPT  12/29/2022, 7:51 AM

## 2022-12-29 NOTE — Progress Notes (Signed)
Occupational Therapy Session Note  Patient Details  Name: Heather Molina MRN: [id number] Date of Birth: [birth year]  Today's Date: 12/29/2022 OT Individual Time: 8001-0920 & 1000-1015 Co-Tx time: 1015-1030 with Gaye Pollack PT OT Individual Time Calculation (min): 79 min & 15 min    Short Term Goals: Week 2:  OT Short Term Goal 1 (Week 2): Pt will complete commode transfer Max A with transfer board OT Short Term Goal 1 - Progress (Week 2): Progressing toward goal OT Short Term Goal 2 (Week 2): Pt will complete pressure relief without cues 25% of time throughout sessions OT Short Term Goal 2 - Progress (Week 2): Met Week 3:  OT Short Term Goal 1 (Week 3): Pt will complete commode transfer with Mod A OT Short Term Goal 2 (Week 3): Pt will complete static sitting EOM or EOB with consistent SBA  Skilled Therapeutic Interventions/Progress Updates:      Therapy Documentation Precautions:  Precautions Precautions: Fall Precaution Comments: paraplegia Restrictions Weight Bearing Restrictions: No Session 1 General: "I feel so much better." Pt supine in bed upon OT arrival, agreeable to OT session.  Pain: no pain reported  ADL: Bed mobility: Min A with use of bed rails Grooming: SBA seated at sink in W/C for hair care Oral hygiene: SBA seated at sink in W/C, able to anterior lean in order to retrieve items Toileting: Pt found incontinent of bowel, total A for toileting UB dressing: SBA seated in W/C for overhead shirt donning/doffing LB dressing: total A for time constraints at bed level for doffing/donning pants/brief Footwear: total A at bed level for shoes Shower transfer: total A, using roll in shower chair Bathing: Min A using long handled sponge, assistance with buttocks and feet   Pt supine in bed with bed alarm activated, 2 bed rails up, call light within reach and 4Ps assessed. Direct handoff to nursing for wound dressing change.   Session 2 General: "I am comfortable in  this chair." Pt supine in bed upon OT arrival, agreeable to OT session.  Pain: no pain reported  ADL: Transfers: total A for time constraints EOB><PWC    Other Treatments: Pt, Demetra Shiner PT and Huntley Dec OT present for custom W/C evaluation in order to increase functional mobility in order to complete ADLs and community integration. Pt will be receiving the following chair below:   W/C features: midwheel drive with tilt and power seat elevator with ROHO hybrid cushion and gel W/C arms and footrest gel overlays for skin integrity and 4 inch contour back for lateral support with carrot driving joy stick   Pt seated in W/C at end of session with W/C alarm donned, call light within reach and 4Ps assessed. Direct handoff to PT for treatment    Therapy/Group: Individual Therapy  [name], OTD, OTR/L 12/29/2022, 12:36 PM

## 2022-12-29 NOTE — Progress Notes (Signed)
Physical Therapy Session Note  Patient Details  Name: Heather Molina MRN: [id number] Date of Birth: June 04, 1940  Today's Date: 12/29/2022 PT Individual Time: 1030-1100 PT Individual Time Calculation (min): 30 min  and Today's Date: 12/29/2022 PT Co-Treatment Time: 1015-1030 PT Co-Treatment Time Calculation (min): 15 min  Short Term Goals: Week 2:  PT Short Term Goal 1 (Week 2): Pt will complete WC mobility in PWC with supervision 150' navigating busy hallways PT Short Term Goal 2 (Week 2): Pt will require mod assist for supine>sit PT Short Term Goal 3 (Week 2): Pt will tolerate sitting balance on firm surface CGA x1 min PT Short Term Goal 4 (Week 2): Pt will complete SB transfer with mod assist  Skilled Therapeutic Interventions/Progress Updates:      Therapy Documentation Precautions:  Precautions Precautions: Fall Precaution Comments: paraplegia Restrictions Weight Bearing Restrictions: No Co-Treatment   ATP, PT, and OT present for power wheelchair evaluation. PT provided recommendation for wheelchair speculations based on pt's range of motion, strength, tone, spasticity, trunk control, and postural control impairments. Pt will benefit from recommended from a power wheelchair with following features:   Mid wheel drive  Tilt and Power Seat Elevator  Roho hybrid cushion  4 inch contour back  Gel arms  Regular headrest  Footrest gel overlays  Pride carrot joystick knob   No pain   Individual Treatment   Pt declines pain and PT assessed posture, balance, strength, and sensation for PWC LMN documentation. Pt min A to supervision for w/c mobility to return to room and left seated at bedside with all needs in reach.   Therapy/Group: Co-Treatment  Priscille Kluver PT, DPT  12/29/2022, 7:52 AM

## 2022-12-29 NOTE — Patient Care Conference (Signed)
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 Genice Rouge Social Worker Present: Dossie Der, LCSW Nurse Present: Vedia Pereyra, RN PT Present: Truitt Leep, PT OT Present: [name], OT PPS Coordinator present : Fae Pippin, SLP     Current Status/Progress Goal Weekly Team Focus  Bowel/Bladder   i/o cath Q6hr; bowel program  *** gain regular bowel pattern   assist with toileting needs prn    Swallow/Nutrition/ Hydration      ***         ADL's   grooming/oral hygiene:, SBA, UB dressing: SBA, LB dressing: Mod A, footwear: total A, tansfers: Min A, bathing: Min A with roll in shower chair  *** Min A   LB ADL retraining, dynamic sitting balance, community integration, commode transfers    Mobility   mod bed mobility, min/mod slide board transfer, min to supervision PWC propulsion  *** min A, PWC mod I  PWC evaluation, balance with transfer training to increase independence    Communication      ***          Safety/Cognition/ Behavioral Observations     ***          Pain   no c/o pain  *** remain pain free   assess pain QS and prn    Skin   stage 2 on saccrum and bil heels, foams in place; LUE skin tear, dressign order  *** remain  free of new skin breakdown/infection; continue dressing changes per MD orders  assess skin QS and prn      Discharge Planning:  Pt's children very close and main caregiver-[name] she is here daily and provides strong support. Family conference Thursday at 10:00   Team Discussion: *** Patient on target to meet rehab goals: {IP REHAB YES/NO WITH [id number]}  *See Care Plan and progress notes for long and short-term goals.   Revisions to Treatment Plan:  ***  Teaching Needs: ***  Current Barriers to Discharge: {BARRIERS TO [id number]:84696}  Possible Resolutions to Barriers: ***     Medical Summary Current Status: need occult stools- pending- to work on cathing this week and bowel program next week- on bowel/bladder program- being cathed-  Barriers to Discharge: Other (comments);Weight bearing restrictions;Complicated Wound;Inadequate Nutritional Intake;Renal Insufficiency/Failure;Self-care education;Neurogenic Bowel & Bladder;Incontinence;Oxygen Requirement  Barriers to Discharge Comments: limited by frailty- many skin tears- stage II buttocks/DTI- B/L heels stage II; O2 by [place]- chronic- transfer board being used- Possible Resolutions to [employer]: increased norvasc for elevated BP to 5 mg daily-  BUN?Cr doing better- push fluids still- 15/7- call daughter about cathing- if daughter/pt does it- d/c- 11/19   Continued Need for Acute Rehabilitation Level of Care: The patient requires daily medical management by a physician with specialized training in physical medicine  and rehabilitation for the following reasons: Direction of a multidisciplinary physical rehabilitation program to maximize functional independence : Yes Medical management of patient stability for increased activity during participation in an intensive rehabilitation regime.: Yes Analysis of laboratory values and/or radiology reports with any subsequent need for medication adjustment and/or medical  intervention. : Yes   I attest that I was present, lead the team conference, and concur with the assessment and plan of the team.   Jearld Adjutant 12/29/2022, 5:14 PM

## 2022-12-29 NOTE — Progress Notes (Addendum)
PROGRESS NOTE   Subjective/Complaints:  Pt reports LBM yesterday , but doesn't think was with bowel program. BM had at midnight- after program.  Says daughter plans to cath at home after d/c. Caths going well- volumes running 200s-1000--   Getting breathing treatment-     ROS:    Pt denies SOB, abd pain, CP, N/V/C/D, and vision changes , +intermittent pain in left forewarm   Except for HPI  Objective:   No results found. Recent Labs    12/28/22 0531  WBC 6.7  HGB 9.2*  HCT 29.4*  PLT 273    Recent Labs    12/28/22 0531  NA 136  K 4.3  CL 106  CO2 24  GLUCOSE 100*  BUN 29*  CREATININE 1.16*  CALCIUM 8.1*        Intake/Output Summary (Last 24 hours) at 12/29/2022 0909 Last data filed at 12/29/2022 0650 Gross per 24 hour  Intake 355 ml  Output 1875 ml  Net -1520 ml     Pressure Injury 12/12/22 Sacrum Medial Deep Tissue Pressure Injury - Purple or maroon localized area of discolored intact skin or blood-filled blister due to damage of underlying soft tissue from pressure and/or shear. (Active)  12/12/22 1844  Location: Sacrum  Location Orientation: Medial  Staging: Deep Tissue Pressure Injury - Purple or maroon localized area of discolored intact skin or blood-filled blister due to damage of underlying soft tissue from pressure and/or shear.  Wound Description (Comments):   Present on Admission: Yes     Pressure Injury 12/14/22 Heel Left;Lateral;Posterior Stage 2 -  Partial thickness loss of dermis presenting as a shallow open injury with a red, pink wound bed without slough. (Active)  12/14/22 (first assessed by [facility]) 1221  Location: Heel  Location Orientation: Left;Lateral;Posterior  Staging: Stage 2 -  Partial thickness loss of dermis presenting as a shallow open injury with a red, pink wound bed without slough.  Wound Description (Comments):   Present on Admission: No     Pressure Injury  12/14/22 Heel Right;Lateral Stage 2 -  Partial thickness loss of dermis presenting as a shallow open injury with a red, pink wound bed without slough. (Active)  12/14/22 (first assessed by [facility]) 1221  Location: Heel  Location Orientation: Right;Lateral  Staging: Stage 2 -  Partial thickness loss of dermis presenting as a shallow open injury with a red, pink wound bed without slough.  Wound Description (Comments):   Present on Admission: No     Pressure Injury 12/14/22 Ischial tuberosity Left;Medial Stage 2 -  Partial thickness loss of dermis presenting as a shallow open injury with a red, pink wound bed without slough. blister (Active)  12/14/22 (first assessed by [facility] nurse) 1221  Location: Ischial tuberosity  Location Orientation: Left;Medial  Staging: Stage 2 -  Partial thickness loss of dermis presenting as a shallow open injury with a red, pink wound bed without slough.  Wound Description (Comments): blister  Present on Admission: No     Physical Exam: Vital Signs Blood pressure (!) 137/91, pulse 76, temperature 98.4 F (36.9 C), resp. rate 16, height 5' (1.524 m), weight 50.2 kg, SpO2 100%.    General:  awake, alert, appropriate, frail, sitting up in bed getting breathing tx; NAD HENT: conjugate gaze; oropharynx moist CV: regular rate and irregular rhythm; no JVD Pulmonary: good air movement- hard ot hear over breathing treatment GI: soft, NT, ND, (+)BS- slightly hypoactive Psychiatric: appropriate- brighter, more interactive  Neurological: Ox3  MS; was able to move RLE< but from abd muscles- not in LE muscles- no movement seen in LLE either-  Skin: Groin incision site looks okay, bruising noted bilateral shins 2 IVs in place left upper extremity looks okay Extremities: unable to palpate L radial pulse but dopplers confirm pulse with rate in 60-70s, arm well perfused appearing, warm and pink, motor function preserved. Brachial pulse also unable to be felt. R radial pulse strong  and palpable. Neuro:   Mental Status: AAOx3, memory intact,  Speech/Languate:  fluent, follows simple commands CRANIAL NERVES: II: PERRL. Visual fields full III, IV, VI: EOM intact, no gaze preference or deviation V: normal sensation bilaterally VII: no asymmetry VIII: normal hearing to speech IX, X: normal palatal elevation XI: 5/5 head turn and 5/5 shoulder shrug bilaterally XII: Tongue midline     MOTOR: RUE: 5/5 Deltoid, 5/5 Biceps, 5/5 Triceps,5/5 Grip LUE: 5/5 Deltoid, 5/5 Biceps, 5/5 Triceps, 5/5 Grip RLE: 0 out of 5 throughout LLE: 0 out of 5 throughout, exam stable 11/4   SENSORY: Normal to touch all 4 extremities to light touch in bilateral upper and lower extremities, patchy altered sensation to cold in lower extremities   No ankle clonus bilaterally   Coordination: Normal finger to nose, no dysmetria  Assessment/Plan: 1. Functional deficits which require 3+ hours per day of interdisciplinary therapy in a comprehensive inpatient rehab setting. Physiatrist is providing close team supervision and 24 hour management of active medical problems listed below. Physiatrist and rehab team continue to assess barriers to discharge/monitor patient progress toward functional and medical goals  Care Tool:  Bathing    Body parts bathed by patient: Right arm, Left arm, Chest, Abdomen, Front perineal area, Face, Right upper leg, Left upper leg   Body parts bathed by helper: Right lower leg, Left lower leg, Buttocks     Bathing assist Assist Level: Minimal Assistance - Patient > 75% (roll in shower chair)     Upper Body Dressing/Undressing Upper body dressing   What is the patient wearing?: Pull over shirt    Upper body assist Assist Level: Supervision/Verbal cueing    Lower Body Dressing/Undressing Lower body dressing      What is the patient wearing?: Incontinence brief, Pants     Lower body assist Assist for lower body dressing: Maximal Assistance - Patient 25  - 49%     Toileting Toileting    Toileting assist Assist for toileting: Total Assistance - Patient < 25%     Transfers Chair/bed transfer  Transfers assist  Chair/bed transfer activity did not occur: Safety/medical concerns  Chair/bed transfer assist level: Minimal Assistance - Patient > 75%     Locomotion Ambulation   Ambulation assist   Ambulation activity did not occur: Safety/medical concerns (paraplegia, fatigue)          Walk 10 feet activity   Assist  Walk 10 feet activity did not occur: Safety/medical concerns (paraplegia, fatigue)        Walk 50 feet activity   Assist Walk 50 feet with 2 turns activity did not occur: Safety/medical concerns (paraplegia, fatigue)         Walk 150 feet activity   Assist Walk 150 feet  activity did not occur: Safety/medical concerns (paraplegia, fatigue)         Walk 10 feet on uneven surface  activity   Assist Walk 10 feet on uneven surfaces activity did not occur: Safety/medical concerns (paraplegia, fatigue)         Wheelchair     Assist Is the patient using a wheelchair?: Yes Type of Wheelchair: Power Wheelchair activity did not occur: Safety/medical concerns (paraplegia, fatigue)  Wheelchair assist level: Minimal Assistance - Patient > 75% Max wheelchair distance: 200    Wheelchair 50 feet with 2 turns activity    Assist    Wheelchair 50 feet with 2 turns activity did not occur: Safety/medical concerns (paraplegia, fatigue)   Assist Level: Minimal Assistance - Patient > 75%   Wheelchair 150 feet activity     Assist  Wheelchair 150 feet activity did not occur: Safety/medical concerns (paraplegia, fatigue)   Assist Level: Minimal Assistance - Patient > 75%   Blood pressure (!) 137/91, pulse 76, temperature 98.4 F (36.9 C), resp. rate 16, height 5' (1.524 m), weight 50.2 kg, SpO2 100%.  Medical Problem List and Plan: 1. 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  Antithrombotics: -DVT/anticoagulation:  Pharmaceutical: Eliquis 2.5mg  BID             -antiplatelet therapy: Aspirin 81 mg daily   3. Pain Management: continue Tylenol, robaxin, oxycodone as needed 10/24- will wait to try Tramadol for pain/discomfort til have tried  Remeron 7.5mg  for sleep 10/31- stopped Remeron 4. Mood/Behavior/Sleep: LCSW to evaluate and provide emotional support             -continue paroxetine 20 mg daily (hx of depression)             -antipsychotic agents: n/a -12/13/22 hasn't been sleeping well, increase melatonin to 10mg  QHS, added Trazodone 25mg  PRN but advised to be cautious about using it too late.  10/21- will stop Trazodone- can impair ability to pee- will start Restoril 7.5 mg at bedtime for sleep.  10/22- cannot start Remeron due to risk of torsades de pointes- will con't Restoril  10/23- didn't sleep well again- will increase Restoril to 15 mg at bedtime 10/24- spoke to Cards- they are ok trying Remeron but wait on Tramadol-will start 15 mg at bedtime -will stop Restoril>> down to 7.5mg  -12/19/22 slept "wonderfully"! Cont regimen 10/28- Slept poorly- still on rmeron- sounds like every other night she slept well 10/30- sleeping better 10/31- stopped remeron- slept ok yesterday as well  11/5- slept fair, not great 5. Neuropsych/cognition: This patient is capable of making decisions on her own behalf.   6. Skin/Wound Care: Routine skin care checks   7. Fluids/Electrolytes/Nutrition: Routine Is and Os and follow-up chemistries   8: Hypertension: monitor TID and prn (hx of a.fib see meds below). Monitor with mobility. Continue metoprolol 25mg  BID  -12/13/22 BPs a little up, monitor for trend 10/21- BP's running high up to 180s-  systolic- but had documented BP 87/64 this AM- will wait to titrate BP meds. 10/22- BP still in 150s- but no LOW BP's this AM- will see  how does in therapy- 10/23- reduced Metoprolol per pt request to 12.5 mg BID- might need Norvasc for BP control without affecting HR  10/24- will add Norvasc 2.5 mg daily since BP elevated and not coming down  10/25- calling IM- maybe they can help- just added Norvasc yesterday -12/19/22 BPs improving a little this morning, monitor -12/20/22 BPs still a tad elevated but stable, monitor a couple more days before deciding on changes for now 10/28- BP on low side, but Hb low- will transfuse 2 units 10/29- BP looking more labile- but better than it was- monitor for now 11/1: continue current regimen -12/26/22 BPs variable, monitor trend for now 11/5- BP running 130s- 170's- Will increase Norvasc to 5 mg daily  Vitals:   12/26/22 0349 12/26/22 0836 12/26/22 1357 12/26/22 1907  BP: (!) 185/71 (!) 181/72 (!) 142/49 (!) 144/52   12/27/22 0424 12/27/22 0838 12/27/22 1320 12/27/22 1954  BP: (!) 161/60 (!) 159/63 (!) 125/46 (!) 150/47   12/28/22 0511 12/28/22 1321 12/28/22 1434 12/28/22 1948  BP: (!) 175/65 (!) 157/56 (!) 156/64 (!) 137/91                 9: Hyperlipidemia: continue pravastatin 40mg  daily   10: COPD, O2 dependent 2L via [place]:             -continue Brovana nebs BID             -continue Yupelri neb daily   10/21- Has been on 2L O2 at home per pt and chart  10/25- ABG shows CO2 too low- needs to reduce O2 levels  10/31- Sats ok- con't O2  11: CAD: stable on asa and statin   12: Atrial fibrillation, paroxysmal: maintained on Eliquis reversed pre-operatively             -continue amiodarone 100 mg daily             -continue metoprolol tartrate 25 mg BID             -cleared by [facility] to re-start Eliquis>pharmacy consult placed   10/25- reduced Metoprolol to 12.5 mg BID earlier in week - doing better  10/30- BP doing better  13: 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 10/22- not voiding -U/A (-)  will add Flomax 0.4 mg q supper to see if she can void- I don't think it's likely. D/w nursing due to low amount of caths-  -12/19/22 U/A overnight looks infectious, will start Bactrim DS BID x5d, UCx pending; monitor -12/20/22 UCx with >100k CFU of K. Pneumoniae, sensitive to bactrim, cont abx course 10/29- Being cathed- volumes low- pushing fluids, but will also give 1 L IVFs 10/30- not drinking- Got IVFs but BUN up to 45 and Cr 1.41- will call renal- and recheck in AM-- a little improved  on 10/31 labs 11/5- drinking much better- volumes looking better- and Cr down to 1.16 and BUN 29 from 40's 17: ABLA: stable; follow CBC trend             -consider oral iron supplement -12/13/22 Hgb slowly downtrending from postop 8.8 on 10/14>7.6>7.5>7.3>7.0 yesterday; pt asymptomatic at this time; would recheck tomorrow but if still at the 7.0-range then would consider transfusion to help with therapy tolerance; unclear what her prior baseline was (has hgb 12.2 back in July but then down to 9's).    10/21- Hb 7.4 today  10/23- Hb 7.2 yesterday- will check in AM 10/24- Hb down to 7.0- will d/w pt if she wants to be transfused. If so, will give 1 unit pRBCs.  Doesn't have location of losing blood- will check FOBT x3  10/25- Hb 8.1- likely too dry  10/28- Hb 6.7- will  transfuse 2 units 10/29- Hb much better AND pt feels MUCH better per pt- Hb up to 9.9- likely so high since pt really dry- will also rewrite for FOBT- hasn't been done  10/30- no bowel results- so cannot check FOBT yet.  10/31- had 2 extra large Bms last night- didn't check FOBT_ will order again. Will d/w nursing 11/4: Hgb reviewed and is stable 11/5- Hb 9.2 18: AKI: improving; CMP 12/12/22 showing Cr 1.31; monitor Monday BMPs 10/21- Cr 1.09 down from 1.31 and BUN 25 down from 25- however is drinking poorly and has poor output- 10/23- Says drinking better- will check BMP in AM  10/25- Cr 0.9 and BUN slightly high at 22- but based on Hb increase, likely dry 10/28- Cr 1.4 and BUN elevated- will give blood and recheck BMP in AM 10/29- BUN up to 40 and Cr 1.41- will need to give IVFs- tried seeing if blood would help, but still needs fluids- will her BUN/Cr- will replete 75 cc/hour after therapy today and recheck BMP in AM- of note, her Cr was running ~ 1.0 and her BUN ~ 25- so big change- will push fluids as well- will give 75cc/hour for 1 liter  10/31- likely form Septra- will stop ABX-  11/4: Cr reviewed and is improving 11/5- Cr 1.16 and BUN 29 down from 40's 19: 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  10/22- continue to refuse bowel program-in spite of education of bowel program and need for it.  10/23- pt did last night- had BM with bowel program and one this AM, but educated can take 3-6 weeks to train gut, but should be better- had bowel incontinence for 20 years 10/24- no bowel program documented  but had small BM's at midnight and 5am- said wasn't incontinent?will check on this 10/25- asked pt to get more  dig stim this evening since likely cause of bowel incontinent in AM -12/20/22 reports BM this morning but no BM since 12/17/22 documented, no report to nursing staff given; monitor closely but if no BM by tomorrow may need to consider additional adjustments 10/29- LBM yesterday AM, and small with bowel program 10/27-no results last night- if no BM tonight, will give sorbitol/check KUB  10/30- no significant BM for 3 days- will give Sorbitol 30cc at 2pm for bowel program- also add Senna 1 tab  daily for meds- 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  -LBM 11/3, continue current regimen 11/5- LBM at midnight after bowel program 20. Poor Appetite 10/22- will add Megace for poor appetite since cannot add Remeron and too sedated to add Periactin. Will also order push fluids since not drinking great- although her BUN?Cr looking better- BUN 25 and Cr 1.08-  10/23- pt feels she's drinking 6+ cups/day- ate muffin and some eggs this AM- better than last 2 days-  10/24- 25% of tray at most- will add Remeron 15 mg at bedtime- ok'd by Cards.  10/25- changed remeron to 7.5 mg QHS 10/30- won't eat anything for breakfast- small appetite overall-  10/31- ate 1 pancake this AM- better than normal  21. Prolonged QT interval 10/24- spoke with Cards- they suggested starting Remeron 15 mg at bedtime and then rechecking EKG ina few days to see if can add Tramadol. 10/29- doing ok with remeron- wait to add tramadol for now    22. Oversedation/lethargy/ UTI 10/25- septis and ABG work up- when speaking with PA- appeared to have ST elevation- asked them to call IM consult-  -12/19/22 sleepy but arousable today, U/A+, started bactrim as above, awaiting UCx; CT head neg last night, CBC/BMP stable, TSH/T4 WNL, CXR stable; suspect UTI as source. Monitor. See above #16 11/4: sleepy this morning but therapy notes reviewed and she is tolerating therapy better   23. L radial pulse nonpalpable but dopplerable, BPs  different in that arm-- unclear when this started, had some pain in L deltoid area yesterday but seemed to be better today. Suspect this pulse/BP discrepancy is 2/2 her vascular issue already, but reached out to vascular surgery to verify. Dr. Karin Lieu returned page, stated this is known since her surgery; nothing to do or be concerned about.   24. Pulsatile abdominal aorta: not noticed previously, not mentioned in notes, but today 12/20/22 I noticed it was very prominently pulsatile and pt reported it being sore in that area; spoke with Dr. Karin Lieu again of [facility], recommended getting CTA Abd/pelv to evaluate for AAA. 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   LOS: 17 days A FACE TO FACE EVALUATION WAS PERFORMED  Sita Mangen 12/29/2022, 9:09 AM

## 2022-12-29 NOTE — Progress Notes (Signed)
IP Rehab Bowel Program Documentation   Bowel Program Start time 2032   Dig Stim Indicated? Yes  Dig Stim Prior to Suppository or mini Enema X 2   Output from dig stim:   Ordered intervention: Suppository Yes , mini enema No ,   Repeat dig stim after Suppository or Mini enema  X 1,  Output? Moderate   Bowel Program Complete? Yes , handoff given yes   Patient Tolerated? Yes

## 2022-12-29 NOTE — Progress Notes (Signed)
Patient ID: Heather Molina, female   DOB: 1940/12/24, 82 y.o.   MRN: [id number]  Met with pt and spoke with Heather Molina-daughter to give both of them team conference update regarding progress this week in her therapies. Daughter is aware she will need to learn how to I & O cath mom due to mom's inability to reach and to sit up by herself. Have re-scheduled the family meeting to next Thursday at 10:00 due to MD will not be here Wed-Friday of this week. Daughter and pt were fine with this. Heather Molina aware MD to call her to touch base with her. Continue to work on discharge needs.

## 2022-12-30 DIAGNOSIS — L8990 Pressure ulcer of unspecified site, unspecified stage: Secondary | ICD-10-CM | POA: Insufficient documentation

## 2022-12-30 DIAGNOSIS — R001 Bradycardia, unspecified: Secondary | ICD-10-CM

## 2022-12-30 HISTORY — DX: Bradycardia, unspecified: R00.1

## 2022-12-30 LAB — BASIC METABOLIC PANEL
Anion gap: 9 (ref 5–15)
BUN: 42 mg/dL — ABNORMAL HIGH (ref 8–23)
CO2: 21 mmol/L — ABNORMAL LOW (ref 22–32)
Calcium: 8.2 mg/dL — ABNORMAL LOW (ref 8.9–10.3)
Chloride: 106 mmol/L (ref 98–111)
Creatinine, Ser: 1.26 mg/dL — ABNORMAL HIGH (ref 0.44–1.00)
GFR, Estimated: 42 mL/min — ABNORMAL LOW (ref >60)
Glucose, Bld: 103 mg/dL — ABNORMAL HIGH (ref 70–99)
Potassium: 4.6 mmol/L (ref 3.5–5.1)
Sodium: 136 mmol/L (ref 135–145)

## 2022-12-30 LAB — MAGNESIUM: Magnesium: 2.5 mg/dL — ABNORMAL HIGH (ref 1.7–2.4)

## 2022-12-30 LAB — TROPONIN I (HIGH SENSITIVITY)
Troponin I (High Sensitivity): 29 ng/L — ABNORMAL HIGH (ref <18)
Troponin I (High Sensitivity): 36 ng/L — ABNORMAL HIGH (ref <18)

## 2022-12-30 MED ORDER — AMIODARONE HCL 200 MG PO TABS
100.0000 mg | ORAL_TABLET | Freq: Every day | ORAL | Status: DC
  Administered 2022-12-31 – 2023-01-12 (×13): 100 mg via ORAL
  Filled 2022-12-30 (×13): qty 1

## 2022-12-30 MED ORDER — NITROGLYCERIN 0.4 MG SL SUBL
0.4000 mg | SUBLINGUAL_TABLET | SUBLINGUAL | Status: DC | PRN
  Filled 2022-12-30: qty 1

## 2022-12-30 MED ORDER — METOPROLOL TARTRATE 25 MG/10 ML ORAL SUSPENSION
6.2500 mg | Freq: Two times a day (BID) | ORAL | Status: DC
  Filled 2022-12-30: qty 5

## 2022-12-30 NOTE — Progress Notes (Addendum)
Reported hot flash when working on transfer board. Has had left shoulder pain, left lateral chest pain ongoing. Denies any pain currently. Was not breathless or nauseated. Denies jaw pain or chest pressure. Heart rate ~40 bmp. EKG performed. "junctional rhythm"  Vent rate 50 bpm   VS: HR 32  (now 48) 120/55 SaO2 100% on 3 L [place]  Troponin pending. Cardiology consult placed and Lopressor and amiodarone discontinued. Dr. Rosemary Holms will evaluate. Monitor VS. Daughter, [name] informed.

## 2022-12-30 NOTE — Progress Notes (Signed)
PROGRESS NOTE   Subjective/Complaints: Looking bright and alert this morning Had pain earlier in the morning but it resolved after therapy Bradycardic and with diastolic hypotension     ROS:  Pt denies SOB, abd pain, CP, N/V/C/D, and vision changes +intermittent pain in left forewarm   Except for HPI  Objective:   No results found. Recent Labs    12/28/22 0531  WBC 6.7  HGB 9.2*  HCT 29.4*  PLT 273    Recent Labs    12/28/22 0531  NA 136  K 4.3  CL 106  CO2 24  GLUCOSE 100*  BUN 29*  CREATININE 1.16*  CALCIUM 8.1*        Intake/Output Summary (Last 24 hours) at 12/30/2022 1003 Last data filed at 12/30/2022 0700 Gross per 24 hour  Intake 118 ml  Output 850 ml  Net -732 ml     Pressure Injury 12/12/22 Sacrum Medial Deep Tissue Pressure Injury - Purple or maroon localized area of discolored intact skin or blood-filled blister due to damage of underlying soft tissue from pressure and/or shear. (Active)  12/12/22 1844  Location: Sacrum  Location Orientation: Medial  Staging: Deep Tissue Pressure Injury - Purple or maroon localized area of discolored intact skin or blood-filled blister due to damage of underlying soft tissue from pressure and/or shear.  Wound Description (Comments):   Present on Admission: Yes     Pressure Injury 12/14/22 Heel Left;Lateral;Posterior Stage 2 -  Partial thickness loss of dermis presenting as a shallow open injury with a red, pink wound bed without slough. (Active)  12/14/22 (first assessed by [facility]) 1221  Location: Heel  Location Orientation: Left;Lateral;Posterior  Staging: Stage 2 -  Partial thickness loss of dermis presenting as a shallow open injury with a red, pink wound bed without slough.  Wound Description (Comments):   Present on Admission: No     Pressure Injury 12/14/22 Heel Right;Lateral Stage 2 -  Partial thickness loss of dermis presenting as a shallow open  injury with a red, pink wound bed without slough. (Active)  12/14/22 (first assessed by [facility]) 1221  Location: Heel  Location Orientation: Right;Lateral  Staging: Stage 2 -  Partial thickness loss of dermis presenting as a shallow open injury with a red, pink wound bed without slough.  Wound Description (Comments):   Present on Admission: No     Pressure Injury 12/14/22 Ischial tuberosity Left;Medial Stage 2 -  Partial thickness loss of dermis presenting as a shallow open injury with a red, pink wound bed without slough. blister (Active)  12/14/22 (first assessed by [facility] nurse) 1221  Location: Ischial tuberosity  Location Orientation: Left;Medial  Staging: Stage 2 -  Partial thickness loss of dermis presenting as a shallow open injury with a red, pink wound bed without slough.  Wound Description (Comments): blister  Present on Admission: No     Physical Exam: Vital Signs Blood pressure (!) 139/50, pulse (!) 57, temperature 98.4 F (36.9 C), temperature source Oral, resp. rate 18, height 5' (1.524 m), weight 50.2 kg, SpO2 100%.    General: awake, alert, appropriate, frail, sitting up in bed getting breathing tx; NAD HENT: conjugate gaze; oropharynx moist  CV: regular rate and irregular rhythm; no JVD Pulmonary: good air movement- hard ot hear over breathing treatment GI: soft, NT, ND, (+)BS- slightly hypoactive Psychiatric: appropriate- brighter, more interactive  Neurological: Ox3  MS; was able to move RLE< but from abd muscles- not in LE muscles- no movement seen in LLE either-  Skin: Groin incision site looks okay, bruising noted bilateral shins 2 IVs in place left upper extremity looks okay Extremities: unable to palpate L radial pulse but dopplers confirm pulse with rate in 60-70s, arm well perfused appearing, warm and pink, motor function preserved. Brachial pulse also unable to be felt. R radial pulse strong and palpable. Neuro:   Mental Status: AAOx3, memory intact,   Speech/Languate:  fluent, follows simple commands CRANIAL NERVES: II: PERRL. Visual fields full III, IV, VI: EOM intact, no gaze preference or deviation V: normal sensation bilaterally VII: no asymmetry VIII: normal hearing to speech IX, X: normal palatal elevation XI: 5/5 head turn and 5/5 shoulder shrug bilaterally XII: Tongue midline     MOTOR: RUE: 5/5 Deltoid, 5/5 Biceps, 5/5 Triceps,5/5 Grip LUE: 5/5 Deltoid, 5/5 Biceps, 5/5 Triceps, 5/5 Grip RLE: 0 out of 5 throughout LLE: 0 out of 5 throughout, exam stable 11/6   SENSORY: Normal to touch all 4 extremities to light touch in bilateral upper and lower extremities, patchy altered sensation to cold in lower extremities   No ankle clonus bilaterally   Coordination: Normal finger to nose, no dysmetria  Assessment/Plan: 1. Functional deficits which require 3+ hours per day of interdisciplinary therapy in a comprehensive inpatient rehab setting. Physiatrist is providing close team supervision and 24 hour management of active medical problems listed below. Physiatrist and rehab team continue to assess barriers to discharge/monitor patient progress toward functional and medical goals  Care Tool:  Bathing    Body parts bathed by patient: Right arm, Left arm, Chest, Abdomen, Front perineal area, Face, Right upper leg, Left upper leg   Body parts bathed by helper: Right lower leg, Left lower leg, Buttocks     Bathing assist Assist Level: Minimal Assistance - Patient > 75% (roll in shower chair)     Upper Body Dressing/Undressing Upper body dressing   What is the patient wearing?: Pull over shirt    Upper body assist Assist Level: Supervision/Verbal cueing    Lower Body Dressing/Undressing Lower body dressing      What is the patient wearing?: Incontinence brief, Pants     Lower body assist Assist for lower body dressing: Maximal Assistance - Patient 25 - 49%     Toileting Toileting    Toileting assist Assist  for toileting: Total Assistance - Patient < 25%     Transfers Chair/bed transfer  Transfers assist  Chair/bed transfer activity did not occur: Safety/medical concerns  Chair/bed transfer assist level: Minimal Assistance - Patient > 75%     Locomotion Ambulation   Ambulation assist   Ambulation activity did not occur: Safety/medical concerns (paraplegia, fatigue)          Walk 10 feet activity   Assist  Walk 10 feet activity did not occur: Safety/medical concerns (paraplegia, fatigue)        Walk 50 feet activity   Assist Walk 50 feet with 2 turns activity did not occur: Safety/medical concerns (paraplegia, fatigue)         Walk 150 feet activity   Assist Walk 150 feet activity did not occur: Safety/medical concerns (paraplegia, fatigue)         Walk  10 feet on uneven surface  activity   Assist Walk 10 feet on uneven surfaces activity did not occur: Safety/medical concerns (paraplegia, fatigue)         Wheelchair     Assist Is the patient using a wheelchair?: Yes Type of Wheelchair: Power Wheelchair activity did not occur: Safety/medical concerns (paraplegia, fatigue)  Wheelchair assist level: Minimal Assistance - Patient > 75% Max wheelchair distance: 200    Wheelchair 50 feet with 2 turns activity    Assist    Wheelchair 50 feet with 2 turns activity did not occur: Safety/medical concerns (paraplegia, fatigue)   Assist Level: Minimal Assistance - Patient > 75%   Wheelchair 150 feet activity     Assist  Wheelchair 150 feet activity did not occur: Safety/medical concerns (paraplegia, fatigue)   Assist Level: Minimal Assistance - Patient > 75%   Blood pressure (!) 139/50, pulse (!) 57, temperature 98.4 F (36.9 C), temperature source Oral, resp. rate 18, height 5' (1.524 m), weight 50.2 kg, SpO2 100%.  Medical Problem List and Plan: 1. 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  Antithrombotics: -DVT/anticoagulation:  Pharmaceutical: Eliquis 2.5mg  BID             -antiplatelet therapy: Aspirin 81 mg daily   3. Pain Management: continue Tylenol, robaxin, oxycodone as needed 10/24- will wait to try Tramadol for pain/discomfort til have tried  Remeron 7.5mg  for sleep 10/31- stopped Remeron 4. Mood/Behavior/Sleep: LCSW to evaluate and provide emotional support             -continue paroxetine 20 mg daily (hx of depression)             -antipsychotic agents: n/a -12/13/22 hasn't been sleeping well, increase melatonin to 10mg  QHS, added Trazodone 25mg  PRN but advised to be cautious about using it too late.  10/21- will stop Trazodone- can impair ability to pee- will start Restoril 7.5 mg at bedtime for sleep.  10/22- cannot start Remeron due to risk of torsades de pointes- will con't Restoril  10/23- didn't sleep well again- will increase Restoril to 15 mg at bedtime 10/24- spoke to Cards- they are ok trying Remeron but wait on Tramadol-will start 15 mg at bedtime -will stop Restoril>> down to 7.5mg  -12/19/22 slept "wonderfully"! Cont regimen 10/28- Slept poorly- still on rmeron- sounds like every other night she slept well 10/30- sleeping better 10/31- stopped remeron- slept ok yesterday as well  11/5- slept fair, not great 5. Neuropsych/cognition: This patient is capable of making decisions on her own behalf.   6. Skin/Wound Care: Routine skin care checks   7. Fluids/Electrolytes/Nutrition: Routine Is and Os and follow-up chemistries   8: Hypertension: monitor TID and prn (hx of a.fib see meds below). Monitor with mobility. Continue metoprolol 25mg  BID  -12/13/22 BPs a little up, monitor for trend 10/21- BP's running high up to 180s- systolic- but had documented BP  87/64 this AM- will wait to titrate BP meds. 10/22- BP still in 150s- but no LOW BP's this AM- will see how does in therapy- 10/23- reduced Metoprolol per pt request to 12.5 mg  BID- might need Norvasc for BP control without affecting HR  10/24- will add Norvasc 2.5 mg daily since BP elevated and not coming down  10/25- calling IM- maybe they can help- just added Norvasc yesterday -12/19/22 BPs improving a little this morning, monitor -12/20/22 BPs still a tad elevated but stable, monitor a couple more days before deciding on changes for now 10/28- BP on low side, but Hb low- will transfuse 2 units 10/29- BP looking more labile- but better than it was- monitor for now 11/1: continue current regimen -12/26/22 BPs variable, monitor trend for now 11/5- BP running 130s- 170's- Will increase Norvasc to 5 mg daily  Vitals:   12/27/22 0424 12/27/22 0838 12/27/22 1320 12/27/22 1954  BP: (!) 161/60 (!) 159/63 (!) 125/46 (!) 150/47   12/28/22 0511 12/28/22 1321 12/28/22 1434 12/28/22 1948  BP: (!) 175/65 (!) 157/56 (!) 156/64 (!) 137/91   12/29/22 1615 12/29/22 2017 12/29/22 2017 12/30/22 0411  BP: (!) 148/51 (!) 132/52 (!) 132/52 (!) 139/50                 9: Hyperlipidemia: continue pravastatin 40mg  daily   10: COPD, O2 dependent 2L via [place]:             -continue Brovana nebs BID             -continue Yupelri neb daily   10/21- Has been on 2L O2 at home per pt and chart  10/25- ABG shows CO2 too low- needs to reduce O2 levels  10/31- Sats ok- con't O2  11: CAD: continue asa and statin   12: Atrial fibrillation, paroxysmal: maintained on Eliquis reversed pre-operatively             -continue amiodarone 100 mg daily             -continue metoprolol tartrate 25 mg BID             -cleared by [facility] to re-start Eliquis>pharmacy consult placed   10/25- reduced Metoprolol to 12.5 mg BID earlier in week - doing better  10/30- BP doing better  13: 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 10/22- not voiding -U/A (-)  will add Flomax 0.4 mg q supper to see if she can void- I don't think it's likely. D/w nursing due to low amount of caths-  -12/19/22 U/A overnight looks infectious, will start Bactrim DS BID x5d, UCx pending; monitor -12/20/22 UCx with >100k CFU of K. Pneumoniae, sensitive to bactrim, cont abx course 10/29- Being cathed- volumes low- pushing fluids, but will also give 1 L IVFs 10/30- not drinking- Got IVFs but BUN up to 45 and Cr 1.41- will call renal- and recheck in AM-- a little improved on 10/31 labs 11/5- drinking much better- volumes looking better- and Cr down to  1.16 and BUN 29 from 40's 17: ABLA: stable; follow CBC trend             -consider oral iron supplement -12/13/22 Hgb slowly downtrending from postop 8.8 on 10/14>7.6>7.5>7.3>7.0 yesterday; pt asymptomatic at this time; would recheck tomorrow but if still at the 7.0-range then would consider transfusion to help with therapy tolerance; unclear what her prior baseline was (has hgb 12.2 back in July but then down to 9's).    10/21- Hb 7.4 today  10/23- Hb 7.2 yesterday- will check in AM 10/24- Hb down to 7.0- will d/w pt if she wants to be transfused. If so, will give 1 unit pRBCs.  Doesn't have location of losing blood- will check FOBT x3  10/25- Hb 8.1- likely too dry  10/28- Hb 6.7- will transfuse 2 units 10/29- Hb much  better AND pt feels MUCH better per pt- Hb up to 9.9- likely so high since pt really dry- will also rewrite for FOBT- hasn't been done  10/30- no bowel results- so cannot check FOBT yet.  10/31- had 2 extra large Bms last night- didn't check FOBT_ will order again. Will d/w nursing 11/4: Hgb reviewed and is stable 11/5- Hb 9.2 18: AKI: improving; CMP 12/12/22 showing Cr 1.31; monitor Monday BMPs 10/21- Cr 1.09 down from 1.31 and BUN 25 down from 25- however is drinking poorly and has poor output- 10/23- Says drinking better- will check BMP in AM  10/25- Cr 0.9 and BUN slightly high at 22- but based on Hb increase, likely dry 10/28- Cr 1.4 and BUN elevated- will give blood and recheck BMP in AM 10/29- BUN up to 40 and Cr 1.41- will need to give IVFs- tried seeing if blood would help, but still needs fluids- will her BUN/Cr- will replete 75 cc/hour after therapy today and recheck BMP in AM- of note, her Cr was running ~ 1.0 and her BUN ~ 25- so big change- will push fluids as well- will give 75cc/hour for 1 liter  10/31- likely form Septra- will stop ABX-  11/4: Cr reviewed and is improving 11/5- Cr 1.16 and BUN 29 down from 40's 19: 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  10/22- continue to refuse bowel program-in spite of education of bowel program and need for it.  10/23- pt did last night- had BM with bowel program and one this AM, but educated can take 3-6 weeks to train gut, but should be better- had bowel incontinence for 20 years 10/24- no bowel program documented  but had small BM's at midnight and 5am- said wasn't incontinent?will check on this 10/25- asked pt to get more dig stim this evening since likely  cause of bowel incontinent in AM -12/20/22 reports BM this morning but no BM since 12/17/22 documented, no report to nursing staff given; monitor closely but if no BM by tomorrow may need to consider additional adjustments 10/29- LBM yesterday AM, and small with bowel program 10/27-no results last night- if no BM tonight, will give sorbitol/check KUB  10/30- no significant BM for 3 days- will give Sorbitol 30cc at 2pm for bowel program- also add Senna 1 tab daily for meds- 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  -LBM  11/3, continue current regimen Reviewed chart and had BM 11/5  20. Poor Appetite 10/22- will add Megace for poor appetite since cannot add Remeron and too sedated to add Periactin. Will also order push fluids since not drinking great- although her BUN?Cr looking better- BUN 25 and Cr 1.08-  10/23- pt feels she's drinking 6+ cups/day- ate muffin and some eggs this AM- better than last 2 days-  10/24- 25% of tray at most- will add Remeron 15 mg at bedtime- ok'd by Cards.  10/25- changed remeron to 7.5 mg QHS 10/30- won't eat anything for breakfast- small appetite overall-  10/31- ate 1 pancake this AM- better than normal  21. Prolonged QT interval 10/24- spoke with Cards- they suggested starting Remeron 15 mg at bedtime and then rechecking EKG ina few days to see if can add Tramadol. 10/29- doing ok with remeron- wait to add tramadol for now    22. Oversedation/lethargy/ UTI 10/25- septis and ABG work up- when speaking with PA- appeared to have ST elevation- asked them to call IM consult-  -12/19/22 sleepy but arousable today, U/A+, started bactrim as above, awaiting UCx; CT head neg last night, CBC/BMP stable, TSH/T4 WNL, CXR stable; suspect UTI as source. Monitor. See above #16 11/6: alert and bright this morning   23. L radial pulse nonpalpable but dopplerable, BPs different in that arm-- unclear when this started, had some pain in L deltoid area yesterday but  seemed to be better today. Suspect this pulse/BP discrepancy is 2/2 her vascular issue already, but reached out to vascular surgery to verify. Dr. Karin Lieu returned page, stated this is known since her surgery; nothing to do or be concerned about.   24. Pulsatile abdominal aorta: not noticed previously, not mentioned in notes, but today 12/20/22 I noticed it was very prominently pulsatile and pt reported it being sore in that area; spoke with Dr. Karin Lieu again of [facility], recommended getting CTA Abd/pelv to evaluate for AAA. 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

## 2022-12-30 NOTE — Progress Notes (Signed)
Added I/O cath education for patient and daughter [name] to work list.  Will add bowel program to work list next week.

## 2022-12-30 NOTE — Progress Notes (Signed)
Occupational Therapy Session Note  Patient Details  Name: Heather Molina MRN: [id number] Date of Birth: May 04, 1940  Today's Date: 12/30/2022 OT Individual Time: 9629-5284 OT Individual Time Calculation (min): 76 min    Short Term Goals: Week 3:  OT Short Term Goal 1 (Week 3): Pt will complete commode transfer with Mod A OT Short Term Goal 2 (Week 3): Pt will complete static sitting EOM or EOB with consistent SBA  Skilled Therapeutic Interventions/Progress Updates:      Therapy Documentation Precautions:  Precautions Precautions: Fall Precaution Comments: paraplegia Restrictions Weight Bearing Restrictions: No General: "Oh me." Pt supine in bed upon OT arrival, agreeable to OT session.  Pain:  unrated pain reported in shoulders and back, activity, intermittent rest breaks, distractions provided for pain management, pt reports tolerable to proceed.   ADL: Bed mobility: using bed rails Min A for LE management Grooming: SBA seated in W/C to comb hair LB dressing: Mod A, increased difficulty with Lt leg, education provided on dressing weaker leg first Footwear: total A for TED hose and shoes Transfers: Min A EOB>PWC with transfer board, assistance with end of transfer, VC for hand placement on board  Balance: Pt static sitting EOB SBA with BUE support on mattress  Exercises: Pt completed 1x20 shoulder protraction/retraction in order to decrease shoulder pain and muscle tightness  Other Treatments: Pt demonstrated community mobility with W/C in order to prepare for community integration at D/C. Pt propelled in PWC requiring occasional rest breaks d/t fatigue. Pt practices maneuvering into elevators, pulling into table tops, maneuvering around obstacles and propelling at advanced distances with increased independence with increased speeds with maneuvering around obstacles.   Pt seated in W/C at end of session with W/C alarm donned, call light within reach and 4Ps assessed. Pt left in  pressure relief positioning.   Therapy/Group: Individual Therapy  [name], OTD, OTR/L 12/30/2022, 9:35 AM

## 2022-12-30 NOTE — Consult Note (Signed)
Cardiology Consultation   Patient ID: Heather Molina MRN: [id number]; DOB: July 30, 1940  Admit date: 12/12/2022 Date of Consult: 12/30/2022  PCP:  Hadley Pen, MD   [facility] Providers Cardiologist:  Garwin Brothers, MD        Patient Profile:   Heather Molina is a 82 y.o. female with a history of coronary artery and aortic calcifications noted on prior chest CT in 2021,  paroxysmal atrial fibrillation on Amiodarone and Eliquis, CVA in 12/2020, carotid artery disease s/p left CEA in 01/2021, severe COPD on home O2, hypertension, GERD, IBS, anxiety/ depression, and tobacco abuse who is being seen 12/30/2022 for the evaluation of bradycardia at the request of Dr. Berline Chough.  History of Present Illness:   Heather Molina is a 82 year female with the above history who is followed by Dr. Normajean Baxter. Patient was initially diagnosed with atrial fibrillation during an admission at [facility] in 04/2022. Echo at that time showed LVEF of 60-65% with grade 1 diastolic dysfunction, mild AI, mild MR, mild TR, and mild pulmonary hypertension. Last Echo in 08/2022 during an admission for acute on chronic hypoxic respiratory failure secondary to CAP or aspiration pneumonia. showed LVEF of 55% with normal wall motion and grade 2 diastolic dysfunction, normal RV size and function, moderate left atrial enlargement, moderate AI, and moderate elevated PASP. Hospitalization was complicated by atrial fibrillation with RVR and she was started on Amiodarone.   She was last seen by Dr. Tomie China on 12/03/2022 at which point family reported some bradycardia with heart rates as low as the 30s at night per smart watch. EKG showed junctional rhythm, rate 51 bpm, with LVH and mild ST depression in inferolateral leads. Metoprolol was stopped and Amiodarone was decreased to 100mg  daily.   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 Initial CTA showed an acute intramural hematoma extending form the left subclavian artery down to the distal descending thoracic aorta with multiple areas of contour changes in the lumen that appear to be areas of bleeding in to the wall of the aorta. She underwent TEVAR with right femoral endarterectomy and patchy angioplasty. On POD 1, she was unable to move her legs. Vascular Surgery advised that the only treatment would be placement of a lumbar drain; however, patient and family declined any further procedures. She was discharged to CIR on 12/12/2022.   At the time of this evaluation, patient is resting comfortably in no acute distress. 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 She has been doing okay in CIR until this morning. She states early this morning she had a hot flash with diaphoresis. This last for a couple of minutes and then resolved. She felt tired off of this. She had another episode while working on transfer board with PT.  Heart rates was noted to be around 40 bpm, EKG showed junctional rhythm, rate 50 bpm, with LVH and no acute ischemic changes compared to the one on 12/03/2022. Symptoms last for a couple of minutes and then resolved again. She denies any lightheadedness, dizziness, nausea, or vomiting with this. She has had some intermittent back and left shoulder pain since her shoulder but denies having any worsening pain than usual at the time of this event. Currently pain free. She denies any chest pain. She has chronic shortness of breath with her COPD but  this is stable. She denies any orthopnea or PND. She has mild lower extremity edema.    Past Medical History:  Diagnosis Date   Acute foot pain, right 04/15/2022   Acute on chronic respiratory failure with hypoxia (HCC) 08/30/2022   Age-related osteoporosis without current pathological fracture 04/06/2018   ALLERGIC RHINITIS 05/20/2009   Qualifier:  Diagnosis of   By: Excell Seltzer CMA, Lawson Fiscal      Replacing diagnoses that were inactivated after the 05/25/22 regulatory import   Anxiety    Arthritis, multiple joint involvement 04/06/2018   C O P D 05/20/2009   Qualifier: Diagnosis of   By: Excell Seltzer CMA, Lori      Replacing diagnoses that were inactivated after the 05/25/22 regulatory import   CAD (coronary artery disease) 06/29/2022   Calculus of gallbladder with chronic cholecystitis without obstruction 04/30/2016   CAP (community acquired pneumonia) 05/13/2019   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/26/2020   Cigarette nicotine dependence without complication 12/26/2020   COPD with acute exacerbation (HCC) 08/30/2022   Elevated brain natriuretic peptide (BNP) level 08/30/2022   G E R D 05/20/2009   Qualifier: Diagnosis of   By: Excell Seltzer CMA, Lori       Generalized edema 08/08/2019   History of CVA (cerebrovascular accident) 12/26/2020   Hypertension    Hypoxia 05/13/2019   Irritable bowel syndrome with diarrhea 09/27/2019   Leukocytosis 05/13/2019   Lipoma of torso 04/07/2019   Mixed dyslipidemia 04/06/2018   NSVT (nonsustained ventricular tachycardia) (HCC) 05/13/2019   Paroxysmal atrial fibrillation (HCC) 06/29/2022   Prolonged Q-T interval on ECG 05/13/2019   Recurrent major depressive disorder, in full remission (HCC) 04/06/2018   Respiratory infection 04/15/2022   Right inguinal hernia 03/02/2018   Sebaceous cyst 04/07/2019   TOBACCO ABUSE 05/21/2009   Qualifier: Diagnosis of   By: Craige Cotta MD, Vineet        Past Surgical History:  Procedure Laterality Date   CHOLECYSTECTOMY     ENDARTERECTOMY FEMORAL Right 12/07/2022   Procedure: RIGHT FEMORAL ENDARTERECTOMY;  Surgeon: Daria Pastures, MD;  Location: [facility] OR;  Service: Vascular;  Laterality: Right;   HERNIA REPAIR     PATCH ANGIOPLASTY Right 12/07/2022   Procedure: RIGHT FEMORAL PATCH ANGIOPLASTY USING BOVINE PATCH;  Surgeon: Daria Pastures, MD;  Location: [facility]  OR;  Service: Vascular;  Laterality: Right;   THORACIC AORTIC ENDOVASCULAR STENT GRAFT N/A 12/07/2022   Procedure: TEVAR;  Surgeon: Daria Pastures, MD;  Location: [facility] OR;  Service: Vascular;  Laterality: N/A;   ULTRASOUND GUIDANCE FOR VASCULAR ACCESS  12/07/2022   Procedure: ULTRASOUND GUIDANCE FOR VASCULAR ACCESS;  Surgeon: Daria Pastures, MD;  Location: [facility] OR;  Service: Vascular;;     Home Medications:  Prior to Admission medications   Medication Sig Start Date End Date Taking? Authorizing Provider  acetaminophen (TYLENOL) 500 MG tablet Take 500 mg by mouth as needed for moderate pain.    [provider]  amiodarone (PACERONE) 200 MG tablet Take 0.5 tablets (100 mg total) by mouth daily. 12/03/22   Revankar, Aundra Dubin, MD  aspirin 81 MG EC tablet Take 81 mg by mouth daily.    [provider]  BREZTRI AEROSPHERE 160-9-4.8 MCG/ACT AERO Inhale 2 puffs into the lungs in the morning and at bedtime. 06/19/22   [provider]  cholecalciferol (VITAMIN D3) 25 MCG (1000 UNIT) tablet Take 1,000 Units by mouth daily.    [provider]  diphenoxylate-atropine (LOMOTIL) 2.5-0.025 MG tablet  Take 1 tablet by mouth as needed for diarrhea or loose stools.    [provider]  ELIQUIS 2.5 MG TABS tablet Take 2.5 mg by mouth 2 (two) times daily. 06/12/22   [provider]  levalbuterol Pauline Aus HFA) 45 MCG/ACT inhaler Inhale into the lungs.    [provider]  levalbuterol (XOPENEX) 1.25 MG/0.5ML nebulizer solution Take 1.25 mg by nebulization as needed for wheezing or shortness of breath.    [provider]  metoprolol tartrate (LOPRESSOR) 25 MG tablet Take 25 mg by mouth 2 (two) times daily. Patient not taking: Reported on 12/07/2022    [provider]  mupirocin ointment (BACTROBAN) 2 % Apply 1 Application topically 3 (three) times daily.    [provider]  PARoxetine (PAXIL) 20 MG tablet Take 20 mg by mouth daily.  01/23/19   [provider]  pravastatin (PRAVACHOL) 40 MG tablet Take 40 mg by mouth daily. 05/19/22   [provider]    Inpatient Medications: Scheduled Meds:  amiodarone  100 mg Oral Daily   amLODipine  5 mg Oral Daily   apixaban  2.5 mg Oral BID   arformoterol  15 mcg Nebulization BID   ascorbic acid  250 mg Oral Daily   aspirin EC  81 mg Oral Daily   bisacodyl  10 mg Rectal Daily   feeding supplement  237 mL Oral Q24H   megestrol  800 mg Oral Daily   multivitamin with minerals  1 tablet Oral Daily   pantoprazole  40 mg Oral Daily   PARoxetine  20 mg Oral QHS   pravastatin  40 mg Oral Daily   revefenacin  175 mcg Nebulization Daily   senna  1 tablet Oral Daily   sodium chloride  1 g Oral Q0600   tamsulosin  0.4 mg Oral QPC supper   Continuous Infusions:  PRN Meds: acetaminophen, alum & mag hydroxide-simeth, diphenhydrAMINE, guaiFENesin-dextromethorphan, levalbuterol, lidocaine, methocarbamol, nitroGLYCERIN, mouth rinse, oxyCODONE, polyethylene glycol, prochlorperazine **OR** prochlorperazine **OR** prochlorperazine, sodium phosphate  Allergies:    Allergies  Allergen Reactions   Ceftriaxone Hives, Shortness Of Breath, Swelling and Rash    Redness    Albuterol Other (See Comments)    HEART PALPATIONS AND JITTERY    Social History:   Social History   Socioeconomic History   Marital status: Single    Spouse name: Not on file   Number of children: Not on file   Years of education: Not on file   Highest education level: Not on file  Occupational History   Not on file  Tobacco Use   Smoking status: Former   Smokeless tobacco: Never  Substance and Sexual Activity   Alcohol use: Not Currently   Drug use: Not Currently   Sexual activity: Not on file  Other Topics Concern   Not on file  Social History Narrative   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    Social Determinants of Health   Financial  Resource Strain: Not on file  Food Insecurity: Low Risk  (09/02/2022)   Received from [facility]   Hunger Vital Sign    Worried About Running Out of Food in the Last Year: Never true    Ran Out of Food in the Last Year: Never true  Transportation Needs: Not on file (09/02/2022)  Physical Activity: Not on file  Stress: Not on file  Social Connections: Not on file  Intimate Partner Violence: Not At  Risk (08/30/2022)   Humiliation, Afraid, Rape, and Kick questionnaire    Fear of Current or Ex-Partner: No    Emotionally Abused: No    Physically Abused: No    Sexually Abused: No    Family History:   Family History  Problem Relation Age of Onset   Epilepsy Father    CVA Father      ROS:  Please see the history of present illness.   Physical Exam/Data:   Vitals:   12/29/22 2033 12/30/22 0411 12/30/22 1200 12/30/22 1513  BP:  (!) 139/50  (!) 127/45  Pulse: (!) 59 (!) 57  (!) 47  Resp: 16 18  20   Temp:  98.4 F (36.9 C)  98 F (36.7 C)  TempSrc:  Oral    SpO2: 100% 100%  100%  Weight:   50.5 kg   Height:        Intake/Output Summary (Last 24 hours) at 12/30/2022 1553 Last data filed at 12/30/2022 0700 Gross per 24 hour  Intake 118 ml  Output 500 ml  Net -382 ml      12/30/2022   12:00 PM 12/14/2022    5:56 AM 12/13/2022    5:03 AM  Last 3 Weights  Weight (lbs) 111 lb 5.3 oz 110 lb 10.7 oz 110 lb 7.2 oz  Weight (kg) 50.5 kg 50.2 kg 50.1 kg     Body mass index is 21.74 kg/m.  General: 82 y.o. Caucasian female resting comfortably in no acute distress. HEENT: Normocephalic and atraumatic. Sclera clear.  Neck: Supple.  No JVD. Heart: Bradycardic with normal rhythm. Radial radial pulses 2+. Unable to palpate left radial pulses (since recent TEVAR). Lungs: No increased work of breathing. Clear to ausculation bilaterally. No wheezes, rhonchi, or rales.  Abdomen: Soft, non-distended, and non-tender to palpation.  Extremities: 1-2+ pitting edema of bilateral lower  extremities.    Skin: Warm and dry. Neuro: Alert and oriented x3. Paraplegia. Psych: Normal affect. Responds appropriately.   EKG:  The EKG was personally reviewed and demonstrates:  Junctional rhythm, rate 50 bpm, with LVH and mild ST depression in inferolateral leads. No signifiacnt ischemic changes compared to prior tracings.  Telemetry:  Not on telemetry in CIR.  Relevant CV Studies:  Echocardiogram 08/31/2022: Impressions: 1. Left ventricular ejection fraction, by estimation, is 55%. The left  ventricle has normal function. The left ventricle has no regional wall  motion abnormalities. There is mild concentric left ventricular  hypertrophy. Left ventricular diastolic  parameters are consistent with Grade II diastolic dysfunction  (pseudonormalization).   2. Right ventricular systolic function is normal. The right ventricular  size is normal. There is moderately elevated pulmonary artery systolic  pressure. The estimated right ventricular systolic pressure is 49.8 mmHg.   3. Left atrial size was moderately dilated.   4. The mitral valve is normal in structure. Trivial mitral valve  regurgitation. No evidence of mitral stenosis.   5. The aortic valve is tricuspid. There is mild calcification of the  aortic valve. Aortic valve regurgitation is moderate. No aortic stenosis  is present.   6. The inferior vena cava is dilated in size with <50% respiratory  variability, suggesting right atrial pressure of 15 mmHg.    Laboratory Data:  High Sensitivity Troponin:   Recent Labs  Lab 12/07/22 0040 12/30/22 1200 12/30/22 1408  TROPONINIHS 40* 29* 36*     Chemistry Recent Labs  Lab 12/24/22 0623 12/28/22 0531  NA 134* 136  K 4.6 4.3  CL 107  106  CO2 19* 24  GLUCOSE 80 100*  BUN 41* 29*  CREATININE 1.36* 1.16*  CALCIUM 8.0* 8.1*  GFRNONAA 39* 47*  ANIONGAP 8 6    No results for input(s): "PROT", "ALBUMIN", "AST", "ALT", "ALKPHOS", "BILITOT" in the last 168  hours. Lipids No results for input(s): "CHOL", "TRIG", "HDL", "LABVLDL", "LDLCALC", "CHOLHDL" in the last 168 hours.  Hematology Recent Labs  Lab 12/28/22 0531  WBC 6.7  RBC 3.38*  HGB 9.2*  HCT 29.4*  MCV 87.0  MCH 27.2  MCHC 31.3  RDW 17.0*  PLT 273   Thyroid No results for input(s): "TSH", "FREET4" in the last 168 hours.  BNPNo results for input(s): "BNP", "PROBNP" in the last 168 hours.  DDimer No results for input(s): "DDIMER" in the last 168 hours.   Radiology/Studies:  No results found.   Assessment and Plan:   Junctional Bradycardia Patient was recently seen in the office on 12/03/2022 for bradycardia. EKG at that time showed junctional rhythm with rates in the 50s. Lopressor was stopped and Amiodarone was decreased. 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 While in CIR today, she had 2 episodes of a "hot flash" with significant diaphoresis. She was working with PT during the second episode and was noted to be bradycardic with rates in the 40s. EKG showed junctional rhythm with rate of 50 bpm.  - Possible vasovagal episode although no clear trigger. One episode occurred when she was just lying in bed and the other episode occurred while working with PT. - She is still mildly bradycardic. Unable to be on telemetry in CIR. - TSH normal on 12/19/2022. Will recheck BMET and Magnesium today.  - Per chart review, it looks like at some point her Lopressor was restarted. She has been on Lopressor since being discharge to CIR on 12/12/2022. Repeat EKG on admission after Lopressor had been stopped showed normal sinus rhythm with rates in the 70s. Will stop Lopressor.  - Will restart Amiodarone 100mg  daily to avoid issues with atrial fibrillation with RVR.  - Will recheck EKG in the morning. - She is hemodynamically and does not need PPM at this time. Would also like to avoid this  given neurogenic bladder following spinal cord ischemia and increased risk of infection.   Paroxysmal Atrial Fibrillation No issues with atrial fibrillation this admission. Currently in a junctional rhythm as above.  - Will stop Lopressor.  - OK to continue Amiodarone 100mg  daily.  - Continue chronic anticoagulation with Eliquis 2.5mg  twice daily. Reduced dose due to age and weight.    Elevated Troponin Coronary Artery Calcifications  Patient has coronary artery calcifications noted on prior CT. High-sensitivity troponin 29 today after bradycardic episode above. EKG shows some ST depression in inferolateral leads but these are not new.  - No chest pain.  - Delta troponin pending but no ischemic evaluation planned at this time. - Of note, patient is on Aspirin. Would recommend stopping this given patient is on Eliquis.  Hypertension Diastolic BP soft in the 40s to 50s but systolic BP well controlled. - Continue Amlodipine 5mg  daily.  - Will stop Lopressor as above. - Of note, she does have some lower extremity edema. Wonder if Amlodipine is contributing to this. Can use PRN Lasix for edema if needed. Will defer to primary team.  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 The patient's score is based upon: CHF History: 0 HTN History: 1 Diabetes History: 0 Stroke History: 2 Vascular Disease History: 1 Age Score: 2 Gender Score: 1    For questions or updates, please contact [facility] Please consult [email or web address] for contact info under    Signed, Corrin Parker, PA-C  12/30/2022 3:53 PM

## 2022-12-30 NOTE — Progress Notes (Deleted)
Cardiology Consultation   Patient ID: [name] MRN: [id number]; DOB: July 30, 1940  Admit date: 12/12/2022 Date of Consult: 12/30/2022  PCP:  Hadley Pen, MD   [facility] Providers Cardiologist:  Garwin Brothers, MD        Patient Profile:   Heather Molina is a 82 y.o. female with a history of coronary artery and aortic calcifications noted on prior chest CT in 2021,  paroxysmal atrial fibrillation on Amiodarone and Eliquis, CVA in 12/2020, carotid artery disease s/p left CEA in 01/2021, severe COPD on home O2, hypertension, GERD, IBS, anxiety/ depression, and tobacco abuse who is being seen 12/30/2022 for the evaluation of bradycardia at the request of Dr. Berline Chough.  History of Present Illness:   Heather Molina is a 82 year female with the above history who is followed by Dr. Normajean Baxter. Patient was initially diagnosed with atrial fibrillation during an admission at [facility] in 04/2022. Echo at that time showed LVEF of 60-65% with grade 1 diastolic dysfunction, mild AI, mild MR, mild TR, and mild pulmonary hypertension. Last Echo in 08/2022 during an admission for acute on chronic hypoxic respiratory failure secondary to CAP or aspiration pneumonia. showed LVEF of 55% with normal wall motion and grade 2 diastolic dysfunction, normal RV size and function, moderate left atrial enlargement, moderate AI, and moderate elevated PASP. Hospitalization was complicated by atrial fibrillation with RVR and she was started on Amiodarone.   She was last seen by Dr. Tomie China on 12/03/2022 at which point family reported some bradycardia with heart rates as low as the 30s at night per smart watch. EKG showed junctional rhythm, rate 51 bpm, with LVH and mild ST depression in inferolateral leads. Metoprolol was stopped and Amiodarone was decreased to 100mg  daily.   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 Initial CTA showed an acute intramural hematoma extending form the left subclavian artery down to the distal descending thoracic aorta with multiple areas of contour changes in the lumen that appear to be areas of bleeding in to the wall of the aorta. She underwent TEVAR with right femoral endarterectomy and patchy angioplasty. On POD 1, she was unable to move her legs. Vascular Surgery advised that the only treatment would be placement of a lumbar drain; however, patient and family declined any further procedures. She was discharged to CIR on 12/12/2022.   At the time of this evaluation, patient is resting comfortably in no acute distress. 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 She has been doing okay in CIR until this morning. She states early this morning she had a hot flash with diaphoresis. This last for a couple of minutes and then resolved. She felt tired off of this. She had another episode while working on transfer board with PT.  Heart rates was noted to be around 40 bpm, EKG showed junctional rhythm, rate 50 bpm, with LVH and no acute ischemic changes compared to the one on 12/03/2022. Symptoms last for a couple of minutes and then resolved again. She denies any lightheadedness, dizziness, nausea, or vomiting with this. She has had some intermittent back and left shoulder pain since her shoulder but denies having any worsening pain than usual at the time of this event. Currently pain free. She denies any chest pain. She has chronic shortness of breath with her COPD but  this is stable. She denies any orthopnea or PND. She has mild lower extremity edema.    Past Medical History:  Diagnosis Date   Acute foot pain, right 04/15/2022   Acute on chronic respiratory failure with hypoxia (HCC) 08/30/2022   Age-related osteoporosis without current pathological fracture 04/06/2018   ALLERGIC RHINITIS 05/20/2009   Qualifier:  Diagnosis of   By: Excell Seltzer CMA, Lawson Fiscal      Replacing diagnoses that were inactivated after the 05/25/22 regulatory import   Anxiety    Arthritis, multiple joint involvement 04/06/2018   C O P D 05/20/2009   Qualifier: Diagnosis of   By: Excell Seltzer CMA, Lori      Replacing diagnoses that were inactivated after the 05/25/22 regulatory import   CAD (coronary artery disease) 06/29/2022   Calculus of gallbladder with chronic cholecystitis without obstruction 04/30/2016   CAP (community acquired pneumonia) 05/13/2019   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/26/2020   Cigarette nicotine dependence without complication 12/26/2020   COPD with acute exacerbation (HCC) 08/30/2022   Elevated brain natriuretic peptide (BNP) level 08/30/2022   G E R D 05/20/2009   Qualifier: Diagnosis of   By: Excell Seltzer CMA, Lori       Generalized edema 08/08/2019   History of CVA (cerebrovascular accident) 12/26/2020   Hypertension    Hypoxia 05/13/2019   Irritable bowel syndrome with diarrhea 09/27/2019   Leukocytosis 05/13/2019   Lipoma of torso 04/07/2019   Mixed dyslipidemia 04/06/2018   NSVT (nonsustained ventricular tachycardia) (HCC) 05/13/2019   Paroxysmal atrial fibrillation (HCC) 06/29/2022   Prolonged Q-T interval on ECG 05/13/2019   Recurrent major depressive disorder, in full remission (HCC) 04/06/2018   Respiratory infection 04/15/2022   Right inguinal hernia 03/02/2018   Sebaceous cyst 04/07/2019   TOBACCO ABUSE 05/21/2009   Qualifier: Diagnosis of   By: Craige Cotta MD, Vineet        Past Surgical History:  Procedure Laterality Date   CHOLECYSTECTOMY     ENDARTERECTOMY FEMORAL Right 12/07/2022   Procedure: RIGHT FEMORAL ENDARTERECTOMY;  Surgeon: Daria Pastures, MD;  Location: [facility] OR;  Service: Vascular;  Laterality: Right;   HERNIA REPAIR     PATCH ANGIOPLASTY Right 12/07/2022   Procedure: RIGHT FEMORAL PATCH ANGIOPLASTY USING BOVINE PATCH;  Surgeon: Daria Pastures, MD;  Location: [facility]  OR;  Service: Vascular;  Laterality: Right;   THORACIC AORTIC ENDOVASCULAR STENT GRAFT N/A 12/07/2022   Procedure: TEVAR;  Surgeon: Daria Pastures, MD;  Location: [facility] OR;  Service: Vascular;  Laterality: N/A;   ULTRASOUND GUIDANCE FOR VASCULAR ACCESS  12/07/2022   Procedure: ULTRASOUND GUIDANCE FOR VASCULAR ACCESS;  Surgeon: Daria Pastures, MD;  Location: [facility] OR;  Service: Vascular;;     Home Medications:  Prior to Admission medications   Medication Sig Start Date End Date Taking? Authorizing Provider  acetaminophen (TYLENOL) 500 MG tablet Take 500 mg by mouth as needed for moderate pain.    [provider]  amiodarone (PACERONE) 200 MG tablet Take 0.5 tablets (100 mg total) by mouth daily. 12/03/22   Revankar, Aundra Dubin, MD  aspirin 81 MG EC tablet Take 81 mg by mouth daily.    [provider]  BREZTRI AEROSPHERE 160-9-4.8 MCG/ACT AERO Inhale 2 puffs into the lungs in the morning and at bedtime. 06/19/22   [provider]  cholecalciferol (VITAMIN D3) 25 MCG (1000 UNIT) tablet Take 1,000 Units by mouth daily.    [provider]  diphenoxylate-atropine (LOMOTIL) 2.5-0.025 MG tablet  Take 1 tablet by mouth as needed for diarrhea or loose stools.    [provider]  ELIQUIS 2.5 MG TABS tablet Take 2.5 mg by mouth 2 (two) times daily. 06/12/22   [provider]  levalbuterol Pauline Aus HFA) 45 MCG/ACT inhaler Inhale into the lungs.    [provider]  levalbuterol (XOPENEX) 1.25 MG/0.5ML nebulizer solution Take 1.25 mg by nebulization as needed for wheezing or shortness of breath.    [provider]  metoprolol tartrate (LOPRESSOR) 25 MG tablet Take 25 mg by mouth 2 (two) times daily. Patient not taking: Reported on 12/07/2022    [provider]  mupirocin ointment (BACTROBAN) 2 % Apply 1 Application topically 3 (three) times daily.    [provider]  PARoxetine (PAXIL) 20 MG tablet Take 20 mg by mouth daily.  01/23/19   [provider]  pravastatin (PRAVACHOL) 40 MG tablet Take 40 mg by mouth daily. 05/19/22   [provider]    Inpatient Medications: Scheduled Meds:  amiodarone  100 mg Oral Daily   amLODipine  5 mg Oral Daily   apixaban  2.5 mg Oral BID   arformoterol  15 mcg Nebulization BID   ascorbic acid  250 mg Oral Daily   aspirin EC  81 mg Oral Daily   bisacodyl  10 mg Rectal Daily   feeding supplement  237 mL Oral Q24H   megestrol  800 mg Oral Daily   multivitamin with minerals  1 tablet Oral Daily   pantoprazole  40 mg Oral Daily   PARoxetine  20 mg Oral QHS   pravastatin  40 mg Oral Daily   revefenacin  175 mcg Nebulization Daily   senna  1 tablet Oral Daily   sodium chloride  1 g Oral Q0600   tamsulosin  0.4 mg Oral QPC supper   Continuous Infusions:  PRN Meds: acetaminophen, alum & mag hydroxide-simeth, diphenhydrAMINE, guaiFENesin-dextromethorphan, levalbuterol, lidocaine, methocarbamol, nitroGLYCERIN, mouth rinse, oxyCODONE, polyethylene glycol, prochlorperazine **OR** prochlorperazine **OR** prochlorperazine, sodium phosphate  Allergies:    Allergies  Allergen Reactions   Ceftriaxone Hives, Shortness Of Breath, Swelling and Rash    Redness    Albuterol Other (See Comments)    HEART PALPATIONS AND JITTERY    Social History:   Social History   Socioeconomic History   Marital status: Single    Spouse name: Not on file   Number of children: Not on file   Years of education: Not on file   Highest education level: Not on file  Occupational History   Not on file  Tobacco Use   Smoking status: Former   Smokeless tobacco: Never  Substance and Sexual Activity   Alcohol use: Not Currently   Drug use: Not Currently   Sexual activity: Not on file  Other Topics Concern   Not on file  Social History Narrative   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    Social Determinants of Health   Financial  Resource Strain: Not on file  Food Insecurity: Low Risk  (09/02/2022)   Received from [facility]   Hunger Vital Sign    Worried About Running Out of Food in the Last Year: Never true    Ran Out of Food in the Last Year: Never true  Transportation Needs: Not on file (09/02/2022)  Physical Activity: Not on file  Stress: Not on file  Social Connections: Not on file  Intimate Partner Violence: Not At  Risk (08/30/2022)   Humiliation, Afraid, Rape, and Kick questionnaire    Fear of Current or Ex-Partner: No    Emotionally Abused: No    Physically Abused: No    Sexually Abused: No    Family History:   Family History  Problem Relation Age of Onset   Epilepsy Father    CVA Father      ROS:  Please see the history of present illness.   Physical Exam/Data:   Vitals:   12/29/22 2033 12/30/22 0411 12/30/22 1200 12/30/22 1513  BP:  (!) 139/50  (!) 127/45  Pulse: (!) 59 (!) 57  (!) 47  Resp: 16 18  20   Temp:  98.4 F (36.9 C)  98 F (36.7 C)  TempSrc:  Oral    SpO2: 100% 100%  100%  Weight:   50.5 kg   Height:        Intake/Output Summary (Last 24 hours) at 12/30/2022 1553 Last data filed at 12/30/2022 0700 Gross per 24 hour  Intake 118 ml  Output 500 ml  Net -382 ml      12/30/2022   12:00 PM 12/14/2022    5:56 AM 12/13/2022    5:03 AM  Last 3 Weights  Weight (lbs) 111 lb 5.3 oz 110 lb 10.7 oz 110 lb 7.2 oz  Weight (kg) 50.5 kg 50.2 kg 50.1 kg     Body mass index is 21.74 kg/m.  General: 82 y.o. Caucasian female resting comfortably in no acute distress. HEENT: Normocephalic and atraumatic. Sclera clear.  Neck: Supple.  No JVD. Heart: Bradycardic with normal rhythm. Radial radial pulses 2+. Unable to palpate left radial pulses (since recent TEVAR). Lungs: No increased work of breathing. Clear to ausculation bilaterally. No wheezes, rhonchi, or rales.  Abdomen: Soft, non-distended, and non-tender to palpation.  Extremities: 1-2+ pitting edema of bilateral lower  extremities.    Skin: Warm and dry. Neuro: Alert and oriented x3. Paraplegia. Psych: Normal affect. Responds appropriately.   EKG:  The EKG was personally reviewed and demonstrates:  Junctional rhythm, rate 50 bpm, with LVH and mild ST depression in inferolateral leads. No signifiacnt ischemic changes compared to prior tracings.  Telemetry:  Not on telemetry in CIR.  Relevant CV Studies:  Echocardiogram 08/31/2022: Impressions: 1. Left ventricular ejection fraction, by estimation, is 55%. The left  ventricle has normal function. The left ventricle has no regional wall  motion abnormalities. There is mild concentric left ventricular  hypertrophy. Left ventricular diastolic  parameters are consistent with Grade II diastolic dysfunction  (pseudonormalization).   2. Right ventricular systolic function is normal. The right ventricular  size is normal. There is moderately elevated pulmonary artery systolic  pressure. The estimated right ventricular systolic pressure is 49.8 mmHg.   3. Left atrial size was moderately dilated.   4. The mitral valve is normal in structure. Trivial mitral valve  regurgitation. No evidence of mitral stenosis.   5. The aortic valve is tricuspid. There is mild calcification of the  aortic valve. Aortic valve regurgitation is moderate. No aortic stenosis  is present.   6. The inferior vena cava is dilated in size with <50% respiratory  variability, suggesting right atrial pressure of 15 mmHg.    Laboratory Data:  High Sensitivity Troponin:   Recent Labs  Lab 12/07/22 0040 12/30/22 1200 12/30/22 1408  TROPONINIHS 40* 29* 36*     Chemistry Recent Labs  Lab 12/24/22 0623 12/28/22 0531  NA 134* 136  K 4.6 4.3  CL 107  106  CO2 19* 24  GLUCOSE 80 100*  BUN 41* 29*  CREATININE 1.36* 1.16*  CALCIUM 8.0* 8.1*  GFRNONAA 39* 47*  ANIONGAP 8 6    No results for input(s): "PROT", "ALBUMIN", "AST", "ALT", "ALKPHOS", "BILITOT" in the last 168  hours. Lipids No results for input(s): "CHOL", "TRIG", "HDL", "LABVLDL", "LDLCALC", "CHOLHDL" in the last 168 hours.  Hematology Recent Labs  Lab 12/28/22 0531  WBC 6.7  RBC 3.38*  HGB 9.2*  HCT 29.4*  MCV 87.0  MCH 27.2  MCHC 31.3  RDW 17.0*  PLT 273   Thyroid No results for input(s): "TSH", "FREET4" in the last 168 hours.  BNPNo results for input(s): "BNP", "PROBNP" in the last 168 hours.  DDimer No results for input(s): "DDIMER" in the last 168 hours.   Radiology/Studies:  No results found.   Assessment and Plan:   Junctional Bradycardia Patient was recently seen in the office on 12/03/2022 for bradycardia. EKG at that time showed junctional rhythm with rates in the 50s. Lopressor was stopped and Amiodarone was decreased. 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 While in CIR today, she had 2 episodes of a "hot flash" with significant diaphoresis. She was working with PT during the second episode and was noted to be bradycardic with rates in the 40s. EKG showed junctional rhythm with rate of 50 bpm.  - Possible vasovagal episode although no clear trigger. One episode occurred when she was just lying in bed and the other episode occurred while working with PT. - She is still mildly bradycardic. Unable to be on telemetry in CIR. - TSH normal on 12/19/2022. Will recheck BMET and Magnesium today.  - Per chart review, it looks like at some point her Lopressor was restarted. She has been on Lopressor since being discharge to CIR on 12/12/2022. Repeat EKG on admission after Lopressor had been stopped showed normal sinus rhythm with rates in the 70s. Will stop Lopressor.  - Will restart Amiodarone 100mg  daily to avoid issues with atrial fibrillation with RVR.  - Will recheck EKG in the morning. - She is hemodynamically and does not need PPM at this time. Would also like to avoid this  given neurogenic bladder following spinal cord ischemia and increased risk of infection.   Paroxysmal Atrial Fibrillation No issues with atrial fibrillation this admission. Currently in a junctional rhythm as above.  - Will stop Lopressor.  - OK to continue Amiodarone 100mg  daily.  - Continue chronic anticoagulation with Eliquis 2.5mg  twice daily. Reduced dose due to age and weight.    Elevated Troponin Coronary Artery Calcifications  Patient has coronary artery calcifications noted on prior CT. High-sensitivity troponin 29 today after bradycardic episode above. EKG shows some ST depression in inferolateral leads but these are not new.  - No chest pain.  - Delta troponin pending but no ischemic evaluation planned at this time. - Of note, patient is on Aspirin. Would recommend stopping this given patient is on Eliquis.  Hypertension Diastolic BP soft in the 40s to 50s but systolic BP well controlled. - Continue Amlodipine 5mg  daily.  - Will stop Lopressor as above. - Of note, she does have some lower extremity edema. Wonder if Amlodipine is contributing to this. Can use PRN Lasix for edema if needed. Will defer to primary team.  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 The patient's score is based upon: CHF History: 0 HTN History: 1 Diabetes History: 0 Stroke History: 2 Vascular Disease History: 1 Age Score: 2 Gender Score: 1    For questions or updates, please contact [facility] Please consult [email or web address] for contact info under    Signed, Corrin Parker, PA-C  12/30/2022 3:53 PM

## 2022-12-30 NOTE — Progress Notes (Signed)
Physical Therapy Session Note  Patient Details  Name: Heather Molina MRN: [id number] Date of Birth: [birth year]  Today's Date: 12/30/2022 PT Individual Time: 9147-8295 PT Individual Time Calculation (min): 48 min   Today's Date: 12/30/2022 PT Missed Time: 12 Minutes Missed Time Reason: Patient fatigue  Short Term Goals: Week 2:  PT Short Term Goal 1 (Week 2): Pt will complete WC mobility in PWC with supervision 150' navigating busy hallways PT Short Term Goal 2 (Week 2): Pt will require mod assist for supine>sit PT Short Term Goal 3 (Week 2): Pt will tolerate sitting balance on firm surface CGA x1 min PT Short Term Goal 4 (Week 2): Pt will complete SB transfer with mod assist  Skilled Therapeutic Interventions/Progress Updates:      Therapy Documentation Precautions:  Precautions Precautions: Fall Precaution Comments: paraplegia Restrictions Weight Bearing Restrictions: No   Pt lethargic on PT arrival and agreeable to PT. Pt reports left shoulder pain, nurse notified and administered pain medication. Pt supervision with PWC mobility to dayroom and displays good safety awareness with PWC set-up for slide board transfer.   Min/mod A for slide board transfer with increased time and max cues for head/hips relationship. Pt CGA with level transfer to return to [facility] as deferred sitting dynamic balance due to lethargy and pt presentation off compared to baseline.   Pt also reports hot flashes and significant sweating. PT notified MD, PA, and nurse and returned pt to room with dependent slide board transfer to bed. PT started vitals assessment as PA and EKG tech entered room. Pt left in care of medical providers and missed 12 minutes of skilled PT.    Therapy/Group: Individual Therapy  Truitt Leep Truitt Leep PT, DPT  12/30/2022, 8:16 AM

## 2022-12-31 DIAGNOSIS — R195 Other fecal abnormalities: Secondary | ICD-10-CM

## 2022-12-31 DIAGNOSIS — D649 Anemia, unspecified: Secondary | ICD-10-CM

## 2022-12-31 DIAGNOSIS — R5383 Other fatigue: Secondary | ICD-10-CM

## 2022-12-31 LAB — CBC WITH DIFFERENTIAL/PLATELET
Abs Immature Granulocytes: 0.07 10*3/uL (ref 0.00–0.07)
Basophils Absolute: 0 10*3/uL (ref 0.0–0.1)
Basophils Relative: 0 %
Eosinophils Absolute: 0.1 10*3/uL (ref 0.0–0.5)
Eosinophils Relative: 1 %
HCT: 27.6 % — ABNORMAL LOW (ref 36.0–46.0)
Hemoglobin: 8.7 g/dL — ABNORMAL LOW (ref 12.0–15.0)
Immature Granulocytes: 1 %
Lymphocytes Relative: 8 %
Lymphs Abs: 0.9 10*3/uL (ref 0.7–4.0)
MCH: 27.8 pg (ref 26.0–34.0)
MCHC: 31.5 g/dL (ref 30.0–36.0)
MCV: 88.2 fL (ref 80.0–100.0)
Monocytes Absolute: 0.7 10*3/uL (ref 0.1–1.0)
Monocytes Relative: 7 %
Neutro Abs: 8.7 10*3/uL — ABNORMAL HIGH (ref 1.7–7.7)
Neutrophils Relative %: 83 %
Platelets: 244 10*3/uL (ref 150–400)
RBC: 3.13 MIL/uL — ABNORMAL LOW (ref 3.87–5.11)
RDW: 17.9 % — ABNORMAL HIGH (ref 11.5–15.5)
WBC: 10.5 10*3/uL (ref 4.0–10.5)
nRBC: 0 % (ref 0.0–0.2)

## 2022-12-31 LAB — COMPREHENSIVE METABOLIC PANEL
ALT: 25 U/L (ref 0–44)
AST: 31 U/L (ref 15–41)
Albumin: 2.3 g/dL — ABNORMAL LOW (ref 3.5–5.0)
Alkaline Phosphatase: 63 U/L (ref 38–126)
Anion gap: 6 (ref 5–15)
BUN: 51 mg/dL — ABNORMAL HIGH (ref 8–23)
CO2: 22 mmol/L (ref 22–32)
Calcium: 8 mg/dL — ABNORMAL LOW (ref 8.9–10.3)
Chloride: 107 mmol/L (ref 98–111)
Creatinine, Ser: 1.23 mg/dL — ABNORMAL HIGH (ref 0.44–1.00)
GFR, Estimated: 44 mL/min — ABNORMAL LOW (ref >60)
Glucose, Bld: 108 mg/dL — ABNORMAL HIGH (ref 70–99)
Potassium: 4.5 mmol/L (ref 3.5–5.1)
Sodium: 135 mmol/L (ref 135–145)
Total Bilirubin: 0.8 mg/dL (ref <1.2)
Total Protein: 5.5 g/dL — ABNORMAL LOW (ref 6.5–8.1)

## 2022-12-31 LAB — URINALYSIS, ROUTINE W REFLEX MICROSCOPIC
Bilirubin Urine: NEGATIVE
Glucose, UA: NEGATIVE mg/dL
Hgb urine dipstick: NEGATIVE
Ketones, ur: NEGATIVE mg/dL
Nitrite: NEGATIVE
Protein, ur: NEGATIVE mg/dL
Specific Gravity, Urine: 1.021 (ref 1.005–1.030)
pH: 5 (ref 5.0–8.0)

## 2022-12-31 LAB — GLUCOSE, CAPILLARY: Glucose-Capillary: 104 mg/dL — ABNORMAL HIGH (ref 70–99)

## 2022-12-31 LAB — OCCULT BLOOD X 1 CARD TO LAB, STOOL: Fecal Occult Bld: POSITIVE — AB

## 2022-12-31 LAB — MAGNESIUM: Magnesium: 2.6 mg/dL — ABNORMAL HIGH (ref 1.7–2.4)

## 2022-12-31 MED ORDER — SODIUM CHLORIDE 0.9 % IV SOLN: INTRAVENOUS | Status: AC

## 2022-12-31 MED ORDER — OXYCODONE HCL 5 MG PO TABS
2.5000 mg | ORAL_TABLET | Freq: Four times a day (QID) | ORAL | Status: DC | PRN
  Administered 2023-01-08: 2.5 mg via ORAL
  Filled 2022-12-31: qty 1

## 2022-12-31 MED ORDER — SULFAMETHOXAZOLE-TRIMETHOPRIM 800-160 MG PO TABS
1.0000 | ORAL_TABLET | Freq: Two times a day (BID) | ORAL | Status: AC
  Administered 2022-12-31 – 2023-01-03 (×6): 1 via ORAL
  Filled 2022-12-31 (×6): qty 1

## 2022-12-31 MED ORDER — SODIUM CHLORIDE 0.9 % NICU IV INFUSION SIMPLE: INJECTION | INTRAVENOUS | Status: DC

## 2022-12-31 NOTE — Progress Notes (Addendum)
Messaged by nursing and PT that patient is more tired today and did not want to fully participate in therapy. She is sleeping when entering room and is easily awakened. Alert and oriented times 4. She was medicated with Tylenol around midnight last night and also had loose stool. Says she slept well afterward. Received oxycodone 5 mg yesterday at  5:15 pm. No pain currently. No nausea or vomiting. No appetite and did not eat breakfast. Was seen by cardiology team this am. Note is pending. (Note: patient was admitted to rehab already on Lopressor 25 mg BID per acute team orders.)  Neuro intact. Chest: CTA bilaterally RRR  Abdomen soft and non-tender, + BS Discussed with Dr. Benjie Karvonen: his exam similar earlier this morning Will check UA/Cx and repeat lab work  VS: Temperature: 98.4, BP: 143/50 (75), pulse 77, SPO2 100 % on 2 L CBG 104  Cardiology note reviewed. Amiodarone resumed and aspirin discontinued.   Updated daughter in room. We discussed leaving oxycodone but give one-half tablet initially (2.5 mg).  Urinalysis    Component Value Date/Time   COLORURINE AMBER (A) 12/31/2022 1430   APPEARANCEUR HAZY (A) 12/31/2022 1430   LABSPEC 1.021 12/31/2022 1430   PHURINE 5.0 12/31/2022 1430   GLUCOSEU NEGATIVE 12/31/2022 1430   HGBUR NEGATIVE 12/31/2022 1430   BILIRUBINUR NEGATIVE 12/31/2022 1430   KETONESUR NEGATIVE 12/31/2022 1430   PROTEINUR NEGATIVE 12/31/2022 1430   NITRITE NEGATIVE 12/31/2022 1430   LEUKOCYTESUR MODERATE (A) 12/31/2022 1430    Many bacteria, mucous present, 21-50 WBCs. Spoke with daughter and will re-start Bactrim DS and culture urine. H and H stable. No leukocytosis.   CBC    Component Value Date/Time   WBC 10.5 12/31/2022 1455   RBC 3.13 (L) 12/31/2022 1455   HGB 8.7 (L) 12/31/2022 1455   HCT 27.6 (L) 12/31/2022 1455   PLT 244 12/31/2022 1455   MCV 88.2 12/31/2022 1455   MCH 27.8 12/31/2022 1455   MCHC 31.5 12/31/2022 1455   RDW 17.9 (H) 12/31/2022 1455    LYMPHSABS 0.9 12/31/2022 1455   MONOABS 0.7 12/31/2022 1455   EOSABS 0.1 12/31/2022 1455   BASOSABS 0.0 12/31/2022 1455    CMP     Component Value Date/Time   NA 135 12/31/2022 1455   K 4.5 12/31/2022 1455   CL 107 12/31/2022 1455   CO2 22 12/31/2022 1455   GLUCOSE 108 (H) 12/31/2022 1455   BUN 51 (H) 12/31/2022 1455   CREATININE 1.23 (H) 12/31/2022 1455   CALCIUM 8.0 (L) 12/31/2022 1455   PROT 5.5 (L) 12/31/2022 1455   ALBUMIN 2.3 (L) 12/31/2022 1455   AST 31 12/31/2022 1455   ALT 25 12/31/2022 1455   ALKPHOS 63 12/31/2022 1455   BILITOT 0.8 12/31/2022 1455   GFRNONAA 44 (L) 12/31/2022 1455   Will give overnight IVFs NS at 75 cc/hr for one liter due to elevated BUN

## 2022-12-31 NOTE — Progress Notes (Signed)
Occupational Therapy Session Note  Patient Details  Name: Heather Molina MRN: [id number] Date of Birth: Feb 17, 1941  Today's Date: 12/31/2022 OT Individual Time: 0801-0850 OT Individual Time Calculation (min): 49 min  and Today's Date: 12/31/2022 OT Missed Time: 11 Minutes Missed Time Reason: Patient fatigue   Short Term Goals: Week 3:  OT Short Term Goal 1 (Week 3): Pt will complete commode transfer with Mod A OT Short Term Goal 2 (Week 3): Pt will complete static sitting EOM or EOB with consistent SBA  Skilled Therapeutic Interventions/Progress Updates:      Therapy Documentation Precautions:  Precautions Precautions: Fall Precaution Comments: paraplegia Restrictions Weight Bearing Restrictions: No General: "I wish I didn't feel like this still." Pt supine in bed upon OT arrival, agreeable to OT session. Pt presenting with increased fatigue this date with intermittent sleeping throughout session, although easy to wake.  Pain: no pain reported, pt reported pain overnight down center of back until medication started being effective  ADL: Bed mobility: pt rolling with use of bed rails Min A for LE management   Exercises:  Other Treatments:  Pt participated in [facility] activity with pink theraputty and beads, pt retrieving beads  with BUE and placing them into container for increased dexterity and coordination in order to complete ADLs such as buttoning shirts. Pt then using putty to tear into pieces and make putty balls in order to strengthen intrinsic muscles of inner hand and pinch strength for ADLs. Pt taking PRN rest breaks d/t fatigue.   Pt supine in bed with bed alarm activated, 2 bed rails up, call light within reach and 4Ps assessed.   Therapy/Group: Individual Therapy  [name], OTD, OTR/L 12/31/2022, 8:57 AM

## 2022-12-31 NOTE — Progress Notes (Signed)
Physical Therapy Session Note  Patient Details  Name: Heather Molina MRN: [id number] Date of Birth: 08/10/1940  Today's Date: 12/31/2022 PT Individual Time: 0865-7846 PT Individual Time Calculation (min): 40 min   Short Term Goals: Week 2:  PT Short Term Goal 1 (Week 2): Pt will complete WC mobility in PWC with supervision 150' navigating busy hallways PT Short Term Goal 2 (Week 2): Pt will require mod assist for supine>sit PT Short Term Goal 3 (Week 2): Pt will tolerate sitting balance on firm surface CGA x1 min PT Short Term Goal 4 (Week 2): Pt will complete SB transfer with mod assist  Skilled Therapeutic Interventions/Progress Updates: Pt presented in bed with nsg present completing vitals. Pt initially demonstrating significant drowsiness but improved throughout session. Pt agreeable to try to sit EOB this session although significantly fatigued. Nsg requesting pt stay in bed as pt required I/O cath after session. PTA donned TED hose total A and socks. PTA providing LB management off EOB and pt was able to facilitate rolling to R with use of bed rail. Pt completed supine to sit with minA. Pt then worked on sitting balance EOB with pt varying between minA to short bouts of close supervision. Pt also worked on lateral leans, using core to improve anterior lean when needed then using UE to push self back to midline. Pt was able to complete x 4 in each direction with pt initially requiring minA but was able to perform with CGA and maintaining midline with supervision by end of each set. Pt required maxA to return to supine for BLE management. PTA doffed TED hose/socks and performed gentle stretching/ROM to BLE. Pt was able to roll L/R with use of bed rail and supervision to allow PTA to adjust chux pad as well as doff pants in preparation for I/O. Pt repositioned to comfort and left in bed with call bell within reach and needs met.      Therapy Documentation Precautions:  Precautions Precautions:  Fall Precaution Comments: paraplegia Restrictions Weight Bearing Restrictions: No General: PT Amount of Missed Time (min): 17 Minutes PT Missed Treatment Reason: Patient fatigue Vital Signs: Therapy Vitals Temp: 98.3 F (36.8 C) Temp Source: Oral Pulse Rate: 78 Resp: 16 BP: (!) 136/50 Patient Position (if appropriate): Lying Oxygen Therapy SpO2: 100 %   Therapy/Group: Individual Therapy  Lydiah Pong 12/31/2022, 4:02 PM

## 2022-12-31 NOTE — Progress Notes (Addendum)
PROGRESS NOTE   Subjective/Complaints: Patient was seen by cardiology yesterday after she reported hot flash and noted to have bradycardia.  Cardiology recommended holding metoprolol and aspirin.  Patient was noted to have dark stool some streak of blood in her stool this morning.  Patient says she feels fatigued this morning.  Reports she slept well overnight.     ROS:  Pt denies SOB, abd pain, CP, N/V/C/D, and vision changes +intermittent pain in left forewarm + Fatigue-she feels tired this morning   Except for HPI  Objective:   No results found. No results for input(s): "WBC", "HGB", "HCT", "PLT" in the last 72 hours.   Recent Labs    12/30/22 1407  NA 136  K 4.6  CL 106  CO2 21*  GLUCOSE 103*  BUN 42*  CREATININE 1.26*  CALCIUM 8.2*        Intake/Output Summary (Last 24 hours) at 12/31/2022 1155 Last data filed at 12/31/2022 0016 Gross per 24 hour  Intake 240 ml  Output 450 ml  Net -210 ml     Pressure Injury 12/12/22 Sacrum Medial Deep Tissue Pressure Injury - Purple or maroon localized area of discolored intact skin or blood-filled blister due to damage of underlying soft tissue from pressure and/or shear. (Active)  12/12/22 1844  Location: Sacrum  Location Orientation: Medial  Staging: Deep Tissue Pressure Injury - Purple or maroon localized area of discolored intact skin or blood-filled blister due to damage of underlying soft tissue from pressure and/or shear.  Wound Description (Comments):   Present on Admission: Yes     Pressure Injury 12/14/22 Heel Left;Lateral;Posterior Stage 2 -  Partial thickness loss of dermis presenting as a shallow open injury with a red, pink wound bed without slough. (Active)  12/14/22 (first assessed by [facility]) 1221  Location: Heel  Location Orientation: Left;Lateral;Posterior  Staging: Stage 2 -  Partial thickness loss of dermis presenting as a shallow open injury with a  red, pink wound bed without slough.  Wound Description (Comments):   Present on Admission: No     Pressure Injury 12/14/22 Heel Right;Lateral Stage 2 -  Partial thickness loss of dermis presenting as a shallow open injury with a red, pink wound bed without slough. (Active)  12/14/22 (first assessed by [facility]) 1221  Location: Heel  Location Orientation: Right;Lateral  Staging: Stage 2 -  Partial thickness loss of dermis presenting as a shallow open injury with a red, pink wound bed without slough.  Wound Description (Comments):   Present on Admission: No     Pressure Injury 12/14/22 Ischial tuberosity Left;Medial Stage 2 -  Partial thickness loss of dermis presenting as a shallow open injury with a red, pink wound bed without slough. blister (Active)  12/14/22 (first assessed by [facility] nurse) 1221  Location: Ischial tuberosity  Location Orientation: Left;Medial  Staging: Stage 2 -  Partial thickness loss of dermis presenting as a shallow open injury with a red, pink wound bed without slough.  Wound Description (Comments): blister  Present on Admission: No     Physical Exam: Vital Signs Blood pressure (!) 143/50, pulse (!) 55, temperature 98.4 F (36.9 C), resp. rate 16, height 5' (  1.524 m), weight 50.5 kg, SpO2 100%.    General: awake, alert, appropriate, frail, sitting up in bed, NAD HENT: conjugate gaze; oropharynx moist CV: RRR, no MRG Pulmonary: CTAB, nonlabored breathing GI: soft, NT, ND, (+)BS- slightly hypoactive Psychiatric: appropriate-interactive Neurological: Ox3 Skin: Warm and dry Neuro: Alert and oriented x 4, follows commands, speech or language deficits noted Moving bilateral upper extremities to gravity and resistance.  No significant movement noted in bilateral lower extremities.  Sensation intact light touch in all 4 extremities.  Cranial nerves II through XII grossly intact.  Prior exam MS; was able to move RLE< but from abd muscles- not in LE muscles- no movement  seen in LLE either-  Skin: Groin incision site looks okay, bruising noted bilateral shins 2 IVs in place left upper extremity looks okay Extremities: unable to palpate L radial pulse but dopplers confirm pulse with rate in 60-70s, arm well perfused appearing, warm and pink, motor function preserved. Brachial pulse also unable to be felt. R radial pulse strong and palpable.    Neuro:   Mental Status: AAOx3, memory intact,  Speech/Languate:  fluent, follows simple commands CRANIAL NERVES: II: PERRL. Visual fields full III, IV, VI: EOM intact, no gaze preference or deviation V: normal sensation bilaterally VII: no asymmetry VIII: normal hearing to speech IX, X: normal palatal elevation XI: 5/5 head turn and 5/5 shoulder shrug bilaterally XII: Tongue midline     MOTOR: RUE: 5/5 Deltoid, 5/5 Biceps, 5/5 Triceps,5/5 Grip LUE: 5/5 Deltoid, 5/5 Biceps, 5/5 Triceps, 5/5 Grip RLE: 0 out of 5 throughout LLE: 0 out of 5 throughout, exam stable 11/6   SENSORY: Normal to touch all 4 extremities to light touch in bilateral upper and lower extremities, patchy altered sensation to cold in lower extremities   No ankle clonus bilaterally   Coordination: Normal finger to nose, no dysmetria  Assessment/Plan: 1. Functional deficits which require 3+ hours per day of interdisciplinary therapy in a comprehensive inpatient rehab setting. Physiatrist is providing close team supervision and 24 hour management of active medical problems listed below. Physiatrist and rehab team continue to assess barriers to discharge/monitor patient progress toward functional and medical goals  Care Tool:  Bathing    Body parts bathed by patient: Right arm, Left arm, Chest, Abdomen, Front perineal area, Face, Right upper leg, Left upper leg   Body parts bathed by helper: Right lower leg, Left lower leg, Buttocks     Bathing assist Assist Level: Minimal Assistance - Patient > 75% (roll in shower chair)     Upper  Body Dressing/Undressing Upper body dressing   What is the patient wearing?: Pull over shirt    Upper body assist Assist Level: Supervision/Verbal cueing    Lower Body Dressing/Undressing Lower body dressing      What is the patient wearing?: Incontinence brief, Pants     Lower body assist Assist for lower body dressing: Maximal Assistance - Patient 25 - 49%     Toileting Toileting    Toileting assist Assist for toileting: Total Assistance - Patient < 25%     Transfers Chair/bed transfer  Transfers assist  Chair/bed transfer activity did not occur: Safety/medical concerns  Chair/bed transfer assist level: Minimal Assistance - Patient > 75%     Locomotion Ambulation   Ambulation assist   Ambulation activity did not occur: Safety/medical concerns (paraplegia, fatigue)          Walk 10 feet activity   Assist  Walk 10 feet activity did not occur:  Safety/medical concerns (paraplegia, fatigue)        Walk 50 feet activity   Assist Walk 50 feet with 2 turns activity did not occur: Safety/medical concerns (paraplegia, fatigue)         Walk 150 feet activity   Assist Walk 150 feet activity did not occur: Safety/medical concerns (paraplegia, fatigue)         Walk 10 feet on uneven surface  activity   Assist Walk 10 feet on uneven surfaces activity did not occur: Safety/medical concerns (paraplegia, fatigue)         Wheelchair     Assist Is the patient using a wheelchair?: Yes Type of Wheelchair: Power Wheelchair activity did not occur: Safety/medical concerns (paraplegia, fatigue)  Wheelchair assist level: Minimal Assistance - Patient > 75% Max wheelchair distance: 200    Wheelchair 50 feet with 2 turns activity    Assist    Wheelchair 50 feet with 2 turns activity did not occur: Safety/medical concerns (paraplegia, fatigue)   Assist Level: Minimal Assistance - Patient > 75%   Wheelchair 150 feet activity     Assist   Wheelchair 150 feet activity did not occur: Safety/medical concerns (paraplegia, fatigue)   Assist Level: Minimal Assistance - Patient > 75%   Blood pressure (!) 143/50, pulse (!) 55, temperature 98.4 F (36.9 C), resp. rate 16, height 5' (1.524 m), weight 50.5 kg, SpO2 100%.  Medical Problem List and Plan: 1. 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  Antithrombotics: -DVT/anticoagulation:  Pharmaceutical: Eliquis 2.5mg  BID             -antiplatelet therapy: Aspirin 81 mg daily- discontinued per cardiology recommendation   3. Pain Management: continue Tylenol, robaxin, oxycodone as needed 10/24- will wait to try Tramadol for pain/discomfort til have tried  Remeron 7.5mg  for sleep 10/31- stopped Remeron 4. Mood/Behavior/Sleep: LCSW to evaluate and provide emotional support             -continue paroxetine 20 mg daily (hx of depression)             -antipsychotic agents: n/a -12/13/22 hasn't been sleeping well, increase melatonin to 10mg  QHS, added Trazodone 25mg  PRN but advised to be cautious about using it too late.  10/21- will stop Trazodone- can impair ability to pee- will start Restoril 7.5 mg at bedtime for sleep.  10/22- cannot start Remeron due to risk of torsades de pointes- will con't Restoril  10/23- didn't sleep well again- will increase Restoril to 15 mg at bedtime 10/24- spoke to Cards- they are ok trying Remeron but wait on Tramadol-will start 15 mg at bedtime -will stop Restoril>> down to 7.5mg  -12/19/22 slept "wonderfully"! Cont regimen 10/28- Slept poorly- still on rmeron- sounds like every other night she slept well 10/30- sleeping better 10/31- stopped remeron- slept ok yesterday as well  11/5- slept fair, not great 11/6 reports  she slept well last night 5. Neuropsych/cognition: This patient is capable of making decisions on her own behalf.   6. Skin/Wound Care: Routine skin care checks   7. Fluids/Electrolytes/Nutrition: Routine Is and Os and follow-up chemistries  8: Hypertension: monitor TID and prn (hx of a.fib see meds below). Monitor with mobility. Continue metoprolol 25mg  BID  -12/13/22 BPs a little up, monitor for trend 10/21- BP's running high up to 180s- systolic- but had documented BP 87/64 this AM- will wait to titrate BP meds. 10/22- BP still in 150s- but no LOW BP's this AM- will see how does in therapy- 10/23- reduced Metoprolol per pt request to 12.5 mg BID- might need Norvasc for BP control without affecting HR  10/24- will add Norvasc 2.5 mg daily since BP elevated and not coming down  10/25- calling IM- maybe they can help- just added Norvasc yesterday -12/19/22 BPs improving a little this morning, monitor -12/20/22 BPs still a tad elevated but stable, monitor a couple more days before deciding on changes for now 10/28- BP on low side, but Hb low- will transfuse 2 units 10/29- BP looking more labile- but better than it was- monitor for now 11/1: continue current regimen -12/26/22 BPs variable, monitor trend for now 11/5- BP running 130s- 170's- Will increase Norvasc to 5 mg daily  11/6 BP stable, metoprolol discontinued by cardiology Vitals:   12/28/22 1321 12/28/22 1434 12/28/22 1948 12/29/22 1615  BP: (!) 157/56 (!) 156/64 (!) 137/91 (!) 148/51   12/29/22 2017 12/29/22 2017 12/30/22 0411 12/30/22 1513  BP: (!) 132/52 (!) 132/52 (!) 139/50 (!) 127/45   12/30/22 1930 12/31/22 0453 12/31/22 0928 12/31/22 1040  BP: (!) 114/44 (!) 131/46 (!) 128/45 (!) 143/50                 9: Hyperlipidemia: continue pravastatin 40mg  daily   10: COPD, O2 dependent 2L via [place]:             -continue Brovana nebs BID             -continue Yupelri neb daily   10/21- Has been on 2L O2 at home per pt and  chart  10/25- ABG shows CO2 too low- needs to reduce O2 levels  10/31- Sats ok- con't O2  11: CAD: continue asa and statin   12: Atrial fibrillation, paroxysmal: maintained on Eliquis reversed pre-operatively             -continue amiodarone 100 mg daily             -continue metoprolol tartrate 25 mg BID             -cleared by [facility] to re-start Eliquis>pharmacy consult placed   10/25- reduced Metoprolol to 12.5 mg BID earlier in week - doing better  10/30- BP doing better  13: 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 10/22- not voiding -U/A (-)  will add Flomax 0.4 mg q supper to see if she can void- I don't think it's likely. D/w nursing due to low amount  of caths-  -12/19/22 U/A overnight looks infectious, will start Bactrim DS BID x5d, UCx pending; monitor -12/20/22 UCx with >100k CFU of K. Pneumoniae, sensitive to bactrim, cont abx course 10/29- Being cathed- volumes low- pushing fluids, but will also give 1 L IVFs 10/30- not drinking- Got IVFs but BUN up to 45 and Cr 1.41- will call renal- and recheck in AM-- a little improved on 10/31 labs 11/5- drinking much better- volumes looking better- and Cr down to 1.16 and BUN 29 from 40's 17: ABLA: stable; follow CBC trend             -consider oral iron supplement -12/13/22 Hgb slowly downtrending from postop 8.8 on 10/14>7.6>7.5>7.3>7.0 yesterday; pt  asymptomatic at this time; would recheck tomorrow but if still at the 7.0-range then would consider transfusion to help with therapy tolerance; unclear what her prior baseline was (has hgb 12.2 back in July but then down to 9's).    10/21- Hb 7.4 today  10/23- Hb 7.2 yesterday- will check in AM 10/24- Hb down to 7.0- will d/w pt if she wants to be transfused. If so, will give 1 unit pRBCs.  Doesn't have location of losing blood- will check FOBT x3  10/25- Hb 8.1- likely too dry  10/28- Hb 6.7- will transfuse 2 units 10/29- Hb much better AND pt feels MUCH better per pt- Hb up to 9.9- likely so high since pt really dry- will also rewrite for FOBT- hasn't been done  10/30- no bowel results- so cannot check FOBT yet.  10/31- had 2 extra large Bms last night- didn't check FOBT_ will order again. Will d/w nursing 11/4: Hgb reviewed and is stable 11/5- Hb 9.2 18: AKI: improving; CMP 12/12/22 showing Cr 1.31; monitor Monday BMPs 10/21- Cr 1.09 down from 1.31 and BUN 25 down from 25- however is drinking poorly and has poor output- 10/23- Says drinking better- will check BMP in AM  10/25- Cr 0.9 and BUN slightly high at 22- but based on Hb increase, likely dry 10/28- Cr 1.4 and BUN elevated- will give blood and recheck BMP in AM 10/29- BUN up to 40 and Cr 1.41- will need to give IVFs- tried seeing if blood would help, but still needs fluids- will her BUN/Cr- will replete 75 cc/hour after therapy today and recheck BMP in AM- of note, her Cr was running ~ 1.0 and her BUN ~ 25- so big change- will push fluids as well- will give 75cc/hour for 1 liter  10/31- likely form Septra- will stop ABX-  11/4: Cr reviewed and is improving 11/5- Cr 1.16 and BUN 29 down from 40's 11/7 recheck labs today 19: Likely neurogenic bowel: bowel program ordered -Dulcolax suppository ordered -12/13/22 per pt LBM yesterday, monitor 10/21- LBM this afternoon/AM- incontinent- educated pt NEEDs bowel program since not having Bms  at the time to avoid therapies- going when doesn't want to- this will train gut to go when we wants her to go.  10/22- continue to refuse bowel program-in spite of education of bowel program and need for it.  10/23- pt did last night- had BM with bowel program and one this AM, but educated can take 3-6 weeks to train gut, but should be better- had bowel incontinence for 20 years 10/24- no bowel program documented  but had small BM's at midnight and 5am- said wasn't incontinent?will check on this 10/25- asked pt to get more dig stim this evening since likely cause of bowel incontinent in AM -12/20/22 reports  BM this morning but no BM since 12/17/22 documented, no report to nursing staff given; monitor closely but if no BM by tomorrow may need to consider additional adjustments 10/29- LBM yesterday AM, and small with bowel program 10/27-no results last night- if no BM tonight, will give sorbitol/check KUB  10/30- no significant BM for 3 days- will give Sorbitol 30cc at 2pm for bowel program- also add Senna 1 tab daily for meds- 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  -LBM 11/3, continue current regimen Reviewed chart and had BM 11/5 -11/7 LBM today  20. Poor Appetite 10/22- will add Megace for poor appetite since cannot add Remeron and too sedated to add Periactin. Will also order push fluids since not drinking great- although her BUN?Cr looking better- BUN 25 and Cr 1.08-  10/23- pt feels she's drinking 6+ cups/day- ate muffin and some eggs this AM- better than last 2 days-  10/24- 25% of tray at most- will add Remeron 15 mg at bedtime- ok'd by Cards.  10/25- changed remeron to 7.5 mg QHS 10/30- won't eat anything for breakfast- small appetite overall-  10/31- ate 1 pancake this AM- better than normal  21. Prolonged QT interval 10/24- spoke with Cards- they suggested starting Remeron 15 mg at bedtime and then rechecking EKG ina few days to see if can add Tramadol. 10/29- doing ok  with remeron- wait to add tramadol for now    22. Oversedation/lethargy/ UTI 10/25- septis and ABG work up- when speaking with PA- appeared to have ST elevation- asked them to call IM consult-  -12/19/22 sleepy but arousable today, U/A+, started bactrim as above, awaiting UCx; CT head neg last night, CBC/BMP stable, TSH/T4 WNL, CXR stable; suspect UTI as source. Monitor. See above #16 11/6: alert and bright this morning  11/7 -Patient alert and oriented x 4, following commands.  Vital signs stable.  Will recheck lab work and UA culture.  Overall she appears stable this morning although fatigue may be waxing and waning.  23. L radial pulse nonpalpable but dopplerable, BPs different in that arm-- unclear when this started, had some pain in L deltoid area yesterday but seemed to be better today. Suspect this pulse/BP discrepancy is 2/2 her vascular issue already, but reached out to vascular surgery to verify. Dr. Karin Lieu returned page, stated this is known since her surgery; nothing to do or be concerned about.   24. Pulsatile abdominal aorta: not noticed previously, not mentioned in notes, but today 12/20/22 I noticed it was very prominently pulsatile and pt reported it being sore in that area; spoke with Dr. Karin Lieu again of [facility], recommended getting CTA Abd/pelv to evaluate for AAA. 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 Bradycardia  -11/7 patient is being followed by cardiology.  Metoprolol has been stopped and ASA duration was recommended-ASA has been stopped.  26. Dark stool  -Check hemocult, recheck CBC  LOS: 19 days A FACE TO FACE EVALUATION WAS PERFORMED  Fanny Dance 12/31/2022, 11:55 AM

## 2022-12-31 NOTE — Plan of Care (Signed)
  Problem: Consults Goal: RH SPINAL CORD INJURY PATIENT EDUCATION Description:  See Patient Education module for education specifics.  Outcome: Progressing   Problem: SCI BOWEL ELIMINATION Goal: RH STG MANAGE BOWEL WITH ASSISTANCE Description: STG Manage Bowel with mod Assistance. Outcome: Progressing   Problem: SCI BLADDER ELIMINATION Goal: RH STG MANAGE BLADDER WITH ASSISTANCE Description: STG Manage Bladder With mod Assistance Outcome: Progressing   Problem: RH SKIN INTEGRITY Goal: RH STG SKIN FREE OF INFECTION/BREAKDOWN Description: Incisions will continue to heal and skin be free of infection/breakdown with min assist  Outcome: Progressing Goal: RH STG MAINTAIN SKIN INTEGRITY WITH ASSISTANCE Description: STG Maintain Skin Integrity With min Assistance. Outcome: Progressing Goal: RH STG ABLE TO PERFORM INCISION/WOUND CARE W/ASSISTANCE Description: STG Able To Perform Incision/Wound Care With min Assistance. Outcome: Progressing   Problem: RH SAFETY Goal: RH STG ADHERE TO SAFETY PRECAUTIONS W/ASSISTANCE/DEVICE Description: STG Adhere to Safety Precautions With cueing Assistance/Device. Outcome: Progressing Goal: RH STG DECREASED RISK OF FALL WITH ASSISTANCE Description: STG Decreased Risk of Fall With cueing Assistance. Outcome: Progressing   Problem: RH PAIN MANAGEMENT Goal: RH STG PAIN MANAGED AT OR BELOW PT'S PAIN GOAL Description: Less than 4 with PRN medications min assist  Outcome: Progressing   Problem: RH KNOWLEDGE DEFICIT SCI Goal: RH STG INCREASE KNOWLEDGE OF SELF CARE AFTER SCI Description: Patient/caregiver will be able to manage medications, bowel and bladder care, as well as skin care from nursing education and nursing handouts independently  Outcome: Progressing   Problem: Education: Goal: Knowledge of discharge needs will improve Outcome: Progressing   Problem: Clinical Measurements: Goal: Postoperative complications will be avoided or  minimized Outcome: Progressing   Problem: Respiratory: Goal: Ability to achieve and maintain a regular respiratory rate will improve Outcome: Progressing   Problem: Skin Integrity: Goal: Demonstration of wound healing without infection will improve Outcome: Progressing

## 2022-12-31 NOTE — Progress Notes (Signed)
IP Rehab Bowel Program Documentation   Bowel Program Start time 2000   Dig Stim Indicated? Yes  Dig Stim Prior to Suppository or mini Enema X 1   Output from dig stim: Moderate  Ordered intervention: Suppository Yes , mini enema No ,   Repeat dig stim after Suppository or Mini enema  X 2,  Output? Smear  Bowel Program Complete? Yes , handoff given yes  Patient Tolerated? Yes    Dominica Severin ...........RN

## 2022-12-31 NOTE — Progress Notes (Signed)
Patient Name: Heather Molina Date of Encounter: 12/31/2022 [facility] Cardiologist: Garwin Brothers, MD   Interval Summary  .    RN reported multiple loose, dark stools overnight.   Vital Signs .    Vitals:   12/30/22 2008 12/31/22 0453 12/31/22 0812 12/31/22 0928  BP:  (!) 131/46  (!) 128/45  Pulse: (!) 55 (!) 55    Resp: 16 16    Temp:  98.5 F (36.9 C)    TempSrc:      SpO2: 100% 100% 98%   Weight:      Height:        Intake/Output Summary (Last 24 hours) at 12/31/2022 0937 Last data filed at 12/31/2022 0016 Gross per 24 hour  Intake 240 ml  Output 450 ml  Net -210 ml      12/30/2022   12:00 PM 12/14/2022    5:56 AM 12/13/2022    5:03 AM  Last 3 Weights  Weight (lbs) 111 lb 5.3 oz 110 lb 10.7 oz 110 lb 7.2 oz  Weight (kg) 50.5 kg 50.2 kg 50.1 kg      Telemetry/ECG    No telemetry available.  EKG 12/31/22: Junctional bradycardia.  Rate 55 bpm.  Nonspecific ST abnormalities.  Physical Exam .    VS:  BP (!) 143/50   Pulse (!) 55   Temp 98.4 F (36.9 C)   Resp 16   Ht 5' (1.524 m)   Wt 50.5 kg   SpO2 100%   BMI 21.74 kg/m  , BMI Body mass index is 21.74 kg/m. GENERAL:  Chronically ill-appearing.  Frail.  HEENT: Pupils equal round and reactive, fundi not visualized, oral mucosa unremarkable NECK:  No jugular venous distention, waveform within normal limits, carotid upstroke brisk and symmetric, no bruits, no thyromegaly LUNGS:  Clear to auscultation bilaterally HEART:  RRR.  PMI not displaced or sustained,S1 and S2 within normal limits, no S3, no S4, no clicks, no rubs, no murmurs ABD:  Flat, positive bowel sounds normal in frequency in pitch, no bruits, no rebound, no guarding, no midline pulsatile mass, no hepatomegaly, no splenomegaly EXT:  L radial pulse absent.  1+ LE edema, no cyanosis no clubbing SKIN:  No rashes no nodules NEURO:  Cranial nerves II through XII grossly intact.  +paraplegia PSYCH:  Cognitively intact, oriented to  person place and time   Assessment & Plan .     Ms. 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  This was complicated by spinal cord ischemia and paraplegia.  Cardiology was consulted for bradycardia.  # Bradycardia:  Patient had an episode of bradycardia while working with physical therapy.  She reports that she has had hot flashes since her 73s.  She experienced a hot flash with PT.  When they checked her heart rate has been as low as the 40s.  She has a history of junctional bradycardia.  Metoprolol was previously stopped.  At some point in rehab it was restarted.  Last dose was in the AM 12/30/22.  She is doing well today and denies recurrent episodes.  No plan for PPM.   # PAF: She has not been in atrial fibrillation.  Okay to continue the amiodarone 100 mg daily.  Holding beta-blocker as above.  Continue the Eliquis which has been appropriately dosed.   #  Nonobstructive CAD: # Hyperlipidemia: Recommend holding the aspirin, especially given her history of intramural hematoma of the aorta.  Continuing Eliquis as above.  Continue pravastatin.   # Hypertension: Continue amlodipine. Metoprolol was discontinued.    For questions or updates, please contact [facility] Please consult [email or web address] for contact info under        Signed, Chilton Si, MD

## 2022-12-31 NOTE — Progress Notes (Signed)
Physical Therapy Session Note  Patient Details  Name: Heather Molina MRN: [id number] Date of Birth: 1940-11-02  Today's Date: 12/31/2022 PT Individual Time: 1015-1043 PT Individual Time Calculation (min): 28 min  and Today's Date: 12/31/2022 PT Missed Time: 17 Minutes Missed Time Reason: Patient fatigue  Short Term Goals: Week 2:  PT Short Term Goal 1 (Week 2): Pt will complete WC mobility in PWC with supervision 150' navigating busy hallways PT Short Term Goal 2 (Week 2): Pt will require mod assist for supine>sit PT Short Term Goal 3 (Week 2): Pt will tolerate sitting balance on firm surface CGA x1 min PT Short Term Goal 4 (Week 2): Pt will complete SB transfer with mod assist  Skilled Therapeutic Interventions/Progress Updates:      Therapy Documentation Precautions:  Precautions Precautions: Fall Precaution Comments: paraplegia Restrictions Weight Bearing Restrictions: No  Pt received semi-reclined in bed in conversation with daughter, [name], on phone. Pt's daughter requested to speak with PT. Daughter expressed concerns regarding bradycardia, lethargy and slurred speech of patient and requested phone call with medical team. Pt fell asleep during PT's phone conversation with patient's daughter. PT notified charge nurse of continued lethargy and pt's family concerns. Nurse notified PA and PT assessed vitals:   Temperature: 98.4   Pulse: 77   SPO2: 100   BP: 143/50 (75)  Pt missed minutes of skilled PT as unable to participate 2/2 lethargy, plan to make up missed minutes as able.     Therapy/Group: Individual Therapy  Truitt Leep Truitt Leep PT, DPT  12/31/2022, 7:54 AM

## 2022-12-31 NOTE — Progress Notes (Signed)
Per previous shift, pt had multiple loose dark stool. Pt refused bowel program at hs. Writer noted streak of blood in stool(smear). Unable to take sample for blood occult card. Will follow up with provider in am. Jesusita Oka. A.  (PA) notified. No new orders received.  Dominica Severin ....................RN

## 2023-01-01 DIAGNOSIS — K921 Melena: Secondary | ICD-10-CM

## 2023-01-01 DIAGNOSIS — D500 Iron deficiency anemia secondary to blood loss (chronic): Secondary | ICD-10-CM

## 2023-01-01 LAB — BASIC METABOLIC PANEL
Anion gap: 8 (ref 5–15)
BUN: 33 mg/dL — ABNORMAL HIGH (ref 8–23)
CO2: 22 mmol/L (ref 22–32)
Calcium: 7.9 mg/dL — ABNORMAL LOW (ref 8.9–10.3)
Chloride: 107 mmol/L (ref 98–111)
Creatinine, Ser: 0.97 mg/dL (ref 0.44–1.00)
GFR, Estimated: 58 mL/min — ABNORMAL LOW (ref >60)
Glucose, Bld: 125 mg/dL — ABNORMAL HIGH (ref 70–99)
Potassium: 3.9 mmol/L (ref 3.5–5.1)
Sodium: 137 mmol/L (ref 135–145)

## 2023-01-01 LAB — CBC
HCT: 26.1 % — ABNORMAL LOW (ref 36.0–46.0)
Hemoglobin: 8.2 g/dL — ABNORMAL LOW (ref 12.0–15.0)
MCH: 27.2 pg (ref 26.0–34.0)
MCHC: 31.4 g/dL (ref 30.0–36.0)
MCV: 86.7 fL (ref 80.0–100.0)
Platelets: 247 10*3/uL (ref 150–400)
RBC: 3.01 MIL/uL — ABNORMAL LOW (ref 3.87–5.11)
RDW: 17.5 % — ABNORMAL HIGH (ref 11.5–15.5)
WBC: 6.7 10*3/uL (ref 4.0–10.5)
nRBC: 0 % (ref 0.0–0.2)

## 2023-01-01 MED ORDER — PANTOPRAZOLE SODIUM 40 MG PO TBEC
40.0000 mg | DELAYED_RELEASE_TABLET | Freq: Two times a day (BID) | ORAL | Status: DC
  Administered 2023-01-01 – 2023-01-12 (×21): 40 mg via ORAL
  Filled 2023-01-01 (×21): qty 1

## 2023-01-01 NOTE — Progress Notes (Signed)
Patient Name: Heather Molina Date of Encounter: 01/01/2023 [facility] Cardiologist: Garwin Brothers, MD   Interval Summary  .    No cardiac events.  HR stable.   Vital Signs .    Vitals:   12/31/22 1307 12/31/22 2008 12/31/22 2047 01/01/23 0321  BP: (!) 136/50 (!) 145/50  (!) 163/58  Pulse: 78 79 78 86  Resp: 16 16 16 16   Temp: 98.3 F (36.8 C) 98.2 F (36.8 C)  98.3 F (36.8 C)  TempSrc: Oral     SpO2: 100% 100% 99% 100%  Weight:      Height:        Intake/Output Summary (Last 24 hours) at 01/01/2023 1053 Last data filed at 01/01/2023 0720 Gross per 24 hour  Intake 480 ml  Output 1557 ml  Net -1077 ml      12/30/2022   12:00 PM 12/14/2022    5:56 AM 12/13/2022    5:03 AM  Last 3 Weights  Weight (lbs) 111 lb 5.3 oz 110 lb 10.7 oz 110 lb 7.2 oz  Weight (kg) 50.5 kg 50.2 kg 50.1 kg      Telemetry/ECG    No telemetry available.  EKG 12/31/22: Junctional bradycardia.  Rate 55 bpm.  Nonspecific ST abnormalities.  Physical Exam .    VS:  BP (!) 163/58 (BP Location: Right Arm)   Pulse 86   Temp 98.3 F (36.8 C)   Resp 16   Ht 5' (1.524 m)   Wt 50.5 kg   SpO2 100%   BMI 21.74 kg/m  , BMI Body mass index is 21.74 kg/m. GENERAL:  Chronically ill-appearing.  Frail.  HEENT: Pupils equal round and reactive, fundi not visualized, oral mucosa unremarkable NECK:  No jugular venous distention, waveform within normal limits, carotid upstroke brisk and symmetric, no bruits, no thyromegaly LUNGS:  Clear to auscultation bilaterally HEART:  RRR.  PMI not displaced or sustained,S1 and S2 within normal limits, no S3, no S4, no clicks, no rubs, no murmurs ABD:  Flat, positive bowel sounds normal in frequency in pitch, no bruits, no rebound, no guarding, no midline pulsatile mass, no hepatomegaly, no splenomegaly EXT:  L radial pulse absent.  1+ LE edema, no cyanosis no clubbing SKIN:  No rashes no nodules NEURO:  Cranial nerves II through XII grossly intact.   +paraplegia PSYCH:  Cognitively intact, oriented to person place and time   Assessment & Plan .     Ms. Heinz is an 25F with PAF, hypertension, non-obstructive CAD, severe COPD on home O2, and tobacco admitted with an intramural hematoma of the thoracic aorta and subsequent TEVAR with R femoral endarterectomy and patch angioplasty.  This was complicated by spinal cord ischemia and paraplegia.  Cardiology was consulted for bradycardia.  # Bradycardia:  Patient had an episode of bradycardia while working with physical therapy.  She reports that she has had hot flashes since her 61s.  She experienced a hot flash with PT.  When they checked her heart rate has been as low as the 40s.  She has a history of junctional bradycardia.  Metoprolol was previously stopped.  At some point in rehab it was restarted.  Last dose was in the AM 12/30/22. Heart rate remains stable.    # PAF: She has not been in atrial fibrillation.  Okay to continue the amiodarone 100 mg daily.  Avoiding nodal agents as above.  Continue the Eliquis which has been appropriately dosed.   # Nonobstructive  CAD: # Hyperlipidemia: Recommend holding the aspirin, especially given her history of intramural hematoma of the aorta.  Continuing Eliquis as above.  Continue pravastatin.   # Hypertension: Continue amlodipine. Metoprolol was discontinued.  BP uncontrolled but may be allowing permissive HTN in the setting of heme positive stools.  Given her LE edema, would not further titrate amlodipine.  Recommend adding ARB for BP control when stable.   # GI bleed: Hemoccult +.  Management per primary team.  [facility] will sign off.   Medication Recommendations:  Consider adding ARB Other recommendations (labs, testing, etc):  n/a Follow up as an outpatient:  within one month after discharge home    For questions or updates, please contact [facility] Please consult [email or web address] for contact info under         Signed, Chilton Si, MD

## 2023-01-01 NOTE — Progress Notes (Signed)
Patient unable to tolerate OT treatment this am. Will continue to work towards patient goals, building endurance and ADL independence, as her tolerance allows.

## 2023-01-01 NOTE — Consult Note (Addendum)
Consultation  Referring Provider:  [facility]  Primary Care Physician:  Hadley Pen, MD Primary Gastroenterologist:  Gentry Fitz       Reason for Consultation:     melena, anemia  LOS: 20 days          HPI:   Heather Molina is a 82 y.o. female with past medical history significant for paroxysmal A-fib on Eliquis, paraplegia after TEVAR early October 2024, presents for evaluation of melena and anemia.  Patient presented to [facility] 12/06/2022 with acute chest and back pain.  There was concern of possible active bleeding from thoracic aortic aneurysm.  Patient underwent TEE EVAR with coverage of LSA, right femoral endarterectomy and patch angioplasty.  Upon being extubated and transferring to the ICU she was unable to move her feet postop day 1.  Likely cord ischemia.  She is now in rehab and yesterday reported some dark stools.  Hemoglobin 2 weeks ago was 6.6 and she received 2 units PRBCs.  After her 2 units PRBCs hemoglobin increased to 9.9 and has steadily drifted down over the last 10 days to 8.7.  Did not start having melena until yesterday.  Patient states she is unaware if she is having melena. Denies GERD. Reports if she is not "sat up straight enough" to eat her meals while she's been in rehab she's had some mild dysphagia. Denies previous EGD. Denies NSAIDs. On Eliquis.  PREVIOUS GI WORKUP   2012 colonoscopy with Dr. Chales Abrahams in [place] - no polyps  Past Medical History:  Diagnosis Date   Acute foot pain, right 04/15/2022   Acute on chronic respiratory failure with hypoxia (HCC) 08/30/2022   Age-related osteoporosis without current pathological fracture 04/06/2018   ALLERGIC RHINITIS 05/20/2009   Qualifier: Diagnosis of   By: Excell Seltzer CMA, Lawson Fiscal      Replacing diagnoses that were inactivated after the 05/25/22 regulatory import   Anxiety    Arthritis, multiple joint involvement 04/06/2018   C O P D 05/20/2009   Qualifier: Diagnosis of   By: Excell Seltzer CMA, Lori       Replacing diagnoses that were inactivated after the 05/25/22 regulatory import   CAD (coronary artery disease) 06/29/2022   Calculus of gallbladder with chronic cholecystitis without obstruction 04/30/2016   CAP (community acquired pneumonia) 05/13/2019   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/26/2020   Cigarette nicotine dependence without complication 12/26/2020   COPD with acute exacerbation (HCC) 08/30/2022   Elevated brain natriuretic peptide (BNP) level 08/30/2022   G E R D 05/20/2009   Qualifier: Diagnosis of   By: Excell Seltzer CMA, Lori       Generalized edema 08/08/2019   History of CVA (cerebrovascular accident) 12/26/2020   Hypertension    Hypoxia 05/13/2019   Irritable bowel syndrome with diarrhea 09/27/2019   Leukocytosis 05/13/2019   Lipoma of torso 04/07/2019   Mixed dyslipidemia 04/06/2018   NSVT (nonsustained ventricular tachycardia) (HCC) 05/13/2019   Paroxysmal atrial fibrillation (HCC) 06/29/2022   Prolonged Q-T interval on ECG 05/13/2019   Recurrent major depressive disorder, in full remission (HCC) 04/06/2018   Respiratory infection 04/15/2022   Right inguinal hernia 03/02/2018   Sebaceous cyst 04/07/2019   TOBACCO ABUSE 05/21/2009   Qualifier: Diagnosis of   By: Craige Cotta MD, Vineet        Surgical History:  She  has a past surgical history that includes Cholecystectomy; Hernia repair; Thoracic aortic endovascular stent graft (N/A, 12/07/2022); Ultrasound guidance for vascular access (  12/07/2022); Patch angioplasty (Right, 12/07/2022); and Endarterectomy femoral (Right, 12/07/2022). Family History:  Her family history includes CVA in her father; Epilepsy in her father. Social History:   reports that she has quit smoking. She has never used smokeless tobacco. She reports that she does not currently use alcohol. She reports that she does not currently use drugs.  Prior to Admission medications   Medication Sig Start Date End Date Taking? Authorizing  Provider  acetaminophen (TYLENOL) 500 MG tablet Take 500 mg by mouth as needed for moderate pain.    [provider]  amiodarone (PACERONE) 200 MG tablet Take 0.5 tablets (100 mg total) by mouth daily. 12/03/22   Revankar, Aundra Dubin, MD  aspirin 81 MG EC tablet Take 81 mg by mouth daily.    [provider]  BREZTRI AEROSPHERE 160-9-4.8 MCG/ACT AERO Inhale 2 puffs into the lungs in the morning and at bedtime. 06/19/22   [provider]  cholecalciferol (VITAMIN D3) 25 MCG (1000 UNIT) tablet Take 1,000 Units by mouth daily.    [provider]  diphenoxylate-atropine (LOMOTIL) 2.5-0.025 MG tablet Take 1 tablet by mouth as needed for diarrhea or loose stools.    [provider]  ELIQUIS 2.5 MG TABS tablet Take 2.5 mg by mouth 2 (two) times daily. 06/12/22   [provider]  levalbuterol Pauline Aus HFA) 45 MCG/ACT inhaler Inhale into the lungs.    [provider]  levalbuterol (XOPENEX) 1.25 MG/0.5ML nebulizer solution Take 1.25 mg by nebulization as needed for wheezing or shortness of breath.    [provider]  metoprolol tartrate (LOPRESSOR) 25 MG tablet Take 25 mg by mouth 2 (two) times daily. Patient not taking: Reported on 12/07/2022    [provider]  mupirocin ointment (BACTROBAN) 2 % Apply 1 Application topically 3 (three) times daily.    [provider]  PARoxetine (PAXIL) 20 MG tablet Take 20 mg by mouth daily. 01/23/19   [provider]  pravastatin (PRAVACHOL) 40 MG tablet Take 40 mg by mouth daily. 05/19/22   [provider]    Current Facility-Administered Medications  Medication Dose Route Frequency Provider Last Rate Last Admin   acetaminophen (TYLENOL) tablet 325-650 mg  325-650 mg Oral Q4H PRN Milinda Antis, PA-C   650 mg at 12/30/22 1114   alum & mag hydroxide-simeth (MAALOX/MYLANTA) 200-200-20 MG/5ML suspension 30 mL  30 mL Oral Q4H PRN Setzer, Lynnell Jude, PA-C       amiodarone  (PACERONE) tablet 100 mg  100 mg Oral Daily Marjie Skiff E, PA-C   100 mg at 01/01/23 0759   amLODipine (NORVASC) tablet 5 mg  5 mg Oral Daily Lovorn, Megan, MD   5 mg at 01/01/23 0759   apixaban (ELIQUIS) tablet 2.5 mg  2.5 mg Oral BID Lovorn, Megan, MD   2.5 mg at 01/01/23 0800   arformoterol (BROVANA) nebulizer solution 15 mcg  15 mcg Nebulization BID Milinda Antis, PA-C   15 mcg at 01/01/23 1610   ascorbic acid (VITAMIN C) tablet 250 mg  250 mg Oral Daily Lovorn, Megan, MD   250 mg at 01/01/23 0759   bisacodyl (DULCOLAX) suppository 10 mg  10 mg Rectal Daily Fanny Dance, MD   10 mg at 12/31/22 2018   diphenhydrAMINE (BENADRYL) capsule 25 mg  25 mg Oral Q6H PRN Milinda Antis, PA-C       feeding supplement (ENSURE ENLIVE / ENSURE PLUS) liquid 237 mL  237 mL Oral Q24H Lovorn, Aundra Millet, MD  237 mL at 12/26/22 1431   guaiFENesin-dextromethorphan (ROBITUSSIN DM) 100-10 MG/5ML syrup 10 mL  10 mL Oral Q6H PRN Setzer, Lynnell Jude, PA-C       levalbuterol (XOPENEX) nebulizer solution 0.63 mg  0.63 mg Nebulization Q3H PRN Setzer, Lynnell Jude, PA-C       lidocaine (XYLOCAINE) 2 % jelly 1 Application  1 Application Urethral Q6H PRN Lovorn, Aundra Millet, MD   1 Application at 12/25/22 1215   megestrol (MEGACE) 400 MG/10ML suspension 800 mg  800 mg Oral Daily Lovorn, Megan, MD   800 mg at 01/01/23 0759   methocarbamol (ROBAXIN) tablet 500 mg  500 mg Oral Q6H PRN Milinda Antis, PA-C       multivitamin with minerals tablet 1 tablet  1 tablet Oral Daily Lovorn, Megan, MD   1 tablet at 01/01/23 0759   nitroGLYCERIN (NITROSTAT) SL tablet 0.4 mg  0.4 mg Sublingual Q5 min PRN Raulkar, Drema Pry, MD       Oral care mouth rinse  15 mL Mouth Rinse PRN Lovorn, Megan, MD       oxyCODONE (Oxy IR/ROXICODONE) immediate release tablet 2.5 mg  2.5 mg Oral Q6H PRN Setzer, Lynnell Jude, PA-C       pantoprazole (PROTONIX) EC tablet 40 mg  40 mg Oral Daily Milinda Antis, PA-C   40 mg at 01/01/23 0759   PARoxetine (PAXIL)  tablet 20 mg  20 mg Oral QHS Lovorn, Megan, MD   20 mg at 12/31/22 2027   polyethylene glycol (MIRALAX / GLYCOLAX) packet 17 g  17 g Oral Daily PRN Milinda Antis, PA-C       pravastatin (PRAVACHOL) tablet 40 mg  40 mg Oral Daily Milinda Antis, PA-C   40 mg at 01/01/23 0759   prochlorperazine (COMPAZINE) tablet 5-10 mg  5-10 mg Oral Q6H PRN Milinda Antis, PA-C       Or   prochlorperazine (COMPAZINE) suppository 12.5 mg  12.5 mg Rectal Q6H PRN Milinda Antis, PA-C       Or   prochlorperazine (COMPAZINE) injection 5-10 mg  5-10 mg Intravenous Q6H PRN Setzer, Lynnell Jude, PA-C       revefenacin (YUPELRI) nebulizer solution 175 mcg  175 mcg Nebulization Daily Milinda Antis, PA-C   175 mcg at 01/01/23 8657   senna (SENOKOT) tablet 8.6 mg  1 tablet Oral Daily Lovorn, Megan, MD   8.6 mg at 01/01/23 0759   sodium chloride tablet 1 g  1 g Oral Q0600 Jacquelynn Cree, PA-C   1 g at 01/01/23 0530   sodium phosphate (FLEET) enema 1 enema  1 enema Rectal Once PRN Milinda Antis, PA-C       sulfamethoxazole-trimethoprim (BACTRIM DS) 800-160 MG per tablet 1 tablet  1 tablet Oral Q12H Milinda Antis, PA-C   1 tablet at 01/01/23 0759   tamsulosin (FLOMAX) capsule 0.4 mg  0.4 mg Oral QPC supper Lovorn, Aundra Millet, MD   0.4 mg at 12/31/22 1750    Allergies as of 12/11/2022 - Review Complete 12/07/2022  Allergen Reaction Noted   Ceftriaxone Hives, Shortness Of Breath, Swelling, and Rash 06/27/2019   Albuterol Other (See Comments) 08/04/2022    Review of Systems  Constitutional:  Negative for chills, fever and weight loss.  HENT: Negative.  Negative for hearing loss and tinnitus.   Eyes:  Negative for blurred vision and double vision.  Respiratory:  Negative for cough and hemoptysis.   Cardiovascular:  Negative for chest pain  and palpitations.  Gastrointestinal:  Positive for melena. Negative for heartburn, nausea and vomiting.  Genitourinary:  Negative for dysuria and urgency.  Musculoskeletal:   Negative for myalgias and neck pain.  Skin:  Negative for itching and rash.  Neurological:  Negative for seizures and loss of consciousness.  Psychiatric/Behavioral:  Negative for depression and suicidal ideas.        Physical Exam:  Vital signs in last 24 hours: Temp:  [98.2 F (36.8 C)-98.3 F (36.8 C)] 98.3 F (36.8 C) (11/08 0321) Pulse Rate:  [78-86] 86 (11/08 0321) Resp:  [16] 16 (11/08 0321) BP: (136-163)/(50-58) 163/58 (11/08 0321) SpO2:  [99 %-100 %] 100 % (11/08 0321) Last BM Date : 12/31/22 Last BM recorded by nurses in past 5 days Stool Type: Type 7 (Liquid consistency with no solid pieces) (12/31/2022  8:40 PM)  Physical Exam Constitutional:      Appearance: Normal appearance.  HENT:     Head: Normocephalic and atraumatic.     Mouth/Throat:     Mouth: Mucous membranes are moist.     Pharynx: Oropharynx is clear.  Eyes:     General: No scleral icterus.    Extraocular Movements: Extraocular movements intact.  Cardiovascular:     Rate and Rhythm: Normal rate and regular rhythm.  Pulmonary:     Effort: Pulmonary effort is normal. No respiratory distress.  Abdominal:     General: Abdomen is flat. Bowel sounds are normal. There is no distension.     Palpations: Abdomen is soft. There is no mass.     Tenderness: There is no abdominal tenderness. There is no guarding or rebound.     Hernia: No hernia is present.  Musculoskeletal:        General: No swelling. Normal range of motion.     Cervical back: Normal range of motion and neck supple.  Skin:    General: Skin is warm and dry.  Neurological:     General: No focal deficit present.     Mental Status: She is alert and oriented to person, place, and time.  Psychiatric:        Mood and Affect: Mood normal.        Behavior: Behavior normal.        Thought Content: Thought content normal.        Judgment: Judgment normal.      LAB RESULTS: Recent Labs    12/31/22 1455  WBC 10.5  HGB 8.7*  HCT 27.6*  PLT  244   BMET Recent Labs    12/30/22 1407 12/31/22 1455  NA 136 135  K 4.6 4.5  CL 106 107  CO2 21* 22  GLUCOSE 103* 108*  BUN 42* 51*  CREATININE 1.26* 1.23*  CALCIUM 8.2* 8.0*   LFT Recent Labs    12/31/22 1455  PROT 5.5*  ALBUMIN 2.3*  AST 31  ALT 25  ALKPHOS 63  BILITOT 0.8   PT/INR No results for input(s): "LABPROT", "INR" in the last 72 hours.  STUDIES: No results found.    Impression    GI bleed Anemia CBC 09/01/2022 with hemoglobin 9.7, hemoglobin was normal March 2024 Hgb 8.7 which is a slow drift from 9.9 - 10 days ago Positive fecal occult BUN 51, creatinine 1.23 Patient having melena and very slow drift in hgb (1.0 point over 10 days) also on Eliquis. Suspect as long as she is on Eliquis this will continue to be an issue for her. No previous EGD. Long discussion  with patient and daughter about options (conservative versus EGD). They would like to think about it and let us know later this afternoon. 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   Bayley Leanna Sato  01/01/2023, 1:05 PM    Attending physician's note  I have taken a history, reviewed the chart and examined the patient. I performed a substantive portion of this encounter, including complete performance of at least one of the key components, in conjunction with the APP. I agree with the APP's note, impression and recommendations.    82 year old very pleasant female with paroxysmal A-fib on chronic Eliquis, paraplegia status post TEVAR October 2024 with  decline in hemoglobin and melena Last dose of Eliquis this morning Complains of intermittent dysphagia PPI twice daily  Please hold Eliquis, monitor hemoglobin and transfuse if below 7 If continues to have declining hemoglobin with persistent melena, will plan for EGD for evaluation  The patient was provided an opportunity to ask questions and all were answered. The patient agreed with the plan and demonstrated an understanding of the instructions.  Iona Beard , MD [phone]

## 2023-01-01 NOTE — Progress Notes (Addendum)
PROGRESS NOTE   Subjective/Complaints: Patient resting in bed this morning.  She reports she slept okay last night.  Says she woke up at about 5 AM this morning.  Has been feeling little bit tired later in the morning at around 830 to 9 AM.  Yesterday she was started on Bactrim for UTI.  She was also given some IVF overnight for elevated BUN.    ROS:  Pt denies SOB, abd pain, CP, N/V/C/D, and vision changes, new motor or sensory changes,  + Fatigue-she feels tired this morning   Except for HPI  Objective:   No results found. Recent Labs    12/31/22 1455  WBC 10.5  HGB 8.7*  HCT 27.6*  PLT 244     Recent Labs    12/30/22 1407 12/31/22 1455  NA 136 135  K 4.6 4.5  CL 106 107  CO2 21* 22  GLUCOSE 103* 108*  BUN 42* 51*  CREATININE 1.26* 1.23*  CALCIUM 8.2* 8.0*        Intake/Output Summary (Last 24 hours) at 01/01/2023 1245 Last data filed at 01/01/2023 1035 Gross per 24 hour  Intake 480 ml  Output 2157 ml  Net -1677 ml     Pressure Injury 12/12/22 Sacrum Medial Deep Tissue Pressure Injury - Purple or maroon localized area of discolored intact skin or blood-filled blister due to damage of underlying soft tissue from pressure and/or shear. (Active)  12/12/22 1844  Location: Sacrum  Location Orientation: Medial  Staging: Deep Tissue Pressure Injury - Purple or maroon localized area of discolored intact skin or blood-filled blister due to damage of underlying soft tissue from pressure and/or shear.  Wound Description (Comments):   Present on Admission: Yes     Pressure Injury 12/14/22 Heel Left;Lateral;Posterior Stage 2 -  Partial thickness loss of dermis presenting as a shallow open injury with a red, pink wound bed without slough. (Active)  12/14/22 (first assessed by [facility]) 1221  Location: Heel  Location Orientation: Left;Lateral;Posterior  Staging: Stage 2 -  Partial thickness loss of dermis presenting  as a shallow open injury with a red, pink wound bed without slough.  Wound Description (Comments):   Present on Admission: No     Pressure Injury 12/14/22 Heel Right;Lateral Stage 2 -  Partial thickness loss of dermis presenting as a shallow open injury with a red, pink wound bed without slough. (Active)  12/14/22 (first assessed by [facility]) 1221  Location: Heel  Location Orientation: Right;Lateral  Staging: Stage 2 -  Partial thickness loss of dermis presenting as a shallow open injury with a red, pink wound bed without slough.  Wound Description (Comments):   Present on Admission: No     Pressure Injury 12/14/22 Ischial tuberosity Left;Medial Stage 2 -  Partial thickness loss of dermis presenting as a shallow open injury with a red, pink wound bed without slough. blister (Active)  12/14/22 (first assessed by [facility] nurse) 1221  Location: Ischial tuberosity  Location Orientation: Left;Medial  Staging: Stage 2 -  Partial thickness loss of dermis presenting as a shallow open injury with a red, pink wound bed without slough.  Wound Description (Comments): blister  Present on Admission:  No     Physical Exam: Vital Signs Blood pressure (!) 163/58, pulse 86, temperature 98.3 F (36.8 C), resp. rate 16, height 5' (1.524 m), weight 50.5 kg, SpO2 100%.    General: awake, alert, appropriate, frail, sitting up in bed, NAD HENT: conjugate gaze; oropharynx moist CV: RRR, no MRG Pulmonary: CTAB, nonlabored breathing GI: soft, NT, ND, (+)BS- slightly hypoactive Psychiatric: appropriate-interactive Neurological: Ox3 Skin: Warm and dry Neuro: Sleeping initially but wakes easily to voice, oriented x 4, follows commands, answering questions appropriately Moving bilateral upper extremities to gravity and resistance.   Sensation intact light touch in all 4 extremities.   Cranial nerves II through XII grossly intact.  Prior exam MS; was able to move RLE< but from abd muscles- not in LE muscles- no  movement seen in LLE either-  Skin: Groin incision site looks okay, bruising noted bilateral shins 2 IVs in place left upper extremity looks okay Extremities: unable to palpate L radial pulse but dopplers confirm pulse with rate in 60-70s, arm well perfused appearing, warm and pink, motor function preserved. Brachial pulse also unable to be felt. R radial pulse strong and palpable.    Neuro:   Mental Status: AAOx3, memory intact,  Speech/Languate:  fluent, follows simple commands CRANIAL NERVES: II: PERRL. Visual fields full III, IV, VI: EOM intact, no gaze preference or deviation V: normal sensation bilaterally VII: no asymmetry VIII: normal hearing to speech IX, X: normal palatal elevation XI: 5/5 head turn and 5/5 shoulder shrug bilaterally XII: Tongue midline     MOTOR: RUE: 5/5 Deltoid, 5/5 Biceps, 5/5 Triceps,5/5 Grip LUE: 5/5 Deltoid, 5/5 Biceps, 5/5 Triceps, 5/5 Grip RLE: 0 out of 5 throughout LLE: 0 out of 5 throughout, exam stable 11/6   SENSORY: Normal to touch all 4 extremities to light touch in bilateral upper and lower extremities, patchy altered sensation to cold in lower extremities   No ankle clonus bilaterally   Coordination: Normal finger to nose, no dysmetria  Assessment/Plan: 1. Functional deficits which require 3+ hours per day of interdisciplinary therapy in a comprehensive inpatient rehab setting. Physiatrist is providing close team supervision and 24 hour management of active medical problems listed below. Physiatrist and rehab team continue to assess barriers to discharge/monitor patient progress toward functional and medical goals  Care Tool:  Bathing    Body parts bathed by patient: Right arm, Left arm, Chest, Abdomen, Front perineal area, Face, Right upper leg, Left upper leg   Body parts bathed by helper: Right lower leg, Left lower leg, Buttocks     Bathing assist Assist Level: Minimal Assistance - Patient > 75% (roll in shower chair)      Upper Body Dressing/Undressing Upper body dressing   What is the patient wearing?: Pull over shirt    Upper body assist Assist Level: Supervision/Verbal cueing    Lower Body Dressing/Undressing Lower body dressing      What is the patient wearing?: Incontinence brief, Pants     Lower body assist Assist for lower body dressing: Maximal Assistance - Patient 25 - 49%     Toileting Toileting    Toileting assist Assist for toileting: Total Assistance - Patient < 25%     Transfers Chair/bed transfer  Transfers assist  Chair/bed transfer activity did not occur: Safety/medical concerns  Chair/bed transfer assist level: Minimal Assistance - Patient > 75%     Locomotion Ambulation   Ambulation assist   Ambulation activity did not occur: Safety/medical concerns (paraplegia, fatigue)  Walk 10 feet activity   Assist  Walk 10 feet activity did not occur: Safety/medical concerns (paraplegia, fatigue)        Walk 50 feet activity   Assist Walk 50 feet with 2 turns activity did not occur: Safety/medical concerns (paraplegia, fatigue)         Walk 150 feet activity   Assist Walk 150 feet activity did not occur: Safety/medical concerns (paraplegia, fatigue)         Walk 10 feet on uneven surface  activity   Assist Walk 10 feet on uneven surfaces activity did not occur: Safety/medical concerns (paraplegia, fatigue)         Wheelchair     Assist Is the patient using a wheelchair?: Yes Type of Wheelchair: Power Wheelchair activity did not occur: Safety/medical concerns (paraplegia, fatigue)  Wheelchair assist level: Minimal Assistance - Patient > 75% Max wheelchair distance: 200    Wheelchair 50 feet with 2 turns activity    Assist    Wheelchair 50 feet with 2 turns activity did not occur: Safety/medical concerns (paraplegia, fatigue)   Assist Level: Minimal Assistance - Patient > 75%   Wheelchair 150 feet activity      Assist  Wheelchair 150 feet activity did not occur: Safety/medical concerns (paraplegia, fatigue)   Assist Level: Minimal Assistance - Patient > 75%   Blood pressure (!) 163/58, pulse 86, temperature 98.3 F (36.8 C), resp. rate 16, height 5' (1.524 m), weight 50.5 kg, SpO2 100%.  Medical Problem List and Plan: 1. 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  Antithrombotics: -DVT/anticoagulation:  Pharmaceutical: Eliquis 2.5mg  BID             -antiplatelet therapy: Aspirin 81 mg daily- discontinued per cardiology recommendation   3. Pain Management: continue Tylenol, robaxin, oxycodone as needed 10/24- will wait to try Tramadol for pain/discomfort til have tried  Remeron 7.5mg  for sleep 10/31- stopped Remeron 4. Mood/Behavior/Sleep: LCSW to evaluate and provide emotional support             -continue paroxetine 20 mg daily (hx of depression)             -antipsychotic agents: n/a -12/13/22 hasn't been sleeping well, increase melatonin to 10mg  QHS, added Trazodone 25mg  PRN but advised to be cautious about using it too late.  10/21- will stop Trazodone- can impair ability to pee- will start Restoril 7.5 mg at bedtime for sleep.  10/22- cannot start Remeron due to risk of torsades de pointes- will con't Restoril  10/23- didn't sleep well again- will increase Restoril to 15 mg at bedtime 10/24- spoke to Cards- they are ok trying Remeron but wait on Tramadol-will start 15 mg at bedtime -will stop Restoril>> down to 7.5mg  -12/19/22 slept "wonderfully"! Cont regimen 10/28- Slept poorly- still on rmeron- sounds like every other night she slept well 10/30- sleeping better 10/31- stopped remeron- slept ok yesterday as well  11/5- slept fair, not great 11/6 reports she  slept well last night 11/8 patient reports she slept well overall last night 5. Neuropsych/cognition: This patient is capable of making decisions on her own behalf.   6.  Skin/Wound Care: Routine skin care checks   7. Fluids/Electrolytes/Nutrition: Routine Is and Os and follow-up chemistries   8: Hypertension: monitor TID and prn (hx of a.fib see meds below). Monitor with mobility. Continue metoprolol 25mg  BID  -12/13/22 BPs a little up, monitor for trend 10/21- BP's running high up to 180s- systolic- but had documented BP 87/64 this AM- will wait to titrate BP meds. 10/22- BP still in 150s- but no LOW BP's this AM- will see how does in therapy- 10/23- reduced Metoprolol per pt request to 12.5 mg BID- might need Norvasc for BP control without affecting HR  10/24- will add Norvasc 2.5 mg daily since BP elevated and not coming down  10/25- calling IM- maybe they can help- just added Norvasc yesterday -12/19/22 BPs improving a little this morning, monitor -12/20/22 BPs still a tad elevated but stable, monitor a couple more days before deciding on changes for now 10/28- BP on low side, but Hb low- will transfuse 2 units 10/29- BP looking more labile- but better than it was- monitor for now 11/1: continue current regimen -12/26/22 BPs variable, monitor trend for now 11/5- BP running 130s- 170's- Will increase Norvasc to 5 mg daily  11/7 BP stable, metoprolol discontinued by cardiology 11/8 BP above goal today.  If continues to be elevated cardiology recommends addition of ARB and would not recommend further titration of amlodipine. Will hold off on change for now as would like to avoid hypotension    01/01/2023    3:21 AM 12/31/2022    8:47 PM 12/31/2022    8:08 PM  Vitals with BMI  Systolic 163  145  Diastolic 58  50  Pulse 86 78 79    9: Hyperlipidemia: continue pravastatin 40mg  daily   10: COPD, O2 dependent 2L via [place]:             -continue Brovana nebs BID             -continue  Yupelri neb daily   10/21- Has been on 2L O2 at home per pt and chart  10/25- ABG shows CO2 too low- needs to reduce O2 levels  10/31- Sats ok- con't O2  11: CAD: continue asa and statin   12: Atrial fibrillation, paroxysmal: maintained on Eliquis reversed pre-operatively             -continue amiodarone 100 mg daily             -continue metoprolol tartrate 25 mg BID             -cleared by [facility] to re-start Eliquis>pharmacy consult placed   10/25- reduced Metoprolol to 12.5 mg BID earlier in week - doing better  10/30- BP doing better  11/8 cardiology does not feel she is currently in A-fib, they recommend continue amiodarone 100 mg daily and avoid nodal agents, continue Eliquis  13: 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 10/22- not voiding -U/A (-)  will add Flomax 0.4 mg q supper to see if she can void- I don't think it's likely. D/w nursing due to low amount of caths-  -12/19/22 U/A overnight looks infectious, will start Bactrim DS BID x5d, UCx pending; monitor -12/20/22 UCx with >100k CFU of K. Pneumoniae, sensitive to bactrim, cont abx course 10/29- Being cathed- volumes low- pushing fluids, but will also give 1 L IVFs 10/30- not drinking- Got IVFs but BUN up to 45 and Cr 1.41- will call renal- and recheck in AM-- a little improved on 10/31 labs 11/5- drinking much better- volumes looking better-  and Cr down to 1.16 and BUN 29 from 40's 17: ABLA: stable; follow CBC trend             -consider oral iron supplement -12/13/22 Hgb slowly downtrending from postop 8.8 on 10/14>7.6>7.5>7.3>7.0 yesterday; pt asymptomatic at this time; would recheck tomorrow but if still at the 7.0-range then would consider transfusion to help with therapy tolerance; unclear what her prior baseline was (has hgb 12.2 back in July but then down to 9's).    10/21- Hb 7.4 today  10/23- Hb 7.2 yesterday- will check in AM 10/24- Hb down to 7.0- will d/w pt if she wants to be transfused. If so, will give 1 unit pRBCs.  Doesn't have location of losing blood- will check FOBT x3  10/25- Hb 8.1- likely too dry  10/28- Hb 6.7- will transfuse 2 units 10/29- Hb much better AND pt feels MUCH better per pt- Hb up to 9.9- likely so high since pt really dry- will also rewrite for FOBT- hasn't been done  10/30- no bowel results- so cannot check FOBT yet.  10/31- had 2 extra large Bms last night- didn't check FOBT_ will order again. Will d/w nursing 11/4: Hgb reviewed and is stable 11/5- Hb 9.2 11/8 HGB down to 8.7 yesterday, FOBT postive, has had multiple dark stools recovered, will contact GI, continue PPI -GI recommends to hold Eliquis per note.  Discussed with Dr. Vivianne Master discontinue for now.  Eliquis stopped.  Appreciate GI/cardiology assistance 18: AKI: improving; CMP 12/12/22 showing Cr 1.31; monitor Monday BMPs 10/21- Cr 1.09 down from 1.31 and BUN 25 down from 25- however is drinking poorly and has poor output- 10/23- Says drinking better- will check BMP in AM  10/25- Cr 0.9 and BUN slightly high at 22- but based on Hb increase, likely dry 10/28- Cr 1.4 and BUN elevated- will give blood and recheck BMP in AM 10/29- BUN up to 40 and Cr 1.41- will need to give IVFs- tried seeing if blood would help, but still needs fluids- will her BUN/Cr- will replete 75 cc/hour after therapy today and recheck BMP in AM- of note, her Cr  was running ~ 1.0 and her BUN ~ 25- so big change- will push fluids as well- will give 75cc/hour for 1 liter  10/31- likely form Septra- will stop ABX-  11/4: Cr reviewed and is improving 11/5- Cr 1.16 and BUN 29 down from 40's 11/8 patient was given IV fluids yesterday for elevated BUN, recheck labs today 19: 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  10/22- continue to refuse bowel program-in spite of education of bowel program and need for it.  10/23- pt did last night- had BM with bowel program and one this AM, but educated can take 3-6 weeks to train gut, but should be better- had bowel incontinence for 20 years 10/24- no bowel program documented  but had small BM's at midnight and 5am- said wasn't incontinent?will check on this 10/25- asked pt to get more dig stim this evening since likely cause of bowel incontinent in AM -12/20/22 reports BM this morning but no BM since 12/17/22 documented, no report to nursing staff given; monitor closely but if no BM by tomorrow may need to consider additional adjustments 10/29- LBM yesterday AM, and small with bowel program 10/27-no results last night- if no BM tonight, will give sorbitol/check KUB  10/30- no significant BM for 3 days- will give Sorbitol 30cc at 2pm for bowel program- also add Senna 1 tab daily for meds- 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  -LBM 11/3, continue current regimen Reviewed chart and had BM 11/5 -11/7 LBM today  20. Poor Appetite 10/22- will add Megace for poor appetite since cannot add Remeron and too sedated to add Periactin. Will also order push fluids since not drinking great- although her BUN?Cr looking better- BUN 25 and Cr  1.08-  10/23- pt feels she's drinking 6+ cups/day- ate muffin and some eggs this AM- better than last 2 days-  10/24- 25% of tray at most- will add Remeron 15 mg at bedtime- ok'd by Cards.  10/25- changed remeron to 7.5 mg QHS 10/30- won't eat anything for breakfast- small appetite overall-  10/31- ate 1 pancake this AM- better than normal  21. Prolonged QT interval 10/24- spoke with Cards- they suggested starting Remeron 15 mg at bedtime and then rechecking EKG ina few days to see if can add Tramadol. 10/29- doing ok with remeron- wait to add tramadol for now    22. Oversedation/lethargy/ UTI 10/25- septis and ABG work up- when speaking with PA- appeared to have ST elevation- asked them to call IM consult-  -12/19/22 sleepy but arousable today, U/A+, started bactrim as above, awaiting UCx; CT head neg last night, CBC/BMP stable, TSH/T4 WNL, CXR stable; suspect UTI as source. Monitor. See above #16 11/6: alert and bright this morning  11/7 -Patient alert and oriented x 4, following commands.  Vital signs stable.  Will recheck lab work and UA culture.  Overall she appears stable this morning although fatigue may be waxing and waning.  11/8 patient was restarted on Bactrim last night for suspected UTI, monitor renal function  23. L radial pulse nonpalpable but dopplerable, BPs different in that arm-- unclear when this started, had some pain in L deltoid area yesterday but seemed to be better today. Suspect this pulse/BP discrepancy is 2/2 her vascular issue already, but reached out to vascular surgery to verify. Dr. Karin Lieu returned page, stated this is known since her surgery; nothing to do or be concerned about.   24. Pulsatile abdominal aorta: not noticed previously, not mentioned in notes, but today 12/20/22 I noticed it was very prominently pulsatile and pt reported it being sore in that area; spoke with Dr. Karin Lieu again of [facility], recommended getting CTA Abd/pelv to evaluate for AAA. 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 Bradycardia  -11/7 patient is being followed  by cardiology.  Metoprolol has been stopped and ASA duration was recommended-ASA has been stopped.  -11/8 heart rate doing better today    LOS: 20 days A FACE TO FACE EVALUATION WAS PERFORMED  Fanny Dance 01/01/2023, 12:45 PM

## 2023-01-01 NOTE — Progress Notes (Signed)
Physical Therapy Session Note  Patient Details  Name: Heather Molina MRN: [id number] Date of Birth: [birth year]  Today's Date: 01/01/2023   Short Term Goals: Week 2:  PT Short Term Goal 1 (Week 2): Pt will complete WC mobility in PWC with supervision 150' navigating busy hallways PT Short Term Goal 2 (Week 2): Pt will require mod assist for supine>sit PT Short Term Goal 3 (Week 2): Pt will tolerate sitting balance on firm surface CGA x1 min PT Short Term Goal 4 (Week 2): Pt will complete SB transfer with mod assist  Skilled Therapeutic Interventions/Progress Updates: Pt presented in bed completing breathing treatment. Pt indicating that she was too fatigued to participate. Pt encouraged to try any mobility but pt indicating too fatigued. Pt missed 60 min skilled PT due to refusal. Will continue efforts as schedule permits.      Therapy Documentation Precautions:  Precautions Precautions: Fall Precaution Comments: paraplegia Restrictions Weight Bearing Restrictions: No General: PT Amount of Missed Time (min): 60 Minutes PT Missed Treatment Reason: Patient fatigue Vital Signs: Oxygen Therapy O2 Device: Nasal Cannula O2 Flow Rate (L/min): 2 L/min   Therapy/Group: Individual Therapy  Swathi Dauphin 01/01/2023, 12:50 PM

## 2023-01-01 NOTE — Progress Notes (Signed)
Occupational Therapy Session Note  Patient Details  Name: Heather Molina MRN: [id number] Date of Birth: [birth year]  Today's Date: 01/01/2023 OT Individual Time: 1500-1527 OT Individual Time Calculation (min): 27 min    Short Term Goals: Week 2:  OT Short Term Goal 1 (Week 2): Pt will complete commode transfer Max A with transfer board OT Short Term Goal 1 - Progress (Week 2): Progressing toward goal OT Short Term Goal 2 (Week 2): Pt will complete pressure relief without cues 25% of time throughout sessions OT Short Term Goal 2 - Progress (Week 2): Met Week 3:  OT Short Term Goal 1 (Week 3): Pt will complete commode transfer with Mod A OT Short Term Goal 2 (Week 3): Pt will complete static sitting EOM or EOB with consistent SBA  Skilled Therapeutic Interventions/Progress Updates:  Pt having medical issues with GI and being evaluated by specialists. Bed level and had several sessions with missed visits due to illness and fatigue. Pt agreeable to brief EOB sitting tolerance and balance with + 2 TT and OT. Pt on 2 ltrs O2 via . Moved from supione with bed features and HOB max elevation and OT supporting LE's, pt used bed rail with mod A to come to sit. OT used step under feet and pt maintained unilateral hand rail balance with close S briefly. Beach ball reaching and tossing for 2 min x 3 sets with rest in between and trunk support posteriorly by + 2 TT. Gentle UE and LE stretches provided EOB. Pt laid back down with max A and supported with pillows on Ue's and heel floats Bly via pillow. OT educated on use of reacher as B UE cane ex and then pt completed chest press x 10 and up overhead x 10 with cues for pacing and breathing. Left pt with needs, nurse call button and safety measures in place.    Pain: No pain reported, only fatigue and weakness   Therapy Documentation Precautions:  Precautions Precautions: Fall Precaution Comments: paraplegia Restrictions Weight Bearing Restrictions:  No   Therapy/Group: Individual Therapy  Vicenta Dunning 01/01/2023, 7:47 AM

## 2023-01-02 LAB — BASIC METABOLIC PANEL
Anion gap: 8 (ref 5–15)
BUN: 27 mg/dL — ABNORMAL HIGH (ref 8–23)
CO2: 23 mmol/L (ref 22–32)
Calcium: 8.3 mg/dL — ABNORMAL LOW (ref 8.9–10.3)
Chloride: 106 mmol/L (ref 98–111)
Creatinine, Ser: 0.92 mg/dL (ref 0.44–1.00)
GFR, Estimated: >60 mL/min (ref >60)
Glucose, Bld: 93 mg/dL (ref 70–99)
Potassium: 4.6 mmol/L (ref 3.5–5.1)
Sodium: 137 mmol/L (ref 135–145)

## 2023-01-02 LAB — HEMOGLOBIN AND HEMATOCRIT, BLOOD
HCT: 26 % — ABNORMAL LOW (ref 36.0–46.0)
Hemoglobin: 8.2 g/dL — ABNORMAL LOW (ref 12.0–15.0)

## 2023-01-02 NOTE — Progress Notes (Signed)
PROGRESS NOTE   Subjective/Complaints:  Pt doing well, but still sleeping poorly d/t bed discomfort. Pain well controlled. LBM last night. Still needing caths. No other complaints or concerns today.     ROS:  Pt denies SOB, abd pain, CP, N/V/C/D, and vision changes, new motor or sensory changes,  + Fatigue-she feels tired this morning-ongoing   Except for HPI  Objective:   No results found. Recent Labs    12/31/22 1455 01/01/23 1303 01/02/23 0505  WBC 10.5 6.7  --   HGB 8.7* 8.2* 8.2*  HCT 27.6* 26.1* 26.0*  PLT 244 247  --      Recent Labs    01/01/23 1303 01/02/23 0505  NA 137 137  K 3.9 4.6  CL 107 106  CO2 22 23  GLUCOSE 125* 93  BUN 33* 27*  CREATININE 0.97 0.92  CALCIUM 7.9* 8.3*        Intake/Output Summary (Last 24 hours) at 01/02/2023 1115 Last data filed at 01/02/2023 0618 Gross per 24 hour  Intake 472 ml  Output 1625 ml  Net -1153 ml     Pressure Injury 12/12/22 Sacrum Medial Deep Tissue Pressure Injury - Purple or maroon localized area of discolored intact skin or blood-filled blister due to damage of underlying soft tissue from pressure and/or shear. (Active)  12/12/22 1844  Location: Sacrum  Location Orientation: Medial  Staging: Deep Tissue Pressure Injury - Purple or maroon localized area of discolored intact skin or blood-filled blister due to damage of underlying soft tissue from pressure and/or shear.  Wound Description (Comments):   Present on Admission: Yes     Pressure Injury 12/14/22 Heel Left;Lateral;Posterior Stage 2 -  Partial thickness loss of dermis presenting as a shallow open injury with a red, pink wound bed without slough. (Active)  12/14/22 (first assessed by [facility]) 1221  Location: Heel  Location Orientation: Left;Lateral;Posterior  Staging: Stage 2 -  Partial thickness loss of dermis presenting as a shallow open injury with a red, pink wound bed without slough.   Wound Description (Comments):   Present on Admission: No     Pressure Injury 12/14/22 Heel Right;Lateral Stage 2 -  Partial thickness loss of dermis presenting as a shallow open injury with a red, pink wound bed without slough. (Active)  12/14/22 (first assessed by [facility]) 1221  Location: Heel  Location Orientation: Right;Lateral  Staging: Stage 2 -  Partial thickness loss of dermis presenting as a shallow open injury with a red, pink wound bed without slough.  Wound Description (Comments):   Present on Admission: No     Pressure Injury 12/14/22 Ischial tuberosity Left;Medial Stage 2 -  Partial thickness loss of dermis presenting as a shallow open injury with a red, pink wound bed without slough. blister (Active)  12/14/22 (first assessed by [facility] nurse) 1221  Location: Ischial tuberosity  Location Orientation: Left;Medial  Staging: Stage 2 -  Partial thickness loss of dermis presenting as a shallow open injury with a red, pink wound bed without slough.  Wound Description (Comments): blister  Present on Admission: No     Physical Exam: Vital Signs Blood pressure (!) 146/56, pulse 83, temperature 97.9 F (36.6  C), temperature source Oral, resp. rate 16, height 5' (1.524 m), weight 50.5 kg, SpO2 100%.    General: initially sleeping but then awake, alert, appropriate, frail, laying in bed, NAD HENT: conjugate gaze; oropharynx moist CV: RRR, no MRG Pulmonary: CTAB, nonlabored breathing GI: soft, NT, ND, (+)BS- more normoactive today Psychiatric: appropriate-interactive Skin: Warm and dry Neuro: Sleeping initially but wakes easily to voice, oriented x 4, follows commands, answering questions appropriately Moving bilateral upper extremities to gravity and resistance.   Sensation intact light touch in all 4 extremities.    Prior exams MS; was able to move RLE< but from abd muscles- not in LE muscles- no movement seen in LLE either-  Skin: Groin incision site looks okay, bruising noted  bilateral shins 2 IVs in place left upper extremity looks okay Extremities: unable to palpate L radial pulse but dopplers confirm pulse with rate in 60-70s, arm well perfused appearing, warm and pink, motor function preserved. Brachial pulse also unable to be felt. R radial pulse strong and palpable.    Neuro:   Mental Status: AAOx3, memory intact,  Speech/Languate:  fluent, follows simple commands CRANIAL NERVES: II: PERRL. Visual fields full III, IV, VI: EOM intact, no gaze preference or deviation V: normal sensation bilaterally VII: no asymmetry VIII: normal hearing to speech IX, X: normal palatal elevation XI: 5/5 head turn and 5/5 shoulder shrug bilaterally XII: Tongue midline     MOTOR: RUE: 5/5 Deltoid, 5/5 Biceps, 5/5 Triceps,5/5 Grip LUE: 5/5 Deltoid, 5/5 Biceps, 5/5 Triceps, 5/5 Grip RLE: 0 out of 5 throughout LLE: 0 out of 5 throughout, exam stable 11/6   SENSORY: Normal to touch all 4 extremities to light touch in bilateral upper and lower extremities, patchy altered sensation to cold in lower extremities   No ankle clonus bilaterally   Coordination: Normal finger to nose, no dysmetria  Assessment/Plan: 1. Functional deficits which require 3+ hours per day of interdisciplinary therapy in a comprehensive inpatient rehab setting. Physiatrist is providing close team supervision and 24 hour management of active medical problems listed below. Physiatrist and rehab team continue to assess barriers to discharge/monitor patient progress toward functional and medical goals  Care Tool:  Bathing    Body parts bathed by patient: Right arm, Left arm, Chest, Abdomen, Front perineal area, Face, Right upper leg, Left upper leg   Body parts bathed by helper: Right lower leg, Left lower leg, Buttocks     Bathing assist Assist Level: Minimal Assistance - Patient > 75% (roll in shower chair)     Upper Body Dressing/Undressing Upper body dressing   What is the patient  wearing?: Pull over shirt    Upper body assist Assist Level: Supervision/Verbal cueing    Lower Body Dressing/Undressing Lower body dressing      What is the patient wearing?: Incontinence brief, Pants     Lower body assist Assist for lower body dressing: Maximal Assistance - Patient 25 - 49%     Toileting Toileting    Toileting assist Assist for toileting: Total Assistance - Patient < 25%     Transfers Chair/bed transfer  Transfers assist  Chair/bed transfer activity did not occur: Safety/medical concerns  Chair/bed transfer assist level: Moderate Assistance - Patient 50 - 74%     Locomotion Ambulation   Ambulation assist   Ambulation activity did not occur: Safety/medical concerns (paraplegia, fatigue)          Walk 10 feet activity   Assist  Walk 10 feet activity did not  occur: Safety/medical concerns (paraplegia, fatigue)        Walk 50 feet activity   Assist Walk 50 feet with 2 turns activity did not occur: Safety/medical concerns (paraplegia, fatigue)         Walk 150 feet activity   Assist Walk 150 feet activity did not occur: Safety/medical concerns (paraplegia, fatigue)         Walk 10 feet on uneven surface  activity   Assist Walk 10 feet on uneven surfaces activity did not occur: Safety/medical concerns (paraplegia, fatigue)         Wheelchair     Assist Is the patient using a wheelchair?: Yes Type of Wheelchair: Power Wheelchair activity did not occur: Safety/medical concerns (paraplegia, fatigue)  Wheelchair assist level: Contact Guard/Touching assist Max wheelchair distance: 200    Wheelchair 50 feet with 2 turns activity    Assist    Wheelchair 50 feet with 2 turns activity did not occur: Safety/medical concerns (paraplegia, fatigue)   Assist Level: Contact Guard/Touching assist   Wheelchair 150 feet activity     Assist  Wheelchair 150 feet activity did not occur: Safety/medical concerns  (paraplegia, fatigue)   Assist Level: Contact Guard/Touching assist   Blood pressure (!) 146/56, pulse 83, temperature 97.9 F (36.6 C), temperature source Oral, resp. rate 16, height 5' (1.524 m), weight 50.5 kg, SpO2 100%.  Medical Problem List and Plan: 1. 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  Antithrombotics: -DVT/anticoagulation:  Pharmaceutical: Eliquis 2.5mg  BID -antiplatelet therapy: Aspirin 81 mg daily- discontinued per cardiology recommendation   3. Pain Management: continue Tylenol, robaxin, oxycodone as needed 10/24- will wait to try Tramadol for pain/discomfort til have tried  Remeron 7.5mg  for sleep 10/31- stopped Remeron 4. Mood/Behavior/Sleep: LCSW to evaluate and provide emotional support             -continue paroxetine 20 mg daily (hx of depression)             -antipsychotic agents: n/a -12/13/22 hasn't been sleeping well, increase melatonin to 10mg  QHS, added Trazodone 25mg  PRN but advised to be cautious about using it too late.  10/21- will stop Trazodone- can impair ability to pee- will start Restoril 7.5 mg at bedtime for sleep.  10/22- cannot start Remeron due to risk of torsades de pointes- will con't Restoril  10/23- didn't sleep well again- will increase Restoril to 15 mg at bedtime 10/24- spoke to Cards- they are ok trying Remeron but wait on Tramadol-will start 15 mg at bedtime -will stop Restoril>> down to 7.5mg  -12/19/22 slept "wonderfully"! Cont regimen 10/28- Slept poorly- still on rmeron- sounds like every other night she slept well 10/30- sleeping better 10/31- stopped remeron- slept ok yesterday as well  01/02/23 not sleeping well per patient, but d/t bed; monitor for now 5. Neuropsych/cognition: This patient is capable of  making decisions on her own behalf.   6. Skin/Wound Care: Routine skin care checks   7. Fluids/Electrolytes/Nutrition: Routine Is and Os and follow-up chemistries  -01/02/23 BUN down to 27 this morning, Cr 0.92, monitor routinely   8: Hypertension: monitor TID and prn (hx of a.fib see  meds below). Monitor with mobility. Continue metoprolol 25mg  BID  -12/13/22 BPs a little up, monitor for trend 10/21- BP's running high up to 180s- systolic- but had documented BP 87/64 this AM- will wait to titrate BP meds. 10/22- BP still in 150s- but no LOW BP's this AM- will see how does in therapy- 10/23- reduced Metoprolol per pt request to 12.5 mg BID- might need Norvasc for BP control without affecting HR  10/24- will add Norvasc 2.5 mg daily since BP elevated and not coming down  10/25- calling IM- maybe they can help- just added Norvasc yesterday -12/19/22 BPs improving a little this morning, monitor -12/20/22 BPs still a tad elevated but stable, monitor a couple more days before deciding on changes for now 10/28- BP on low side, but Hb low- will transfuse 2 units 10/29- BP looking more labile- but better than it was- monitor for now 11/1: continue current regimen -12/26/22 BPs variable, monitor trend for now 11/5- BP running 130s- 170's- Will increase Norvasc to 5 mg daily  11/7 BP stable, metoprolol discontinued by cardiology 11/8 BP above goal today.  If continues to be elevated cardiology recommends addition of ARB and would not recommend further titration of amlodipine. Will hold off on change for now as would like to avoid hypotension -01/02/23 BPs a bit better today, cont regimen  Vitals:   12/30/22 0411 12/30/22 1513 12/30/22 1930 12/31/22 0453  BP: (!) 139/50 (!) 127/45 (!) 114/44 (!) 131/46   12/31/22 0928 12/31/22 1040 12/31/22 1307 12/31/22 2008  BP: (!) 128/45 (!) 143/50 (!) 136/50 (!) 145/50   01/01/23 0321 01/01/23 1343 01/01/23 1951 01/02/23 0349  BP: (!) 163/58 (!) 151/48 (!)  133/58 (!) 146/56    9: Hyperlipidemia: continue pravastatin 40mg  daily   10: COPD, O2 dependent 2L via [place]:             -continue Brovana nebs BID             -continue Yupelri neb daily   10/21- Has been on 2L O2 at home per pt and chart  10/25- ABG shows CO2 too low- needs to reduce O2 levels  10/31- Sats ok- con't O2  11: CAD: continue asa and statin   12: Atrial fibrillation, paroxysmal: maintained on Eliquis reversed pre-operatively             -continue amiodarone 100 mg daily             -continue metoprolol tartrate 25 mg BID             -cleared by [facility] to re-start Eliquis>pharmacy consult placed   10/25- reduced Metoprolol to 12.5 mg BID earlier in week - doing better  10/30- BP doing better 11/8 cardiology does not feel she is currently in A-fib, they recommend continue amiodarone 100 mg daily and avoid nodal agents, continue Eliquis  13: 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 10/22- not voiding -U/A (-)  will add Flomax 0.4 mg q supper to see if she can void- I don't think it's likely. D/w nursing due to low amount of caths-  -12/19/22 U/A overnight looks infectious, will start Bactrim DS BID x5d, UCx pending; monitor -12/20/22 UCx with >100k CFU of K. Pneumoniae, sensitive to bactrim, cont abx course 10/29- Being cathed- volumes low- pushing fluids, but will also give 1 L IVFs 10/30-  not drinking- Got IVFs but BUN up to 45 and Cr 1.41- will call renal- and recheck in AM-- a little improved on 10/31 labs 11/5- drinking much better- volumes looking better- and Cr down to 1.16 and BUN 29 from 40's 17: ABLA: stable; follow CBC trend             -consider oral iron supplement -12/13/22 Hgb slowly downtrending from postop 8.8 on 10/14>7.6>7.5>7.3>7.0 yesterday; pt asymptomatic at this time; would recheck tomorrow but if still at the 7.0-range then would consider transfusion to help with therapy tolerance; unclear what her prior baseline was (has hgb 12.2 back in July but then down to 9's).    10/21- Hb 7.4 today  10/23- Hb 7.2 yesterday- will check in AM 10/24- Hb down to 7.0- will d/w pt if she wants to be transfused. If so, will give 1 unit pRBCs.  Doesn't have location of losing blood- will check FOBT x3  10/25- Hb 8.1- likely too dry  10/28- Hb 6.7- will transfuse 2 units 10/29- Hb much better AND pt feels MUCH better per pt- Hb up to 9.9- likely so high since pt really dry- will also rewrite for FOBT- hasn't been done  10/30- no bowel results- so cannot check FOBT yet.  10/31- had 2 extra large Bms last night- didn't check FOBT_ will order again. Will d/w nursing 11/4: Hgb reviewed and is stable 11/5- Hb 9.2 11/8 HGB down to 8.7 yesterday, FOBT postive, has had multiple dark stools recovered, will contact GI, continue PPI -GI recommends to hold Eliquis per note.  Discussed with Dr. Vivianne Master discontinue for now.  Eliquis stopped.  Appreciate GI/cardiology assistance -01/02/23 Hgb stable at 8.2 today, monitor  18: AKI: improving; CMP 12/12/22 showing Cr 1.31; monitor Monday BMPs 10/21- Cr 1.09 down from 1.31 and BUN 25 down from 25- however is drinking poorly and has poor output- 10/23- Says drinking better- will check BMP in AM  10/25- Cr 0.9 and BUN slightly high at 22- but based on Hb increase, likely dry 10/28- Cr 1.4 and BUN elevated- will give blood and recheck BMP in  AM 10/29- BUN up to 40 and Cr 1.41- will need to give IVFs- tried seeing if blood would help, but still needs fluids- will her BUN/Cr- will replete 75 cc/hour after therapy today and recheck BMP in AM- of note, her Cr was running ~ 1.0 and her BUN ~ 25- so big change- will push fluids as well- will give 75cc/hour for 1 liter  10/31- likely form Septra- will stop ABX-  11/4: Cr reviewed and is improving 11/5- Cr 1.16 and BUN 29 down from 40's 11/8 patient was given IV fluids yesterday for elevated BUN, recheck labs today -01/02/23 BUN down to 27, Cr 0.92, monitor  19: 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  10/22- continue to refuse bowel program-in spite of education of bowel program and need for it.  10/23- pt did last night- had BM with bowel program and one this AM, but educated can take 3-6 weeks to train gut, but should be better- had bowel incontinence for 20 years 10/24- no bowel program documented  but had small BM's at midnight and 5am- said wasn't incontinent?will check on this 10/25- asked pt to get more dig stim this evening since likely cause of bowel incontinent in AM -12/20/22 reports BM this morning but no BM since 12/17/22 documented, no report to nursing staff given; monitor closely but if no BM by tomorrow may need to consider additional adjustments 10/29- LBM yesterday AM, and small with bowel program 10/27-no results last night- if no BM tonight, will give sorbitol/check KUB  10/30- no significant BM for 3 days- will give Sorbitol 30cc at 2pm for bowel program- also add Senna 1 tab daily for meds- 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  -LBM 11/3, continue current regimen Reviewed  chart and had BM 11/5 -01/02/23 LBM yesterday  20. Poor Appetite 10/22- will add Megace for poor appetite since cannot add Remeron and too sedated to add Periactin. Will also order push fluids since not drinking great- although her BUN?Cr looking better- BUN 25 and Cr 1.08-  10/23- pt feels she's drinking 6+ cups/day- ate muffin and some eggs this AM- better than last 2 days-  10/24- 25% of tray at most- will add Remeron 15 mg at bedtime- ok'd by Cards.  10/25- changed remeron to 7.5 mg QHS 10/30- won't eat anything for breakfast- small appetite overall-  10/31- ate 1 pancake this AM- better than normal  21. Prolonged QT interval 10/24- spoke with Cards- they suggested starting Remeron 15 mg at bedtime and then rechecking EKG ina few days to see if can add Tramadol. 10/29- doing ok with remeron- wait to add tramadol for now    22. Oversedation/lethargy/ UTI 10/25- septis and ABG work up- when speaking with PA- appeared to have ST elevation- asked them to call IM consult-  -12/19/22 sleepy but arousable today, U/A+, started bactrim as above, awaiting UCx; CT head neg last night, CBC/BMP stable, TSH/T4 WNL, CXR stable; suspect UTI as source. Monitor. See above #16 11/6: alert and bright this morning 11/7 -Patient alert and oriented x 4, following commands.  Vital signs stable.  Will recheck lab work and UA culture.  Overall she appears stable this morning although fatigue may be waxing and waning. 11/8 patient was restarted on Bactrim last night for suspected UTI, monitor renal function  23. L radial pulse nonpalpable but dopplerable, BPs different in that arm-- unclear when this started, had some pain in L deltoid area yesterday but seemed to be better today. Suspect this pulse/BP discrepancy is 2/2 her vascular issue already, but reached out to vascular surgery to verify. Dr. Karin Lieu returned page, stated this is known since her surgery; nothing to do or be concerned about.   24. Pulsatile  abdominal aorta: not noticed previously, not mentioned in notes, but today 12/20/22 I noticed it was very prominently pulsatile and pt reported it being sore in that area; spoke with Dr. Karin Lieu again of [facility], recommended getting CTA Abd/pelv to evaluate for AAA. 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 Bradycardia -11/7 patient is being followed by cardiology.  Metoprolol has been stopped and ASA duration was recommended-ASA has been stopped.  -11/8 heart rate doing better today    LOS: 21 days A FACE TO FACE EVALUATION WAS PERFORMED  [address] 01/02/2023, 11:15 AM

## 2023-01-02 NOTE — Progress Notes (Addendum)
IP Rehab Bowel Program Documentation   Bowel Program Start time 2023  Dig Stim Indicated? Yes  Dig Stim Prior to Suppository or mini Enema X 1   Output from dig stim: None- only clear jelly like consistency liquid returned  Ordered intervention: Suppository Yes , mini enema No ,   Repeat dig stim after Suppository or Mini enema  X 2,  Output? None- Rectal vault clear and only small amount of clear jelly like liquid came out post bowel program   Bowel Program Complete? Yes , handoff given   Patient Tolerated? Yes   During 6am cath patient had another small amount of clear mucus in brief.

## 2023-01-02 NOTE — H&P (View-Only) (Signed)
Progress Note   LOS: 21 days   Chief Complaint: Melena, GI bleed   Subjective   No further bowel movement since yesterday.  Patient getting ready to go for PT.   Objective   Vital signs in last 24 hours: Temp:  [97.9 F (36.6 C)-98.3 F (36.8 C)] 97.9 F (36.6 C) (11/09 0349) Pulse Rate:  [82-84] 83 (11/09 0820) Resp:  [16-18] 16 (11/09 0820) BP: (133-151)/(48-58) 146/56 (11/09 0349) SpO2:  [97 %-100 %] 100 % (11/09 0821) Last BM Date : 01/01/23 Last BM recorded by nurses in past 5 days Stool Type: Type 7 (Liquid consistency with no solid pieces) (01/01/2023  5:00 PM)  General:   female in no acute distress Abdomen: Appears soft and nondistended Extremities:  No edema Neurologic:  Alert and  oriented x4;  No focal deficits.  Psych:  Cooperative. Normal mood and affect.  Intake/Output from previous day: 11/08 0701 - 11/09 0700 In: 712 [P.O.:712] Out: 2225 [Urine:2225] Intake/Output this shift: No intake/output data recorded.  Studies/Results: No results found.  Lab Results: Recent Labs    12/31/22 1455 01/01/23 1303 01/02/23 0505  WBC 10.5 6.7  --   HGB 8.7* 8.2* 8.2*  HCT 27.6* 26.1* 26.0*  PLT 244 247  --    BMET Recent Labs    12/31/22 1455 01/01/23 1303 01/02/23 0505  NA 135 137 137  K 4.5 3.9 4.6  CL 107 107 106  CO2 22 22 23   GLUCOSE 108* 125* 93  BUN 51* 33* 27*  CREATININE 1.23* 0.97 0.92  CALCIUM 8.0* 7.9* 8.3*   LFT Recent Labs    12/31/22 1455  PROT 5.5*  ALBUMIN 2.3*  AST 31  ALT 25  ALKPHOS 63  BILITOT 0.8   PT/INR No results for input(s): "LABPROT", "INR" in the last 72 hours.   Scheduled Meds:  amiodarone  100 mg Oral Daily   amLODipine  5 mg Oral Daily   arformoterol  15 mcg Nebulization BID   ascorbic acid  250 mg Oral Daily   bisacodyl  10 mg Rectal Daily   feeding supplement  237 mL Oral Q24H   megestrol  800 mg Oral Daily   multivitamin with minerals  1 tablet Oral Daily   pantoprazole  40 mg Oral BID AC    PARoxetine  20 mg Oral QHS   pravastatin  40 mg Oral Daily   revefenacin  175 mcg Nebulization Daily   senna  1 tablet Oral Daily   sodium chloride  1 g Oral Q0600   sulfamethoxazole-trimethoprim  1 tablet Oral Q12H   tamsulosin  0.4 mg Oral QPC supper   Continuous Infusions:    Patient profile:   83 year old female history of paroxysmal A-fib (on Eliquis), 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 I performed a substantive portion of this encounter, including complete performance of at  least one of the key components, in conjunction with the APP. I agree with the APP's note, impression and recommendations.    Hemoglobin continuing to drift down, 8.2 this morning, monitor and transfuse if below 7 Hold Eliquis Pantoprazole 40 mg IV twice daily  Plan to proceed with EGD on Monday for further evaluation of melena and anemia The risks and benefits as well as alternatives of endoscopic procedure(s) have been discussed and reviewed. All questions answered. The patient agrees to proceed.     Iona Beard , MD [phone]

## 2023-01-02 NOTE — Progress Notes (Signed)
IP Rehab Bowel Program Documentation   Bowel Program Start time 2001  Dig Stim Indicated? Yes  Dig Stim Prior to Suppository or mini Enema X 1   Output from dig stim: none  Ordered intervention: Suppository Yes , mini enema No ,   Repeat dig stim after Suppository or Mini enema  X 2,  Output? None- only small amount of clear yellowish mucus returned   Bowel Program Complete? Yes , handoff given N/A  Patient Tolerated? Yes     No bowel movement this shift-only small amounts of clear mucus noted.

## 2023-01-02 NOTE — Progress Notes (Signed)
Occupational Therapy Session Note  Patient Details  Name: Heather Molina MRN: [id number] Date of Birth: 05-Apr-1940  Today's Date: 01/02/2023 OT Individual Time: 4696-2952 OT Individual Time Calculation (min): 41 min    Short Term Goals: Week 1:  OT Short Term Goal 1 (Week 1): Pt will complete sitting EOB with Mod A to complete ADL task OT Short Term Goal 1 - Progress (Week 1): Met OT Short Term Goal 2 (Week 1): Pt will complete LB dressing with Max A OT Short Term Goal 2 - Progress (Week 1): Met OT Short Term Goal 3 (Week 1): Pt will complete UB dressing with Mod A OT Short Term Goal 3 - Progress (Week 1): Met Week 2:  OT Short Term Goal 1 (Week 2): Pt will complete commode transfer Max A with transfer board OT Short Term Goal 1 - Progress (Week 2): Progressing toward goal OT Short Term Goal 2 (Week 2): Pt will complete pressure relief without cues 25% of time throughout sessions OT Short Term Goal 2 - Progress (Week 2): Met Week 3:  OT Short Term Goal 1 (Week 3): Pt will complete commode transfer with Mod A OT Short Term Goal 2 (Week 3): Pt will complete static sitting EOM or EOB with consistent SBA  Skilled Therapeutic Interventions/Progress Updates:    Pt sitting up in power chair at time of session, states she is fatigued but willing to participate. Pt wanting to sit up for lunch and lay back down later after last PT session. No pain reported. Educated throughout on pressure relieving techniques and purpose for prevention. Pt O2 sats 100% on room air and no c/o SOB during session. Focus on power chair training in room initially with reaching to grab items at various heights, also taking chair throughout floor taking turns, maneuvering obstacles, backing up, etc. Pt mostly needing assist with backing into small space and cued on techniques. Note chair did give error message and reached out to primary PT. Pt set up in room reclined, O2 on at 2L, call bell in reach.   Therapy  Documentation Precautions:  Precautions Precautions: Fall Precaution Comments: paraplegia Restrictions Weight Bearing Restrictions: No    Therapy/Group: Individual Therapy  Erasmo Score 01/02/2023, 11:53 AM

## 2023-01-02 NOTE — Progress Notes (Addendum)
Progress Note   LOS: 21 days   Chief Complaint: Melena, GI bleed   Subjective   No further bowel movement since yesterday.  Patient getting ready to go for PT.   Objective   Vital signs in last 24 hours: Temp:  [97.9 F (36.6 C)-98.3 F (36.8 C)] 97.9 F (36.6 C) (11/09 0349) Pulse Rate:  [82-84] 83 (11/09 0820) Resp:  [16-18] 16 (11/09 0820) BP: (133-151)/(48-58) 146/56 (11/09 0349) SpO2:  [97 %-100 %] 100 % (11/09 0821) Last BM Date : 01/01/23 Last BM recorded by nurses in past 5 days Stool Type: Type 7 (Liquid consistency with no solid pieces) (01/01/2023  5:00 PM)  General:   female in no acute distress Abdomen: Appears soft and nondistended Extremities:  No edema Neurologic:  Alert and  oriented x4;  No focal deficits.  Psych:  Cooperative. Normal mood and affect.  Intake/Output from previous day: 11/08 0701 - 11/09 0700 In: 712 [P.O.:712] Out: 2225 [Urine:2225] Intake/Output this shift: No intake/output data recorded.  Studies/Results: No results found.  Lab Results: Recent Labs    12/31/22 1455 01/01/23 1303 01/02/23 0505  WBC 10.5 6.7  --   HGB 8.7* 8.2* 8.2*  HCT 27.6* 26.1* 26.0*  PLT 244 247  --    BMET Recent Labs    12/31/22 1455 01/01/23 1303 01/02/23 0505  NA 135 137 137  K 4.5 3.9 4.6  CL 107 107 106  CO2 22 22 23   GLUCOSE 108* 125* 93  BUN 51* 33* 27*  CREATININE 1.23* 0.97 0.92  CALCIUM 8.0* 7.9* 8.3*   LFT Recent Labs    12/31/22 1455  PROT 5.5*  ALBUMIN 2.3*  AST 31  ALT 25  ALKPHOS 63  BILITOT 0.8   PT/INR No results for input(s): "LABPROT", "INR" in the last 72 hours.   Scheduled Meds:  amiodarone  100 mg Oral Daily   amLODipine  5 mg Oral Daily   arformoterol  15 mcg Nebulization BID   ascorbic acid  250 mg Oral Daily   bisacodyl  10 mg Rectal Daily   feeding supplement  237 mL Oral Q24H   megestrol  800 mg Oral Daily   multivitamin with minerals  1 tablet Oral Daily   pantoprazole  40 mg Oral BID AC    PARoxetine  20 mg Oral QHS   pravastatin  40 mg Oral Daily   revefenacin  175 mcg Nebulization Daily   senna  1 tablet Oral Daily   sodium chloride  1 g Oral Q0600   sulfamethoxazole-trimethoprim  1 tablet Oral Q12H   tamsulosin  0.4 mg Oral QPC supper   Continuous Infusions:    Patient profile:   82 year old female history of paroxysmal A-fib (on Eliquis), 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 I performed a substantive portion of this encounter, including complete performance of at  least one of the key components, in conjunction with the APP. I agree with the APP's note, impression and recommendations.    Hemoglobin continuing to drift down, 8.2 this morning, monitor and transfuse if below 7 Hold Eliquis Pantoprazole 40 mg IV twice daily  Plan to proceed with EGD on Monday for further evaluation of melena and anemia The risks and benefits as well as alternatives of endoscopic procedure(s) have been discussed and reviewed. All questions answered. The patient agrees to proceed.     Iona Beard , MD [phone]

## 2023-01-02 NOTE — Progress Notes (Signed)
Physical Therapy Session Note  Patient Details  Name: Heather Molina MRN: [id number] Date of Birth: 09-20-1940  Today's Date: 01/02/2023 PT Individual Time: 0916-1000 and 1300-1345 PT Individual Time Calculation (min): 44 min and 45 min  Short Term Goals: Week 3:     Skilled Therapeutic Interventions/Progress Updates:   First session:  Pt presents supine in bed and agreeable to therapy, although still fatigued.  PT donned TEDS w/ total A, threaded pants over feet and pt pulled to bottom.  Pt rolled side to side w/ CGA for total A completing pants over hips.  Total A for leg lifters.  Pt required only min  to move LES to EOB and then mod A for sidelying to sit at EOB.  Pt sat EOB w/ UE support but only CGA for balance.  Pt required Total A for placement of SB and 4" platform, although leans to R w/ CGA and returns to sitting w/ min A.  Mod A for SB transfer into PWC w/ slight uphill bias.  Pt negotiated PWC into hallway , through dayroom and returned to room w/ 2 episodes of PT assist to back into position near bed.  Pt reminded of reclining seat for pressure relief and remained sitting w/ seat belt on and all needs in reach.  Second session:  Pt presents reclined back in PWC and agreeable to therapy, continued fatigue.  Pt wheeled to dayroom w/ supervision and occasional verbal cues, low speed.  Pt performs SB transfer to minimally uphill bias mat table w/ min A initially, but then mod A to complete safe transfer.  Pt able to maintain balance w/o UE w/ short periods of supervision, but w/ LOB requires min A.  Pt performs seated reaching down shins and return to upright position for increased core strength.  Pt performed unilateral reaching forward and then crossing midline w/ LOB/lean to Right requiring A to regain.  Pt increasingly fatigued.  Pt performed SB transfer mat table to PWC w/ slight uphill bias and min increasing to mod A.  Pt wheeled to room and positioned next to bed w/o PT assist.  Pt  leans for Total A to place SB.  4" platform used for all SB transfers.  Pt required mod A for sit to supine and then rolls to supine.  Pt able to assist to [facility] w/ B UES.  Leg loops, pants and TEDS doffed w/ Total A.  Pt remained supine w/ bed rails up and all needs in reach.     Therapy Documentation Precautions:  Precautions Precautions: Fall Precaution Comments: paraplegia Restrictions Weight Bearing Restrictions: No General:   Vital Signs: Therapy Vitals Pulse Rate: 83 Resp: 16 Patient Position (if appropriate): Lying Oxygen Therapy SpO2: 100 % O2 Device: Nasal Cannula O2 Flow Rate (L/min): 2 L/min Pain:0/10 B sessions.       Therapy/Group: Individual Therapy  Lucio Edward 01/02/2023, 10:02 AM

## 2023-01-03 LAB — HEMOGLOBIN AND HEMATOCRIT, BLOOD
HCT: 24.9 % — ABNORMAL LOW (ref 36.0–46.0)
Hemoglobin: 8 g/dL — ABNORMAL LOW (ref 12.0–15.0)

## 2023-01-03 NOTE — Progress Notes (Signed)
Occupational Therapy Session Note  Patient Details  Name: Heather Molina MRN: [id number] Date of Birth: 12/09/1940  Today's Date: 01/03/2023 OT Individual Time: 0915-1000 OT Individual Time Calculation (min): 45 min    Short Term Goals: Week 3:  OT Short Term Goal 1 (Week 3): Pt will complete commode transfer with Mod A OT Short Term Goal 2 (Week 3): Pt will complete static sitting EOM or EOB with consistent SBA  Skilled Therapeutic Interventions/Progress Updates:      Therapy Documentation Precautions:  Precautions Precautions: Fall Precaution Comments: paraplegia Restrictions Weight Bearing Restrictions: No General: "Hi honey!" Pt seated in W/C upon OT arrival, agreeable to OT.  Pain: no pain reported  Other Treatments: Pt demonstrated PWC mobility in order to prepare for community integration at [facility]. Pt propelled in W/C maneuvering through cones at various lengths apart in order to practice driving PWC through tight spaces. Pt with VC required for direction in order to avoid hitting cones Pt completed trial twice with 70 % accuracy.  Pt seated in W/C at end of session with W/C alarm donned, call light within reach and 4Ps assessed.    Therapy/Group: Individual Therapy  [name], OTD, OTR/L 01/03/2023, 4:02 PM

## 2023-01-03 NOTE — Progress Notes (Signed)
Physical Therapy Weekly Progress Note  Patient Details  Name: Heather Molina MRN: [id number] Date of Birth: 02/08/1941  Beginning of progress report period: December 23, 2022 End of progress report period: January 03, 2023   Patient has met 2 of 3 short term goals.  Patient is making excellent progress towards long term goals and largely requires min/mod A with supine<>sit, supervision for rolling, min/mod A for level and unlevel slide board transfer with total A for board placement. Pt min A to supervision for PWC mobility with obstacle navigation. Plan for family meeting in upcoming week.   Patient continues to demonstrate the following deficits muscle weakness, muscle joint tightness, and muscle paralysis, impaired timing and sequencing, unbalanced muscle activation, decreased coordination, and decreased motor planning, and decreased sitting balance, decreased postural control, and decreased balance strategies and therefore will continue to benefit from skilled PT intervention to increase functional independence with mobility.  Patient progressing toward long term goals..  Continue plan of care.  PT Short Term Goals Week 1:  PT Short Term Goal 1 (Week 1): Pt will require mod A with rolling PT Short Term Goal 1 - Progress (Week 1): Progressing toward goal PT Short Term Goal 2 (Week 1): Pt will require mod A with supine <>sit PT Short Term Goal 2 - Progress (Week 1): Progressing toward goal PT Short Term Goal 3 (Week 1): Pt will require mod A for static sitting balance x 1 minute PT Short Term Goal 3 - Progress (Week 1): Met PT Short Term Goal 4 (Week 1): Pt will require mod A for slide board transfer from bed<>chair PT Short Term Goal 4 - Progress (Week 1): Progressing toward goal Week 2:  PT Short Term Goal 1 (Week 2): Pt will complete WC mobility in PWC with supervision 150' navigating busy hallways PT Short Term Goal 1 - Progress (Week 2): Progressing toward goal PT Short Term Goal 2  (Week 2): Pt will require mod assist for supine>sit PT Short Term Goal 2 - Progress (Week 2): Met PT Short Term Goal 3 (Week 2): Pt will tolerate sitting balance on firm surface CGA x1 min PT Short Term Goal 3 - Progress (Week 2): Met PT Short Term Goal 4 (Week 2): Pt will complete SB transfer with mod assist PT Short Term Goal 4 - Progress (Week 2): Met Week 3:  PT Short Term Goal 1 (Week 3): STG's = LTG's due to ELOS  Skilled Therapeutic Interventions/Progress Updates:      Therapy Documentation Precautions:  Precautions Precautions: Fall Precaution Comments: paraplegia Restrictions Weight Bearing Restrictions: No    Therapy/Group: Individual Therapy  Truitt Leep Truitt Leep PT, DPT  01/03/2023, 8:43 AM

## 2023-01-03 NOTE — Progress Notes (Signed)
PROGRESS NOTE   Subjective/Complaints:  Heather Molina doing well again today, still sleeping poorly d/t bed discomfort but slept "as good as possible". Pain well controlled. LBM 01/01/23, other than some clear mucus yesterday. Still needing caths which is going fine. No other complaints or concerns today. Plan for EGD tomorrow.     ROS:  Heather Molina denies SOB, abd pain, CP, N/V/C/D, and vision changes, new motor or sensory changes,  + Fatigue-she feels tired this morning-ongoing   Except for HPI  Objective:   No results found. Recent Labs    12/31/22 1455 01/01/23 1303 01/02/23 0505 01/03/23 0453  WBC 10.5 6.7  --   --   HGB 8.7* 8.2* 8.2* 8.0*  HCT 27.6* 26.1* 26.0* 24.9*  PLT 244 247  --   --      Recent Labs    01/01/23 1303 01/02/23 0505  NA 137 137  K 3.9 4.6  CL 107 106  CO2 22 23  GLUCOSE 125* 93  BUN 33* 27*  CREATININE 0.97 0.92  CALCIUM 7.9* 8.3*        Intake/Output Summary (Last 24 hours) at 01/03/2023 0814 Last data filed at 01/03/2023 0736 Gross per 24 hour  Intake 468 ml  Output 2000 ml  Net -1532 ml     Pressure Injury 12/12/22 Sacrum Medial Deep Tissue Pressure Injury - Purple or maroon localized area of discolored intact skin or blood-filled blister due to damage of underlying soft tissue from pressure and/or shear. (Active)  12/12/22 1844  Location: Sacrum  Location Orientation: Medial  Staging: Deep Tissue Pressure Injury - Purple or maroon localized area of discolored intact skin or blood-filled blister due to damage of underlying soft tissue from pressure and/or shear.  Wound Description (Comments):   Present on Admission: Yes     Pressure Injury 12/14/22 Heel Left;Lateral;Posterior Stage 2 -  Partial thickness loss of dermis presenting as a shallow open injury with a red, pink wound bed without slough. (Active)  12/14/22 (first assessed by [facility]) 1221  Location: Heel  Location Orientation:  Left;Lateral;Posterior  Staging: Stage 2 -  Partial thickness loss of dermis presenting as a shallow open injury with a red, pink wound bed without slough.  Wound Description (Comments):   Present on Admission: No     Pressure Injury 12/14/22 Heel Right;Lateral Stage 2 -  Partial thickness loss of dermis presenting as a shallow open injury with a red, pink wound bed without slough. (Active)  12/14/22 (first assessed by [facility]) 1221  Location: Heel  Location Orientation: Right;Lateral  Staging: Stage 2 -  Partial thickness loss of dermis presenting as a shallow open injury with a red, pink wound bed without slough.  Wound Description (Comments):   Present on Admission: No     Pressure Injury 12/14/22 Ischial tuberosity Left;Medial Stage 2 -  Partial thickness loss of dermis presenting as a shallow open injury with a red, pink wound bed without slough. blister (Active)  12/14/22 (first assessed by [facility] nurse) 1221  Location: Ischial tuberosity  Location Orientation: Left;Medial  Staging: Stage 2 -  Partial thickness loss of dermis presenting as a shallow open injury with a red, pink wound bed  without slough.  Wound Description (Comments): blister  Present on Admission: No     Physical Exam: Vital Signs Blood pressure (!) 145/55, pulse 87, temperature 98.5 F (36.9 C), temperature source Oral, resp. rate 18, height 5' (1.524 m), weight 50.5 kg, SpO2 99%.    General: awake, alert, appropriate, frail, reclined in bed eating, NAD HENT: conjugate gaze; oropharynx moist CV: RRR, no MRG Pulmonary: CTAB, nonlabored breathing GI: soft, NT, ND, (+)BS- more normoactive today Psychiatric: appropriate-interactive Skin: Warm and dry Neuro: awake today, oriented x 4, follows commands, answering questions appropriately Moving bilateral upper extremities to gravity and resistance.   Sensation intact light touch in all 4 extremities.    Prior exams MS; was able to move RLE< but from abd muscles-  not in LE muscles- no movement seen in LLE either-  Skin: Groin incision site looks okay, bruising noted bilateral shins 2 IVs in place left upper extremity looks okay Extremities: unable to palpate L radial pulse but dopplers confirm pulse with rate in 60-70s, arm well perfused appearing, warm and pink, motor function preserved. Brachial pulse also unable to be felt. R radial pulse strong and palpable.    Neuro:   Mental Status: AAOx3, memory intact,  Speech/Languate:  fluent, follows simple commands CRANIAL NERVES: II: PERRL. Visual fields full III, IV, VI: EOM intact, no gaze preference or deviation V: normal sensation bilaterally VII: no asymmetry VIII: normal hearing to speech IX, X: normal palatal elevation XI: 5/5 head turn and 5/5 shoulder shrug bilaterally XII: Tongue midline     MOTOR: RUE: 5/5 Deltoid, 5/5 Biceps, 5/5 Triceps,5/5 Grip LUE: 5/5 Deltoid, 5/5 Biceps, 5/5 Triceps, 5/5 Grip RLE: 0 out of 5 throughout LLE: 0 out of 5 throughout, exam stable 11/6   SENSORY: Normal to touch all 4 extremities to light touch in bilateral upper and lower extremities, patchy altered sensation to cold in lower extremities   No ankle clonus bilaterally   Coordination: Normal finger to nose, no dysmetria  Assessment/Plan: 1. Functional deficits which require 3+ hours per day of interdisciplinary therapy in a comprehensive inpatient rehab setting. Physiatrist is providing close team supervision and 24 hour management of active medical problems listed below. Physiatrist and rehab team continue to assess barriers to discharge/monitor patient progress toward functional and medical goals  Care Tool:  Bathing    Body parts bathed by patient: Right arm, Left arm, Chest, Abdomen, Front perineal area, Face, Right upper leg, Left upper leg   Body parts bathed by helper: Right lower leg, Left lower leg, Buttocks     Bathing assist Assist Level: Minimal Assistance - Patient > 75%  (roll in shower chair)     Upper Body Dressing/Undressing Upper body dressing   What is the patient wearing?: Pull over shirt    Upper body assist Assist Level: Supervision/Verbal cueing    Lower Body Dressing/Undressing Lower body dressing      What is the patient wearing?: Incontinence brief, Pants     Lower body assist Assist for lower body dressing: Maximal Assistance - Patient 25 - 49%     Toileting Toileting    Toileting assist Assist for toileting: Total Assistance - Patient < 25%     Transfers Chair/bed transfer  Transfers assist  Chair/bed transfer activity did not occur: Safety/medical concerns  Chair/bed transfer assist level: Moderate Assistance - Patient 50 - 74%     Locomotion Ambulation   Ambulation assist   Ambulation activity did not occur: Safety/medical concerns (paraplegia, fatigue)  Walk 10 feet activity   Assist  Walk 10 feet activity did not occur: Safety/medical concerns (paraplegia, fatigue)        Walk 50 feet activity   Assist Walk 50 feet with 2 turns activity did not occur: Safety/medical concerns (paraplegia, fatigue)         Walk 150 feet activity   Assist Walk 150 feet activity did not occur: Safety/medical concerns (paraplegia, fatigue)         Walk 10 feet on uneven surface  activity   Assist Walk 10 feet on uneven surfaces activity did not occur: Safety/medical concerns (paraplegia, fatigue)         Wheelchair     Assist Is the patient using a wheelchair?: Yes Type of Wheelchair: Power Wheelchair activity did not occur: Safety/medical concerns (paraplegia, fatigue)  Wheelchair assist level: Contact Guard/Touching assist Max wheelchair distance: 200    Wheelchair 50 feet with 2 turns activity    Assist    Wheelchair 50 feet with 2 turns activity did not occur: Safety/medical concerns (paraplegia, fatigue)   Assist Level: Contact Guard/Touching assist   Wheelchair 150  feet activity     Assist  Wheelchair 150 feet activity did not occur: Safety/medical concerns (paraplegia, fatigue)   Assist Level: Contact Guard/Touching assist   Blood pressure (!) 145/55, pulse 87, temperature 98.5 F (36.9 C), temperature source Oral, resp. rate 18, height 5' (1.524 m), weight 50.5 kg, SpO2 99%.  Medical Problem List and Plan: 1. 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  Antithrombotics: -DVT/anticoagulation:  Pharmaceutical: Eliquis 2.5mg  BID-- on hold for EGD 01/04/23 -antiplatelet therapy: Aspirin 81 mg daily- discontinued per cardiology recommendation   3. Pain Management: continue Tylenol, robaxin, oxycodone as needed 10/24- will wait to try Tramadol for pain/discomfort til have tried  Remeron 7.5mg  for sleep 10/31- stopped Remeron 4. Mood/Behavior/Sleep: LCSW to evaluate and provide emotional support             -continue paroxetine 20 mg daily (hx of depression)             -antipsychotic agents: n/a -12/13/22 hasn't been sleeping well, increase melatonin to 10mg  QHS, added Trazodone 25mg  PRN but advised to be cautious about using it too late.  10/21- will stop Trazodone- can impair ability to pee- will start Restoril 7.5 mg at bedtime for sleep.  10/22- cannot start Remeron due to risk of torsades de pointes- will con't Restoril  10/23- didn't sleep well again- will increase Restoril to 15 mg at bedtime 10/24- spoke to Cards- they are ok trying Remeron but wait on Tramadol-will start 15 mg at bedtime -will stop Restoril>> down to 7.5mg  -12/19/22 slept "wonderfully"! Cont regimen 10/28- Slept poorly- still on rmeron- sounds like every other night she slept well 10/30- sleeping better 10/31- stopped remeron- slept ok yesterday as well   01/02/23 not sleeping well per patient, but d/t bed; monitor for now 5. Neuropsych/cognition: This patient is capable of making decisions on her own behalf.   6. Skin/Wound Care: Routine skin care checks   7. Fluids/Electrolytes/Nutrition: Routine Is and Os and follow-up chemistries  -01/02/23 BUN down to  27 this morning, Cr 0.92, monitor routinely   8: Hypertension: monitor TID and prn (hx of a.fib see meds below). Monitor with mobility. Continue metoprolol 25mg  BID  -12/13/22 BPs a little up, monitor for trend 10/21- BP's running high up to 180s- systolic- but had documented BP 87/64 this AM- will wait to titrate BP meds. 10/22- BP still in 150s- but no LOW BP's this AM- will see how does in therapy- 10/23- reduced Metoprolol per Heather Molina request to 12.5 mg BID- might need Norvasc for BP control without affecting HR  10/24- will add Norvasc 2.5 mg daily since BP elevated and not coming down  10/25- calling IM- maybe they can help- just added Norvasc yesterday -12/19/22 BPs improving a little this morning, monitor -12/20/22 BPs still a tad elevated but stable, monitor a couple more days before deciding on changes for now 10/28- BP on low side, but Hb low- will transfuse 2 units 10/29- BP looking more labile- but better than it was- monitor for now 11/1: continue current regimen -12/26/22 BPs variable, monitor trend for now 11/5- BP running 130s- 170's- Will increase Norvasc to 5 mg daily  11/7 BP stable, metoprolol discontinued by cardiology 11/8 BP above goal today.  If continues to be elevated cardiology recommends addition of ARB and would not recommend further titration of amlodipine. Will hold off on change for now as would like to avoid hypotension -11/9-10/24 BPs a bit better, cont regimen  Vitals:   12/31/22 0453 12/31/22 0928 12/31/22 1040 12/31/22 1307  BP: (!) 131/46 (!) 128/45 (!) 143/50 (!) 136/50   12/31/22 2008 01/01/23 0321 01/01/23 1343 01/01/23 1951  BP: (!) 145/50 (!)  163/58 (!) 151/48 (!) 133/58   01/02/23 0349 01/02/23 1345 01/02/23 1829 01/03/23 0334  BP: (!) 146/56 (!) 142/51 (!) 138/53 (!) 145/55    9: Hyperlipidemia: continue pravastatin 40mg  daily   10: COPD, O2 dependent 2L via [place]:             -continue Brovana nebs BID             -continue Yupelri neb daily   10/21- Has been on 2L O2 at home per Heather Molina and chart  10/25- ABG shows CO2 too low- needs to reduce O2 levels  10/31- Sats ok- con't O2  11: CAD: continue asa and statin   12: Atrial fibrillation, paroxysmal: maintained on Eliquis reversed pre-operatively             -continue amiodarone 100 mg daily             -continue metoprolol tartrate 25 mg BID             -cleared by [facility] to re-start Eliquis>pharmacy consult placed   10/25- reduced Metoprolol to 12.5 mg BID earlier in week - doing better  10/30- BP doing better 11/8 cardiology does not feel she is currently in A-fib, they recommend continue amiodarone 100 mg daily and avoid nodal agents, continue Eliquis  13: 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 10/22- not voiding -U/A (-)  will add Flomax 0.4 mg q supper to see if she can void- I don't think it's likely. D/w nursing due to low amount of caths-  -12/19/22 U/A overnight looks infectious, will start Bactrim DS BID x5d, UCx pending; monitor -12/20/22 UCx with >100k CFU of K. Pneumoniae,  sensitive to bactrim, cont abx course 10/29- Being cathed- volumes low- pushing fluids, but will also give 1 L IVFs 10/30- not drinking- Got IVFs but BUN up to 45 and Cr 1.41- will call renal- and recheck in AM-- a little improved on 10/31 labs 11/5- drinking much better- volumes looking better- and Cr down to 1.16 and BUN 29 from 40's 17: ABLA: stable; follow CBC trend             -consider oral iron supplement -12/13/22 Hgb slowly downtrending from postop 8.8 on 10/14>7.6>7.5>7.3>7.0 yesterday; Heather Molina asymptomatic at this time; would recheck tomorrow but if still at the 7.0-range then would consider transfusion to help with therapy tolerance; unclear what her prior baseline was (has hgb 12.2 back in July but then down to 9's).    10/21- Hb 7.4 today  10/23- Hb 7.2 yesterday- will check in AM 10/24- Hb down to 7.0- will d/w Heather Molina if she wants to be transfused. If so, will give 1 unit pRBCs.  Doesn't have location of losing blood- will check FOBT x3  10/25- Hb 8.1- likely too dry  10/28- Hb 6.7- will transfuse 2 units 10/29- Hb much better AND Heather Molina feels MUCH better per Heather Molina- Hb up to 9.9- likely so high since Heather Molina really dry- will also rewrite for FOBT- hasn't been done  10/30- no bowel results- so cannot check FOBT yet.  10/31- had 2 extra large Bms last night- didn't check FOBT_ will order again. Will d/w nursing 11/4: Hgb reviewed and is stable 11/5- Hb 9.2 11/8 HGB down to 8.7 yesterday, FOBT postive, has had multiple dark stools recovered, will contact GI, continue PPI -GI recommends to hold Eliquis per note.  Discussed with Dr. Vivianne Master discontinue for now.  Eliquis stopped.  Appreciate GI/cardiology assistance -01/02/23 Hgb stable at 8.2 today, monitor -01/03/23 Hgb down to 8.0; transfuse to keep hgb >7 per GI notes; plan for EGD tomorrow; per notes, PPI IV but PO currently, asked GI and they said PO is fine. Will leave order in place.   18: AKI: improving; CMP 12/12/22 showing Cr 1.31; monitor  Monday BMPs 10/21- Cr 1.09 down from 1.31 and BUN 25 down from 25- however is drinking poorly and has poor output- 10/23- Says drinking better- will check BMP in AM  10/25- Cr 0.9 and BUN slightly high at 22- but based on Hb increase, likely dry 10/28- Cr 1.4 and BUN elevated- will give blood and recheck BMP in AM 10/29- BUN up to 40 and Cr 1.41- will need to give IVFs- tried seeing if blood would help, but still needs fluids- will her BUN/Cr- will replete 75 cc/hour after therapy today and recheck BMP in AM- of note, her Cr was running ~ 1.0 and her BUN ~ 25- so big change- will push fluids as well- will give 75cc/hour for 1 liter  10/31- likely form Septra- will stop ABX-  11/4: Cr reviewed and is improving 11/5- Cr 1.16 and BUN 29  down from 40's 11/8 patient was given IV fluids yesterday for elevated BUN, recheck labs today -01/02/23 BUN down to 27, Cr 0.92, monitor  19: 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  10/22- continue to refuse bowel program-in spite of education of bowel program and need for it.  10/23- Heather Molina did last night- had BM with bowel program and one this AM, but educated can take 3-6 weeks to train gut, but should be better- had bowel incontinence for 20 years 10/24- no bowel program documented  but had small BM's at midnight and 5am- said wasn't incontinent?will check on this 10/25- asked Heather Molina to get more dig stim this evening since likely cause of bowel incontinent in AM -12/20/22 reports BM this morning but no BM since 12/17/22 documented, no report to nursing staff given; monitor closely but if no BM by tomorrow may need to consider additional adjustments 10/29- LBM yesterday AM, and small with  bowel program 10/27-no results last night- if no BM tonight, will give sorbitol/check KUB  10/30- no significant BM for 3 days- will give Sorbitol 30cc at 2pm for bowel program- also add Senna 1 tab daily for meds- 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  -LBM 11/3, continue current regimen Reviewed chart and had BM 11/5 -01/03/23 no further BMs since 11/8, just mucus; see GI plan above  20. Poor Appetite 10/22- will add Megace for poor appetite since cannot add Remeron and too sedated to add Periactin. Will also order push fluids since not drinking great- although her BUN?Cr looking better- BUN 25 and Cr 1.08-  10/23- Heather Molina feels she's drinking 6+ cups/day- ate muffin and some eggs this AM- better than last 2 days-  10/24- 25% of tray at most- will add Remeron 15 mg at bedtime- ok'd by Cards.  10/25- changed remeron to 7.5 mg QHS 10/30- won't eat anything for breakfast- small appetite overall-  10/31- ate 1 pancake this AM- better than normal  21. Prolonged QT interval 10/24- spoke with Cards- they suggested starting Remeron 15 mg at bedtime and then rechecking EKG ina few days to see if can add Tramadol. 10/29- doing ok with remeron- wait to add tramadol for now    22. Oversedation/lethargy/ UTI 10/25- septis and ABG work up- when speaking with PA- appeared to have ST elevation- asked them to call IM consult-  -12/19/22 sleepy but arousable today, U/A+, started bactrim as above, awaiting UCx; CT head neg last night, CBC/BMP stable, TSH/T4 WNL, CXR stable; suspect UTI as source. Monitor. See above #16 11/6: alert and bright this morning 11/7 -Patient alert and oriented x 4, following commands.  Vital signs stable.  Will recheck lab work and UA culture.  Overall she appears stable this morning although fatigue may be waxing and waning. 11/8 patient was restarted on Bactrim last night for suspected UTI, monitor renal function  23. L radial pulse nonpalpable but dopplerable, BPs  different in that arm-- unclear when this started, had some pain in L deltoid area yesterday but seemed to be better today. Suspect this pulse/BP discrepancy is 2/2 her vascular issue already, but reached out to vascular surgery to verify. Dr. Karin Lieu returned page, stated this is known since her surgery; nothing to  do or be concerned about.   24. Pulsatile abdominal aorta: not noticed previously, not mentioned in notes, but today 12/20/22 I noticed it was very prominently pulsatile and Heather Molina reported it being sore in that area; spoke with Dr. Karin Lieu again of [facility], recommended getting CTA Abd/pelv to evaluate for AAA. 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 Bradycardia -11/7 patient is being followed by cardiology.  Metoprolol has been stopped and ASA duration was recommended-ASA has been stopped.  -11/8 heart rate doing better today    LOS: 22 days A FACE TO FACE EVALUATION WAS PERFORMED  [address] 01/03/2023, 8:14 AM

## 2023-01-03 NOTE — Progress Notes (Signed)
IP Rehab Bowel Program Documentation   Bowel Program Start time [phone]  Dig Stim Indicated? Yes  Dig Stim Prior to Suppository or mini Enema X 1   Output from dig stim: none  Ordered intervention: Suppository Yes , mini enema No ,   Repeat dig stim after Suppository or Mini enema  X 1,  Output? Small   Bowel Program Complete? Yes ,  handoff given no  Patient Tolerated? Yes

## 2023-01-03 NOTE — Progress Notes (Signed)
Physical Therapy Session Note  Patient Details  Name: Heather Molina MRN: [id number] Date of Birth: March 03, 1940  Today's Date: 01/03/2023 PT Individual Time: 4098-1191, 1345- 1439  PT Individual Time Calculation (min): 43 min , 54 min   Short Term Goals: Week 2:  PT Short Term Goal 1 (Week 2): Pt will complete WC mobility in PWC with supervision 150' navigating busy hallways PT Short Term Goal 2 (Week 2): Pt will require mod assist for supine>sit PT Short Term Goal 3 (Week 2): Pt will tolerate sitting balance on firm surface CGA x1 min PT Short Term Goal 4 (Week 2): Pt will complete SB transfer with mod assist  Skilled Therapeutic Interventions/Progress Updates:      Therapy Documentation Precautions:  Precautions Precautions: Fall Precaution Comments: paraplegia Restrictions Weight Bearing Restrictions: No  Treatment Session 1:   Pt agreeable to PT and without reports of pain in session.   Pt with moderate mucus rectal output, nurse notified and pt dependent for peri-care.   Pt dependent for lower body dressing (pants, ted hose, shoes, leg loops) for energy conservation.   Pt min A with supine to sit with use of bed features, plan for discharge with hospital bed.   Total A for slideboard placement and CGA with lateral transfer to PWC with increased time and min cues for head/hips relationship.   Pt left seated in PWC at bedside and reports fatigue following session. Pt left with all needs in reach.    Treatment Session 2:   Pt received semi-reclined in bed with daughter present and both parties agreeable to initiate family training. PT dependently donned leg loops and shoes and pt supervision with rolling in bed. Pt min A with supine>sit with HOB fully elevated and for slide board transfer to [facility]. PT educated on how to appropriately guard and provide required min assist. Pt daughter performed return slide board transfer and sit to lying with PT supervision and cues. PT  scheduled family training to practice car transfer for Tuesday (1-3). Pt left semi-reclined in bed with PRAFO's donned and all needs in reach.   Therapy/Group: Individual Therapy  Truitt Leep Truitt Leep PT, DPT  01/03/2023, 7:46 AM

## 2023-01-04 ENCOUNTER — Encounter (HOSPITAL_COMMUNITY): Payer: Self-pay | Admitting: Physical Medicine and Rehabilitation

## 2023-01-04 ENCOUNTER — Inpatient Hospital Stay (HOSPITAL_COMMUNITY): Payer: Medicare Other | Admitting: Registered Nurse

## 2023-01-04 ENCOUNTER — Inpatient Hospital Stay (HOSPITAL_COMMUNITY): Payer: Medicare Other

## 2023-01-04 ENCOUNTER — Encounter (HOSPITAL_COMMUNITY)
Admission: AD | Disposition: A | Discharge status: Home Health | Payer: Self-pay | Source: Intra-hospital | Attending: Physical Medicine and Rehabilitation

## 2023-01-04 DIAGNOSIS — K259 Gastric ulcer, unspecified as acute or chronic, without hemorrhage or perforation: Secondary | ICD-10-CM

## 2023-01-04 DIAGNOSIS — K2211 Ulcer of esophagus with bleeding: Secondary | ICD-10-CM

## 2023-01-04 DIAGNOSIS — K921 Melena: Secondary | ICD-10-CM

## 2023-01-04 DIAGNOSIS — I2510 Atherosclerotic heart disease of native coronary artery without angina pectoris: Secondary | ICD-10-CM

## 2023-01-04 DIAGNOSIS — K2289 Other specified disease of esophagus: Secondary | ICD-10-CM

## 2023-01-04 DIAGNOSIS — Q399 Congenital malformation of esophagus, unspecified: Secondary | ICD-10-CM

## 2023-01-04 DIAGNOSIS — K31811 Angiodysplasia of stomach and duodenum with bleeding: Secondary | ICD-10-CM

## 2023-01-04 DIAGNOSIS — K449 Diaphragmatic hernia without obstruction or gangrene: Secondary | ICD-10-CM

## 2023-01-04 DIAGNOSIS — K2210 Ulcer of esophagus without bleeding: Secondary | ICD-10-CM

## 2023-01-04 DIAGNOSIS — T18128A Food in esophagus causing other injury, initial encounter: Secondary | ICD-10-CM

## 2023-01-04 HISTORY — PX: ESOPHAGOGASTRODUODENOSCOPY: SHX5428

## 2023-01-04 HISTORY — DX: Gastric ulcer, unspecified as acute or chronic, without hemorrhage or perforation: K25.9

## 2023-01-04 HISTORY — DX: Angiodysplasia of stomach and duodenum with bleeding: K31.811

## 2023-01-04 HISTORY — PX: HOT HEMOSTASIS: SHX5433

## 2023-01-04 HISTORY — PX: BIOPSY: SHX5522

## 2023-01-04 HISTORY — DX: Ulcer of esophagus without bleeding: K22.10

## 2023-01-04 HISTORY — DX: Melena: K92.1

## 2023-01-04 LAB — CBC WITH DIFFERENTIAL/PLATELET
Abs Immature Granulocytes: 0.03 10*3/uL (ref 0.00–0.07)
Basophils Absolute: 0.1 10*3/uL (ref 0.0–0.1)
Basophils Relative: 1 %
Eosinophils Absolute: 0.2 10*3/uL (ref 0.0–0.5)
Eosinophils Relative: 2 %
HCT: 25.6 % — ABNORMAL LOW (ref 36.0–46.0)
Hemoglobin: 8.4 g/dL — ABNORMAL LOW (ref 12.0–15.0)
Immature Granulocytes: 1 %
Lymphocytes Relative: 12 %
Lymphs Abs: 0.8 10*3/uL (ref 0.7–4.0)
MCH: 28.5 pg (ref 26.0–34.0)
MCHC: 32.8 g/dL (ref 30.0–36.0)
MCV: 86.8 fL (ref 80.0–100.0)
Monocytes Absolute: 0.8 10*3/uL (ref 0.1–1.0)
Monocytes Relative: 11 %
Neutro Abs: 4.8 10*3/uL (ref 1.7–7.7)
Neutrophils Relative %: 73 %
Platelets: 277 10*3/uL (ref 150–400)
RBC: 2.95 MIL/uL — ABNORMAL LOW (ref 3.87–5.11)
RDW: 17.9 % — ABNORMAL HIGH (ref 11.5–15.5)
WBC: 6.6 10*3/uL (ref 4.0–10.5)
nRBC: 0 % (ref 0.0–0.2)

## 2023-01-04 LAB — BASIC METABOLIC PANEL
Anion gap: 6 (ref 5–15)
BUN: 21 mg/dL (ref 8–23)
CO2: 23 mmol/L (ref 22–32)
Calcium: 8.2 mg/dL — ABNORMAL LOW (ref 8.9–10.3)
Chloride: 105 mmol/L (ref 98–111)
Creatinine, Ser: 1 mg/dL (ref 0.44–1.00)
GFR, Estimated: 56 mL/min — ABNORMAL LOW (ref >60)
Glucose, Bld: 92 mg/dL (ref 70–99)
Potassium: 4.8 mmol/L (ref 3.5–5.1)
Sodium: 134 mmol/L — ABNORMAL LOW (ref 135–145)

## 2023-01-04 SURGERY — EGD (ESOPHAGOGASTRODUODENOSCOPY)
Anesthesia: Monitor Anesthesia Care

## 2023-01-04 MED ORDER — SUCRALFATE 1 GM/10ML PO SUSP
1.0000 g | Freq: Three times a day (TID) | ORAL | Status: DC
  Administered 2023-01-04 – 2023-01-12 (×31): 1 g via ORAL
  Filled 2023-01-04 (×30): qty 10

## 2023-01-04 MED ORDER — PROPOFOL 10 MG/ML IV BOLUS
INTRAVENOUS | Status: DC | PRN
  Administered 2023-01-04: 30 mg via INTRAVENOUS
  Administered 2023-01-04: 100 ug/kg/min via INTRAVENOUS

## 2023-01-04 MED ORDER — GLYCOPYRROLATE PF 0.2 MG/ML IJ SOSY
PREFILLED_SYRINGE | INTRAMUSCULAR | Status: DC | PRN
  Administered 2023-01-04: .1 mg via INTRAVENOUS

## 2023-01-04 MED ORDER — SODIUM CHLORIDE 0.9 % IV SOLN
INTRAVENOUS | Status: AC | PRN
  Administered 2023-01-04: 500 mL via INTRAVENOUS

## 2023-01-04 MED ORDER — SODIUM CHLORIDE 0.9 % IV SOLN: INTRAVENOUS | Status: DC

## 2023-01-04 MED ORDER — LIDOCAINE 2% (20 MG/ML) 5 ML SYRINGE
INTRAMUSCULAR | Status: DC | PRN
  Administered 2023-01-04: 60 mg via INTRAVENOUS

## 2023-01-04 NOTE — Progress Notes (Signed)
Physical Therapy Session Note  Patient Details  Name: Heather Molina MRN: [id number] Date of Birth: [birth year]  Today's Date: 01/04/2023 PT Individual Time: 1030-1125 PT Individual Time Calculation (min): 55 min   Short Term Goals: Week 3:  PT Short Term Goal 1 (Week 3): STG's = LTG's due to ELOS  Skilled Therapeutic Interventions/Progress Updates:      Therapy Documentation Precautions:  Precautions Precautions: Fall Precaution Comments: paraplegia Restrictions Weight Bearing Restrictions: No   Pt agreeable to PT session and expresses concern with pending procedure and undergoing of anesthesia. PT utilized therapeutic use of self to provide support and encouragement. PT engaged in discussion with patient regarding current level of function, PT goals, pt's goals and upcoming family training to prepare for discharge. Pt min A for supine>sit and with slide board transfer to [facility]. Pt CGA/supervision with power wheelchair mobility. Pt practiced adjusting wheelchair speed, turning, and obstacle negotiation prior to mod A slide board transfer to return to bed due to decreased energy. Pt left semi-reclined in bed with all needs in reach.    Therapy/Group: Individual Therapy  Truitt Leep Truitt Leep PT, DPT  01/04/2023, 7:48 AM

## 2023-01-04 NOTE — Progress Notes (Signed)
PROGRESS NOTE   Subjective/Complaints:  Pt reports is NPO for EGD-  Said due to dropping Hb/melena.   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   Placed on full liquid diet and regular diet tomorrow if OK.     ROS:  Pt denies SOB, abd pain, CP, N/V/C/D, and vision changes, new motor or sensory changes,  + Fatigue-she feels tired this morning-ongoing   Except for HPI  Objective:   No results found. Recent Labs    01/03/23 0453 01/04/23 0452  WBC  --  6.6  HGB 8.0* 8.4*  HCT 24.9* 25.6*  PLT  --  277     Recent Labs    01/02/23 0505 01/04/23 0452  NA 137 134*  K 4.6 4.8  CL 106 105  CO2 23 23  GLUCOSE 93 92  BUN 27* 21  CREATININE 0.92 1.00  CALCIUM 8.3* 8.2*        Intake/Output Summary (Last 24 hours) at 01/04/2023 1801 Last data filed at 01/04/2023 1527 Gross per 24 hour  Intake 660 ml  Output 2850 ml  Net -2190 ml     Pressure Injury 12/12/22 Sacrum Medial Deep Tissue Pressure Injury - Purple or maroon localized area of discolored intact skin or blood-filled blister due to damage of underlying soft tissue from pressure and/or shear. (Active)  12/12/22 1844  Location: Sacrum  Location Orientation: Medial  Staging: Deep Tissue Pressure Injury - Purple or maroon localized area of discolored intact skin or blood-filled blister due to damage of underlying soft tissue from pressure and/or shear.  Wound Description (Comments):   Present on Admission: Yes     Pressure Injury 12/14/22 Heel Left;Lateral;Posterior Stage 2 -  Partial thickness loss of dermis presenting as a shallow open injury with a red, pink wound bed without slough. (Active)  12/14/22 (first assessed by [facility]) 1221  Location: Heel  Location  Orientation: Left;Lateral;Posterior  Staging: Stage 2 -  Partial thickness loss of dermis presenting as a shallow open injury with a red, pink wound bed without slough.  Wound Description (Comments):   Present on Admission: No     Pressure Injury 12/14/22 Heel Right;Lateral Stage 2 -  Partial thickness loss of dermis presenting as a shallow open injury with a red, pink wound bed without slough. (Active)  12/14/22 (first assessed by [facility]) 1221  Location: Heel  Location Orientation: Right;Lateral  Staging: Stage 2 -  Partial thickness loss of dermis presenting as a shallow open injury with a red, pink wound bed without slough.  Wound Description (Comments):   Present on Admission: No     Pressure Injury 12/14/22 Ischial tuberosity Left;Medial Stage 2 -  Partial thickness loss of dermis presenting as a shallow open injury with a red, pink wound bed without slough. blister (Active)  12/14/22 (first assessed by [facility] nurse) 1221  Location: Ischial tuberosity  Location Orientation: Left;Medial  Staging: Stage 2 -  Partial thickness loss of dermis presenting as a shallow open injury with  a red, pink wound bed without slough.  Wound Description (Comments): blister  Present on Admission: No     Physical Exam: Vital Signs Blood pressure 132/77, pulse 88, temperature 97.6 F (36.4 C), temperature source Oral, resp. rate 19, height 5' (1.524 m), weight 50.5 kg, SpO2 100%.      General: awake, alert, appropriate, frail- no food due to NPO; supine in bed;NAD HENT: conjugate gaze; oropharynx dry- O2 via [place] CV: regular rate and irregular rhythm; no JVD Pulmonary: CTA B/L; no W/R/R- good air movement GI: soft, NT, ND, (+)BS- hypoactive BS Psychiatric: appropriate Neurological: Ox3  Moving bilateral upper extremities to gravity and resistance.   Sensation intact light touch in all 4 extremities.    Prior exams MS; was able to move RLE< but from abd muscles- not in LE muscles- no movement seen  in LLE either-  Skin: Groin incision site looks okay, bruising noted bilateral shins 2 IVs in place left upper extremity looks okay Extremities: unable to palpate L radial pulse but dopplers confirm pulse with rate in 60-70s, arm well perfused appearing, warm and pink, motor function preserved. Brachial pulse also unable to be felt. R radial pulse strong and palpable.    Neuro:   Mental Status: AAOx3, memory intact,  Speech/Languate:  fluent, follows simple commands CRANIAL NERVES: II: PERRL. Visual fields full III, IV, VI: EOM intact, no gaze preference or deviation V: normal sensation bilaterally VII: no asymmetry VIII: normal hearing to speech IX, X: normal palatal elevation XI: 5/5 head turn and 5/5 shoulder shrug bilaterally XII: Tongue midline     MOTOR: RUE: 5/5 Deltoid, 5/5 Biceps, 5/5 Triceps,5/5 Grip LUE: 5/5 Deltoid, 5/5 Biceps, 5/5 Triceps, 5/5 Grip RLE: 0 out of 5 throughout LLE: 0 out of 5 throughout, exam stable 11/6   SENSORY: Normal to touch all 4 extremities to light touch in bilateral upper and lower extremities, patchy altered sensation to cold in lower extremities   No ankle clonus bilaterally   Coordination: Normal finger to nose, no dysmetria  Assessment/Plan: 1. Functional deficits which require 3+ hours per day of interdisciplinary therapy in a comprehensive inpatient rehab setting. Physiatrist is providing close team supervision and 24 hour management of active medical problems listed below. Physiatrist and rehab team continue to assess barriers to discharge/monitor patient progress toward functional and medical goals  Care Tool:  Bathing    Body parts bathed by patient: Right arm, Left arm, Chest, Abdomen, Front perineal area, Face, Right upper leg, Left upper leg   Body parts bathed by helper: Right lower leg, Left lower leg, Buttocks     Bathing assist Assist Level: Minimal Assistance - Patient > 75% (roll in shower chair)     Upper Body  Dressing/Undressing Upper body dressing   What is the patient wearing?: Pull over shirt    Upper body assist Assist Level: Supervision/Verbal cueing    Lower Body Dressing/Undressing Lower body dressing      What is the patient wearing?: Incontinence brief, Pants     Lower body assist Assist for lower body dressing: Maximal Assistance - Patient 25 - 49%     Toileting Toileting    Toileting assist Assist for toileting: Total Assistance - Patient < 25%     Transfers Chair/bed transfer  Transfers assist  Chair/bed transfer activity did not occur: Safety/medical concerns  Chair/bed transfer assist level: Moderate Assistance - Patient 50 - 74%     Locomotion Ambulation   Ambulation assist   Ambulation activity did  not occur: Safety/medical concerns (paraplegia, fatigue)          Walk 10 feet activity   Assist  Walk 10 feet activity did not occur: Safety/medical concerns (paraplegia, fatigue)        Walk 50 feet activity   Assist Walk 50 feet with 2 turns activity did not occur: Safety/medical concerns (paraplegia, fatigue)         Walk 150 feet activity   Assist Walk 150 feet activity did not occur: Safety/medical concerns (paraplegia, fatigue)         Walk 10 feet on uneven surface  activity   Assist Walk 10 feet on uneven surfaces activity did not occur: Safety/medical concerns (paraplegia, fatigue)         Wheelchair     Assist Is the patient using a wheelchair?: Yes Type of Wheelchair: Power Wheelchair activity did not occur: Safety/medical concerns (paraplegia, fatigue)  Wheelchair assist level: Contact Guard/Touching assist Max wheelchair distance: 200    Wheelchair 50 feet with 2 turns activity    Assist    Wheelchair 50 feet with 2 turns activity did not occur: Safety/medical concerns (paraplegia, fatigue)   Assist Level: Contact Guard/Touching assist   Wheelchair 150 feet activity     Assist  Wheelchair  150 feet activity did not occur: Safety/medical concerns (paraplegia, fatigue)   Assist Level: Contact Guard/Touching assist   Blood pressure 132/77, pulse 88, temperature 97.6 F (36.4 C), temperature source Oral, resp. rate 19, height 5' (1.524 m), weight 50.5 kg, SpO2 100%.  Medical Problem List and Plan: 1. 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  Antithrombotics: -DVT/anticoagulation:  Pharmaceutical: Eliquis 2.5mg  BID-- on hold for EGD 01/04/23 11/11- can be restarted in 2 days at 2.5 mg BID per GI-  -antiplatelet therapy: Aspirin 81 mg daily- discontinued per cardiology recommendation    3. Pain Management: continue Tylenol, robaxin, oxycodone as needed 10/24- will wait to try Tramadol for pain/discomfort til have tried  Remeron 7.5mg  for sleep 10/31- stopped Remeron 4. Mood/Behavior/Sleep: LCSW to evaluate and provide emotional support             -continue paroxetine 20 mg daily (hx of depression)             -antipsychotic agents: n/a -12/13/22 hasn't been sleeping well, increase melatonin to 10mg  QHS, added Trazodone 25mg  PRN but advised to be cautious about using it too late.  10/21- will stop Trazodone- can impair ability to pee- will start Restoril 7.5 mg at bedtime for sleep.  10/22- cannot start Remeron due to risk of torsades de pointes- will con't Restoril  10/23- didn't sleep well again- will increase Restoril to 15 mg at bedtime 10/24- spoke to Cards- they are ok trying Remeron but wait on Tramadol-will start 15 mg at bedtime -will stop Restoril>> down to 7.5mg  -12/19/22 slept "wonderfully"! Cont regimen 10/28- Slept poorly- still on rmeron- sounds like every other night she  slept well 10/30- sleeping better 10/31- stopped remeron- slept ok yesterday as well  01/02/23 not sleeping well per patient, but d/t bed; monitor  for now 11/11- sleeping "as well as can".  5. Neuropsych/cognition: This patient is capable of making decisions on her own behalf.   6. Skin/Wound Care: Routine skin care checks   7. Fluids/Electrolytes/Nutrition: Routine Is and Os and follow-up chemistries  -01/02/23 BUN down to 27 this morning, Cr 0.92, monitor routinely   11/11- BUN down to 21 from 27 and Cr 1.00- doing better 8: Hypertension: monitor TID and prn (hx of a.fib see meds below). Monitor with mobility. Continue metoprolol 25mg  BID  -12/13/22 BPs a little up, monitor for trend 10/21- BP's running high up to 180s- systolic- but had documented BP 87/64 this AM- will wait to titrate BP meds. 10/22- BP still in 150s- but no LOW BP's this AM- will see how does in therapy- 10/23- reduced Metoprolol per pt request to 12.5 mg BID- might need Norvasc for BP control without affecting HR  10/24- will add Norvasc 2.5 mg daily since BP elevated and not coming down  10/25- calling IM- maybe they can help- just added Norvasc yesterday -12/19/22 BPs improving a little this morning, monitor -12/20/22 BPs still a tad elevated but stable, monitor a couple more days before deciding on changes for now 10/28- BP on low side, but Hb low- will transfuse 2 units 10/29- BP looking more labile- but better than it was- monitor for now 11/1: continue current regimen -12/26/22 BPs variable, monitor trend for now 11/5- BP running 130s- 170's- Will increase Norvasc to 5 mg daily  11/7 BP stable, metoprolol discontinued by cardiology 11/8 BP above goal today.  If continues to be elevated cardiology recommends addition of ARB and would not recommend further titration of amlodipine. Will hold off on change for now as would like to avoid hypotension -11/9-10/24 BPs a bit better, cont regimen  11/11- BP doing OK-  intermittently in 140's- con't regimen Vitals:   01/02/23 1345 01/02/23 1829 01/03/23 0334 01/03/23 1326  BP: (!) 142/51 (!) 138/53 (!) 145/55 (!) 147/57   01/03/23 1933 01/04/23 0518 01/04/23 1235 01/04/23 1335  BP: (!) 151/70 (!) 150/69 (!) 150/67 123/68   01/04/23 1340 01/04/23 1350 01/04/23 1400 01/04/23 1430  BP: 136/64 (!) 143/55 (!) 144/67 132/77    9: Hyperlipidemia: continue pravastatin 40mg  daily   10: COPD, O2 dependent 2L via :             -continue Brovana nebs BID             -continue Yupelri neb daily   10/21- Has been on 2L O2 at home per pt and chart  10/25- ABG shows CO2 too low- needs to reduce O2 levels  10/31- Sats ok- con't O2  11: CAD: continue asa and statin   12: Atrial fibrillation, paroxysmal: maintained on Eliquis reversed pre-operatively             -continue amiodarone 100 mg daily             -continue metoprolol tartrate 25 mg BID             -cleared by [facility] to re-start Eliquis>pharmacy consult placed   10/25- reduced Metoprolol to 12.5 mg BID earlier in week - doing better  10/30- BP doing better 11/8 cardiology does not feel she is currently in A-fib, they recommend continue amiodarone 100 mg daily and avoid nodal agents, continue Eliquis  11/11- held Eliquis due to GI bleed- can restart 11/13 13: 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 10/22- not voiding  -U/A (-)  will add Flomax 0.4 mg q supper to see if she can void- I don't think it's likely. D/w nursing due to low amount of caths-  -12/19/22 U/A overnight looks infectious, will start Bactrim DS BID x5d, UCx pending; monitor -12/20/22 UCx with >100k CFU of K. Pneumoniae, sensitive to bactrim, cont abx course 10/29- Being cathed- volumes low- pushing fluids, but will also give 1 L IVFs 10/30- not drinking- Got IVFs but BUN up to 45 and Cr 1.41- will call renal- and recheck in AM-- a little improved on 10/31 labs 11/5- drinking much better- volumes looking better- and Cr down to 1.16 and BUN 29 from 40's 11/11- Volumes on caths doing better 17: ABLA: stable; follow CBC trend             -consider oral iron supplement -12/13/22 Hgb slowly downtrending from postop 8.8 on 10/14>7.6>7.5>7.3>7.0 yesterday; pt asymptomatic at this time; would recheck tomorrow but if still at the 7.0-range then would consider transfusion to help with therapy tolerance; unclear what her prior baseline was (has hgb 12.2 back in July but then down to 9's).    10/21- Hb 7.4 today  10/23- Hb 7.2 yesterday- will check in AM 10/24- Hb down to 7.0- will d/w pt if she wants to be transfused. If so, will give 1 unit pRBCs.  Doesn't have location of losing blood- will check FOBT x3  10/25- Hb 8.1- likely too dry  10/28- Hb 6.7- will transfuse 2 units 10/29- Hb much better AND pt feels MUCH better per pt- Hb up to 9.9- likely so high since pt really dry- will also rewrite for FOBT- hasn't been done  10/30- no bowel results- so cannot check FOBT yet.  10/31- had 2 extra large Bms last night- didn't check FOBT_ will order again. Will d/w nursing 11/4: Hgb reviewed and is stable 11/5- Hb 9.2 11/8 HGB down to 8.7 yesterday, FOBT postive, has had multiple dark stools recovered, will contact GI, continue PPI -GI recommends to hold Eliquis per note.  Discussed with Dr. Vivianne Master discontinue for now.  Eliquis stopped.  Appreciate  GI/cardiology assistance -01/02/23 Hgb stable at 8.2 today, monitor -01/03/23 Hgb down to 8.0; transfuse to keep hgb >7 per GI notes; plan for EGD tomorrow; per notes, PPI IV but PO currently, asked GI and they said PO is fine. Will leave order in place.  11/11- Hb 8.4- will recheck in AM after EGD and see if going up at all- argon used for lesion- might also have esophageal candida- waiting on biopsy results-  -on PPI BID and Carafate QID for 2 weeks per GI- they signed off 18: AKI: improving; CMP 12/12/22 showing Cr 1.31; monitor Monday BMPs 10/21- Cr 1.09 down from 1.31 and BUN 25 down from 25- however is drinking poorly and has poor output- 10/23- Says  drinking better- will check BMP in AM  10/25- Cr 0.9 and BUN slightly high at 22- but based on Hb increase, likely dry 10/28- Cr 1.4 and BUN elevated- will give blood and recheck BMP in AM 10/29- BUN up to 40 and Cr 1.41- will need to give IVFs- tried seeing if blood would help, but still needs fluids- will her BUN/Cr- will replete 75 cc/hour after therapy today and recheck BMP in AM- of note, her Cr was running ~ 1.0 and her BUN ~ 25- so big change- will push fluids as well- will give 75cc/hour for 1 liter  10/31- likely form Septra- will stop ABX-  11/4: Cr reviewed and is improving 11/5- Cr 1.16 and BUN 29 down from 40's 11/8 patient was given IV fluids yesterday for elevated BUN, recheck labs today -01/02/23 BUN down to 27, Cr 0.92, monitor 11/11- BUN 21 and Cr 1.0- doing better 19: neurogenic bowel: bowel program ordered -Dulcolax suppository ordered -12/13/22 per pt LBM yesterday, monitor 10/21- LBM this afternoon/AM- incontinent- educated pt NEEDs bowel program since not having Bms at the time to avoid therapies- going when doesn't want to- this will train gut to go when we wants her to go.  10/22- continue to refuse bowel program-in spite of education of bowel program and need for it.  10/23- pt did last night- had BM with bowel program  and one this AM, but educated can take 3-6 weeks to train gut, but should be better- had bowel incontinence for 20 years 10/24- no bowel program documented  but had small BM's at midnight and 5am- said wasn't incontinent?will check on this 10/25- asked pt to get more dig stim this evening since likely cause of bowel incontinent in AM -12/20/22 reports BM this morning but no BM since 12/17/22 documented, no report to nursing staff given; monitor closely but if no BM by tomorrow may need to consider additional adjustments 10/29- LBM yesterday AM, and small with bowel program 10/27-no results last night- if no BM tonight, will give sorbitol/check KUB  10/30- no significant BM for 3 days- will give Sorbitol 30cc at 2pm for bowel program- also add Senna 1 tab daily for meds- 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  -LBM 11/3, continue current regimen Reviewed chart and had BM 11/5 -01/03/23 no further BMs since 11/8, just mucus; see GI plan above 11/11- still having mucus- will order KUB 20. Poor Appetite 10/22- will add Megace for poor appetite since cannot add Remeron and too sedated to add Periactin. Will also order push fluids since not drinking great- although her BUN?Cr looking better- BUN 25 and Cr 1.08-  10/23- pt feels she's drinking 6+ cups/day- ate muffin and some eggs this AM- better than last 2 days-  10/24- 25% of tray at most- will add Remeron 15 mg at bedtime- ok'd by Cards.  10/25- changed remeron to 7.5 mg QHS 10/30- won't eat anything for breakfast- small appetite overall-  10/31- ate 1 pancake this AM- better than normal 11/11- on full liquid diet today- can switch back to regular diet tomorrow 21. Prolonged QT interval 10/24- spoke with Cards- they suggested starting Remeron 15 mg at bedtime and then rechecking EKG ina few days to see if can add Tramadol. 10/29- doing ok with remeron- wait to add tramadol for now    22. Oversedation/lethargy/ UTI 10/25- septis and  ABG work up- when speaking with PA- appeared to have ST elevation- asked them to call IM consult-  -  12/19/22 sleepy but arousable today, U/A+, started bactrim as above, awaiting UCx; CT head neg last night, CBC/BMP stable, TSH/T4 WNL, CXR stable; suspect UTI as source. Monitor. See above #16 11/6: alert and bright this morning 11/7 -Patient alert and oriented x 4, following commands.  Vital signs stable.  Will recheck lab work and UA culture.  Overall she appears stable this morning although fatigue may be waxing and waning. 11/8 patient was restarted on Bactrim last night for suspected UTI, monitor renal function  23. L radial pulse nonpalpable but dopplerable, BPs different in that arm-- unclear when this started, had some pain in L deltoid area yesterday but seemed to be better today. Suspect this pulse/BP discrepancy is 2/2 her vascular issue already, but reached out to vascular surgery to verify. Dr. Karin Lieu returned page, stated this is known since her surgery; nothing to do or be concerned about.   24. Pulsatile abdominal aorta: not noticed previously, not mentioned in notes, but today 12/20/22 I noticed it was very prominently pulsatile and pt reported it being sore in that area; spoke with Dr. Karin Lieu again of [facility], recommended getting CTA Abd/pelv to evaluate for AAA. 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 Bradycardia -11/7 patient is being followed by cardiology.  Metoprolol has been stopped and ASA duration was recommended-ASA has been stopped.  -11/8 heart rate doing better today  I spent a total of 39   minutes on total care today- >50% coordination of care- due to  Review of GI notes, all notes since last Tuesday, all labs, vitals and lack of BM- addressing   LOS: 23 days A FACE TO FACE EVALUATION WAS PERFORMED  Mita Vallo 01/04/2023, 6:01 PM

## 2023-01-04 NOTE — Op Note (Signed)
[facility] Patient Name: Heather Molina Procedure Date : 01/04/2023 MRN: [id number] Attending MD: Dub Amis. Tomasa Rand , MD, [id number] Date of Birth: Mar 18, 1940 CSN: [id number] Age: 82 Admit Type: Inpatient Procedure:                Upper GI endoscopy Indications:              Acute post hemorrhagic anemia, Heme positive stool Providers:                Lorin Picket E. Tomasa Rand, MD, Kandice Robinsons,                            Technician, Doristine Mango, RN Referring MD:              Medicines:                Monitored Anesthesia Care Complications:            No immediate complications. Estimated Blood Loss:     Estimated blood loss was minimal. Procedure:                Pre-Anesthesia Assessment:                           - Prior to the procedure, a History and Physical                            was performed, and patient medications and                            allergies were reviewed. The patient's tolerance of                            previous anesthesia was also reviewed. The risks                            and benefits of the procedure and the sedation                            options and risks were discussed with the patient.                            All questions were answered, and informed consent                            was obtained. Prior Anticoagulants: The patient has                            taken Eliquis (apixaban), last dose was 2 days                            prior to procedure. ASA Grade Assessment: III - A                            patient with severe systemic disease. After  reviewing the risks and benefits, the patient was                            deemed in satisfactory condition to undergo the                            procedure.                           After obtaining informed consent, the endoscope was                            passed under direct vision. Throughout the                             procedure, the patient's blood pressure, pulse, and                            oxygen saturations were monitored continuously. The                            GIF-H190 ([id number]) Olympus endoscope was introduced                            through the mouth, and advanced to the second part                            of duodenum. The upper GI endoscopy was                            accomplished without difficulty. The patient                            tolerated the procedure well. Scope In: Scope Out: Findings:      Few superficial esophageal ulcers with stigmata of recent bleeding were       found in the middle third of the esophagus. The largest lesion was 7 mm       in largest dimension. There was some adherent whitish residue/plaques in       the area as well. Biopsies were taken at the periphery of the ulcers       with a cold forceps for histology. Estimated blood loss was minimal.      A small amount of residual food debris was found in the lower third of       the esophagus. This was able to be suctioned and cleansed      The lower third of the esophagus was significantly tortuous and       foreshortened.      The exam of the esophagus was otherwise normal.      A 7 cm hiatal hernia with a single clean based superficial Cameron ulcer       was found. The proximal extent of the gastric folds (end of tubular       esophagus) was 33 cm from the incisors. The hiatal narrowing was 40 cm       from the incisors. The Z-line was 33  cm from the incisors.      A small amount of food (residue) was found in the gastric fundus.      The exam of the stomach was otherwise normal.      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 Coagulation for hemostasis using argon       plasma was successful. Estimated blood loss was minimal.      The exam of the duodenum was otherwise normal. Impression:               - Esophageal ulcers with  stigmata of recent                            bleeding. Biopsied. Query if this may be                            candidiasis. With known aortic graft, biopsies                            taken with caution.                           - A small amount of food in the lower third of the                            esophagus.                           - Tortuous esophagus.                           - 7 cm hiatal hernia with a single Cameron ulcer.                           - A small amount of food (residue) in the stomach.                           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 Treated with argon plasma coagulation                            (APC). I suspect this was the source of the                            patient's blood loss anemia/FOBT. Moderate Sedation:      N/A Recommendation:           - Return patient to hospital ward for ongoing care.                           - Full liquid diet today. Okay to advance to  regular diet tomorrow if no further evidence of                            bleeding.                           - Resume Eliquis (apixaban) at prior dose in 2 days.                           - Please give Protonix 40 mg PO BID for 8 weeks                           - Recommend carafate 1 gram QID x 2 weeks to help                            esophageal and Cameron's lesions heal.                           - GI will sign off at this time. Please reconsult                            if there is further evidence of bleeding. Procedure Code(s):        --- Professional ---                           [phone], 59, Esophagogastroduodenoscopy, flexible,                            transoral; with control of bleeding, any method                           43239, Esophagogastroduodenoscopy, flexible,                            transoral; with biopsy, single or multiple Diagnosis Code(s):        --- Professional  ---                           K22.11, Ulcer of esophagus with bleeding                           Q39.9, Congenital malformation of esophagus,                            unspecified                           K44.9, Diaphragmatic hernia without obstruction or                            gangrene                           K25.9, Gastric ulcer, unspecified as acute or  chronic, without hemorrhage or perforation                           K31.811, Angiodysplasia of stomach and duodenum                            with bleeding                           D62, Acute posthemorrhagic anemia                           R19.5, Other fecal abnormalities CPT copyright 2022 American [employer] Association. All rights reserved. The codes documented in this report are preliminary and upon coder review may  be revised to meet current compliance requirements. Dudley Mages E. Tomasa Rand, MD 01/04/2023 1:49:43 PM This report has been signed electronically. Number of Addenda: 0

## 2023-01-04 NOTE — Progress Notes (Signed)
Occupational Therapy Weekly Progress Note  Patient Details  Name: Heather Molina MRN: [id number] Date of Birth: [birth year]  Beginning of progress report period: December 28, 2022 End of progress report period: January 04, 2023  Today's Date: 01/04/2023 OT Individual Time: 8295-6213  OT Individual Time Calculation (min): 58 min  and Today's Date: 01/04/2023 OT Missed Time:  30 min Missed Time Reason:  patient procedure   Patient has met 2 of 2 short term goals.  Pt demonstrating increased sitting balance, transfers, dressing, bathing and PWC mobility skills. Pt family training scheduled 11/12 and family meeting scheduled 11/14.   Patient continues to demonstrate the following deficits: muscle weakness and muscle paralysis, decreased cardiorespiratoy endurance, unbalanced muscle activation, and decreased sitting balance, decreased postural control, and decreased balance strategies and therefore will continue to benefit from skilled OT intervention to enhance overall performance with BADL and Reduce care partner burden.  Patient progressing toward long term goals.  Continue plan of care.  OT Short Term Goals Week 3:  OT Short Term Goal 1 (Week 3): Pt will complete commode transfer with Mod A OT Short Term Goal 1 - Progress (Week 3): Met OT Short Term Goal 2 (Week 3): Pt will complete static sitting EOM or EOB with consistent SBA OT Short Term Goal 2 - Progress (Week 3): Met Week 4:  OT Short Term Goal 1 (Week 4): STG=LTG d/t ELOS  Skilled Therapeutic Interventions/Progress Updates:      Therapy Documentation Precautions:  Precautions Precautions: Fall Precaution Comments: paraplegia Restrictions Weight Bearing Restrictions: No Session 1 General: "I have that procedure today" Pt supine in bed upon OT arrival, agreeable to OT session.  Pain: no pain reported  ADL: Bed mobility: Min A at end of rolling to assist with LE management using bed rails Grooming: SBA bed level for  brushing hair Toilet transfer: Min A slide board transfer EOB><DABSC  LB dressing: Mod A, pt able to bring legs up in figure 4 method with slight assistance to put pant leg over Lt toe, pt able to pull pants up to hips, OT assisted with pants while rolling. Pt reports she does not want to wear pants at home d/t increased effort to don pants Footwear: total A for TEDs and shoes bed level  Static sitting: pt seated EOB for discussion below SBA with UE support on bed.   Other Treatments: Pt educated on different ways to complete bowel routine (bed level versus commode level) and benefits of either method. Pt and OT discussed methods in detail and what would work best for pt and caregivers.   Pt supine in bed with bed alarm activated, 2 bed rails up, call light within reach and 4Ps assessed.   Therapy/Group: Individual Therapy [name], OTD, OTR/L 01/04/2023, 4:09 PM

## 2023-01-04 NOTE — Progress Notes (Signed)
Met with [name]-pt's daughter who reports her Mom really does not want a power chair and would do better in a manual wheelchair. Have emailed PT to discuss with pt. Daughter feels she will not use the power chair at home due to too large and will still require assist with it. Will ask Sinclair-PT to address with pt and will discuss at family meeting on Thursday

## 2023-01-04 NOTE — Progress Notes (Signed)
Removed NPO sign from doorway.  Daughter wanted to video tape nurse providing I/O cath.  Per nurse was informed that unable to allow.  Daughter has not performed I/O cath.  Left QR code of IO cath via exist care and instructions for bowel program.  Nurse reports that [name] (caregiver) comes and goes but not staying.  Informed nurse to have [facility] cath patient and she is to do bowel program this week with return demonstration.

## 2023-01-04 NOTE — Interval H&P Note (Signed)
History and Physical Interval Note:  01/04/2023 12:56 PM  Heather Molina  has presented today for surgery, with the diagnosis of Melena, acute blood loss anemia.  The various methods of treatment have been discussed with the patient and family. After consideration of risks, benefits and other options for treatment, the patient has consented to  Procedure(s): ESOPHAGOGASTRODUODENOSCOPY (EGD) (N/A) as a surgical intervention.  The patient's history has been reviewed, patient examined, no change in status, stable for surgery.  I have reviewed the patient's chart and labs.  Questions were answered to the patient's satisfaction.     Jenel Lucks

## 2023-01-04 NOTE — Anesthesia Postprocedure Evaluation (Signed)
Anesthesia Post Note  Patient: Heather Molina  Procedure(s) Performed: ESOPHAGOGASTRODUODENOSCOPY (EGD) HOT HEMOSTASIS (ARGON PLASMA COAGULATION/BICAP) BIOPSY     Patient location during evaluation: PACU Anesthesia Type: MAC Level of consciousness: awake and alert Pain management: pain level controlled Vital Signs Assessment: post-procedure vital signs reviewed and stable Respiratory status: spontaneous breathing, nonlabored ventilation, respiratory function stable and patient connected to nasal cannula oxygen Cardiovascular status: stable and blood pressure returned to baseline Postop Assessment: no apparent nausea or vomiting Anesthetic complications: no   There were no known notable events for this encounter.  Last Vitals:  Vitals:   01/04/23 1350 01/04/23 1400  BP: (!) 143/55 (!) 144/67  Pulse: 98 96  Resp: 19 12  Temp:    SpO2: 99% 99%    Last Pain:  Vitals:   01/04/23 1400  TempSrc:   PainSc: 0-No pain                 [place]

## 2023-01-04 NOTE — Anesthesia Preprocedure Evaluation (Signed)
Anesthesia Evaluation  Patient identified by MRN, date of birth, ID band Patient awake    Reviewed: Allergy & Precautions, H&P , NPO status , Patient's Chart, lab work & pertinent test results  Airway Mallampati: II  TM Distance: >3 FB Neck ROM: Full    Dental no notable dental hx.    Pulmonary COPD, former smoker   Pulmonary exam normal breath sounds clear to auscultation       Cardiovascular hypertension, + CAD  Normal cardiovascular exam Rhythm:Regular Rate:Normal     Neuro/Psych  PSYCHIATRIC DISORDERS Anxiety Depression    CVA    GI/Hepatic Neg liver ROS,GERD  ,,melena   Endo/Other  negative endocrine ROS    Renal/GU negative Renal ROS  negative genitourinary   Musculoskeletal negative musculoskeletal ROS (+)    Abdominal   Peds negative pediatric ROS (+)  Hematology negative hematology ROS (+)   Anesthesia Other Findings Melena, acute blood loss anemia  Reproductive/Obstetrics negative OB ROS                             Anesthesia Physical Anesthesia Plan  ASA: 3  Anesthesia Plan: MAC   Post-op Pain Management:    Induction: Intravenous  PONV Risk Score and Plan: Propofol infusion, Treatment may vary due to age or medical condition and Ondansetron  Airway Management Planned: Natural Airway and Simple Face Mask  Additional Equipment:   Intra-op Plan:   Post-operative Plan: Extubation in OR  Informed Consent: I have reviewed the patients History and Physical, chart, labs and discussed the procedure including the risks, benefits and alternatives for the proposed anesthesia with the patient or authorized representative who has indicated his/her understanding and acceptance.     Dental advisory given  Plan Discussed with: CRNA  Anesthesia Plan Comments:        Anesthesia Quick Evaluation

## 2023-01-04 NOTE — Transfer of Care (Signed)
Immediate Anesthesia Transfer of Care Note  Patient: Janaii Montagna Dobias  Procedure(s) Performed: ESOPHAGOGASTRODUODENOSCOPY (EGD) HOT HEMOSTASIS (ARGON PLASMA COAGULATION/BICAP) BIOPSY  Patient Location: PACU  Anesthesia Type:MAC  Level of Consciousness: awake, alert , and oriented  Airway & Oxygen Therapy: Patient connected to nasal cannula oxygen  Post-op Assessment: Report given to RN and Post -op Vital signs reviewed and stable  Post vital signs: Reviewed and stable  Last Vitals:  Vitals Value Taken Time  BP 123/68 01/04/23 1335  Temp    Pulse 107 01/04/23 1337  Resp 13 01/04/23 1337  SpO2 99 % 01/04/23 1337  Vitals shown include unfiled device data.  Last Pain:  Vitals:   01/04/23 1235  TempSrc: Temporal  PainSc: 0-No pain      Patients Stated Pain Goal: 1 (12/30/22 2038)  Complications: There were no known notable events for this encounter.

## 2023-01-05 LAB — HEMOGLOBIN AND HEMATOCRIT, BLOOD
HCT: 26.3 % — ABNORMAL LOW (ref 36.0–46.0)
Hemoglobin: 8.5 g/dL — ABNORMAL LOW (ref 12.0–15.0)

## 2023-01-05 LAB — SURGICAL PATHOLOGY

## 2023-01-05 NOTE — Progress Notes (Signed)
Physical Therapy Session Note  Patient Details  Name: Heather Molina MRN: [id number] Date of Birth: [birth year]  Today's Date: 01/05/2023 PT Individual Time: 1300-1400 PT Individual Time Calculation (min): 60 min   Short Term Goals: Week 3:  PT Short Term Goal 1 (Week 3): STG's = LTG's due to ELOS  Skilled Therapeutic Interventions/Progress Updates:      Therapy Documentation Precautions:  Precautions Precautions: Fall Precaution Comments: paraplegia Restrictions Weight Bearing Restrictions: No  Pt and daughter engaged in discussion with PT regarding w/c selection. PT educated pt and family regarding current recommendation of power wheelchair with elevation, tilt, and recline to prevent increased skin breakdown and increase independence with mobility. Pt and family declined as they report patient largely sedentary and will spend "90% of time in recliner" or bed. PT educated pt on foldable tilt in space wheelchair but will not fit in patient's vehicle. Pt daughter purchased standard 16 x 16 manual wheelchair and cushion. PT educated on bed mobility, slide board placement and transfers, and caregiver dependent pressure relief. PT provided handout with pressure relief instruction, frequency, and demonstration. PT provided mod visual and verbal cues for caregiver body mechanics with above mobility. Pt left semi-reclined in bed with all needs in reach and observed bleeding from IV, nurse notified and daughter applied pressure while waiting for medical care.     Therapy/Group: Individual Therapy  Truitt Leep Truitt Leep PT, DPT  01/05/2023, 3:49 PM

## 2023-01-05 NOTE — Progress Notes (Signed)
PROGRESS NOTE   Subjective/Complaints:  Pt reports feeling better off Elqiuis- also notes that hadn't been able to taste for 2 weeks, but since EGD, can taste now- thinks had thrush, but resolved "now".   Small results with bowel program last night- not clear if mucus only or not KUB looks good- probably not eating much  Changed to regualr diet- hates the supplement ice cream- wants regular icecream, but will drink a chocolate supplement for me.     ROS:    Pt denies SOB, abd pain, CP, N/V/C/D, and vision changes    + Fatigue-she feels tired this morning-ongoing   Except for HPI  Objective:   DG Abd 1 View  Result Date: 01/04/2023 CLINICAL DATA:  [id number] Abdominal pain [id number] EXAM: ABDOMEN - 1 VIEW COMPARISON:  CT abdomen pelvis 12/20/2022 FINDINGS: Right mid abdomen surgical clips. The bowel gas pattern is normal. No radio-opaque calculi or other significant radiographic abnormality are seen. Partially visualized thoracic aorta stent graft. IMPRESSION: Nonobstructive bowel gas pattern. Electronically Signed   By: Tish Frederickson M.D.   On: 01/04/2023 23:05   Recent Labs    01/04/23 0452 01/05/23 0508  WBC 6.6  --   HGB 8.4* 8.5*  HCT 25.6* 26.3*  PLT 277  --      Recent Labs    01/04/23 0452  NA 134*  K 4.8  CL 105  CO2 23  GLUCOSE 92  BUN 21  CREATININE 1.00  CALCIUM 8.2*        Intake/Output Summary (Last 24 hours) at 01/05/2023 7829 Last data filed at 01/05/2023 0856 Gross per 24 hour  Intake 3347 ml  Output 2525 ml  Net 822 ml     Pressure Injury 12/12/22 Sacrum Medial Deep Tissue Pressure Injury - Purple or maroon localized area of discolored intact skin or blood-filled blister due to damage of underlying soft tissue from pressure and/or shear. (Active)  12/12/22 1844  Location: Sacrum  Location Orientation: Medial  Staging: Deep Tissue Pressure Injury - Purple or maroon localized area  of discolored intact skin or blood-filled blister due to damage of underlying soft tissue from pressure and/or shear.  Wound Description (Comments):   Present on Admission: Yes     Pressure Injury 12/14/22 Heel Left;Lateral;Posterior Stage 2 -  Partial thickness loss of dermis presenting as a shallow open injury with a red, pink wound bed without slough. (Active)  12/14/22 (first assessed by [facility]) 1221  Location: Heel  Location Orientation: Left;Lateral;Posterior  Staging: Stage 2 -  Partial thickness loss of dermis presenting as a shallow open injury with a red, pink wound bed without slough.  Wound Description (Comments):   Present on Admission: No     Pressure Injury 12/14/22 Heel Right;Lateral Stage 2 -  Partial thickness loss of dermis presenting as a shallow open injury with a red, pink wound bed without slough. (Active)  12/14/22 (first assessed by [facility]) 1221  Location: Heel  Location Orientation: Right;Lateral  Staging: Stage 2 -  Partial thickness loss of dermis presenting as a shallow open injury with a red, pink wound bed without slough.  Wound Description (Comments):   Present on Admission: No  Pressure Injury 12/14/22 Ischial tuberosity Left;Medial Stage 2 -  Partial thickness loss of dermis presenting as a shallow open injury with a red, pink wound bed without slough. blister (Active)  12/14/22 (first assessed by [facility] nurse) 1221  Location: Ischial tuberosity  Location Orientation: Left;Medial  Staging: Stage 2 -  Partial thickness loss of dermis presenting as a shallow open injury with a red, pink wound bed without slough.  Wound Description (Comments): blister  Present on Admission: No     Physical Exam: Vital Signs Blood pressure (!) 148/59, pulse 88, temperature (!) 97.5 F (36.4 C), resp. rate 19, height 5' (1.524 m), weight 50.5 kg, SpO2 98%.       General: awake, alert, appropriate, sitting up in bed- frail; NAD HENT: conjugate gaze; oropharynx more  moist- grits on mouth- no oral thrush- O2 by Lutz CV: regular rate and irregular rhythm; no JVD Pulmonary: CTA B/L; no W/R/R- GI: soft, NT, ND, (+)BS Psychiatric: appropriate- smiling more this AM- looks better than had been Neurological: Ox3  Moving bilateral upper extremities to gravity and resistance.   Sensation intact light touch in all 4 extremities.    Prior exams MS; was able to move RLE< but from abd muscles- not in LE muscles- no movement seen in LLE either-  Skin: Groin incision site looks okay, bruising noted bilateral shins 2 IVs in place left upper extremity looks okay Extremities: unable to palpate L radial pulse but dopplers confirm pulse with rate in 60-70s, arm well perfused appearing, warm and pink, motor function preserved. Brachial pulse also unable to be felt. R radial pulse strong and palpable.    Neuro:   Mental Status: AAOx3, memory intact,  Speech/Languate:  fluent, follows simple commands CRANIAL NERVES: II: PERRL. Visual fields full III, IV, VI: EOM intact, no gaze preference or deviation V: normal sensation bilaterally VII: no asymmetry VIII: normal hearing to speech IX, X: normal palatal elevation XI: 5/5 head turn and 5/5 shoulder shrug bilaterally XII: Tongue midline     MOTOR: RUE: 5/5 Deltoid, 5/5 Biceps, 5/5 Triceps,5/5 Grip LUE: 5/5 Deltoid, 5/5 Biceps, 5/5 Triceps, 5/5 Grip RLE: 0 out of 5 throughout LLE: 0 out of 5 throughout, exam stable 11/6   SENSORY: Normal to touch all 4 extremities to light touch in bilateral upper and lower extremities, patchy altered sensation to cold in lower extremities   No ankle clonus bilaterally   Coordination: Normal finger to nose, no dysmetria  Assessment/Plan: 1. Functional deficits which require 3+ hours per day of interdisciplinary therapy in a comprehensive inpatient rehab setting. Physiatrist is providing close team supervision and 24 hour management of active medical problems listed  below. Physiatrist and rehab team continue to assess barriers to discharge/monitor patient progress toward functional and medical goals  Care Tool:  Bathing    Body parts bathed by patient: Right arm, Left arm, Chest, Abdomen, Front perineal area, Face, Right upper leg, Left upper leg   Body parts bathed by helper: Right lower leg, Left lower leg, Buttocks     Bathing assist Assist Level: Minimal Assistance - Patient > 75% (roll in shower chair)     Upper Body Dressing/Undressing Upper body dressing   What is the patient wearing?: Pull over shirt    Upper body assist Assist Level: Supervision/Verbal cueing    Lower Body Dressing/Undressing Lower body dressing      What is the patient wearing?: Incontinence brief, Pants     Lower body assist Assist for lower body dressing:  Maximal Assistance - Patient 25 - 49%     Toileting Toileting    Toileting assist Assist for toileting: Total Assistance - Patient < 25%     Transfers Chair/bed transfer  Transfers assist  Chair/bed transfer activity did not occur: Safety/medical concerns  Chair/bed transfer assist level: Moderate Assistance - Patient 50 - 74%     Locomotion Ambulation   Ambulation assist   Ambulation activity did not occur: Safety/medical concerns (paraplegia, fatigue)          Walk 10 feet activity   Assist  Walk 10 feet activity did not occur: Safety/medical concerns (paraplegia, fatigue)        Walk 50 feet activity   Assist Walk 50 feet with 2 turns activity did not occur: Safety/medical concerns (paraplegia, fatigue)         Walk 150 feet activity   Assist Walk 150 feet activity did not occur: Safety/medical concerns (paraplegia, fatigue)         Walk 10 feet on uneven surface  activity   Assist Walk 10 feet on uneven surfaces activity did not occur: Safety/medical concerns (paraplegia, fatigue)         Wheelchair     Assist Is the patient using a wheelchair?:  Yes Type of Wheelchair: Power Wheelchair activity did not occur: Safety/medical concerns (paraplegia, fatigue)  Wheelchair assist level: Contact Guard/Touching assist Max wheelchair distance: 200    Wheelchair 50 feet with 2 turns activity    Assist    Wheelchair 50 feet with 2 turns activity did not occur: Safety/medical concerns (paraplegia, fatigue)   Assist Level: Contact Guard/Touching assist   Wheelchair 150 feet activity     Assist  Wheelchair 150 feet activity did not occur: Safety/medical concerns (paraplegia, fatigue)   Assist Level: Contact Guard/Touching assist   Blood pressure (!) 148/59, pulse 88, temperature (!) 97.5 F (36.4 C), resp. rate 19, height 5' (1.524 m), weight 50.5 kg, SpO2 98%.  Medical Problem List and Plan: 1. 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  Antithrombotics: -DVT/anticoagulation:  Pharmaceutical: Eliquis 2.5mg  BID-- on hold for EGD 01/04/23 11/11- can be restarted in 2 days at 2.5 mg BID per GI-  -antiplatelet therapy: Aspirin 81 mg daily- discontinued per cardiology recommendation    3. Pain Management: continue Tylenol, robaxin, oxycodone as needed 10/24- will wait to try Tramadol for pain/discomfort til have tried  Remeron 7.5mg  for sleep 10/31- stopped Remeron 4. Mood/Behavior/Sleep: LCSW to evaluate and provide emotional support             -continue paroxetine 20 mg daily (hx of depression)             -antipsychotic agents: n/a -12/13/22 hasn't been sleeping well, increase melatonin to 10mg  QHS, added Trazodone 25mg  PRN but advised to be cautious about using it too late.  10/21- will stop Trazodone- can impair ability to pee- will start Restoril 7.5 mg at  bedtime for sleep.  10/22- cannot start Remeron due to risk of torsades de pointes- will con't Restoril  10/23- didn't sleep well again- will increase Restoril to 15  mg at bedtime 10/24- spoke to Cards- they are ok trying Remeron but wait on Tramadol-will start 15 mg at bedtime -will stop Restoril>> down to 7.5mg  -12/19/22 slept "wonderfully"! Cont regimen 10/28- Slept poorly- still on rmeron- sounds like every other night she slept well 10/30- sleeping better 10/31- stopped remeron- slept ok yesterday as well  01/02/23 not sleeping well per patient, but d/t bed; monitor for now 11/11- sleeping "as well as can".  5. Neuropsych/cognition: This patient is capable of making decisions on her own behalf.   6. Skin/Wound Care: Routine skin care checks   7. Fluids/Electrolytes/Nutrition: Routine Is and Os and follow-up chemistries  -01/02/23 BUN down to 27 this morning, Cr 0.92, monitor routinely   11/11- BUN down to 21 from 27 and Cr 1.00- doing better 8: Hypertension: monitor TID and prn (hx of a.fib see meds below). Monitor with mobility. Continue metoprolol 25mg  BID  Reduced Metoprolol per pt request 10/24- will add Norvasc 2.5 mg daily since BP elevated and not coming down  10/25- calling IM- maybe they can help- just added Norvasc yesterday -12/19/22 BPs improving a little this morning, monitor -12/20/22 BPs still a tad elevated but stable, monitor a couple more days before deciding on changes for now 10/28- BP on low side, but Hb low- will transfuse 2 units 10/29- BP looking more labile- but better than it was- monitor for now 11/1: continue current regimen -12/26/22 BPs variable, monitor trend for now 11/5- BP running 130s- 170's- Will increase Norvasc to 5 mg daily  11/7 BP stable, metoprolol discontinued by cardiology 11/8 BP above goal today.  If continues to be elevated cardiology recommends addition of ARB and would not recommend further titration of amlodipine. Will hold off on  change for now as would like to avoid hypotension -11/9-10/24 BPs a bit better, cont regimen  11/11- BP doing OK- intermittently in 140's- con't regimen  11/12- BP in 140s- but holding in low to mid 140s-  Vitals:   01/03/23 0334 01/03/23 1326 01/03/23 1933 01/04/23 0518  BP: (!) 145/55 (!) 147/57 (!) 151/70 (!) 150/69   01/04/23 1235 01/04/23 1335 01/04/23 1340 01/04/23 1350  BP: (!) 150/67 123/68 136/64 (!) 143/55   01/04/23 1400 01/04/23 1430 01/04/23 1954 01/05/23 0512  BP: (!) 144/67 132/77 (!) 147/61 (!) 148/59    9: Hyperlipidemia: continue pravastatin 40mg  daily   10: COPD, O2 dependent 2L via [place]:             -continue Brovana nebs BID             -continue Yupelri neb daily   10/21- Has been on 2L O2 at home per pt and chart  10/25- ABG shows CO2 too low- needs to reduce O2 levels  10/31- Sats ok- con't O2  11: CAD: continue asa and statin   12: Atrial fibrillation, paroxysmal: maintained on Eliquis reversed pre-operatively             -continue amiodarone 100 mg daily             -continue metoprolol tartrate 25 mg BID             -cleared by [facility] to re-start Eliquis>pharmacy consult placed   10/25- reduced Metoprolol to 12.5 mg BID earlier in week - doing better  10/30- BP doing better 11/8 cardiology does not feel she is currently in A-fib, they recommend continue amiodarone 100 mg daily and avoid nodal agents, continue Eliquis  11/11- held Eliquis due to  GI bleed- can restart 11/13 13: 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 10/22- not voiding -U/A (-)  will add Flomax 0.4 mg q supper to see if she can void- I don't think it's likely. D/w nursing due to low amount of caths-  -12/19/22 U/A overnight looks infectious, will start Bactrim DS BID x5d, UCx pending; monitor -12/20/22 UCx with >100k CFU of K. Pneumoniae, sensitive to bactrim, cont abx course 10/29- Being cathed- volumes low- pushing fluids, but will also give 1 L IVFs 10/30- not drinking- Got IVFs but BUN up to 45 and Cr 1.41- will call renal- and recheck in AM-- a little improved on 10/31 labs 11/5- drinking much better- volumes looking better- and Cr down to 1.16 and BUN 29 from 40's 11/11- Volumes on caths doing better 11/12- Cath volumes looking good 17: ABLA: stable; follow CBC trend             -consider oral iron supplement -12/13/22 Hgb slowly downtrending from postop 8.8 on 10/14>7.6>7.5>7.3>7.0 yesterday; pt asymptomatic at this time; would recheck tomorrow but if still at the 7.0-range then would consider transfusion to help with therapy tolerance; unclear what her prior baseline was (has hgb 12.2 back in July but then down to 9's).    10/21- Hb 7.4 today  10/23- Hb 7.2 yesterday- will check in AM 10/24- Hb down to 7.0- will d/w pt if she wants to be transfused. If so, will give 1 unit pRBCs.  Doesn't have location of losing blood- will check FOBT x3  10/25- Hb 8.1- likely too dry  10/28- Hb 6.7- will transfuse 2 units 10/29- Hb much better AND pt feels MUCH better per pt- Hb up to 9.9- likely so high since pt really dry- will also rewrite for FOBT- hasn't been done  10/30- no bowel results- so cannot check FOBT yet.  10/31- had 2 extra large Bms last night- didn't check FOBT_ will order again. Will d/w nursing 11/4: Hgb reviewed and is stable 11/5- Hb 9.2 11/8 HGB down to 8.7 yesterday, FOBT  postive, has had multiple dark stools recovered, will contact GI, continue PPI -GI recommends to hold Eliquis per note.  Discussed with Dr. Vivianne Master discontinue for now.  Eliquis stopped.  Appreciate GI/cardiology assistance -01/02/23 Hgb stable at 8.2 today, monitor -01/03/23 Hgb down to 8.0; transfuse to keep hgb >7 per GI notes; plan for EGD tomorrow; per notes, PPI IV but PO currently, asked GI and they said PO is fine. Will leave order in place.  11/11- Hb 8.4- will recheck in AM after EGD and see if going up at all- argon used for lesion- might also have esophageal candida- waiting on biopsy results-  -on PPI BID and Carafate QID for 2 weeks per GI- they signed off 11/12- looking at Surgical pathology- doesn' thave fungal  elements- so unlikely to be Fungus/thrush- Hb 8.5 18: AKI: improving; CMP 12/12/22 showing Cr 1.31; monitor Monday BMPs 10/21- Cr 1.09 down from 1.31 and BUN 25 down from 25- however is drinking poorly and has poor output- 10/23- Says drinking better- will check BMP in AM  10/25- Cr 0.9 and BUN slightly high at 22- but based on Hb increase, likely dry 10/28- Cr 1.4 and BUN elevated- will give blood and recheck BMP in AM 10/29- BUN up to 40 and Cr 1.41- will need to give IVFs- tried seeing if blood would help, but still needs fluids- will her BUN/Cr- will replete 75 cc/hour after therapy today and recheck BMP in AM- of note, her Cr was running ~ 1.0 and her BUN ~ 25- so big change- will push fluids as well- will give 75cc/hour for 1 liter  10/31- likely form Septra- will stop ABX-  11/4: Cr reviewed and is improving 11/5- Cr 1.16 and BUN 29 down from 40's 11/8 patient was given IV fluids yesterday for elevated BUN, recheck labs today -01/02/23 BUN down to 27, Cr 0.92, monitor 11/11- BUN 21 and Cr 1.0- doing better 19: neurogenic bowel: bowel program ordered -Dulcolax suppository ordered -12/13/22 per pt LBM yesterday, monitor 10/21- LBM this afternoon/AM- incontinent-  educated pt NEEDs bowel program since not having Bms at the time to avoid therapies- going when doesn't want to- this will train gut to go when we wants her to go.  10/22- continue to refuse bowel program-in spite of education of bowel program and need for it.  10/23- pt did last night- had BM with bowel program and one this AM, but educated can take 3-6 weeks to train gut, but should be better- had bowel incontinence for 20 years 10/24- no bowel program documented  but had small BM's at midnight and 5am- said wasn't incontinent?will check on this 10/25- asked pt to get more dig stim this evening since likely cause of bowel incontinent in AM -12/20/22 reports BM this morning but no BM since 12/17/22 documented, no report to nursing staff given; monitor closely but if no BM by tomorrow may need to consider additional adjustments 10/29- LBM yesterday AM, and small with bowel program 10/27-no results last night- if no BM tonight, will give sorbitol/check KUB  10/30- no significant BM for 3 days- will give Sorbitol 30cc at 2pm for bowel program- also add Senna 1 tab daily for meds- 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  -LBM 11/3, continue current regimen Reviewed chart and had BM 11/5 -01/03/23 no further BMs since 11/8, just mucus; see GI plan above 11/11- still having mucus- will order KUB 11/12- Small output last night- KUB shows normal gas and stool pattern- not full of stool- likely due to not eating much.  20. Poor Appetite 10/22- will add Megace for poor appetite since cannot add Remeron and too sedated to add Periactin. Will also order push fluids since not drinking great- although her BUN?Cr looking better- BUN 25 and Cr 1.08-  10/23- pt feels she's drinking 6+ cups/day- ate muffin and some eggs this AM- better than last 2 days-  10/24- 25% of tray at most- will add Remeron 15 mg at bedtime- ok'd by Cards.  10/25- changed remeron to 7.5 mg QHS 10/30- won't eat anything for  breakfast- small appetite overall-  10/31- ate 1 pancake this AM- better than normal 11/11- on full liquid diet today- can switch back to regular diet tomorrow 11/12- ate grits this  AM- put back on regular diet- Hb stable 21. Prolonged QT interval 10/24- spoke with Cards- they suggested starting Remeron 15 mg at bedtime and then rechecking EKG ina few days to see if can add Tramadol. 10/29- doing ok with remeron- wait to add tramadol for now    22. Oversedation/lethargy/ UTI 10/25- septis and ABG work up- when speaking with PA- appeared to have ST elevation- asked them to call IM consult-  -12/19/22 sleepy but arousable today, U/A+, started bactrim as above, awaiting UCx; CT head neg last night, CBC/BMP stable, TSH/T4 WNL, CXR stable; suspect UTI as source. Monitor. See above #16 11/6: alert and bright this morning 11/7 -Patient alert and oriented x 4, following commands.  Vital signs stable.  Will recheck lab work and UA culture.  Overall she appears stable this morning although fatigue may be waxing and waning. 11/8 patient was restarted on Bactrim last night for suspected UTI, monitor renal function  11/12- BUN/Cr looking OK off bactrim 23. L radial pulse nonpalpable but dopplerable, BPs different in that arm-- unclear when this started, had some pain in L deltoid area yesterday but seemed to be better today. Suspect this pulse/BP discrepancy is 2/2 her vascular issue already, but reached out to vascular surgery to verify. Dr. Karin Lieu returned page, stated this is known since her surgery; nothing to do or be concerned about.   24. Pulsatile abdominal aorta: not noticed previously, not mentioned in notes, but today 12/20/22 I noticed it was very prominently pulsatile and pt reported it being sore in that area; spoke with Dr. Karin Lieu again of [facility], recommended getting CTA Abd/pelv to evaluate for AAA. 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 Bradycardia -11/7 patient is being followed by cardiology.  Metoprolol has been stopped and ASA duration was recommended-ASA has been stopped.  -11/8 heart rate doing better today  11/12- HR doing much better- in 80s now  I spent a total of  48  minutes on total care today- >50% coordination of care- due to  [facility] over all medical issues- d/w nursing about diet change; went over KUB independently- and went over vitals, Cath volumes, bowel program notes and team conference today  LOS: 24 days A FACE TO FACE EVALUATION WAS PERFORMED  Reuben Knoblock 01/05/2023, 9:18 AM

## 2023-01-05 NOTE — Progress Notes (Signed)
Occupational Therapy Session Note  Patient Details  Name: Heather Molina MRN: [id number] Date of Birth: 1941-01-18  Today's Date: 01/05/2023 OT Individual Time: 0915-0950 & 1405-1500 OT Individual Time Calculation (min): 35 min & 55 min   Short Term Goals: Week 4:  OT Short Term Goal 1 (Week 4): STG=LTG d/t ELOS  Skilled Therapeutic Interventions/Progress Updates:      Therapy Documentation Precautions:  Precautions Precautions: Fall Precaution Comments: paraplegia Restrictions Weight Bearing Restrictions: No Session 1 General: "I feel good today." Pt supine in bed upon OT arrival, agreeable to OT session.  Pain: no pain reported  Other Treatments: Pt and OT discussed use of mobility devices once D/C. Pt had PWC consult on 11/5 and since then has been questioning whether PWC is good fit for her and caregivers. Pt and OT discussed benefits of PWC and functions with increased independence. OT educated pt on other W/C options that may be better fit for her and family. OT educated pt on importance of completing pressure relief with each W/C type and how she would have to complete it.  Pt supine in bed with bed alarm activated, 2 bed rails up, call light within reach and 4Ps assessed.  Session 2 (family Ed) General: "I feel okay about everything" Pt supine in bed upon OT arrival, agreeable to OT session. Daughter tracy present  Pain: no pain reported   Other Treatments: Patient, pt daughter,and [place] Dec OT present at family training. Pt and care partner educated on adaptive equipment, current functional level of assistance for ADLs and functional mobility/transfers. Pt educated specifically on CARE tool levels of assistance provided at current status and level of assistance for OT goals once at D/C. Pt daughter showed OT pt room set up with ramp being built and electric recliner for pt use. Pt and daughter educated on B&B and why I/O cathing is necessary and pressure relief strategies for  W/C and bed. Pt daughter and OT discussed roll in shower chairs for home use. Pt and daughter feel comfortable with D/C next week.   Pt supine in bed with bed alarm activated, 2 bed rails up, call light within reach and 4Ps assessed.   Therapy/Group: Individual Therapy  [name], OTD, OTR/L 01/05/2023, 4:40 PM

## 2023-01-05 NOTE — Progress Notes (Addendum)
Patient ID: Heather Molina, female   DOB: 08/16/1940, 82 y.o.   MRN: [id number]  Met with pt and tracy-daughter who is here for education today. Gave team conference update regarding progress toward her goals and still having the family meeting on Thursday. Pt feeling better since endo and found the ulcer and fixed it. [name] and pt to discuss with PT the power chair versus manuel chair for home.   3;41 PM Referral made to [employer] for catheters. Daughter and pt reports it went well today

## 2023-01-05 NOTE — Progress Notes (Signed)
IP Rehab Bowel Program Documentation   Bowel Program Start time 2215  Dig Stim Indicated? Yes  Dig Stim Prior to Suppository or mini Enema X 2  Output from dig stim: Small  Ordered intervention: Suppository Yes , mini enema Yes ,   Repeat dig stim after Suppository or Mini enema  X 2,  Output? Small   Bowel Program Complete? Yes ,   Patient Tolerated? Yes

## 2023-01-06 ENCOUNTER — Encounter (HOSPITAL_COMMUNITY): Payer: Self-pay | Admitting: Gastroenterology

## 2023-01-06 LAB — HEMOGLOBIN AND HEMATOCRIT, BLOOD
HCT: 26.8 % — ABNORMAL LOW (ref 36.0–46.0)
Hemoglobin: 8.7 g/dL — ABNORMAL LOW (ref 12.0–15.0)

## 2023-01-06 MED ORDER — SORBITOL 70 % SOLN
30.0000 mL | Freq: Once | Status: AC
  Administered 2023-01-06: 30 mL via ORAL
  Filled 2023-01-06: qty 30

## 2023-01-06 MED ORDER — APIXABAN 2.5 MG PO TABS
2.5000 mg | ORAL_TABLET | Freq: Two times a day (BID) | ORAL | Status: DC
  Administered 2023-01-06 – 2023-01-12 (×13): 2.5 mg via ORAL
  Filled 2023-01-06 (×12): qty 1

## 2023-01-06 NOTE — Progress Notes (Signed)
Physical Therapy Session Note  Patient Details  Name: Heather Molina MRN: [id number] Date of Birth: 1940-10-02  Today's Date: 01/06/2023 PT Individual Time: 1315-1415 PT Individual Time Calculation (min): 60 min  and Today's Date: 01/06/2023 PT Missed Time: 15 Minutes Missed Time Reason: Other (Comment) (eating meal)  Short Term Goals: Week 3:  PT Short Term Goal 1 (Week 3): STG's = LTG's due to ELOS  Skilled Therapeutic Interventions/Progress Updates:      Therapy Documentation Precautions:  Precautions Precautions: Fall Precaution Comments: paraplegia Restrictions Weight Bearing Restrictions: No  Pt agreeable to PT session with emphasis on transfer training and dynamic sitting balance from supported w/c. Pt without reports of pain in session. Pt missed 15 minutes of skilled PT due to eating lunch, plan to make up missed minutes as able.   Pt min A for supine > sit and with lateral slide board transfer to [facility]. Pt CGA with PWC mobility and requires mod cues for obstacle negotiation and to adjust speed to safely navigate through environment.   From a posterior tilted position in PWC pt performed front and bilateral contralateral reaching for cones placed outside base of support with increased time and encouragement for participation.   Supervision for PWC mobility and set-up adjacent to bed for transfer (min A). Pt left semi-reclined in bed with all needs in reach.   Therapy/Group: Individual Therapy  Truitt Leep Truitt Leep PT, DPT  01/06/2023, 1:44 PM

## 2023-01-06 NOTE — Plan of Care (Signed)
  Problem: RH Balance Goal: LTG Patient will maintain dynamic sitting balance (PT) Description: LTG:  Patient will maintain dynamic sitting balance with assistance during mobility activities (PT) Flowsheets (Taken 01/06/2023 1330) LTG: Pt will maintain dynamic sitting balance during mobility activities with:: Minimal Assistance - Patient > 75% Note: Downgraded due to weakness, balance impairments and lack of progression with mobility    Problem: RH Wheelchair Mobility Goal: LTG Patient will propel w/c in home environment (PT) Description: LTG: Patient will propel wheelchair in home environment, # of feet with assistance (PT). Outcome: Not Applicable Note: Discontinued as pt plan for dependent w/c mobility by caregiver  Goal: LTG Patient will propel w/c in community environment (PT) Description: LTG: Patient will propel wheelchair in community environment, # of feet with assist (PT) Outcome: Not Applicable Note: Discontinued as pt plan for dependent w/c mobility by caregiver

## 2023-01-06 NOTE — Progress Notes (Signed)
PROGRESS NOTE   Subjective/Complaints:  Pt ate eggs and toast- this AM-  Had no BM last night   ROS:    Pt denies SOB, abd pain, CP, N/V/C/D, and vision changes    + Fatigue-she feels tired this morning-ongoing   Except for HPI  Objective:   DG Abd 1 View  Result Date: 01/04/2023 CLINICAL DATA:  [id number] Abdominal pain [id number] EXAM: ABDOMEN - 1 VIEW COMPARISON:  CT abdomen pelvis 12/20/2022 FINDINGS: Right mid abdomen surgical clips. The bowel gas pattern is normal. No radio-opaque calculi or other significant radiographic abnormality are seen. Partially visualized thoracic aorta stent graft. IMPRESSION: Nonobstructive bowel gas pattern. Electronically Signed   By: Tish Frederickson M.D.   On: 01/04/2023 23:05   Recent Labs    01/04/23 0452 01/05/23 0508 01/06/23 0523  WBC 6.6  --   --   HGB 8.4* 8.5* 8.7*  HCT 25.6* 26.3* 26.8*  PLT 277  --   --      Recent Labs    01/04/23 0452  NA 134*  K 4.8  CL 105  CO2 23  GLUCOSE 92  BUN 21  CREATININE 1.00  CALCIUM 8.2*        Intake/Output Summary (Last 24 hours) at 01/06/2023 0815 Last data filed at 01/06/2023 1324 Gross per 24 hour  Intake 957 ml  Output 2200 ml  Net -1243 ml     Pressure Injury 12/12/22 Sacrum Medial Deep Tissue Pressure Injury - Purple or maroon localized area of discolored intact skin or blood-filled blister due to damage of underlying soft tissue from pressure and/or shear. (Active)  12/12/22 1844  Location: Sacrum  Location Orientation: Medial  Staging: Deep Tissue Pressure Injury - Purple or maroon localized area of discolored intact skin or blood-filled blister due to damage of underlying soft tissue from pressure and/or shear.  Wound Description (Comments):   Present on Admission: Yes     Pressure Injury 12/14/22 Heel Left;Lateral;Posterior Stage 2 -  Partial thickness loss of dermis presenting as a shallow open injury with a  red, pink wound bed without slough. (Active)  12/14/22 (first assessed by [facility]) 1221  Location: Heel  Location Orientation: Left;Lateral;Posterior  Staging: Stage 2 -  Partial thickness loss of dermis presenting as a shallow open injury with a red, pink wound bed without slough.  Wound Description (Comments):   Present on Admission: No     Pressure Injury 12/14/22 Heel Right;Lateral Stage 2 -  Partial thickness loss of dermis presenting as a shallow open injury with a red, pink wound bed without slough. (Active)  12/14/22 (first assessed by [facility]) 1221  Location: Heel  Location Orientation: Right;Lateral  Staging: Stage 2 -  Partial thickness loss of dermis presenting as a shallow open injury with a red, pink wound bed without slough.  Wound Description (Comments):   Present on Admission: No     Pressure Injury 12/14/22 Ischial tuberosity Left;Medial Stage 2 -  Partial thickness loss of dermis presenting as a shallow open injury with a red, pink wound bed without slough. blister (Active)  12/14/22 (first assessed by [facility] nurse) 1221  Location: Ischial tuberosity  Location Orientation: Left;Medial  Staging:  Stage 2 -  Partial thickness loss of dermis presenting as a shallow open injury with a red, pink wound bed without slough.  Wound Description (Comments): blister  Present on Admission: No     Physical Exam: Vital Signs Blood pressure (!) 141/64, pulse 64, temperature 98.4 F (36.9 C), temperature source Oral, resp. rate 16, height 5' (1.524 m), weight 50.5 kg, SpO2 97%.        General: awake, alert, appropriate, Sitting up in bed- helped NT to get pt  up in bed; NAD HENT: conjugate gaze; oropharynx moist- O2 in place CV: regular rate; no JVD Pulmonary: CTA B/L; no W/R/R- good air movement GI: soft, NT, ND, (+)BS Psychiatric: appropriate- brighter affect Neurological: Ox3   Moving bilateral upper extremities to gravity and resistance.   Sensation intact light touch in all  4 extremities.    Prior exams MS; was able to move RLE< but from abd muscles- not in LE muscles- no movement seen in LLE either-  Skin: Groin incision site looks okay, bruising noted bilateral shins 2 IVs in place left upper extremity looks okay Extremities: unable to palpate L radial pulse but dopplers confirm pulse with rate in 60-70s, arm well perfused appearing, warm and pink, motor function preserved. Brachial pulse also unable to be felt. R radial pulse strong and palpable.    Neuro:   Mental Status: AAOx3, memory intact,  Speech/Languate:  fluent, follows simple commands CRANIAL NERVES: II: PERRL. Visual fields full III, IV, VI: EOM intact, no gaze preference or deviation V: normal sensation bilaterally VII: no asymmetry VIII: normal hearing to speech IX, X: normal palatal elevation XI: 5/5 head turn and 5/5 shoulder shrug bilaterally XII: Tongue midline     MOTOR: RUE: 5/5 Deltoid, 5/5 Biceps, 5/5 Triceps,5/5 Grip LUE: 5/5 Deltoid, 5/5 Biceps, 5/5 Triceps, 5/5 Grip RLE: 0 out of 5 throughout LLE: 0 out of 5 throughout, exam stable 11/6   SENSORY: Normal to touch all 4 extremities to light touch in bilateral upper and lower extremities, patchy altered sensation to cold in lower extremities   No ankle clonus bilaterally   Coordination: Normal finger to nose, no dysmetria  Assessment/Plan: 1. Functional deficits which require 3+ hours per day of interdisciplinary therapy in a comprehensive inpatient rehab setting. Physiatrist is providing close team supervision and 24 hour management of active medical problems listed below. Physiatrist and rehab team continue to assess barriers to discharge/monitor patient progress toward functional and medical goals  Care Tool:  Bathing    Body parts bathed by patient: Right arm, Left arm, Chest, Abdomen, Front perineal area, Face, Right upper leg, Left upper leg   Body parts bathed by helper: Right lower leg, Left lower leg,  Buttocks     Bathing assist Assist Level: Minimal Assistance - Patient > 75% (roll in shower chair)     Upper Body Dressing/Undressing Upper body dressing   What is the patient wearing?: Pull over shirt    Upper body assist Assist Level: Supervision/Verbal cueing    Lower Body Dressing/Undressing Lower body dressing      What is the patient wearing?: Incontinence brief, Pants     Lower body assist Assist for lower body dressing: Maximal Assistance - Patient 25 - 49%     Toileting Toileting    Toileting assist Assist for toileting: Total Assistance - Patient < 25%     Transfers Chair/bed transfer  Transfers assist  Chair/bed transfer activity did not occur: Safety/medical concerns  Chair/bed transfer assist level:  Moderate Assistance - Patient 50 - 74%     Locomotion Ambulation   Ambulation assist   Ambulation activity did not occur: Safety/medical concerns (paraplegia, fatigue)          Walk 10 feet activity   Assist  Walk 10 feet activity did not occur: Safety/medical concerns (paraplegia, fatigue)        Walk 50 feet activity   Assist Walk 50 feet with 2 turns activity did not occur: Safety/medical concerns (paraplegia, fatigue)         Walk 150 feet activity   Assist Walk 150 feet activity did not occur: Safety/medical concerns (paraplegia, fatigue)         Walk 10 feet on uneven surface  activity   Assist Walk 10 feet on uneven surfaces activity did not occur: Safety/medical concerns (paraplegia, fatigue)         Wheelchair     Assist Is the patient using a wheelchair?: Yes Type of Wheelchair: Power Wheelchair activity did not occur: Safety/medical concerns (paraplegia, fatigue)  Wheelchair assist level: Contact Guard/Touching assist Max wheelchair distance: 200    Wheelchair 50 feet with 2 turns activity    Assist    Wheelchair 50 feet with 2 turns activity did not occur: Safety/medical concerns  (paraplegia, fatigue)   Assist Level: Contact Guard/Touching assist   Wheelchair 150 feet activity     Assist  Wheelchair 150 feet activity did not occur: Safety/medical concerns (paraplegia, fatigue)   Assist Level: Contact Guard/Touching assist   Blood pressure (!) 141/64, pulse 64, temperature 98.4 F (36.9 C), temperature source Oral, resp. rate 16, height 5' (1.524 m), weight 50.5 kg, SpO2 97%.  Medical Problem List and Plan: 1. 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  Antithrombotics: -DVT/anticoagulation:  Pharmaceutical: Eliquis 2.5mg  BID-- on hold for EGD 01/04/23 11/11- can be restarted in 2 days at 2.5 mg BID per GI-  11/13- restarted Elqiuis -antiplatelet therapy: Aspirin 81 mg daily- discontinued per cardiology recommendation    3. Pain Management: continue Tylenol, robaxin, oxycodone as needed 10/24- will wait to try Tramadol for pain/discomfort til have tried  Remeron 7.5mg  for sleep 10/31- stopped Remeron 4. Mood/Behavior/Sleep: LCSW to evaluate and provide emotional support             -continue paroxetine 20 mg daily (hx of depression)             -antipsychotic agents: n/a -12/13/22 hasn't been sleeping well, increase melatonin to 10mg  QHS, added Trazodone 25mg  PRN but advised to be cautious about using it too late.  10/21- will stop Trazodone- can impair ability to pee- will start Restoril 7.5 mg at bedtime for sleep.  10/22- cannot start Remeron due to risk of torsades de pointes- will con't Restoril  10/23- didn't sleep well again- will increase Restoril to 15 mg at bedtime 10/24- spoke to Cards- they are ok trying  Remeron but wait on Tramadol-will start 15 mg at bedtime -will stop Restoril>> down to 7.5mg  -12/19/22 slept "wonderfully"! Cont regimen  10/28- Slept poorly- still on rmeron- sounds like every other night she slept well 10/30- sleeping better 10/31- stopped remeron- slept ok yesterday as well  01/02/23 not sleeping well per patient, but d/t bed; monitor for now 11/11- sleeping "as well as can".  5. Neuropsych/cognition: This patient is capable of making decisions on her own behalf.   6. Skin/Wound Care: Routine skin care checks   7. Fluids/Electrolytes/Nutrition: Routine Is and Os and follow-up chemistries  -01/02/23 BUN down to 27 this morning, Cr 0.92, monitor routinely   11/11- BUN down to 21 from 27 and Cr 1.00- doing better 8: Hypertension: monitor TID and prn (hx of a.fib see meds below). Monitor with mobility. Continue metoprolol 25mg  BID  Reduced Metoprolol per pt request 10/24- will add Norvasc 2.5 mg daily since BP elevated and not coming down  10/25- calling IM- maybe they can help- just added Norvasc yesterday -12/19/22 BPs improving a little this morning, monitor -12/20/22 BPs still a tad elevated but stable, monitor a couple more days before deciding on changes for now 10/28- BP on low side, but Hb low- will transfuse 2 units 10/29- BP looking more labile- but better than it was- monitor for now 11/1: continue current regimen -12/26/22 BPs variable, monitor trend for now 11/5- BP running 130s- 170's- Will increase Norvasc to 5 mg daily  11/7 BP stable, metoprolol discontinued by cardiology 11/8 BP above goal today.  If continues to be elevated cardiology recommends addition of ARB and would not recommend further titration of amlodipine. Will hold off on change for now as would like to avoid hypotension -11/9-10/24 BPs a bit better, cont regimen  11/11- BP doing OK- intermittently in 140's- con't regimen  11/12-11/13 BP in 140s- but holding in low to mid 140s-  Vitals:    01/04/23 0518 01/04/23 1235 01/04/23 1335 01/04/23 1340  BP: (!) 150/69 (!) 150/67 123/68 136/64   01/04/23 1350 01/04/23 1400 01/04/23 1430 01/04/23 1954  BP: (!) 143/55 (!) 144/67 132/77 (!) 147/61   01/05/23 0512 01/05/23 1513 01/05/23 1939 01/06/23 0435  BP: (!) 148/59 (!) 153/62 (!) 158/54 (!) 141/64    9: Hyperlipidemia: continue pravastatin 40mg  daily   10: COPD, O2 dependent 2L via [place]:             -continue Brovana nebs BID             -continue Yupelri neb daily   10/21- Has been on 2L O2 at home per pt and chart  10/25- ABG shows CO2 too low- needs to reduce O2 levels  10/31- Sats ok- con't O2  11: CAD: continue asa and statin   12: Atrial fibrillation, paroxysmal: maintained on Eliquis reversed pre-operatively             -continue amiodarone 100 mg daily             -continue metoprolol tartrate 25 mg BID             -cleared by [facility] to re-start Eliquis>pharmacy consult placed   10/25- reduced Metoprolol to 12.5 mg BID earlier in week - doing better  10/30- BP doing better 11/8 cardiology does not feel she is currently in A-fib, they recommend continue amiodarone 100 mg daily and avoid nodal agents, continue Eliquis  11/11- held Eliquis due to GI bleed- can restart 11/13  11/13- will restart Eliquis 2.5 mg BID 13: 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 10/22- not voiding -U/A (-)   will add Flomax 0.4 mg q supper to see if she can void- I don't think it's likely. D/w nursing due to low amount of caths-  -12/19/22 U/A overnight looks infectious, will start Bactrim DS BID x5d, UCx pending; monitor -12/20/22 UCx with >100k CFU of K. Pneumoniae, sensitive to bactrim, cont abx course 10/29- Being cathed- volumes low- pushing fluids, but will also give 1 L IVFs 10/30- not drinking- Got IVFs but BUN up to 45 and Cr 1.41- will call renal- and recheck in AM-- a little improved on 10/31 labs 11/5- drinking much better- volumes looking better- and Cr down to 1.16 and BUN 29 from 40's 11/11- Volumes on caths doing better 11/12- Cath volumes looking good 17: ABLA: stable; follow CBC trend             -consider oral iron supplement -12/13/22 Hgb slowly downtrending from postop 8.8 on 10/14>7.6>7.5>7.3>7.0 yesterday; pt asymptomatic at this time; would recheck tomorrow but if still at the 7.0-range then would consider transfusion to help with therapy tolerance; unclear what her prior baseline was (has hgb 12.2 back in July but then down to 9's).    10/21- Hb 7.4 today  10/23- Hb 7.2 yesterday- will check in AM 10/24- Hb down to 7.0- will d/w pt if she wants to be transfused. If so, will give 1 unit pRBCs.  Doesn't have location of losing blood- will check FOBT x3  10/25- Hb 8.1- likely too dry  10/28- Hb 6.7- will transfuse 2 units 10/29- Hb much better AND pt feels MUCH better per pt- Hb up to 9.9- likely so high since pt really dry- will also rewrite for FOBT- hasn't been done  10/30- no bowel results- so cannot check FOBT yet.  10/31- had 2 extra large Bms last night- didn't check FOBT_ will order again. Will d/w nursing 11/4: Hgb reviewed and is stable 11/5- Hb 9.2 11/8 HGB down to 8.7 yesterday, FOBT postive, has had multiple dark stools recovered, will contact GI, continue PPI -GI recommends to hold Eliquis per note.  Discussed with Dr. Vivianne Master discontinue for now.  Eliquis  stopped.  Appreciate GI/cardiology assistance -01/02/23 Hgb stable at 8.2 today, monitor -01/03/23 Hgb down to 8.0; transfuse to keep hgb >7 per GI notes; plan for EGD tomorrow; per notes, PPI IV but PO currently, asked GI and they said PO is fine. Will leave order in place.  11/11- Hb 8.4- will recheck in AM after EGD and see if going up at all- argon used for lesion- might also have esophageal candida- waiting on biopsy results-  -on PPI BID and Carafate QID for 2 weeks per GI- they signed off 11/12- looking at Surgical pathology- doesn' thave fungal elements- so unlikely to be Fungus/thrush- Hb 8.5 11.13- no Thrush/candidada esophagitis per GI 18: AKI: improving; CMP 12/12/22 showing Cr 1.31; monitor Monday BMPs 10/21-  Cr 1.09 down from 1.31 and BUN 25 down from 25- however is drinking poorly and has poor output- 10/23- Says drinking better- will check BMP in AM  10/25- Cr 0.9 and BUN slightly high at 22- but based on Hb increase, likely dry 10/28- Cr 1.4 and BUN elevated- will give blood and recheck BMP in AM 10/29- BUN up to 40 and Cr 1.41- will need to give IVFs- tried seeing if blood would help, but still needs fluids- will her BUN/Cr- will replete 75 cc/hour after therapy today and recheck BMP in AM- of note, her Cr was running ~ 1.0 and her BUN ~ 25- so big change- will push fluids as well- will give 75cc/hour for 1 liter  10/31- likely form Septra- will stop ABX-  11/4: Cr reviewed and is improving 11/5- Cr 1.16 and BUN 29 down from 40's 11/8 patient was given IV fluids yesterday for elevated BUN, recheck labs today -01/02/23 BUN down to 27, Cr 0.92, monitor 11/11- BUN 21 and Cr 1.0- doing better 19: neurogenic bowel: bowel program ordered -Dulcolax suppository ordered -12/13/22 per pt LBM yesterday, monitor 10/21- LBM this afternoon/AM- incontinent- educated pt NEEDs bowel program since not having Bms at the time to avoid therapies- going when doesn't want to- this will train gut to go  when we wants her to go.  10/22- continue to refuse bowel program-in spite of education of bowel program and need for it.  10/23- pt did last night- had BM with bowel program and one this AM, but educated can take 3-6 weeks to train gut, but should be better- had bowel incontinence for 20 years 10/24- no bowel program documented  but had small BM's at midnight and 5am- said wasn't incontinent?will check on this 10/25- asked pt to get more dig stim this evening since likely cause of bowel incontinent in AM -12/20/22 reports BM this morning but no BM since 12/17/22 documented, no report to nursing staff given; monitor closely but if no BM by tomorrow may need to consider additional adjustments 10/29- LBM yesterday AM, and small with bowel program 10/27-no results last night- if no BM tonight, will give sorbitol/check KUB  10/30- no significant BM for 3 days- will give Sorbitol 30cc at 2pm for bowel program- also add Senna 1 tab daily for meds- 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  -LBM 11/3, continue current regimen Reviewed chart and had BM 11/5 -01/03/23 no further BMs since 11/8, just mucus; see GI plan above 11/11- still having mucus- will order KUB 11/12- Small output last night- KUB shows normal gas and stool pattern- not full of stool- likely due to not eating much.  11/13- no BM last night- will order Sorbitol 30cc 1400 to help her have BM- abd appears flat/not distended- soft 20. Poor Appetite 10/22- will add Megace for poor appetite since cannot add Remeron and too sedated to add Periactin. Will also order push fluids since not drinking great- although her BUN?Cr looking better- BUN 25 and Cr 1.08-  10/23- pt feels she's drinking 6+ cups/day- ate muffin and some eggs this AM- better than last 2 days-  10/24- 25% of tray at most- will add Remeron 15 mg at bedtime- ok'd by Cards.  10/25- changed remeron to 7.5 mg QHS 10/30- won't eat anything for breakfast- small appetite  overall-  10/31- ate 1 pancake this AM- better than normal 11/11- on full liquid diet today- can switch back to regular diet tomorrow 11/12- ate grits this  AM- put back on regular diet- Hb stable 11/13- ate all eggs and toast 21. Prolonged QT interval 10/24- spoke with Cards- they suggested starting Remeron 15 mg at bedtime and then rechecking EKG ina few days to see if can add Tramadol. 10/29- doing ok with remeron- wait to add tramadol for now    22. Oversedation/lethargy/ UTI 10/25- septis and ABG work up- when speaking with PA- appeared to have ST elevation- asked them to call IM consult-  -12/19/22 sleepy but arousable today, U/A+, started bactrim as above, awaiting UCx; CT head neg last night, CBC/BMP stable, TSH/T4 WNL, CXR stable; suspect UTI as source. Monitor. See above #16 11/6: alert and bright this morning 11/7 -Patient alert and oriented x 4, following commands.  Vital signs stable.  Will recheck lab work and UA culture.  Overall she appears stable this morning although fatigue may be waxing and waning. 11/8 patient was restarted on Bactrim last night for suspected UTI, monitor renal function  11/12- BUN/Cr looking OK off bactrim 23. L radial pulse nonpalpable but dopplerable, BPs different in that arm-- unclear when this started, had some pain in L deltoid area yesterday but seemed to be better today. Suspect this pulse/BP discrepancy is 2/2 her vascular issue already, but reached out to vascular surgery to verify. Dr. Karin Lieu returned page, stated this is known since her surgery; nothing to do or be concerned about.   24. Pulsatile abdominal aorta: not noticed previously, not mentioned in notes, but today 12/20/22 I noticed it was very prominently pulsatile and pt reported it being sore in that area; spoke with Dr. Karin Lieu again of [facility], recommended getting CTA Abd/pelv to evaluate for AAA. 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 Bradycardia -11/7 patient is being followed by cardiology.  Metoprolol has been stopped and ASA duration was recommended-ASA has been stopped.  -11/8 heart rate doing better today  11/12- HR doing much better- in 80s now    LOS: 25 days A FACE TO FACE EVALUATION WAS PERFORMED  Heather Molina 01/06/2023, 8:15 AM

## 2023-01-06 NOTE — Progress Notes (Signed)
Nutrition Follow-up  DOCUMENTATION CODES:   Not applicable  INTERVENTION:   -Continue regular diet, please encourage intakes.  -Provide Mighty Shake BID with meals, each supplement provides 330 kcals and 9 grams of protein; discontinue Magic Cup TID per patient preference.  -Continue Ensure Enlive po daily, each supplement provides 350 kcal and 20 grams of protein.     NUTRITION DIAGNOSIS:   Increased nutrient needs related to wound healing as evidenced by estimated needs-ongoing -addressing with meals and oral supplements    GOAL:   Patient will meet greater than or equal to 90% of their needs -progressing   MONITOR:   PO intake, Supplement acceptance, Labs, Weight trends, Skin  REASON FOR ASSESSMENT:   Follow up  ASSESSMENT:   82 y/o female presented to [facility] on 12/06/22 with complaint of acute chest and back pain. 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  Transferred to [facility]. 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 She was taken to surgery and underwent TEVAR, transferred to ICU for observation. Postop day 1 she was unable to move her feet.   PMH: acute on chronic respiratory failure with hypoxia, osteoporosis, anxiety, COPD, CAD, CAP, CVA, nicotine dependence, GERD, generalized edema, HTN, IBS with diarrhea, dyslipidemia, A-fib, current major depressive disorder, cholecystectomy, endarterectomy-femoral, thoracic aortic endovascular stent graft.  10/14-s/p TEVAR with spinal cord ischemia  11/11-EGD, superficial esophageal ulcers and a single superficial cameron ulcer, single bleeding lesion in duodenum. Esophageal biopsies showed no evidence of candidiasis  Intakes recorded average 58% x 8 meals.  Patient consumed scrambled eggs with sausage at B, BLT at L and was able to eat most of the meals and tolerated. States appetite is improving. She does not care  for the Magic Cups and is willing to try alternative, she is drinking the Ensure daily. Daughter at bedside is encouraging her to eat. Weight from 11/6 stable from admit.   Medications reviewed and include vitamin C 250 mg daily, megace, MVI/Minerals-1 Tab daily, PPI, Senokot, sodium chloride 1 gm daily, carafate.  Labs reviewed    Diet Order:   Diet Order             Diet regular Room service appropriate? Yes; Fluid consistency: Thin  Diet effective now                   EDUCATION NEEDS:   Education needs have been addressed  Skin:  Skin Assessment: Skin Integrity Issues: Skin Integrity Issues:: DTI, Stage II, Incisions, Other (Comment) DTI: medial sacrum Stage II: left lateral posterior heel, right lateral heel, ischial tuberosity Incisions: right groin Other: wound to left lateral anterior arm  Last BM:  01/03/23  Height:   Ht Readings from Last 1 Encounters:  12/12/22 5' (1.524 m)    Weight:   Wt Readings from Last 1 Encounters:  12/30/22 50.5 kg    Ideal Body Weight:  45.3 kg (adjusted IBW by 5% due to paraplegia)  BMI:  Body mass index is 21.74 kg/m.  Estimated Nutritional Needs:   Kcal:  1400-1600  Protein:  70-80g  Fluid:  >/=1.5L    Alvino Chapel, RDLD Clinical Dietitian See AMION for contact information

## 2023-01-06 NOTE — Progress Notes (Signed)
IP Rehab Bowel Program Documentation   Bowel Program Start time 2035  Dig Stim Indicated? Yes  Dig Stim Prior to Suppository or mini Enema X 2   Output from dig stim: Small  Ordered intervention: Suppository Yes , mini enema No ,   Repeat dig stim after Suppository or Mini enema  X 3,  Output? Small   Bowel Program Complete? Yes , handoff give  Patient Tolerated? Yes

## 2023-01-06 NOTE — Progress Notes (Signed)
Physical Therapy Session Note  Patient Details  Name: Heather Molina MRN: [id number] Date of Birth: [birth year]  Today's Date: 01/06/2023 PT Individual Time: 0908-0950 PT Individual Time Calculation (min): 42 min   Short Term Goals: Week 3:  PT Short Term Goal 1 (Week 3): STG's = LTG's due to ELOS  Skilled Therapeutic Interventions/Progress Updates: Patient supine in bed on entrance to room. Patient alert and agreeable to PT session.   Patient reported no pain or other issues at beginning of session. Pt further educated on importance of pressure relief and why the PWC is important in that aspect.   Therapeutic Activity: Bed Mobility: PTA donned B knee high TED hose dependently while pt supine in bed with + 2 from rehab tech. PTA threaded personal pants up to mid thigh with modA to donn around waist for time (pt rolled to R and L with CGA and use of HOB railing). Pt performed supine<sit on EOB with heavy min/modA. VC required for pt to use Log roll technique (pt initially attempted to pull self up via hand railing). Pt VC to use R elbow and L hand to push into bed to elevate trunk. Transfers: Pt performed sliding board transfer from EOB to PWC to the L with minA to anteriorly scoot and slightly to the L to angle hips towards seat, and minA to scoot across sliding board (PTA set up board underneath pt hips). Pt with increased time to scoot, and totalA to adjust B LE's onto foot plating on PWC. Pt required adjustment in room to face front of room and was cued to use PWC control stick to maneuver in room but denied wanting to do so. PTA totalA to adjust PWC in room at end of session with pt knowing how to tilt seat back on buttons without VC.   Neuromuscular Re-ed: - Static sitting balance on EOB (air mattress) with modA throughout. Pt cued to maintain sitting balance for 2 min with VC to keep B UE's on lap, and to adjust trunk with core musculature as able. Pt required use of B UE's for more than 75%  of task (hands on bed to adjust self accordingly). Pt then cued to laterally lean onto elbows on R and L side, and to bring trunk back to center with heavy modA and use of B UE's for support in pulling self up.   NMR performed for improvements in motor control and coordination, balance, sequencing, judgement, and self confidence/ efficacy in performing all aspects of mobility at highest level of independence.   Patient sitting in PWC at end of session with brakes locked, belt alarm set, and all needs within reach.      Therapy Documentation Precautions:  Precautions Precautions: Fall Precaution Comments: paraplegia Restrictions Weight Bearing Restrictions: No  Therapy/Group: Individual Therapy  Findley Blankenbaker PTA 01/06/2023, 12:10 PM

## 2023-01-06 NOTE — Progress Notes (Signed)
Heather Molina,  The biopsies of your esophagus did not show any evidence of candidiasis.  Based on the remnant food in your esophagus, the ulcers could have been caused by medications (pill esophagitis).  Given the absence of symptoms, I don't think any additional follow up is needed.

## 2023-01-06 NOTE — Progress Notes (Signed)
Occupational Therapy Session Note  Patient Details  Name: Heather Molina MRN: [id number] Date of Birth: 30-Aug-1940  Today's Date: 01/06/2023 OT Individual Time: 1000-1100 OT Individual Time Calculation (min): 60 min    Short Term Goals: Week 4:  OT Short Term Goal 1 (Week 4): STG=LTG d/t ELOS  Skilled Therapeutic Interventions/Progress Updates:      Therapy Documentation Precautions:  Precautions Precautions: Fall Precaution Comments: paraplegia Restrictions Weight Bearing Restrictions: No General: "I want to work on my sitting balance" Pt seated in W/C upon OT arrival, agreeable to OT.  Pain: no pain reported  ADL: Grooming/oral hygiene: SBA seated at sink, able to pull up on W/C arm rests to reach items at back of sink  Transfer: Min A PWC>bed, OT placing transfer board Bed mobility: Mod A to manage LE into bed   Balance: Pt completed various exercises seated in W/C in order to challenge balance and trunk control. Pt completed exercises at SBA-Min A to correct balance within W/C. Exercises listed below and completing multiple trials: -anterior lean out of BOS to retrieve towels in front of her, one UE support on W/C arm rest -folding towels with BUE -seated shoulder raises with small weighted medicine ball  -horizontal shoulder abd    Pt seated in W/C at end of session with W/C alarm donned, call light within reach and 4Ps assessed.    Therapy/Group: Individual Therapy  [name], OTD, OTR/L 01/06/2023, 3:54 PM

## 2023-01-07 LAB — OCCULT BLOOD X 1 CARD TO LAB, STOOL
Fecal Occult Bld: POSITIVE — AB
Fecal Occult Bld: POSITIVE — AB

## 2023-01-07 NOTE — Progress Notes (Signed)
Patient ID: Heather Molina, female   DOB: 1940-05-06, 82 y.o.   MRN: [id number]  Patient/Family Conference  Patient/family in attendance: Tina-daughter and [name], also pt was present  Staff in attendance: Dr Jacolyn Reedy [facility] Coordinator, Anastasio Auerbach and Kriste Basque Aika Brzoska-SW  Main focus: Discussed pt's spinal cord injury, her deficits, the amount of care she will require-bowel and bladder programs and follow up services and referrals.   Synopsis of information shared: MD discussed pt's injury and prognosis. Clydie Braun discussed the bowel and bladder programs and teaching-written information along with a video was given to daughters. Sarah-OT discussed the education already completed and the need for a actual car transfer which was scheduled while talking. SW discussed the home health, DME and cathers ordered and [name] gave information regarding what she had gotten for her mom. Daughter's will need to do the education of the bowel and bladder programs prior to Tuesday discharge. They plan to come in and begin this.  Barriers/concerns expressed by patient and family:No concerns expressed by the family or pt. Daughter feels it will be a process of learning and they will get through it.  Discussed the low air loss bed pt refuses this and sleep in a recliner at home and daughter has bought a Medical illustrator for pt. Rental hospital bed will be used for the I & O caths and bowel program.  Patient/family response:Daughter's have been in for education and training and will need to do the nursing portion with bowel and bladder programs. Have plans in place for mom at home.  Follow-up/action plans:Scheduled car transfer with PT-[facility] for Monday at 2;00-3:00. Daughter's to be here to do bladder and bowel programs from now until Tuesday discharge. [name] has ordered a lift for the car and transfers and we'll see if works this weekend when she tests on herself. Continue to work on plans and preparation for  discharge on Tuesday

## 2023-01-07 NOTE — Patient Care Conference (Signed)
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 Genice Rouge Social Worker Present: Dossie Der, LCSW Nurse Present: Vedia Pereyra, RN PT Present: Truitt Leep, PT OT Present: [name], OT     Current Status/Progress Goal Weekly Team Focus  Bowel/Bladder   Patient is being I/O cath q6 hrs and continue with bowel program   Patient will regain regularity with her bowel functioning   Assess patient responds with bowel program and toileting    Swallow/Nutrition/ Hydration               ADL's   SBA UB ADL, Mod A LB ADLs,, Min A bathing, Min A transfer board transfers   Min A   family Ed, family meeting, endurance    Mobility   min/mod bed mobility, CGA/min slide board transfer, CGA to supervision PWC mobility   min A, PWC mod I  family training and meeting    Communication                Safety/Cognition/ Behavioral Observations               Pain    Denies pain  Pain well controlled with use of PRN medications  Assess for pain every 4 hours and prior to  therapy sessions.    Skin   Continue to assess patient skin and positioning status wih Q2 hrs positioning,LUE skin tear healing well, bilateral heels monitoring foam inplace,    Healing wounds and skin tears that are free of additional infection and breakdown  Ongoing education with patient and family doing I/O cath and bowel program with digital stimulation     Discharge Planning:  [name] to begin family training today, we'll need to disucss if pt wants a power chair or just a manuel chair. Family conferecne this Thursday. 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 Wound care plan in place. Family education ongoing with I/O cath and bowel program with digital stimulation.  Minimal output from bowel program. Family meeting this Thursday. Denies pain.  Has DTI to sacrum with unattached edges. Stage II to right and left heel as well as left hip, all with attached edges without drainage. Skin tear to left arm with unattached edges with minimal SS drainage. PRAFO boots to keep heels elevated off mattress. Sleep chart. Bleeding ulcer. BUN/Cr improving.  Family education today.   Patient on target to meet rehab goals: May have to downgrade some goals with discharge 01/12/2023  *See Care Plan and progress notes  for long and short-term goals.   Revisions to Treatment Plan:  EDG 01/04/23.  Watching blood pressure.  KUB was negative. Monitor labs and VS Teaching Needs: Medications, bowel/bladder training, safety, self care, transfer training, skin care, etc.   Current Barriers to Discharge: Decreased caregiver support, Neurogenic bowel and bladder, and Wound care  Possible Resolutions to Barriers: Family education Family able to manage bowel and bladder program independently Independent with wound/skin care Order recommended DME     Medical Summary Current Status: back to regular- diet-  small mucusy BM's- cathing and bladder incontinence in between- pain not issue- DTI on sacrum- minimal L arm large skin tear  Barriers to Discharge: Behavior/Mood;Complicated Wound;Incontinence;Neurogenic Bowel & Bladder;Uncontrolled Hypertension;Self-care education;Spasticity;Medical stability;Weight bearing restrictions  Barriers to Discharge Comments: limited by daughter and pt, and O2- needs power vs manual w/c (daughter doesn't want power w/c)- also limited by frailty; Neurogenic bowel and bladder- Possible Resolutions to [employer] Focus: wants foldable tilt and space w/c- carafate for duodenal ulcer can restart Elqiuis tmorrow- will call daughter again t o discuss- downgrade goals- since cannot meet them- Family meeting Thursday at 10am- d/c 11/19   Continued Need for Acute Rehabilitation Level of Care: The patient requires daily medical management by a physician with specialized training in physical medicine and rehabilitation for the following reasons: Direction of a multidisciplinary physical rehabilitation program to maximize functional independence : Yes Medical management of patient stability for increased activity during participation in an intensive rehabilitation regime.: Yes Analysis of laboratory values and/or radiology reports with any subsequent need for medication adjustment and/or medical intervention. : Yes   I attest that I was present, lead the team conference, and concur with the assessment and plan of the team.   Jearld Adjutant 01/05/2023, 11:35 AM

## 2023-01-07 NOTE — Progress Notes (Signed)
Pt daughters educated on intermittent catheterization and bowel program. Family was able to demonstrate skills. Pt/family have no further questions or concerns at this time.     01/07/23 1800  Unmeasured Output  Urine Occurrence 1  Stool Occurrence 0  Emesis Occurrence 1  Urine Characteristics  Urinary Incontinence No  Urine Color Yellow/straw  Urine Appearance Clear  Urinary Interventions Intermittent/Straight cath  Intermittent/Straight Cath (mL) 400 mL  Hygiene Peri care  Stool Characteristics  Bowel Incontinence Yes  Stool Type Type 6 (Mushy consistency with ragged edges)  Has the patient had three Type 7 stools in the last 24 hours? No  Stool Descriptors Brown  Stool Amount Small  Stool Source Rectum

## 2023-01-07 NOTE — Progress Notes (Signed)
Physical Therapy Session Note  Patient Details  Name: Heather Molina MRN: [id number] Date of Birth: 1940/08/14  Today's Date: 01/07/2023 PT Individual Time: 1120-1205 PT Individual Time Calculation (min): 45 min   Short Term Goals: Week 3:  PT Short Term Goal 1 (Week 3): STG's = LTG's due to ELOS  Skilled Therapeutic Interventions/Progress Updates: Pt presented in bed with dgt present agreeable to therapy. Pt denies pain at rest. Session focused on transfers, pressure relief techniques and general conditioning. PTA donned shoes total A, pt completed supine to sit with minA for BLE management only. PTA set up Slide board and pt completed Slide board transfer with small incline and minA. Pt transported to day room and worked on varying pressure relief techniques as pt will d/c home in standard w/c. Pt was unable to complete lateral leans but was able to use arm rests to pull away from w./c back and bring hands to knees. After several repetitions pt attempted to lower legs past knees but per pt caused back pain therefore deferred. Pt propelled w/c 46ft with BUE for general conditioning. Pt transported remaining distance back to room pt completed Slide board transfer to level surface with CGA and this therapist primarily blocking pt anteriorly. Pt required modA for sit to supine. Pt boosted to [facility] and left with nsg present in room to perform I/O cath.      Therapy Documentation Precautions:  Precautions Precautions: Fall Precaution Comments: paraplegia Restrictions Weight Bearing Restrictions: No General:   Vital Signs: Therapy Vitals Temp: 97.7 F (36.5 C) Pulse Rate: 86 Resp: 16 BP: (!) 133/50 Patient Position (if appropriate): Lying Oxygen Therapy SpO2: 100 % O2 Device: Nasal Cannula O2 Flow Rate (L/min): 2 L/min  Therapy/Group: Individual Therapy  Heather Molina 01/07/2023, 4:03 PM

## 2023-01-07 NOTE — Progress Notes (Signed)
Patient ID: Heather Molina, female   DOB: [birth year], 82 y.o.   MRN: [id number] Attempted to contact [employer] due to daughter confused when spoke with. Spoke with Tobi Bastos to reiterate the size cath 16 french and she cath's 4x daily every 6 hours. Have left message expect return call. Will also reach out to [facility] regarding order.

## 2023-01-07 NOTE — Progress Notes (Signed)
Stool specimen colleted and sent to lab

## 2023-01-07 NOTE — Evaluation (Signed)
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 [facility])     Past Medical History:      Past Medical History:  Diagnosis Date   Acute foot pain, right 04/15/2022   Acute on chronic respiratory failure with hypoxia (HCC) 08/30/2022   Age-related osteoporosis without current pathological fracture 04/06/2018   ALLERGIC RHINITIS 05/20/2009    Qualifier: Diagnosis of   By: Excell Seltzer CMA, Lawson Fiscal      Replacing diagnoses that were inactivated after the 05/25/22 regulatory import   Anxiety     Arthritis, multiple joint involvement 04/06/2018   C O P D 05/20/2009    Qualifier: Diagnosis of   By: Excell Seltzer CMA, Lori      Replacing diagnoses that were inactivated after the 05/25/22 regulatory import   CAD (coronary artery disease) 06/29/2022   Calculus of gallbladder with chronic cholecystitis without obstruction 04/30/2016   CAP (community acquired pneumonia) 05/13/2019   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/26/2020   Cigarette nicotine dependence without complication 12/26/2020   COPD with acute exacerbation (HCC) 08/30/2022   Elevated brain natriuretic peptide (BNP) level 08/30/2022   G E R D 05/20/2009    Qualifier: Diagnosis of   By: Excell Seltzer CMA, Lori       Generalized edema 08/08/2019   History of CVA (cerebrovascular accident) 12/26/2020   Hypertension     Hypoxia 05/13/2019   Irritable bowel syndrome with diarrhea 09/27/2019   Leukocytosis 05/13/2019   Lipoma of torso 04/07/2019   Mixed dyslipidemia 04/06/2018   NSVT (nonsustained ventricular tachycardia) (HCC) 05/13/2019   Paroxysmal atrial fibrillation (HCC) 06/29/2022   Prolonged Q-T interval on ECG 05/13/2019   Recurrent major depressive disorder, in full remission  (HCC) 04/06/2018   Respiratory infection 04/15/2022   Right inguinal hernia 03/02/2018   Sebaceous cyst 04/07/2019   TOBACCO ABUSE 05/21/2009    Qualifier: Diagnosis of   By: Craige Cotta MD, Vineet            Past Surgical History:       Past Surgical History:  Procedure Laterality Date   CHOLECYSTECTOMY       ENDARTERECTOMY FEMORAL Right 12/07/2022    Procedure: RIGHT FEMORAL ENDARTERECTOMY;  Surgeon: Daria Pastures, MD;  Location: [facility] OR;  Service: Vascular;  Laterality: Right;   HERNIA REPAIR       PATCH ANGIOPLASTY Right 12/07/2022    Procedure: RIGHT FEMORAL PATCH ANGIOPLASTY USING BOVINE PATCH;  Surgeon: Daria Pastures, MD;  Location: [facility] OR;  Service: Vascular;  Laterality: Right;   THORACIC AORTIC ENDOVASCULAR STENT GRAFT N/A 12/07/2022    Procedure: TEVAR;  Surgeon: Daria Pastures, MD;  Location: [facility] OR;  Service: Vascular;  Laterality: N/A;   ULTRASOUND GUIDANCE FOR VASCULAR ACCESS   12/07/2022    Procedure: ULTRASOUND GUIDANCE FOR VASCULAR ACCESS;  Surgeon: Daria Pastures, MD;  Location: [facility] OR;  Service: Vascular;;          Assessment & Plan Clinical Impression:  Heather Molina is an 82 year old female with a history of paroxysmal atrial fibrillation maintained on Eliquis who presented to [facility] on 12/06/2022 with acute chest and back pain.  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  She was started on esmolol and transferred to the [facility] campus.  Vascular surgery consulted and operative consent obtained for TEVAR. She was taken to the operating room by Dr. Hetty Blend and underwent TEVAR with coverage of LSA, right femoral endarterectomy and patch angioplasty.  She was extubated and transferred to the intensive care unit for observation.  Critical care management consulted for ICU management.  On postop day 1,  she was unable to move her feet. Discussion by Dr. Hetty Blend with patient and family regarding spinal cord ischemia.  She was told of high likelihood of long-term paralysis.  It was explained to her that pressure elevation and lumbar drain placement was the only treatment to maximize recovery potential.  The patient deferred undergoing any further procedures.  Blood pressure control with MAP goal greater than 100 for 48 hours continued.  She required replacement of Foley catheter on 10/16 due to retention.  PT and OT evaluations obtained.  She remains on heparin subcutaneously for DVT prophylaxis.  She has been cleared to restart Eliquis by vascular surgery as of 10/18.  Heart rate controlled on Lopressor and Pacerone.  History of COPD and continues with nebulizer treatments.  She is oxygen dependent and maintaining saturations on 2 L via nasal cannula.  Appears to have had acute kidney injury and unknown history of chronic kidney disease.  Serum creatinine continues to improve.  She has had 450 cc of urine output last 24 hours 10/17-10/18. She is tolerating regular diet. The patient requires inpatient medicine and rehabilitation evaluations and services for ongoing dysfunction secondary to spinal card ischemia.   The patient lives with her daughter, [name], who is retired. Present at bedside. Patient reports she had pain in her shoulders and chest muscles after working with therapy a lot yesterday.  This has improved today.  She indicates she cannot tell when she needs to have a BM and cannot feel it when she has a BM.  She reports intact sensation in her legs.  Daughter indicates depressed mood. (Is on paroxetine for history of depression).  Patient transferred to CIR on 12/12/2022.   Pt presents with decreased activity tolerance, decreased functional mobility, decreased balance Limiting pt's independence with leisure/community pursuits.  Met with pt today to discuss TR services including leisure education,  activity analysis/modifications and stress management.  Also discussed the importance of social, emotional, spiritual health in addition to physical health and their effects on overall health and wellness.  Pt stated understanding.  Pt states she feels prepared for upcoming discharge.   Plan  No further TR  Recommendations for other services: None   Discharge Criteria: Patient will be discharged from TR if patient refuses treatment 3 consecutive times without medical reason.  If treatment goals not met, if there is a change in medical status, if patient makes no progress towards goals or if patient is discharged from hospital.  The above assessment, treatment plan, treatment alternatives and goals were discussed and mutually agreed upon: by patient  Jaeveon Ashland 01/07/2023, 8:34 AM

## 2023-01-07 NOTE — Progress Notes (Signed)
Occupational Therapy Session Note  Patient Details  Name: Heather Molina MRN: [id number] Date of Birth: 09/01/1940  Today's Date: 01/07/2023 OT Individual Time: 8416-6063 & 1000-1102 OT Individual Time Calculation (min): 42 min & 62 min   Short Term Goals: Week 4:  OT Short Term Goal 1 (Week 4): STG=LTG d/t ELOS  Skilled Therapeutic Interventions/Progress Updates:      Therapy Documentation Precautions:  Precautions Precautions: Fall Precaution Comments: paraplegia Restrictions Weight Bearing Restrictions: No Session 1 General: "What am I going to do without you when I leave?" Pt supine in bed upon OT arrival, agreeable to OT session.  Pain: no pain reported  ADL: Bed mobility: Min A for LE management for log roll Grooming: set-up seated in bed for oral hygiene and brushing hair UB dressing: Min A d/t being in bed, usually pt SBA seated in W/C LB dressing: Mod A for pants management with rolling technique bed level Footwear: total A for TEDS and PRAFO, pt educated on on purpose of wearing PRAFO in bed in order to prevent foot drop and skin break down Bathing: OT assisted pt with washing hair with shower cap and back   Other Treatments: OT educated pt on topics covered during family meeting in later session. OT educated pt that we will start practicing using manual W/C in order to simulate D/C  mobility.    Pt supine in bed with bed alarm activated, 2 bed rails up, call light within reach and 4Ps assessed.   Session 2 (family meeting) General: "I'll see you Sunday." Pt supine in bed upon OT arrival, agreeable to OT session. Daughters present for family ed.  Pain: no pain reported  Other Treatments: Patient, pt care partner, Dr Berline Chough, Kriste Basque CSW, Clydie Braun RN coordinator and Huntley Dec OT present at family meeting. Pt and care partners (daughters) educated on B&B, functional level of assistance for ADLs and functional mobility/transfers. Pt educated specifically on CARE tool levels  of assistance provided at current status and level of assistance for OT goals once at D/C. It is recommended care partner to complete family training in order to provide increase understanding ov level of assistance necessary once D/C. Daughters would like to private purchase W/C cushion for standard chair for pressure relief strategies, PT aware. OT reiterated topics spoken about in family training 11/12.    Pt supine in bed with bed alarm activated, 2 bed rails up, call light within reach and 4Ps assessed.   Therapy/Group: Individual Therapy  [name], OTD, OTR/L 01/07/2023, 3:49 PM

## 2023-01-07 NOTE — Progress Notes (Signed)
IP Rehab Bowel Program Documentation   Bowel Program Start time 2015  Dig Stim Indicated? Yes  Dig Stim Prior to Suppository or mini Enema X 2  Output from dig stim: Small  Ordered intervention: Suppository Yes , mini enema No ,   Repeat dig stim after Suppository or Mini enema  X 2,  Output? Large   Bowel Program Complete? Yes ,  Patient Tolerated? Yes

## 2023-01-07 NOTE — Progress Notes (Addendum)
PROGRESS NOTE   Subjective/Complaints:  Pt reports large BM last night with Sorbitol  D/w pt about Eliquis- she said she feels better off it than on it- however explained restarted yesterday Also room hot last night so was sweating.   Feels good this AM.   Daughter mentioned has appt with Vascular 11/22 but wants to get CT done before leaves hospital.   ROS:     Pt denies SOB, abd pain, CP, N/V/C/D, and vision changes  Except for HPI  Objective:   No results found. Recent Labs    01/05/23 0508 01/06/23 0523  HGB 8.5* 8.7*  HCT 26.3* 26.8*     No results for input(s): "NA", "K", "CL", "CO2", "GLUCOSE", "BUN", "CREATININE", "CALCIUM" in the last 72 hours.       Intake/Output Summary (Last 24 hours) at 01/07/2023 1203 Last data filed at 01/07/2023 0954 Gross per 24 hour  Intake 800 ml  Output 2101 ml  Net -1301 ml     Pressure Injury 12/12/22 Sacrum Medial Deep Tissue Pressure Injury - Purple or maroon localized area of discolored intact skin or blood-filled blister due to damage of underlying soft tissue from pressure and/or shear. (Active)  12/12/22 1844  Location: Sacrum  Location Orientation: Medial  Staging: Deep Tissue Pressure Injury - Purple or maroon localized area of discolored intact skin or blood-filled blister due to damage of underlying soft tissue from pressure and/or shear.  Wound Description (Comments):   Present on Admission: Yes     Pressure Injury 12/14/22 Heel Left;Lateral;Posterior Stage 2 -  Partial thickness loss of dermis presenting as a shallow open injury with a red, pink wound bed without slough. (Active)  12/14/22 (first assessed by [facility]) 1221  Location: Heel  Location Orientation: Left;Lateral;Posterior  Staging: Stage 2 -  Partial thickness loss of dermis presenting as a shallow open injury with a red, pink wound bed without slough.  Wound Description (Comments):   Present  on Admission: No     Pressure Injury 12/14/22 Heel Right;Lateral Stage 2 -  Partial thickness loss of dermis presenting as a shallow open injury with a red, pink wound bed without slough. (Active)  12/14/22 (first assessed by [facility]) 1221  Location: Heel  Location Orientation: Right;Lateral  Staging: Stage 2 -  Partial thickness loss of dermis presenting as a shallow open injury with a red, pink wound bed without slough.  Wound Description (Comments):   Present on Admission: No     Pressure Injury 12/14/22 Ischial tuberosity Left;Medial Stage 2 -  Partial thickness loss of dermis presenting as a shallow open injury with a red, pink wound bed without slough. blister (Active)  12/14/22 (first assessed by [facility] nurse) 1221  Location: Ischial tuberosity  Location Orientation: Left;Medial  Staging: Stage 2 -  Partial thickness loss of dermis presenting as a shallow open injury with a red, pink wound bed without slough.  Wound Description (Comments): blister  Present on Admission: No     Physical Exam: Vital Signs Blood pressure (!) 121/44, pulse 89, temperature 98.2 F (36.8 C), resp. rate 17, height 5' (1.524 m), weight 50.5 kg, SpO2 99%.  General: awake, alert, appropriate,  supine in bed- woke from sleep; NAD HENT: conjugate gaze; oropharynx dry- O2 in place CV: regular rate- irregular rhythm; no JVD Pulmonary: CTA B/L; no W/R/R- GI: soft, NT, ND, (+)BS Psychiatric: appropriate Neurological: Ox3 MSK- no movement in LE's- can move muscles in abdomen to slightly move RLE, but not in RLE Prior exams MS; was able to move RLE< but from abd muscles- not in LE muscles- no movement seen in LLE either-  Skin: Groin incision site looks okay, bruising noted bilateral shins 2 IVs in place left upper extremity looks okay Extremities: unable to palpate L radial pulse but dopplers confirm pulse with rate in 60-70s, arm well perfused appearing, warm and pink, motor function preserved.  Brachial pulse also unable to be felt. R radial pulse strong and palpable.    Neuro:   Mental Status: AAOx3, memory intact,  Speech/Languate:  fluent, follows simple commands CRANIAL NERVES: II: PERRL. Visual fields full III, IV, VI: EOM intact, no gaze preference or deviation V: normal sensation bilaterally VII: no asymmetry VIII: normal hearing to speech IX, X: normal palatal elevation XI: 5/5 head turn and 5/5 shoulder shrug bilaterally XII: Tongue midline     MOTOR: RUE: 5/5 Deltoid, 5/5 Biceps, 5/5 Triceps,5/5 Grip LUE: 5/5 Deltoid, 5/5 Biceps, 5/5 Triceps, 5/5 Grip RLE: 0 out of 5 throughout LLE: 0 out of 5 throughout, exam stable 11/6   SENSORY: Normal to touch all 4 extremities to light touch in bilateral upper and lower extremities, patchy altered sensation to cold in lower extremities   No ankle clonus bilaterally   Coordination: Normal finger to nose, no dysmetria  Assessment/Plan: 1. Functional deficits which require 3+ hours per day of interdisciplinary therapy in a comprehensive inpatient rehab setting. Physiatrist is providing close team supervision and 24 hour management of active medical problems listed below. Physiatrist and rehab team continue to assess barriers to discharge/monitor patient progress toward functional and medical goals  Care Tool:  Bathing    Body parts bathed by patient: Right arm, Left arm, Chest, Abdomen, Front perineal area, Face, Right upper leg, Left upper leg   Body parts bathed by helper: Right lower leg, Left lower leg, Buttocks     Bathing assist Assist Level: Minimal Assistance - Patient > 75% (roll in shower chair)     Upper Body Dressing/Undressing Upper body dressing   What is the patient wearing?: Pull over shirt    Upper body assist Assist Level: Supervision/Verbal cueing    Lower Body Dressing/Undressing Lower body dressing      What is the patient wearing?: Incontinence brief, Pants     Lower body  assist Assist for lower body dressing: Maximal Assistance - Patient 25 - 49%     Toileting Toileting    Toileting assist Assist for toileting: Total Assistance - Patient < 25%     Transfers Chair/bed transfer  Transfers assist  Chair/bed transfer activity did not occur: Safety/medical concerns  Chair/bed transfer assist level: Moderate Assistance - Patient 50 - 74%     Locomotion Ambulation   Ambulation assist   Ambulation activity did not occur: Safety/medical concerns (paraplegia, fatigue)          Walk 10 feet activity   Assist  Walk 10 feet activity did not occur: Safety/medical concerns (paraplegia, fatigue)        Walk 50 feet activity   Assist Walk 50 feet with 2 turns activity did not occur: Safety/medical concerns (paraplegia, fatigue)  Walk 150 feet activity   Assist Walk 150 feet activity did not occur: Safety/medical concerns (paraplegia, fatigue)         Walk 10 feet on uneven surface  activity   Assist Walk 10 feet on uneven surfaces activity did not occur: Safety/medical concerns (paraplegia, fatigue)         Wheelchair     Assist Is the patient using a wheelchair?: Yes Type of Wheelchair: Power Wheelchair activity did not occur: Safety/medical concerns (paraplegia, fatigue)  Wheelchair assist level: Contact Guard/Touching assist Max wheelchair distance: 200    Wheelchair 50 feet with 2 turns activity    Assist    Wheelchair 50 feet with 2 turns activity did not occur: Safety/medical concerns (paraplegia, fatigue)   Assist Level: Contact Guard/Touching assist   Wheelchair 150 feet activity     Assist  Wheelchair 150 feet activity did not occur: Safety/medical concerns (paraplegia, fatigue)   Assist Level: Contact Guard/Touching assist   Blood pressure (!) 121/44, pulse 89, temperature 98.2 F (36.8 C), resp. rate 17, height 5' (1.524 m), weight 50.5 kg, SpO2 99%.  Medical Problem List and  Plan: 1. 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  2.  Antithrombotics: -DVT/anticoagulation:  Pharmaceutical: Eliquis 2.5mg  BID-- on hold for EGD 01/04/23 11/11- can be restarted in 2 days at 2.5 mg BID per GI-  11/13- restarted Elqiuis -antiplatelet therapy: Aspirin 81 mg daily- discontinued per cardiology recommendation    3. Pain Management: continue Tylenol, robaxin, oxycodone as needed 10/24- will wait to try Tramadol for pain/discomfort til have tried  Remeron 7.5mg  for sleep 10/31- stopped Remeron 4. Mood/Behavior/Sleep: LCSW to evaluate and provide emotional support             -continue paroxetine 20 mg daily (hx of depression)             -antipsychotic agents: n/a -12/13/22 hasn't been sleeping well, increase melatonin to 10mg  QHS, added Trazodone 25mg  PRN but advised to be cautious about using it too late.  10/21- will stop Trazodone- can impair ability to pee- will start Restoril 7.5 mg at bedtime for sleep.  10/22- cannot start Remeron due to risk of torsades de pointes- will con't Restoril  10/23- didn't sleep well again- will increase Restoril to 15 mg at bedtime 10/24- spoke to Cards- they are ok trying Remeron but wait on Tramadol-will start 15 mg at bedtime -will stop Restoril>> down to 7.5mg  -12/19/22 slept "wonderfully"! Cont regimen 10/28- Slept poorly- still on rmeron- sounds like every other night she slept well 10/30- sleeping better 10/31- stopped remeron- slept ok yesterday as well  01/02/23 not sleeping well per patient, but d/t bed; monitor for now 11/11- sleeping "as well as can".  5.  Neuropsych/cognition: This patient is capable of making decisions on her own behalf.   6. Skin/Wound Care: Routine skin care checks   7. Fluids/Electrolytes/Nutrition: Routine Is and Os and follow-up chemistries  -01/02/23 BUN down to 27 this morning, Cr 0.92, monitor routinely   11/11- BUN down to 21 from  27 and Cr 1.00- doing better 8: Hypertension: monitor TID and prn (hx of a.fib see meds below). Monitor with mobility. Continue metoprolol 25mg  BID  Reduced Metoprolol per pt request 10/24- will add Norvasc 2.5 mg daily since BP elevated and not coming down  10/25- calling IM- maybe they can help- just added Norvasc yesterday -12/19/22 BPs improving a little this morning, monitor -12/20/22 BPs still a tad elevated but stable, monitor a couple more days before deciding on changes for now 10/28- BP on low side, but Hb low- will transfuse 2 units 10/29- BP looking more labile- but better than it was- monitor for now 11/1: continue current regimen -12/26/22 BPs variable, monitor trend for now 11/5- BP running 130s- 170's- Will increase Norvasc to 5 mg daily  11/7 BP stable, metoprolol discontinued by cardiology 11/8 BP above goal today.  If continues to be elevated cardiology recommends addition of ARB and would not recommend further titration of amlodipine. Will hold off on change for now as would like to avoid hypotension -11/9-10/24 BPs a bit better, cont regimen  11/11- BP doing OK- intermittently in 140's- con't regimen  11/12-11/13 BP in 140s- but holding in low to mid 140s-   11/14- BP running 120s to 140s- off B blocker- family does not want her on one.  Vitals:   01/04/23 1350 01/04/23 1400 01/04/23 1430 01/04/23 1954  BP: (!) 143/55 (!) 144/67 132/77 (!) 147/61   01/05/23 0512 01/05/23 1513 01/05/23 1939 01/06/23 0435  BP: (!) 148/59 (!) 153/62 (!) 158/54 (!) 141/64   01/06/23 1432 01/06/23 1913 01/07/23 0528 01/07/23 0806  BP: (!) 162/55 (!) 140/53 (!) 131/58 (!) 121/44    9:  Hyperlipidemia: continue pravastatin 40mg  daily   10: COPD, O2 dependent 2L via [place]:             -continue Brovana nebs BID             -continue Yupelri neb daily   10/21- Has been on 2L O2 at home per pt and chart  10/25- ABG shows CO2 too low- needs to reduce O2 levels  10/31- Sats ok- con't O2  11: CAD: continue asa and statin   12: Atrial fibrillation, paroxysmal: maintained on Eliquis reversed pre-operatively             -continue amiodarone 100 mg daily             -continue metoprolol tartrate 25 mg BID             -cleared by [facility] to re-start Eliquis>pharmacy consult placed   10/25- reduced Metoprolol to 12.5 mg BID earlier in week - doing better  10/30- BP doing better 11/8 cardiology does not feel she is currently in A-fib, they recommend continue amiodarone 100 mg daily and avoid nodal agents, continue Eliquis  11/11- held Eliquis due to GI bleed- can restart 11/13  11/13- will restart Eliquis 2.5 mg BID 13: 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 10/22- not voiding -U/A (-)  will add Flomax 0.4 mg q supper to see if she can void- I don't think it's likely. D/w nursing due to low amount of caths-  -12/19/22 U/A overnight looks infectious, will start Bactrim DS BID x5d, UCx pending; monitor -12/20/22 UCx with >100k CFU of K. Pneumoniae, sensitive to bactrim, cont abx course 10/29- Being  cathed- volumes low- pushing fluids, but will also give 1 L IVFs 10/30- not drinking- Got IVFs but BUN up to 45 and Cr 1.41- will call renal- and recheck in AM-- a little improved on 10/31 labs 11/5- drinking much better- volumes looking better- and Cr down to 1.16 and BUN 29 from 40's 11/11- Volumes on caths doing better 11/12- Cath volumes looking good 11/14- caths going ok- explained/educated pt/family about if starts having a lot of leakage between caths, will need to start Gemtesa vs Oxybutynin -  17: ABLA: stable; follow CBC trend             -consider oral iron supplement -12/13/22 Hgb slowly downtrending from postop 8.8 on 10/14>7.6>7.5>7.3>7.0 yesterday; pt asymptomatic at this time; would recheck tomorrow but if still at the 7.0-range then would consider transfusion to help with therapy tolerance; unclear what her prior baseline was (has hgb 12.2 back in July but then down to 9's).    10/21- Hb 7.4 today  10/23- Hb 7.2 yesterday- will check in AM 10/24- Hb down to 7.0- will d/w pt if she wants to be transfused. If so, will give 1 unit pRBCs.  Doesn't have location of losing blood- will check FOBT x3  10/25- Hb 8.1- likely too dry  10/28- Hb 6.7- will transfuse 2 units 10/29- Hb much better AND pt feels MUCH better per pt- Hb up to 9.9- likely so high since pt really dry- will also rewrite for FOBT- hasn't been done  10/30- no bowel results- so cannot check FOBT yet.  10/31- had 2 extra large Bms last night- didn't check FOBT_ will order again. Will d/w nursing 11/4: Hgb reviewed and is stable 11/5- Hb 9.2 11/8 HGB down to 8.7 yesterday, FOBT postive, has had multiple dark stools recovered, will contact GI, continue PPI -GI recommends to hold Eliquis per note.  Discussed with Dr. Vivianne Master discontinue for now.  Eliquis stopped.  Appreciate GI/cardiology assistance -01/02/23 Hgb stable at 8.2 today, monitor -01/03/23 Hgb down to 8.0; transfuse to keep hgb >7 per GI notes; plan for EGD  tomorrow; per notes, PPI IV but PO currently, asked GI and they said PO is fine. Will leave order in place.  11/11- Hb 8.4- will recheck in AM after EGD and see if going up at all- argon used for lesion- might also have esophageal candida- waiting on biopsy results-  -on PPI BID and Carafate QID for 2 weeks per GI- they signed off 11/12- looking at Surgical pathology- doesn' thave fungal elements- so unlikely to be Fungus/thrush- Hb 8.5 11.13- no Thrush/candidada esophagitis per GI 18: AKI: improving; CMP 12/12/22 showing Cr 1.31; monitor Monday BMPs 10/21- Cr 1.09 down from 1.31 and BUN 25 down from 25- however is drinking poorly and has poor output- 10/23- Says drinking better- will check BMP in AM  10/25- Cr 0.9 and BUN slightly high at 22- but based on Hb increase, likely dry 10/28- Cr 1.4 and BUN elevated-  will give blood and recheck BMP in AM 10/29- BUN up to 40 and Cr 1.41- will need to give IVFs- tried seeing if blood would help, but still needs fluids- will her BUN/Cr- will replete 75 cc/hour after therapy today and recheck BMP in AM- of note, her Cr was running ~ 1.0 and her BUN ~ 25- so big change- will push fluids as well- will give 75cc/hour for 1 liter  10/31- likely form Septra- will stop ABX-  11/4: Cr reviewed and is improving 11/5- Cr 1.16 and BUN 29 down from 40's 11/8 patient was given IV fluids yesterday for elevated BUN, recheck labs today -01/02/23 BUN down to 27, Cr 0.92, monitor 11/11- BUN 21 and Cr 1.0- doing better 19: neurogenic bowel: bowel program ordered -Dulcolax suppository ordered -12/13/22 per pt LBM yesterday, monitor 10/21- LBM this afternoon/AM- incontinent- educated pt NEEDs bowel program since not having Bms at the time to avoid therapies- going when doesn't want to- this will train gut to go when we wants her to go.  10/22- continue to refuse bowel program-in spite of education of bowel program and need for it.  10/23- pt did last night- had BM with bowel  program and one this AM, but educated can take 3-6 weeks to train gut, but should be better- had bowel incontinence for 20 years 10/24- no bowel program documented  but had small BM's at midnight and 5am- said wasn't incontinent?will check on this 10/25- asked pt to get more dig stim this evening since likely cause of bowel incontinent in AM -12/20/22 reports BM this morning but no BM since 12/17/22 documented, no report to nursing staff given; monitor closely but if no BM by tomorrow may need to consider additional adjustments 10/29- LBM yesterday AM, and small with bowel program 10/27-no results last night- if no BM tonight, will give sorbitol/check KUB  10/30- no significant BM for 3 days- will give Sorbitol 30cc at 2pm for bowel program- also add Senna 1 tab daily for meds- 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  -LBM 11/3, continue current regimen Reviewed chart and had BM 11/5 -01/03/23 no further BMs since 11/8, just mucus; see GI plan above 11/11- still having mucus- will order KUB 11/12- Small output last night- KUB shows normal gas and stool pattern- not full of stool- likely due to not eating much.  11/13- no BM last night- will order Sorbitol 30cc 1400 to help her have BM- abd appears flat/not distended- soft 11/14- large BM after bowel program last night with Sorbitol 20. Poor Appetite 10/22- will add Megace for poor appetite since cannot add Remeron and too sedated to add Periactin. Will also order push fluids since not drinking great- although her BUN?Cr looking better- BUN 25 and Cr 1.08-  10/23- pt feels she's drinking 6+ cups/day- ate muffin and some eggs this AM- better than last 2 days-  10/24- 25% of tray at most- will add Remeron 15 mg at bedtime- ok'd by Cards.  10/25- changed remeron to 7.5 mg QHS 10/30- won't eat anything for breakfast- small appetite overall-  10/31- ate 1 pancake this AM- better than normal 11/11- on full liquid diet today- can switch back  to regular diet tomorrow 11/12- ate grits this AM- put back on regular diet- Hb stable 11/13- ate all eggs and toast 21. Prolonged QT interval 10/24- spoke with Cards- they suggested starting Remeron 15 mg at bedtime and then rechecking EKG ina few days to see if can  add Tramadol. 10/29- doing ok with remeron- wait to add tramadol for now    22. Oversedation/lethargy/ UTI 10/25- septis and ABG work up- when speaking with PA- appeared to have ST elevation- asked them to call IM consult-  -12/19/22 sleepy but arousable today, U/A+, started bactrim as above, awaiting UCx; CT head neg last night, CBC/BMP stable, TSH/T4 WNL, CXR stable; suspect UTI as source. Monitor. See above #16 11/6: alert and bright this morning 11/7 -Patient alert and oriented x 4, following commands.  Vital signs stable.  Will recheck lab work and UA culture.  Overall she appears stable this morning although fatigue may be waxing and waning. 11/8 patient was restarted on Bactrim last night for suspected UTI, monitor renal function  11/12- BUN/Cr looking OK off bactrim 23. L radial pulse nonpalpable but dopplerable, BPs different in that arm-- unclear when this started, had some pain in L deltoid area yesterday but seemed to be better today. Suspect this pulse/BP discrepancy is 2/2 her vascular issue already, but reached out to vascular surgery to verify. Dr. Karin Lieu returned page, stated this is known since her surgery; nothing to do or be concerned about.   24. Pulsatile abdominal aorta: not noticed previously, not mentioned in notes, but today 12/20/22 I noticed it was very prominently pulsatile and pt reported it being sore in that area; spoke with Dr. Karin Lieu again of [facility], recommended getting CTA Abd/pelv to evaluate for AAA. 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 Bradycardia -11/7 patient is being followed by cardiology.  Metoprolol has been  stopped and ASA duration was recommended-ASA has been stopped.  -11/8 heart rate doing better today  11/12- HR doing much better- in 80s now   I spent a total of 56   minutes on total care today- >50% coordination of care- due to  rounding as well as  education of pt during rounds about need to be on Eliquis for 90 days after SCI- but then can decide with Cards about this- then also ran SCI family conference- was there for 35 minutes- educated on B/B- spasticity, Elqiuis/DVT/PE risk and risk of pressure ulcers. Also review of chart to see what CT needs to be done. Cannot find documentation- will call Vascular and see if I can order before she leaves CIR.   They said to order CTA of chest, abdomen and pelvis- order 11/17 to be done 11/18-   LOS: 26 days A FACE TO FACE EVALUATION WAS PERFORMED  Evangelene Vora 01/07/2023, 12:03 PM

## 2023-01-08 MED ORDER — SALINE SPRAY 0.65 % NA SOLN: 1.0000 | NASAL | Status: DC | PRN

## 2023-01-08 MED ORDER — FLUTICASONE PROPIONATE 50 MCG/ACT NA SUSP
2.0000 | Freq: Every day | NASAL | Status: DC
  Administered 2023-01-08 – 2023-01-12 (×5): 2 via NASAL
  Filled 2023-01-08: qty 16

## 2023-01-08 NOTE — Progress Notes (Signed)
Was asked to contact daughter regarding bowel program.  Contacted daughter who was upset and wanted patient discharged, name of medications for wounds, and when bowel program would be stopped.  Explained again purpose of bowel program and continued need for it after discharge and is not a 6 week regimen as she thought. Will defer discharge to MD.  Will get discharge instructions for wounds at time of discharge.  (I have attached to the AVS) Notified MD of conversation.

## 2023-01-08 NOTE — Progress Notes (Signed)
Physical Therapy Session Note  Patient Details  Name: Heather Molina MRN: [id number] Date of Birth: [birth year]  Today's Date: 01/08/2023 PT Individual Time: 8295-6213 PT Individual Time Calculation (min): 43 min   Short Term Goals: Week 3:  PT Short Term Goal 1 (Week 3): STG's = LTG's due to ELOS  Skilled Therapeutic Interventions/Progress Updates:    Pt recd in w/c and c/o of 4/10 pain from sitting upright since last session. Nsg provided pain medication and therapist assisted with transfer to bed, down to 2/10 before PT departure.  slideboard transfer with min A for board placement and balance, min cueing for hand placement and safety. Mod a for sit>supine for BLE management. Pt able to boost in bed using bed rail. Extended time spent with pt discussing pain management strategies for home. Also discussed d/c plan, reiterated importance of skin protection via protective clothing (especially when greeting her dog), pressure relief, and using appropriate cushions. Pt was able to verbalize how often to perform pressure relief in sitting, but reports she recently has been doing it "when I think of it." Pt reports feeling good about discharge and has no questions at this time. Declines further activity d/t discomfort and fatigue. Pt was left with all needs in reach and alarm active. Pt missed x 32 min d/t pain, fatigue.  Will attempt to make up as schedule allows.   Therapy Documentation Precautions:  Precautions Precautions: Fall Precaution Comments: paraplegia Restrictions Weight Bearing Restrictions: No General: PT Amount of Missed Time (min): 32 Minutes PT Missed Treatment Reason: Pain;Patient unwilling to participate     Therapy/Group: Individual Therapy  Juluis Rainier 01/08/2023, 12:22 PM

## 2023-01-08 NOTE — Progress Notes (Signed)
Recreational Therapy Session Note  Patient Details  Name: Heather Molina MRN: [id number] Date of Birth: 1940-05-09 Today's Date: 01/08/2023  Pain:no c/o  Pt participated in animal assisted activity bed level with supervision.  Pt appreciative of this visit and enjoyed talking about family pets.  Chriss Mannan 01/08/2023, 12:17 PM

## 2023-01-08 NOTE — Progress Notes (Signed)
Occupational Therapy Session Note  Patient Details  Name: Heather Molina MRN: [id number] Date of Birth: 08/17/1940  Today's Date: 01/08/2023 OT Individual Time: 7846-9629 OT Individual Time Calculation (min): 73 min    Short Term Goals: Week 4:  OT Short Term Goal 1 (Week 4): STG=LTG d/t ELOS   Skilled Therapeutic Interventions/Progress Updates: Patient seen for bed side ADL's. Patient agreeable to OT intervention and motivated to perform tasks for self as able. Patient performed bed bath with set up for upper body and upper legs, but needed assist for lower leg and feet. Patient able to roll for therapist to assist with changing brief and managing pants. Patient worked on ring sitting with HOB ~ 50 degrees. Patient with better mobility at R hip for donning pant leg than the left. See functional levels for ADL's below. Patient transferred to w/c using the slide board. Supine to sidelying-min assist, side to sitting EOB-mod A, Total assist to place slide board and Mod assist with transfer to w/c. Continued treatment with grooming tasks seated at the sink. Followed ADL with pressure relief education and practice in the w/c. Patient with good understanding and performance of pressure reliefs. Continue with skilled OT intervention and training for planned discharge home with daughter and family assist.     Therapy Documentation Precautions:  Precautions Precautions: Fall Precaution Comments: paraplegia Restrictions Weight Bearing Restrictions: No General:   Vital Signs: Therapy Vitals Temp: 98 F (36.7 C) Pulse Rate: 82 Resp: 18 BP: (!) 145/52 Patient Position (if appropriate): Lying Oxygen Therapy SpO2: 100 % O2 Device: Nasal Cannula O2 Flow Rate (L/min): 2 L/min Pain: Pain Assessment Pain Scale: 0-10 Pain Score: 3/10  ADL: ADL Eating: Independent Where Assessed-Eating: Bed level Grooming: Supervision/safety Where Assessed-Grooming: Sitting at sink, Wheelchair Upper Body  Bathing: Set up assistance Where Assessed-Upper Body Bathing: Bed level Lower Body Bathing: Maximal assistance Where Assessed-Lower Body Bathing: Bed level Upper Body Dressing: Set up assistance Where Assessed-Upper Body Dressing: Bed level Lower Body Dressing: Total assist level Where Assessed-Lower Body Dressing: Bed level Toileting: Not assessed (foley) Where Assessed-Toileting: Bed level Toilet Transfer: Not assessed (safety concerns) Toilet Transfer Method: Unable to assess Tub/Shower Transfer: Unable to assess Tub/Shower Transfer Method: Unable to assess Film/video editor: Unable to assess Film/video editor Method: Unable to assess ADL Comments: ADLs completed bed level d/t decreased trunk support and increased independence with bed level activities    Therapy/Group: Individual Therapy  Warnell Forester 01/08/2023, 12:50 PM

## 2023-01-08 NOTE — Progress Notes (Addendum)
Patient ID: Heather Molina, female   DOB: 1940/12/20, 82 y.o.   MRN: [id number]  Daughter called upset her Mom was left up in the wheelchair after her first therapy and her mom wanted to lay back down in the bed. Also called for pain meds and still do not have them 30 minutes later. When to see pt and she is hurting and been up too long.  Have let Olivia-PT know pt needs to go back to bed and re-schedule her PT session due to her back pain. Also have spoken with Ashley-RN pt's request for pain meds. She will get her some. Daughter also had questions regarding the bladder and bowel program and have messaged MD and karen-RN Care Coordinator regarding this.  12:17 PM Spoke again with tracy via telephone to let her know her mom is ok now. Checked with Mom. [name] would like for her Mom to go home on Monday have messaged MD/PA regarding this. Will await input.  1;17 PM MD reports vascular to do CTA on Monday. MD to call daughter

## 2023-01-08 NOTE — Progress Notes (Signed)
PROGRESS NOTE   Subjective/Complaints:  Pt reports nose running and stopped up- cannot blow it.  Also cannot cough- well- No BM last night per pt but did have small BM with daughter doing bowel program.   I have ordered CTA for Monday- of chest/abd and pelvis per d/w [facility].   ROS:    Pt denies SOB, abd pain, CP, N/V/C/D, and vision changes   Except for HPI  Objective:   No results found. Recent Labs    01/06/23 0523  HGB 8.7*  HCT 26.8*     No results for input(s): "NA", "K", "CL", "CO2", "GLUCOSE", "BUN", "CREATININE", "CALCIUM" in the last 72 hours.       Intake/Output Summary (Last 24 hours) at 01/08/2023 1243 Last data filed at 01/08/2023 0981 Gross per 24 hour  Intake --  Output 1350 ml  Net -1350 ml     Pressure Injury 12/12/22 Sacrum Medial Deep Tissue Pressure Injury - Purple or maroon localized area of discolored intact skin or blood-filled blister due to damage of underlying soft tissue from pressure and/or shear. (Active)  12/12/22 1844  Location: Sacrum  Location Orientation: Medial  Staging: Deep Tissue Pressure Injury - Purple or maroon localized area of discolored intact skin or blood-filled blister due to damage of underlying soft tissue from pressure and/or shear.  Wound Description (Comments):   Present on Admission: Yes     Pressure Injury 12/14/22 Heel Left;Lateral;Posterior Stage 2 -  Partial thickness loss of dermis presenting as a shallow open injury with a red, pink wound bed without slough. (Active)  12/14/22 (first assessed by [facility]) 1221  Location: Heel  Location Orientation: Left;Lateral;Posterior  Staging: Stage 2 -  Partial thickness loss of dermis presenting as a shallow open injury with a red, pink wound bed without slough.  Wound Description (Comments):   Present on Admission: No     Pressure Injury 12/14/22 Heel Right;Lateral Stage 2 -  Partial thickness loss of dermis  presenting as a shallow open injury with a red, pink wound bed without slough. (Active)  12/14/22 (first assessed by [facility]) 1221  Location: Heel  Location Orientation: Right;Lateral  Staging: Stage 2 -  Partial thickness loss of dermis presenting as a shallow open injury with a red, pink wound bed without slough.  Wound Description (Comments):   Present on Admission: No     Pressure Injury 12/14/22 Ischial tuberosity Left;Medial Stage 2 -  Partial thickness loss of dermis presenting as a shallow open injury with a red, pink wound bed without slough. blister (Active)  12/14/22 (first assessed by [facility] nurse) 1221  Location: Ischial tuberosity  Location Orientation: Left;Medial  Staging: Stage 2 -  Partial thickness loss of dermis presenting as a shallow open injury with a red, pink wound bed without slough.  Wound Description (Comments): blister  Present on Admission: No     Physical Exam: Vital Signs Blood pressure (!) 145/52, pulse 82, temperature 98 F (36.7 C), resp. rate 18, height 5' (1.524 m), weight 50.5 kg, SpO2 100%.          General: awake, alert, appropriate, frail- sitting up very slightly in bed; NAD HENT: conjugate gaze; oropharynx dry-  O2 in place CV: regular rate- irregular;  no JVD Pulmonary: CTA B/L; no W/R/R-  GI: soft, NT, ND, (+)BS Psychiatric: appropriate- bright affect Neurological: Ox3  Prior exams MS; was able to move RLE< but from abd muscles- not in LE muscles- no movement seen in LLE either-  Skin: Groin incision site looks okay, bruising noted bilateral shins 2 IVs in place left upper extremity looks okay Extremities: unable to palpate L radial pulse but dopplers confirm pulse with rate in 60-70s, arm well perfused appearing, warm and pink, motor function preserved. Brachial pulse also unable to be felt. R radial pulse strong and palpable.    Neuro:   Mental Status: AAOx3, memory intact,  Speech/Languate:  fluent, follows simple  commands CRANIAL NERVES: II: PERRL. Visual fields full III, IV, VI: EOM intact, no gaze preference or deviation V: normal sensation bilaterally VII: no asymmetry VIII: normal hearing to speech IX, X: normal palatal elevation XI: 5/5 head turn and 5/5 shoulder shrug bilaterally XII: Tongue midline     MOTOR: RUE: 5/5 Deltoid, 5/5 Biceps, 5/5 Triceps,5/5 Grip LUE: 5/5 Deltoid, 5/5 Biceps, 5/5 Triceps, 5/5 Grip RLE: 0 out of 5 throughout LLE: 0 out of 5 throughout, exam stable 11/6   SENSORY: Normal to touch all 4 extremities to light touch in bilateral upper and lower extremities, patchy altered sensation to cold in lower extremities   No ankle clonus bilaterally   Coordination: Normal finger to nose, no dysmetria  Assessment/Plan: 1. Functional deficits which require 3+ hours per day of interdisciplinary therapy in a comprehensive inpatient rehab setting. Physiatrist is providing close team supervision and 24 hour management of active medical problems listed below. Physiatrist and rehab team continue to assess barriers to discharge/monitor patient progress toward functional and medical goals  Care Tool:  Bathing    Body parts bathed by patient: Right arm, Left arm, Chest, Abdomen, Front perineal area, Face, Right upper leg, Left upper leg   Body parts bathed by helper: Buttocks, Right lower leg, Left lower leg     Bathing assist Assist Level: Minimal Assistance - Patient > 75%     Upper Body Dressing/Undressing Upper body dressing   What is the patient wearing?: Pull over shirt    Upper body assist Assist Level: Set up assist    Lower Body Dressing/Undressing Lower body dressing      What is the patient wearing?: Pants, Incontinence brief     Lower body assist Assist for lower body dressing: Maximal Assistance - Patient 25 - 49%     Toileting Toileting    Toileting assist Assist for toileting: Total Assistance - Patient < 25%     Transfers Chair/bed  transfer  Transfers assist  Chair/bed transfer activity did not occur: Safety/medical concerns  Chair/bed transfer assist level: Moderate Assistance - Patient 50 - 74%     Locomotion Ambulation   Ambulation assist   Ambulation activity did not occur: Safety/medical concerns (paraplegia, fatigue)          Walk 10 feet activity   Assist  Walk 10 feet activity did not occur: Safety/medical concerns (paraplegia, fatigue)        Walk 50 feet activity   Assist Walk 50 feet with 2 turns activity did not occur: Safety/medical concerns (paraplegia, fatigue)         Walk 150 feet activity   Assist Walk 150 feet activity did not occur: Safety/medical concerns (paraplegia, fatigue)         Walk 10 feet on  uneven surface  activity   Assist Walk 10 feet on uneven surfaces activity did not occur: Safety/medical concerns (paraplegia, fatigue)         Wheelchair     Assist Is the patient using a wheelchair?: Yes Type of Wheelchair: Power Wheelchair activity did not occur: Safety/medical concerns (paraplegia, fatigue)  Wheelchair assist level: Contact Guard/Touching assist Max wheelchair distance: 200    Wheelchair 50 feet with 2 turns activity    Assist    Wheelchair 50 feet with 2 turns activity did not occur: Safety/medical concerns (paraplegia, fatigue)   Assist Level: Contact Guard/Touching assist   Wheelchair 150 feet activity     Assist  Wheelchair 150 feet activity did not occur: Safety/medical concerns (paraplegia, fatigue)   Assist Level: Contact Guard/Touching assist   Blood pressure (!) 145/52, pulse 82, temperature 98 F (36.7 C), resp. rate 18, height 5' (1.524 m), weight 50.5 kg, SpO2 100%.  Medical Problem List and Plan: 1. 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 Per [facility], needs to be done Monday and not earlier-  2.  Antithrombotics: -DVT/anticoagulation:  Pharmaceutical: Eliquis 2.5mg  BID-- on hold for EGD 01/04/23 11/11- can be restarted in 2 days at 2.5 mg BID per GI-  11/13- restarted Elqiuis -antiplatelet therapy: Aspirin 81 mg daily- discontinued per cardiology recommendation    3. Pain Management: continue Tylenol, robaxin, oxycodone as needed 10/24- will wait to try Tramadol for pain/discomfort til have tried  Remeron 7.5mg  for sleep 10/31- stopped Remeron 4. Mood/Behavior/Sleep: LCSW to evaluate and provide emotional support             -continue paroxetine 20 mg daily (hx of depression)             -antipsychotic agents: n/a -12/13/22 hasn't been sleeping well, increase melatonin to 10mg  QHS, added Trazodone 25mg  PRN but advised to be cautious about using it too late.  10/21- will stop Trazodone- can impair ability to pee- will start Restoril 7.5 mg at bedtime for sleep.  10/22- cannot start Remeron due to risk of torsades de pointes- will con't Restoril  10/23- didn't sleep well again- will increase Restoril to 15 mg at bedtime 10/24- spoke to Cards- they are ok trying Remeron but wait on Tramadol-will start 15 mg at bedtime -will stop Restoril>> down to 7.5mg  -12/19/22 slept "wonderfully"! Cont regimen 10/28- Slept poorly- still on rmeron- sounds like every other night she slept well 10/30- sleeping better 10/31- stopped remeron- slept ok yesterday as well  01/02/23 not sleeping well per patient, but d/t bed; monitor for now 11/11- sleeping "as well as can".  5. Neuropsych/cognition: This patient is capable of making decisions on her own behalf.   6. Skin/Wound Care: Routine skin care checks   7. Fluids/Electrolytes/Nutrition: Routine Is and Os and follow-up chemistries  -01/02/23  BUN down to 27 this morning, Cr 0.92, monitor routinely   11/11- BUN down to 21 from 27 and Cr 1.00- doing better 8: Hypertension: monitor TID and prn (hx of a.fib see meds below). Monitor with mobility. Continue metoprolol 25mg  BID  Reduced Metoprolol  per pt request 10/24- will add Norvasc 2.5 mg daily since BP elevated and not coming down  10/25- calling IM- maybe they can help- just added Norvasc yesterday -12/19/22 BPs improving a little this morning, monitor -12/20/22 BPs still a tad elevated but stable, monitor a couple more days before deciding on changes for now 10/28- BP on low side, but Hb low- will transfuse 2 units 10/29- BP looking more labile- but better than it was- monitor for now 11/1: continue current regimen -12/26/22 BPs variable, monitor trend for now 11/5- BP running 130s- 170's- Will increase Norvasc to 5 mg daily  11/7 BP stable, metoprolol discontinued by cardiology 11/8 BP above goal today.  If continues to be elevated cardiology recommends addition of ARB and would not recommend further titration of amlodipine. Will hold off on change for now as would like to avoid hypotension -11/9-10/24 BPs a bit better, cont regimen  11/11- BP doing OK- intermittently in 140's- con't regimen  11/12-11/13 BP in 140s- but holding in low to mid 140s-   11/14- BP running 120s to 140s- off B blocker- family does not want her on one. 11/15- BP running 120s-140s systolic- con't regimen  Vitals:   01/05/23 0512 01/05/23 1513 01/05/23 1939 01/06/23 0435  BP: (!) 148/59 (!) 153/62 (!) 158/54 (!) 141/64   01/06/23 1432 01/06/23 1913 01/07/23 0528 01/07/23 0806  BP: (!) 162/55 (!) 140/53 (!) 131/58 (!) 121/44   01/07/23 1257 01/07/23 1307 01/07/23 2020 01/08/23 0605  BP: (!) 136/44 (!) 133/50 (!) 128/51 (!) 145/52    9: Hyperlipidemia: continue pravastatin 40mg  daily   10: COPD, O2 dependent 2L via [place]:             -continue Brovana nebs BID             -continue Yupelri neb daily    10/21- Has been on 2L O2 at home per pt and chart  10/25- ABG shows CO2 too low- needs to reduce O2 levels  10/31- Sats ok- con't O2  11/15- stable 11: CAD: continue asa and statin   12: Atrial fibrillation, paroxysmal: maintained on Eliquis reversed pre-operatively             -continue amiodarone 100 mg daily             -continue metoprolol tartrate 25 mg BID             -cleared by [facility] to re-start Eliquis>pharmacy consult placed   10/25- reduced Metoprolol to 12.5 mg BID earlier in week - doing better  10/30- BP doing better 11/8 cardiology does not feel she is currently in A-fib, they recommend continue amiodarone 100 mg daily and avoid nodal agents, continue Eliquis  11/11- held Eliquis due to GI bleed- can restart 11/13  11/13- will restart Eliquis 2.5 mg BID 13: 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 10/22- not voiding -U/A (-)  will add Flomax 0.4 mg q supper to see if she can void- I don't think it's likely. D/w nursing due to low amount of caths-  -12/19/22 U/A overnight looks infectious, will start Bactrim DS BID x5d, UCx pending; monitor -12/20/22 UCx with >100k CFU of K. Pneumoniae, sensitive to bactrim, cont abx course 10/29- Being cathed- volumes low- pushing fluids, but will also give 1 L IVFs 10/30- not drinking- Got IVFs but BUN up to 45 and Cr 1.41- will call renal- and recheck in  AM-- a little improved on 10/31 labs 11/5- drinking much better- volumes looking better- and Cr down to 1.16 and BUN 29 from 40's 11/11- Volumes on caths doing better 11/12- Cath volumes looking good 11/14- caths going ok- explained/educated pt/family about if starts having a lot of leakage between caths, will need to start Gemtesa vs Oxybutynin - 11/15- Bladder cath going well  17: ABLA: stable; follow CBC trend             -consider oral iron supplement -12/13/22 Hgb slowly downtrending from postop 8.8 on 10/14>7.6>7.5>7.3>7.0 yesterday; pt asymptomatic at this time; would recheck tomorrow but if still at the 7.0-range then would consider transfusion to help with therapy tolerance; unclear what her prior baseline was (has hgb 12.2 back in July but then down to 9's).    10/21- Hb 7.4 today  10/23- Hb 7.2 yesterday- will check in AM 10/24- Hb down to 7.0- will d/w pt if she wants to be transfused. If so, will give 1 unit pRBCs.  Doesn't have location of losing blood- will check FOBT x3  10/25- Hb 8.1- likely too dry  10/28- Hb 6.7- will transfuse 2 units 10/29- Hb much better AND pt feels MUCH better per pt- Hb up to 9.9- likely so high since pt really dry- will also rewrite for FOBT- hasn't been done  10/30- no bowel results- so cannot check FOBT yet.  10/31- had 2 extra large Bms last night- didn't check FOBT_ will order again. Will d/w nursing 11/4: Hgb reviewed and is stable 11/5- Hb 9.2 11/8 HGB down to 8.7 yesterday, FOBT postive, has had multiple dark stools recovered, will contact GI, continue PPI -GI recommends to hold Eliquis per note.  Discussed with Dr. Vivianne Master discontinue for now.  Eliquis stopped.  Appreciate GI/cardiology assistance -01/02/23 Hgb stable at 8.2 today, monitor -01/03/23 Hgb down to 8.0; transfuse to keep hgb >7 per GI notes; plan for EGD tomorrow; per notes, PPI IV but PO currently, asked GI and they said PO is fine. Will leave order in place.  11/11- Hb 8.4-  will recheck in AM after EGD and see if going up at all- argon used for lesion- might also have esophageal candida- waiting on biopsy results-  -on PPI BID and Carafate QID for 2 weeks per GI- they signed off 11/12- looking at Surgical pathology- doesn' thave fungal elements- so unlikely to be Fungus/thrush- Hb 8.5 11.13- no Thrush/candidada esophagitis per GI 18: AKI: improving; CMP 12/12/22 showing Cr 1.31; monitor Monday BMPs 10/21- Cr 1.09 down from 1.31 and BUN 25 down from 25- however is drinking poorly and has poor output- 10/23- Says drinking better- will check BMP in AM  10/25- Cr 0.9 and BUN slightly high at 22- but based on Hb increase, likely dry 10/28- Cr 1.4 and BUN elevated- will give blood and recheck BMP in AM 10/29- BUN up to 40  and Cr 1.41- will need to give IVFs- tried seeing if blood would help, but still needs fluids- will her BUN/Cr- will replete 75 cc/hour after therapy today and recheck BMP in AM- of note, her Cr was running ~ 1.0 and her BUN ~ 25- so big change- will push fluids as well- will give 75cc/hour for 1 liter  10/31- likely form Septra- will stop ABX-  11/4: Cr reviewed and is improving 11/5- Cr 1.16 and BUN 29 down from 40's 11/8 patient was given IV fluids yesterday for elevated BUN, recheck labs today -01/02/23 BUN down to 27, Cr 0.92, monitor 11/11- BUN 21 and Cr 1.0- doing better 19: neurogenic bowel: bowel program ordered -Dulcolax suppository ordered -12/13/22 per pt LBM yesterday, monitor 10/21- LBM this afternoon/AM- incontinent- educated pt NEEDs bowel program since not having Bms at the time to avoid therapies- going when doesn't want to- this will train gut to go when we wants her to go.  10/22- continue to refuse bowel program-in spite of education of bowel program and need for it.  10/23- pt did last night- had BM with bowel program and one this AM, but educated can take 3-6 weeks to train gut, but should be better- had bowel incontinence for 20  years 10/24- no bowel program documented  but had small BM's at midnight and 5am- said wasn't incontinent?will check on this 10/25- asked pt to get more dig stim this evening since likely cause of bowel incontinent in AM -12/20/22 reports BM this morning but no BM since 12/17/22 documented, no report to nursing staff given; monitor closely but if no BM by tomorrow may need to consider additional adjustments 10/29- LBM yesterday AM, and small with bowel program 10/27-no results last night- if no BM tonight, will give sorbitol/check KUB  10/30- no significant BM for 3 days- will give Sorbitol 30cc at 2pm for bowel program- also add Senna 1 tab daily for meds- 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  -LBM 11/3, continue current regimen Reviewed chart and had BM 11/5 -01/03/23 no further BMs since 11/8, just mucus; see GI plan above 11/11- still having mucus- will order KUB 11/12- Small output last night- KUB shows normal gas and stool pattern- not full of stool- likely due to not eating much.  11/13- no BM last night- will order Sorbitol 30cc 1400 to help her have BM- abd appears flat/not distended- soft 11/14- large BM after bowel program last night with Sorbitol 11/15- small BM last night- daughter called and was upset with nursing coordinator asking when can stop bowel program- was told that is lifetime.  20. Poor Appetite 10/22- will add Megace for poor appetite since cannot add Remeron and too sedated to add Periactin. Will also order push fluids since not drinking great- although her BUN?Cr looking better- BUN 25 and Cr 1.08-  10/23- pt feels she's drinking 6+ cups/day- ate muffin and some eggs this AM- better than last 2 days-  10/24- 25% of tray at most- will add Remeron 15 mg at bedtime- ok'd by Cards.  10/25- changed remeron to 7.5 mg QHS 10/30- won't eat anything for breakfast- small appetite overall-  10/31- ate 1 pancake this AM- better than normal 11/11- on full liquid  diet today- can switch back to regular diet tomorrow 11/12- ate grits this AM- put back on regular diet- Hb stable 11/13- ate all eggs and toast 21. Prolonged QT interval 10/24- spoke with Cards- they suggested starting Remeron 15 mg at  bedtime and then rechecking EKG ina few days to see if can add Tramadol. 10/29- doing ok with remeron- wait to add tramadol for now    22. Oversedation/lethargy/ UTI 10/25- septis and ABG work up- when speaking with PA- appeared to have ST elevation- asked them to call IM consult-  -12/19/22 sleepy but arousable today, U/A+, started bactrim as above, awaiting UCx; CT head neg last night, CBC/BMP stable, TSH/T4 WNL, CXR stable; suspect UTI as source. Monitor. See above #16 11/6: alert and bright this morning 11/7 -Patient alert and oriented x 4, following commands.  Vital signs stable.  Will recheck lab work and UA culture.  Overall she appears stable this morning although fatigue may be waxing and waning. 11/8 patient was restarted on Bactrim last night for suspected UTI, monitor renal function  11/12- BUN/Cr looking OK off bactrim 23. L radial pulse nonpalpable but dopplerable, BPs different in that arm-- unclear when this started, had some pain in L deltoid area yesterday but seemed to be better today. Suspect this pulse/BP discrepancy is 2/2 her vascular issue already, but reached out to vascular surgery to verify. Dr. Karin Lieu returned page, stated this is known since her surgery; nothing to do or be concerned about.   24. Pulsatile abdominal aorta: not noticed previously, not mentioned in notes, but today 12/20/22 I noticed it was very prominently pulsatile and pt reported it being sore in that area; spoke with Dr. Karin Lieu again of [facility], recommended getting CTA Abd/pelv to evaluate for AAA. 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 Bradycardia -11/7 patient is being followed by  cardiology.  Metoprolol has been stopped and ASA duration was recommended-ASA has been stopped.  -11/8 heart rate doing better today  11/12- HR doing much better- in 80s now 26. Stuffy nose  11/15- added Flonase and saline nasal spray as needed 27. Poor cough  11/15- educated pt ton quad coughing- she felt it could help.    I spent a total of 39   minutes on total care today- >50% coordination of care- due to d/w SW as well as nursing coordinator about pt's  daughter and concerns on floor- advised she can leave Monday or Tuesday but if leaves Monday, cannot get CTA done before d/c.    LOS: 27 days A FACE TO FACE EVALUATION WAS PERFORMED  Serin Thornell 01/08/2023, 12:43 PM

## 2023-01-08 NOTE — Progress Notes (Signed)
IP Rehab Bowel Program Documentation   Bowel Program Start time 2015  Dig Stim Indicated? Yes  Dig Stim Prior to Suppository or mini Enema X 1   Output from dig stim: none   Ordered intervention: Suppository Yes , mini enema No ,   Repeat dig stim after Suppository or Mini enema  X 2,  Output? Minimal   Bowel Program Complete? Yes , handoff given   Patient Tolerated? Yes

## 2023-01-09 DIAGNOSIS — K5900 Constipation, unspecified: Secondary | ICD-10-CM

## 2023-01-09 NOTE — Progress Notes (Signed)
Occupational Therapy Session Note  Patient Details  Name: Heather Molina MRN: [id number] Date of Birth: Jul 23, 1940  Today's Date: 01/09/2023 OT Individual Time: 4098-1191 OT Individual Time Calculation (min): 56 min    Short Term Goals: Week 4:  OT Short Term Goal 1 (Week 4): STG=LTG d/t ELOS  Skilled Therapeutic Interventions/Progress Updates:  Pt greeted seated in w/c, pt agreeable to OT intervention but c/o pain wanting to return to bed.       Transfers/bed mobility: pt completed SB transfer back to bed to R side with total A for board placement and light mIN A to scoot to EOB. Pt completed sit>supine with MODA to elevate BLEs back to bed. Worked on maneuvering BLEs in bed using leg loops with pt ultimately requiring MOD A to position BLEs.   Therapeutic activity: pt engaged in below therapeutic activities with pt positioned in bed from semi reclined position:  -pt using 2lb weighted bar to toss beach ball back and forth to facilitate improved global endurance and strength. Pt needed 30 sec rest break after 2 mins.  -pt engaged in ball tosses back and forth for 3 mins to improve BUE strength/endurance, graded task up and had pt complete OH press prior to tossing ball to increase GM movement/strength -worked on anterior weight shifting from long sitting in bed for ADLs with pt instructed to use LUE on bed rail to shift trunk forward while RUE reached forward to table to engage in "barrel of monkeys" game. Pt needed MODA to maintain anterior weight shift in trunk without leaning to L side.   Education: education provided on below topics:  - pt able to state correct pressure relief schedule, education provided on various pressure relief strategies from manual chair -discussed using bed features to assist with bathing/dressing from bed level, I.e using rails to shift trunk forward, dressing from supported circle sitting  Ended session with pt supine in bed with all needs within reach.               Pt on 2L during session with SpO2 99% at end of session    Therapy Documentation Precautions:  Precautions Precautions: Fall Precaution Comments: paraplegia Restrictions Weight Bearing Restrictions: No  Pain: 2/10 pain reported in back, pain meds provided during session with rest breaks utilized as needed.    Therapy/Group: Individual Therapy  Barron Schmid 01/09/2023, 12:10 PM

## 2023-01-09 NOTE — Progress Notes (Signed)
PROGRESS NOTE   Subjective/Complaints:  Pt doing ok, slept well, denies pain currently, unsure of LBM but last documented one was on 01/07/23. Still needing caths, no issues with that. Denies any other complaints or concerns today.   ROS:    Pt denies SOB, abd pain, CP, N/V/D, and vision changes   Except for HPI  Objective:   No results found. No results for input(s): "WBC", "HGB", "HCT", "PLT" in the last 72 hours.    No results for input(s): "NA", "K", "CL", "CO2", "GLUCOSE", "BUN", "CREATININE", "CALCIUM" in the last 72 hours.       Intake/Output Summary (Last 24 hours) at 01/09/2023 1154 Last data filed at 01/09/2023 0736 Gross per 24 hour  Intake 910 ml  Output 2200 ml  Net -1290 ml     Pressure Injury 12/12/22 Sacrum Medial Deep Tissue Pressure Injury - Purple or maroon localized area of discolored intact skin or blood-filled blister due to damage of underlying soft tissue from pressure and/or shear. (Active)  12/12/22 1844  Location: Sacrum  Location Orientation: Medial  Staging: Deep Tissue Pressure Injury - Purple or maroon localized area of discolored intact skin or blood-filled blister due to damage of underlying soft tissue from pressure and/or shear.  Wound Description (Comments):   Present on Admission: Yes     Pressure Injury 12/14/22 Heel Left;Lateral;Posterior Stage 2 -  Partial thickness loss of dermis presenting as a shallow open injury with a red, pink wound bed without slough. (Active)  12/14/22 (first assessed by [facility]) 1221  Location: Heel  Location Orientation: Left;Lateral;Posterior  Staging: Stage 2 -  Partial thickness loss of dermis presenting as a shallow open injury with a red, pink wound bed without slough.  Wound Description (Comments):   Present on Admission: No     Pressure Injury 12/14/22 Heel Right;Lateral Stage 2 -  Partial thickness loss of dermis presenting as a shallow  open injury with a red, pink wound bed without slough. (Active)  12/14/22 (first assessed by [facility]) 1221  Location: Heel  Location Orientation: Right;Lateral  Staging: Stage 2 -  Partial thickness loss of dermis presenting as a shallow open injury with a red, pink wound bed without slough.  Wound Description (Comments):   Present on Admission: No     Pressure Injury 12/14/22 Ischial tuberosity Left;Medial Stage 2 -  Partial thickness loss of dermis presenting as a shallow open injury with a red, pink wound bed without slough. blister (Active)  12/14/22 (first assessed by [facility] nurse) 1221  Location: Ischial tuberosity  Location Orientation: Left;Medial  Staging: Stage 2 -  Partial thickness loss of dermis presenting as a shallow open injury with a red, pink wound bed without slough.  Wound Description (Comments): blister  Present on Admission: No     Physical Exam: Vital Signs Blood pressure (!) 134/54, pulse 87, temperature 98.1 F (36.7 C), resp. rate 17, height 5' (1.524 m), weight 50.5 kg, SpO2 100%.    General: awake, alert, appropriate, frail- sitting up working with PT, in w/c; NAD HENT: conjugate gaze; oropharynx a little dry- O2 in place CV: regular rate- irregular;  no JVD Pulmonary: CTA B/L; no W/R/R-  GI:  soft, NT, ND, (+)BS Psychiatric: appropriate- in good spirits Neurological: Ox3  Prior exams MS; was able to move RLE< but from abd muscles- not in LE muscles- no movement seen in LLE either-  Skin: Groin incision site looks okay, bruising noted bilateral shins 2 IVs in place left upper extremity looks okay Extremities: unable to palpate L radial pulse but dopplers confirm pulse with rate in 60-70s, arm well perfused appearing, warm and pink, motor function preserved. Brachial pulse also unable to be felt. R radial pulse strong and palpable.    Neuro:   Mental Status: AAOx3, memory intact,  Speech/Languate:  fluent, follows simple commands CRANIAL NERVES: II:  PERRL. Visual fields full III, IV, VI: EOM intact, no gaze preference or deviation V: normal sensation bilaterally VII: no asymmetry VIII: normal hearing to speech IX, X: normal palatal elevation XI: 5/5 head turn and 5/5 shoulder shrug bilaterally XII: Tongue midline     MOTOR: RUE: 5/5 Deltoid, 5/5 Biceps, 5/5 Triceps,5/5 Grip LUE: 5/5 Deltoid, 5/5 Biceps, 5/5 Triceps, 5/5 Grip RLE: 0 out of 5 throughout LLE: 0 out of 5 throughout, exam stable 11/6   SENSORY: Normal to touch all 4 extremities to light touch in bilateral upper and lower extremities, patchy altered sensation to cold in lower extremities   No ankle clonus bilaterally   Coordination: Normal finger to nose, no dysmetria  Assessment/Plan: 1. Functional deficits which require 3+ hours per day of interdisciplinary therapy in a comprehensive inpatient rehab setting. Physiatrist is providing close team supervision and 24 hour management of active medical problems listed below. Physiatrist and rehab team continue to assess barriers to discharge/monitor patient progress toward functional and medical goals  Care Tool:  Bathing    Body parts bathed by patient: Right arm, Left arm, Chest, Abdomen, Front perineal area, Face, Right upper leg, Left upper leg   Body parts bathed by helper: Buttocks, Right lower leg, Left lower leg     Bathing assist Assist Level: Minimal Assistance - Patient > 75%     Upper Body Dressing/Undressing Upper body dressing   What is the patient wearing?: Pull over shirt    Upper body assist Assist Level: Set up assist    Lower Body Dressing/Undressing Lower body dressing      What is the patient wearing?: Pants, Incontinence brief     Lower body assist Assist for lower body dressing: Maximal Assistance - Patient 25 - 49%     Toileting Toileting    Toileting assist Assist for toileting: Total Assistance - Patient < 25%     Transfers Chair/bed transfer  Transfers assist   Chair/bed transfer activity did not occur: Safety/medical concerns  Chair/bed transfer assist level: Moderate Assistance - Patient 50 - 74%     Locomotion Ambulation   Ambulation assist   Ambulation activity did not occur: Safety/medical concerns (paraplegia, fatigue)          Walk 10 feet activity   Assist  Walk 10 feet activity did not occur: Safety/medical concerns (paraplegia, fatigue)        Walk 50 feet activity   Assist Walk 50 feet with 2 turns activity did not occur: Safety/medical concerns (paraplegia, fatigue)         Walk 150 feet activity   Assist Walk 150 feet activity did not occur: Safety/medical concerns (paraplegia, fatigue)         Walk 10 feet on uneven surface  activity   Assist Walk 10 feet on uneven surfaces activity did not  occur: Safety/medical concerns (paraplegia, fatigue)         Wheelchair     Assist Is the patient using a wheelchair?: Yes Type of Wheelchair: Power Wheelchair activity did not occur: Safety/medical concerns (paraplegia, fatigue)  Wheelchair assist level: Contact Guard/Touching assist Max wheelchair distance: 200    Wheelchair 50 feet with 2 turns activity    Assist    Wheelchair 50 feet with 2 turns activity did not occur: Safety/medical concerns (paraplegia, fatigue)   Assist Level: Contact Guard/Touching assist   Wheelchair 150 feet activity     Assist  Wheelchair 150 feet activity did not occur: Safety/medical concerns (paraplegia, fatigue)   Assist Level: Contact Guard/Touching assist   Blood pressure (!) 134/54, pulse 87, temperature 98.1 F (36.7 C), resp. rate 17, height 5' (1.524 m), weight 50.5 kg, SpO2 100%.  Medical Problem List and Plan: 1. 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 Per [facility], needs to be done Monday and not earlier-  2.  Antithrombotics: -DVT/anticoagulation:  Pharmaceutical: Eliquis 2.5mg  BID-- on hold for EGD 01/04/23 11/11- can be restarted in 2 days at 2.5 mg BID per GI-  11/13- restarted Elqiuis -antiplatelet therapy: Aspirin 81 mg daily- discontinued per cardiology recommendation    3. Pain Management: continue Tylenol, robaxin, oxycodone as needed 10/24- will wait to try Tramadol for pain/discomfort til have tried  Remeron 7.5mg  for sleep 10/31- stopped Remeron 4. Mood/Behavior/Sleep: LCSW to evaluate and provide emotional support             -continue paroxetine 20 mg daily (hx of depression)             -antipsychotic agents: n/a -12/13/22 hasn't been sleeping well, increase melatonin to 10mg  QHS, added Trazodone 25mg  PRN but advised to be cautious about using it too late.  10/21- will stop Trazodone- can impair ability to pee- will start Restoril 7.5 mg at bedtime for sleep.  10/22- cannot start Remeron due to risk of torsades de pointes- will con't Restoril  10/23- didn't sleep well again- will increase Restoril to 15 mg at bedtime 10/24- spoke to Cards- they are ok trying Remeron but wait on Tramadol-will start 15 mg at bedtime -will stop Restoril>> down to 7.5mg  -12/19/22 slept "wonderfully"! Cont regimen 10/28- Slept poorly- still on rmeron- sounds like every other night she slept well 10/30- sleeping better 10/31- stopped remeron- slept ok yesterday as well  01/02/23 not sleeping well per patient, but d/t bed; monitor for now 11/11- sleeping "as well as can".  5. Neuropsych/cognition: This patient is capable of making decisions on her own behalf.   6. Skin/Wound Care: Routine skin care checks   7. Fluids/Electrolytes/Nutrition: Routine Is and Os and follow-up chemistries  -01/02/23 BUN down to 27 this  morning, Cr 0.92, monitor routinely   11/11- BUN down to 21 from 27 and Cr 1.00- doing better 8: Hypertension: monitor TID and prn (hx of a.fib see meds below). Monitor with mobility. Continue metoprolol 25mg  BID  Reduced Metoprolol per pt request 10/24- will add Norvasc 2.5 mg daily since BP elevated and not coming  down  10/25- calling IM- maybe they can help- just added Norvasc yesterday -12/19/22 BPs improving a little this morning, monitor -12/20/22 BPs still a tad elevated but stable, monitor a couple more days before deciding on changes for now 10/28- BP on low side, but Hb low- will transfuse 2 units 10/29- BP looking more labile- but better than it was- monitor for now 11/1: continue current regimen -12/26/22 BPs variable, monitor trend for now 11/5- BP running 130s- 170's- Will increase Norvasc to 5 mg daily  11/7 BP stable, metoprolol discontinued by cardiology 11/8 BP above goal today.  If continues to be elevated cardiology recommends addition of ARB and would not recommend further titration of amlodipine. Will hold off on change for now as would like to avoid hypotension -11/9-10/24 BPs a bit better, cont regimen  11/11- BP doing OK- intermittently in 140's- con't regimen  11/12-11/13 BP in 140s- but holding in low to mid 140s-   11/14- BP running 120s to 140s- off B blocker- family does not want her on one. 11/15- BP running 120s-140s systolic- con't regimen  -01/09/23 BPs mostly stable, cont regimen Vitals:   01/06/23 1432 01/06/23 1913 01/07/23 0528 01/07/23 0806  BP: (!) 162/55 (!) 140/53 (!) 131/58 (!) 121/44   01/07/23 1257 01/07/23 1307 01/07/23 2020 01/08/23 0605  BP: (!) 136/44 (!) 133/50 (!) 128/51 (!) 145/52   01/08/23 1401 01/08/23 1909 01/09/23 0624 01/09/23 0808  BP: (!) 150/58 (!) 153/56 (!) 140/63 (!) 134/54    9: Hyperlipidemia: continue pravastatin 40mg  daily   10: COPD, O2 dependent 2L via [place]:             -continue Brovana nebs BID              -continue Yupelri neb daily   10/21- Has been on 2L O2 at home per pt and chart  10/25- ABG shows CO2 too low- needs to reduce O2 levels  10/31- Sats ok- con't O2  11/15- stable 11: CAD: continue asa and statin   12: Atrial fibrillation, paroxysmal: maintained on Eliquis reversed pre-operatively             -continue amiodarone 100 mg daily             -continue metoprolol tartrate 25 mg BID             -cleared by [facility] to re-start Eliquis>pharmacy consult placed   10/25- reduced Metoprolol to 12.5 mg BID earlier in week - doing better  10/30- BP doing better 11/8 cardiology does not feel she is currently in A-fib, they recommend continue amiodarone 100 mg daily and avoid nodal agents, continue Eliquis  11/11- held Eliquis due to GI bleed- can restart 11/13  11/13- will restart Eliquis 2.5 mg BID 13: 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 10/22-  not voiding -U/A (-)  will add Flomax 0.4 mg q supper to see if she can void- I don't think it's likely. D/w nursing due to low amount of caths-  -12/19/22 U/A overnight looks infectious, will start Bactrim DS BID x5d, UCx pending; monitor -12/20/22 UCx with >100k CFU of K. Pneumoniae, sensitive to bactrim, cont abx course 10/29- Being cathed- volumes low- pushing fluids, but will also give 1 L IVFs 10/30- not drinking- Got IVFs but BUN up to 45 and Cr 1.41-  will call renal- and recheck in AM-- a little improved on 10/31 labs 11/5- drinking much better- volumes looking better- and Cr down to 1.16 and BUN 29 from 40's 11/11- Volumes on caths doing better 11/12- Cath volumes looking good 11/14- caths going ok- explained/educated pt/family about if starts having a lot of leakage between caths, will need to start Gemtesa vs Oxybutynin - 11/15-16 Bladder cath going well  17: ABLA: stable; follow CBC trend             -consider oral iron supplement -12/13/22 Hgb slowly downtrending from postop 8.8 on 10/14>7.6>7.5>7.3>7.0 yesterday; pt asymptomatic at this time; would recheck tomorrow but if still at the 7.0-range then would consider transfusion to help with therapy tolerance; unclear what her prior baseline was (has hgb 12.2 back in July but then down to 9's).    10/21- Hb 7.4 today  10/23- Hb 7.2 yesterday- will check in AM 10/24- Hb down to 7.0- will d/w pt if she wants to be transfused. If so, will give 1 unit pRBCs.  Doesn't have location of losing blood- will check FOBT x3  10/25- Hb 8.1- likely too dry  10/28- Hb 6.7- will transfuse 2 units 10/29- Hb much better AND pt feels MUCH better per pt- Hb up to 9.9- likely so high since pt really dry- will also rewrite for FOBT- hasn't been done  10/30- no bowel results- so cannot check FOBT yet.  10/31- had 2 extra large Bms last night- didn't check FOBT_ will order again. Will d/w nursing 11/4: Hgb reviewed and is stable 11/5- Hb 9.2 11/8 HGB down to 8.7 yesterday, FOBT postive, has had multiple dark stools recovered, will contact GI, continue PPI -GI recommends to hold Eliquis per note.  Discussed with Dr. Vivianne Master discontinue for now.  Eliquis stopped.  Appreciate GI/cardiology assistance -01/02/23 Hgb stable at 8.2 today, monitor -01/03/23 Hgb down to 8.0; transfuse to keep hgb >7 per GI notes; plan for EGD tomorrow; per notes, PPI IV but PO currently, asked GI and they said PO is fine. Will leave  order in place.  11/11- Hb 8.4- will recheck in AM after EGD and see if going up at all- argon used for lesion- might also have esophageal candida- waiting on biopsy results-  -on PPI BID and Carafate QID for 2 weeks per GI- they signed off 11/12- looking at Surgical pathology- doesn' thave fungal elements- so unlikely to be Fungus/thrush- Hb 8.5 11.13- no Thrush/candidada esophagitis per GI 18: AKI: improving; CMP 12/12/22 showing Cr 1.31; monitor Monday BMPs 10/21- Cr 1.09 down from 1.31 and BUN 25 down from 25- however is drinking poorly and has poor output- 10/23- Says drinking better- will check BMP in AM  10/25- Cr 0.9 and BUN slightly high at 22- but based on Hb increase, likely dry 10/28- Cr 1.4 and BUN elevated- will give blood and recheck BMP in AM 10/29- BUN up to 40 and Cr 1.41- will need to give IVFs- tried seeing  if blood would help, but still needs fluids- will her BUN/Cr- will replete 75 cc/hour after therapy today and recheck BMP in AM- of note, her Cr was running ~ 1.0 and her BUN ~ 25- so big change- will push fluids as well- will give 75cc/hour for 1 liter  10/31- likely form Septra- will stop ABX-  11/4: Cr reviewed and is improving 11/5- Cr 1.16 and BUN 29 down from 40's 11/8 patient was given IV fluids yesterday for elevated BUN, recheck labs today -01/02/23 BUN down to 27, Cr 0.92, monitor 11/11- BUN 21 and Cr 1.0- doing better 19: neurogenic bowel: bowel program ordered -Dulcolax suppository ordered -12/13/22 per pt LBM yesterday, monitor 10/21- LBM this afternoon/AM- incontinent- educated pt NEEDs bowel program since not having Bms at the time to avoid therapies- going when doesn't want to- this will train gut to go when we wants her to go.  10/22- continue to refuse bowel program-in spite of education of bowel program and need for it.  10/23- pt did last night- had BM with bowel program and one this AM, but educated can take 3-6 weeks to train gut, but should be  better- had bowel incontinence for 20 years 10/24- no bowel program documented  but had small BM's at midnight and 5am- said wasn't incontinent?will check on this 10/25- asked pt to get more dig stim this evening since likely cause of bowel incontinent in AM -12/20/22 reports BM this morning but no BM since 12/17/22 documented, no report to nursing staff given; monitor closely but if no BM by tomorrow may need to consider additional adjustments 10/29- LBM yesterday AM, and small with bowel program 10/27-no results last night- if no BM tonight, will give sorbitol/check KUB  10/30- no significant BM for 3 days- will give Sorbitol 30cc at 2pm for bowel program- also add Senna 1 tab daily for meds- 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  -LBM 11/3, continue current regimen Reviewed chart and had BM 11/5 -01/03/23 no further BMs since 11/8, just mucus; see GI plan above 11/11- still having mucus- will order KUB 11/12- Small output last night- KUB shows normal gas and stool pattern- not full of stool- likely due to not eating much.  11/13- no BM last night- will order Sorbitol 30cc 1400 to help her have BM- abd appears flat/not distended- soft 11/14- large BM after bowel program last night with Sorbitol 11/15- small BM last night- daughter called and was upset with nursing coordinator asking when can stop bowel program- was told that is lifetime.  01/09/23 no BM since 01/07/23-- will see how today goes, but may need more sorbitol tomorrow if no BM still 20. Poor Appetite 10/22- will add Megace for poor appetite since cannot add Remeron and too sedated to add Periactin. Will also order push fluids since not drinking great- although her BUN?Cr looking better- BUN 25 and Cr 1.08-  10/23- pt feels she's drinking 6+ cups/day- ate muffin and some eggs this AM- better than last 2 days-  10/24- 25% of tray at most- will add Remeron 15 mg at bedtime- ok'd by Cards.  10/25- changed remeron to 7.5 mg  QHS 10/30- won't eat anything for breakfast- small appetite overall-  10/31- ate 1 pancake this AM- better than normal 11/11- on full liquid diet today- can switch back to regular diet tomorrow 11/12- ate grits this AM- put back on regular diet- Hb stable 11/13- ate all eggs and toast 21. Prolonged QT interval 10/24-  spoke with Cards- they suggested starting Remeron 15 mg at bedtime and then rechecking EKG ina few days to see if can add Tramadol. 10/29- doing ok with remeron- wait to add tramadol for now    22. Oversedation/lethargy/ UTI 10/25- septis and ABG work up- when speaking with PA- appeared to have ST elevation- asked them to call IM consult-  -12/19/22 sleepy but arousable today, U/A+, started bactrim as above, awaiting UCx; CT head neg last night, CBC/BMP stable, TSH/T4 WNL, CXR stable; suspect UTI as source. Monitor. See above #16 11/6: alert and bright this morning 11/7 -Patient alert and oriented x 4, following commands.  Vital signs stable.  Will recheck lab work and UA culture.  Overall she appears stable this morning although fatigue may be waxing and waning. 11/8 patient was restarted on Bactrim last night for suspected UTI, monitor renal function  11/12- BUN/Cr looking OK off bactrim 23. L radial pulse nonpalpable but dopplerable, BPs different in that arm-- unclear when this started, had some pain in L deltoid area yesterday but seemed to be better today. Suspect this pulse/BP discrepancy is 2/2 her vascular issue already, but reached out to vascular surgery to verify. Dr. Karin Lieu returned page, stated this is known since her surgery; nothing to do or be concerned about.   24. Pulsatile abdominal aorta: not noticed previously, not mentioned in notes, but today 12/20/22 I noticed it was very prominently pulsatile and pt reported it being sore in that area; spoke with Dr. Karin Lieu again of [facility], recommended getting CTA Abd/pelv to evaluate for AAA. 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 Bradycardia -11/7 patient is being followed by cardiology.  Metoprolol has been stopped and ASA duration was recommended-ASA has been stopped.  -11/8 heart rate doing better today  11/12- HR doing much better- in 80s now 26. Stuffy nose  11/15- added Flonase and saline nasal spray as needed 27. Poor cough  11/15- educated pt on quad coughing- she felt it could help.     LOS: 28 days A FACE TO FACE EVALUATION WAS PERFORMED  [address] 01/09/2023, 11:54 AM

## 2023-01-09 NOTE — Progress Notes (Signed)
[facility] Bowel Program Documentation   Bowel Program Start time 2010   Dig Stim Indicated? Yes  Dig Stim Prior to Suppository or mini Enema X 3  Output from dig stim: Small  Ordered intervention: Suppository Yes , mini enema No ,   Repeat dig stim after Suppository or Mini enema  X 3,  Output? None  Bowel Program Complete? Yes , handoff given yes  Patient Tolerated? Yes    Dominica Severin ................................RN

## 2023-01-09 NOTE — Progress Notes (Signed)
Physical Therapy Session Note  Patient Details  Name: Heather Molina MRN: [id number] Date of Birth: 09/13/1940  Today's Date: 01/09/2023 PT Individual Time: [id number] and 4098-1191 PT Individual Time Calculation (min): 60 min and 32 min.  Short Term Goals: Week 3:  PT Short Term Goal 1 (Week 3): STG's = LTG's due to ELOS  Skilled Therapeutic Interventions/Progress Updates:   First session:  Pt presents supine in bed w/ nursing care being given as well as RT.  PT returned in 15' for therapy session.  PT donned TEDS, pants w/ rolling side to side supervision and siderails, and leg loops w/ total A.  Pt performed sidelying to sit transfer w/ min A and time after bringing LES off EOB.  Pt able to lean to R for SB placement.  Pt w/ increased fatigue this AM and required mod A to initiate SB transfer fading to min A and then Mod A for final scoot into w/c.  Pt wheeled self x 50' w/ BUES before fatiguing.  PT completed trip to small gym.  Pt performed 2" x 2 on UBE at Level 1 before c/o L shoulder pain 4/10.  Pt returned to room and remained sitting in w/c w/ chair alarm on and all needs in reach.  Pt demos weight shift for pressure relief.  Missed time of 74' 2/2 nursing care.  Second session:  Pt presents semi-reclined in bed and reluctantly agreeable to therapy, w/ increased fatigue.  Pt performed sup to sit w/ min A and bedrails.  Pt brings LES off EOB.  Pt sat EOB for 1-2" w/o support on air mattress.  Pt performed modified crunches in sitting 2 x 10.  Pt performed forward leans down shins x 5, but states pain in back beginning.  Pt performed sidelean to L elbow and reaching forward w/ R UE, and then to R elbow w/ reaching of L UE.  Pt able to pull self back to midline w/ min A.  Pt required mod A for LES sit to supine.  TEDS, leg loops and pants doffed for comfort.  Pt refuses PRAFOs 2/2 "too hot" but agrees to floating heels on pillow.  All needs in reach.Missed time of 29' 2/2 fatigue.       Therapy  Documentation Precautions:  Precautions Precautions: Fall Precaution Comments: paraplegia Restrictions Weight Bearing Restrictions: No General:   Vital Signs: Therapy Vitals Temp: 98.4 F (36.9 C) Temp Source: Oral Pulse Rate: 85 Resp: 17 BP: (!) 131/44 Patient Position (if appropriate): Sitting Oxygen Therapy SpO2: 100 % O2 Device: Room Air Pain:0/10 initially, 4/10 L shoulder w/ activity. 0/10 in PM.      Therapy/Group: Individual Therapy  Lucio Edward 01/09/2023, 2:06 PM

## 2023-01-10 LAB — OCCULT BLOOD X 1 CARD TO LAB, STOOL: Fecal Occult Bld: POSITIVE — AB

## 2023-01-10 NOTE — Progress Notes (Cosign Needed)
Patient is AAO x4. Currently sitting in bed with daughter at bedside. No bowel so far today but had a BM yesterday. Denies nausea and vomiting. Heart sounds auscultated and S1 and S2 heard. Lungs clears with no wheezing or crackle. Patient is currently on 2L of oxygen via [place]. Patient has no complaints at the moment. Trachea midline with no deviation. Patient is able to swallow with no regurgitation or aspiration. Patient only ate some of her omelet for breakfast with a few sips of coffee. Patient states she was really hungry but after a few bites, she got really full. Does not like to drink ensure but loves the might shakes and will drink those. Patient has sacral dressing, left forearm dressing, and BL heel dressing. Skin tear on left hand is closed with scab formation. Bruising and dry skin noticed on all extremities and scab formation on left anterior lower leg. Skin turgor performed on clavicle with slow return but no tinting noted. Unable to assess pulses on posterior tibialis due to dressing on BL heel. BL pulse felt on dorsalis pedis. Pulse felt on right brachial and right radial but unable to feel pulse on left brachial and left radial. 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 Patient ate 50% of lunch meal and finished the might shake. In and out catheter performed with 300 mL output. Patient is having no pain and does not need pain medication before Physical therapy. States she may need some tylenol after therapy session. Will assess pain after therapy.

## 2023-01-10 NOTE — Progress Notes (Signed)
PROGRESS NOTE   Subjective/Complaints:  Pt doing well, slept ok, pain doing ok, LBM yesterday x2. Still needing caths, no issues with that. Denies any other complaints or concerns today.   ROS:    Pt denies SOB, abd pain, CP, N/V/D, and vision changes   Except for HPI  Objective:   No results found. No results for input(s): "WBC", "HGB", "HCT", "PLT" in the last 72 hours.    No results for input(s): "NA", "K", "CL", "CO2", "GLUCOSE", "BUN", "CREATININE", "CALCIUM" in the last 72 hours.       Intake/Output Summary (Last 24 hours) at 01/10/2023 0952 Last data filed at 01/10/2023 0750 Gross per 24 hour  Intake 360 ml  Output 2150 ml  Net -1790 ml     Pressure Injury 12/12/22 Sacrum Medial Deep Tissue Pressure Injury - Purple or maroon localized area of discolored intact skin or blood-filled blister due to damage of underlying soft tissue from pressure and/or shear. (Active)  12/12/22 1844  Location: Sacrum  Location Orientation: Medial  Staging: Deep Tissue Pressure Injury - Purple or maroon localized area of discolored intact skin or blood-filled blister due to damage of underlying soft tissue from pressure and/or shear.  Wound Description (Comments):   Present on Admission: Yes     Pressure Injury 12/14/22 Heel Left;Lateral;Posterior Stage 2 -  Partial thickness loss of dermis presenting as a shallow open injury with a red, pink wound bed without slough. (Active)  12/14/22 (first assessed by [facility]) 1221  Location: Heel  Location Orientation: Left;Lateral;Posterior  Staging: Stage 2 -  Partial thickness loss of dermis presenting as a shallow open injury with a red, pink wound bed without slough.  Wound Description (Comments):   Present on Admission: No     Pressure Injury 12/14/22 Heel Right;Lateral Stage 2 -  Partial thickness loss of dermis presenting as a shallow open injury with a red, pink wound bed without  slough. (Active)  12/14/22 (first assessed by [facility]) 1221  Location: Heel  Location Orientation: Right;Lateral  Staging: Stage 2 -  Partial thickness loss of dermis presenting as a shallow open injury with a red, pink wound bed without slough.  Wound Description (Comments):   Present on Admission: No     Pressure Injury 12/14/22 Ischial tuberosity Left;Medial Stage 2 -  Partial thickness loss of dermis presenting as a shallow open injury with a red, pink wound bed without slough. blister (Active)  12/14/22 (first assessed by [facility] nurse) 1221  Location: Ischial tuberosity  Location Orientation: Left;Medial  Staging: Stage 2 -  Partial thickness loss of dermis presenting as a shallow open injury with a red, pink wound bed without slough.  Wound Description (Comments): blister  Present on Admission: No     Physical Exam: Vital Signs Blood pressure (!) 128/55, pulse 84, temperature 98.2 F (36.8 C), resp. rate 17, height 5' (1.524 m), weight 50.5 kg, SpO2 98%.    General: awake, alert, appropriate, frail- sitting up in bed taking meds; NAD HENT: conjugate gaze; oropharynx a little dry- O2 in place CV: regular rate- irregular;  no JVD Pulmonary: CTA B/L; no W/R/R-  GI: soft, NT, ND, (+)BS Psychiatric: appropriate- in good  spirits Neurological: Ox3  Prior exams MS; was able to move RLE< but from abd muscles- not in LE muscles- no movement seen in LLE either-  Skin: Groin incision site looks okay, bruising noted bilateral shins 2 IVs in place left upper extremity looks okay Extremities: unable to palpate L radial pulse but dopplers confirm pulse with rate in 60-70s, arm well perfused appearing, warm and pink, motor function preserved. Brachial pulse also unable to be felt. R radial pulse strong and palpable.    Neuro:   Mental Status: AAOx3, memory intact,  Speech/Languate:  fluent, follows simple commands CRANIAL NERVES: II: PERRL. Visual fields full III, IV, VI: EOM intact, no  gaze preference or deviation V: normal sensation bilaterally VII: no asymmetry VIII: normal hearing to speech IX, X: normal palatal elevation XI: 5/5 head turn and 5/5 shoulder shrug bilaterally XII: Tongue midline     MOTOR: RUE: 5/5 Deltoid, 5/5 Biceps, 5/5 Triceps,5/5 Grip LUE: 5/5 Deltoid, 5/5 Biceps, 5/5 Triceps, 5/5 Grip RLE: 0 out of 5 throughout LLE: 0 out of 5 throughout, exam stable 11/6   SENSORY: Normal to touch all 4 extremities to light touch in bilateral upper and lower extremities, patchy altered sensation to cold in lower extremities   No ankle clonus bilaterally   Coordination: Normal finger to nose, no dysmetria  Assessment/Plan: 1. Functional deficits which require 3+ hours per day of interdisciplinary therapy in a comprehensive inpatient rehab setting. Physiatrist is providing close team supervision and 24 hour management of active medical problems listed below. Physiatrist and rehab team continue to assess barriers to discharge/monitor patient progress toward functional and medical goals  Care Tool:  Bathing    Body parts bathed by patient: Right arm, Left arm, Chest, Abdomen, Front perineal area, Face, Right upper leg, Left upper leg   Body parts bathed by helper: Buttocks, Right lower leg, Left lower leg     Bathing assist Assist Level: Minimal Assistance - Patient > 75%     Upper Body Dressing/Undressing Upper body dressing   What is the patient wearing?: Pull over shirt    Upper body assist Assist Level: Set up assist    Lower Body Dressing/Undressing Lower body dressing      What is the patient wearing?: Pants, Incontinence brief     Lower body assist Assist for lower body dressing: Maximal Assistance - Patient 25 - 49%     Toileting Toileting    Toileting assist Assist for toileting: Total Assistance - Patient < 25%     Transfers Chair/bed transfer  Transfers assist  Chair/bed transfer activity did not occur: Safety/medical  concerns  Chair/bed transfer assist level: Minimal Assistance - Patient > 75% (SB tranfser w/c>EOB)     Locomotion Ambulation   Ambulation assist   Ambulation activity did not occur: Safety/medical concerns (paraplegia, fatigue)          Walk 10 feet activity   Assist  Walk 10 feet activity did not occur: Safety/medical concerns (paraplegia, fatigue)        Walk 50 feet activity   Assist Walk 50 feet with 2 turns activity did not occur: Safety/medical concerns (paraplegia, fatigue)         Walk 150 feet activity   Assist Walk 150 feet activity did not occur: Safety/medical concerns (paraplegia, fatigue)         Walk 10 feet on uneven surface  activity   Assist Walk 10 feet on uneven surfaces activity did not occur: Safety/medical concerns (paraplegia, fatigue)  Wheelchair     Assist Is the patient using a wheelchair?: Yes Type of Wheelchair: Manual Wheelchair activity did not occur: Safety/medical concerns (paraplegia, fatigue)  Wheelchair assist level: Contact Guard/Touching assist Max wheelchair distance: 50    Wheelchair 50 feet with 2 turns activity    Assist    Wheelchair 50 feet with 2 turns activity did not occur: Safety/medical concerns (paraplegia, fatigue)   Assist Level: Contact Guard/Touching assist   Wheelchair 150 feet activity     Assist  Wheelchair 150 feet activity did not occur: Safety/medical concerns (paraplegia, fatigue)   Assist Level: Maximal Assistance - Patient 25 - 49%   Blood pressure (!) 128/55, pulse 84, temperature 98.2 F (36.8 C), resp. rate 17, height 5' (1.524 m), weight 50.5 kg, SpO2 98%.  Medical Problem List and Plan: 1. 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 Per [facility], needs to be done Monday and not earlier-  2.  Antithrombotics: -DVT/anticoagulation:  Pharmaceutical: Eliquis 2.5mg  BID-- on hold for EGD 01/04/23 11/11- can be restarted in 2 days at 2.5 mg BID per GI-  11/13- restarted Elqiuis -antiplatelet therapy: Aspirin 81 mg daily- discontinued per cardiology recommendation    3. Pain Management: continue Tylenol, robaxin, oxycodone as needed 10/24- will wait to try Tramadol for pain/discomfort til have tried  Remeron 7.5mg  for sleep 10/31- stopped Remeron 4. Mood/Behavior/Sleep: LCSW to evaluate and provide emotional support             -continue paroxetine 20 mg daily (hx of depression)             -antipsychotic agents: n/a -12/13/22 hasn't been sleeping well, increase melatonin to 10mg  QHS, added Trazodone 25mg  PRN but advised to be cautious about using it too late.  10/21- will stop Trazodone- can impair ability to pee- will start Restoril 7.5 mg at bedtime for sleep.  10/22- cannot start Remeron due to risk of torsades de pointes- will con't Restoril  10/23- didn't sleep well again- will increase Restoril to 15 mg at bedtime 10/24- spoke to Cards- they are ok trying Remeron but wait on Tramadol-will start 15 mg at bedtime -will stop Restoril>> down to 7.5mg  -12/19/22 slept "wonderfully"! Cont regimen 10/28- Slept poorly- still on rmeron- sounds like every other night she slept well 10/30- sleeping better 10/31- stopped remeron- slept ok yesterday as well  01/02/23 not sleeping well per patient, but d/t bed; monitor for now 11/11- sleeping "as well as can".  5. Neuropsych/cognition: This patient is capable of making decisions on her own behalf.   6. Skin/Wound Care: Routine skin care checks   7. Fluids/Electrolytes/Nutrition: Routine Is and Os and follow-up chemistries  -01/02/23 BUN down to 27 this morning, Cr 0.92, monitor routinely    11/11- BUN down to 21 from 27 and Cr 1.00- doing better 8: Hypertension: monitor TID and prn (hx of a.fib see meds below). Monitor with mobility. Continue metoprolol 25mg  BID  Reduced Metoprolol per pt request 10/24- will add Norvasc 2.5 mg daily since BP elevated and not coming down  10/25- calling IM- maybe they can help-  just added Norvasc yesterday -12/19/22 BPs improving a little this morning, monitor -12/20/22 BPs still a tad elevated but stable, monitor a couple more days before deciding on changes for now 10/28- BP on low side, but Hb low- will transfuse 2 units 10/29- BP looking more labile- but better than it was- monitor for now 11/1: continue current regimen -12/26/22 BPs variable, monitor trend for now 11/5- BP running 130s- 170's- Will increase Norvasc to 5 mg daily  11/7 BP stable, metoprolol discontinued by cardiology 11/8 BP above goal today.  If continues to be elevated cardiology recommends addition of ARB and would not recommend further titration of amlodipine. Will hold off on change for now as would like to avoid hypotension -11/9-10/24 BPs a bit better, cont regimen  11/11- BP doing OK- intermittently in 140's- con't regimen  11/12-11/13 BP in 140s- but holding in low to mid 140s-  11/14- BP running 120s to 140s- off B blocker- family does not want her on one. 11/15- BP running 120s-140s systolic- con't regimen  -11/16-17/24 BPs mostly stable, cont regimen Vitals:   01/07/23 0806 01/07/23 1257 01/07/23 1307 01/07/23 2020  BP: (!) 121/44 (!) 136/44 (!) 133/50 (!) 128/51   01/08/23 0605 01/08/23 1401 01/08/23 1909 01/09/23 0624  BP: (!) 145/52 (!) 150/58 (!) 153/56 (!) 140/63   01/09/23 0808 01/09/23 1337 01/09/23 1950 01/10/23 0826  BP: (!) 134/54 (!) 131/44 (!) 149/54 (!) 128/55    9: Hyperlipidemia: continue pravastatin 40mg  daily   10: COPD, O2 dependent 2L via Pinch:             -continue Brovana nebs BID             -continue Yupelri neb daily   10/21- Has  been on 2L O2 at home per pt and chart  10/25- ABG shows CO2 too low- needs to reduce O2 levels  10/31- Sats ok- con't O2  11/15- stable 11: CAD: continue asa and statin   12: Atrial fibrillation, paroxysmal: maintained on Eliquis reversed pre-operatively             -continue amiodarone 100 mg daily             -continue metoprolol tartrate 25 mg BID             -cleared by [facility] to re-start Eliquis>pharmacy consult placed   10/25- reduced Metoprolol to 12.5 mg BID earlier in week - doing better  10/30- BP doing better 11/8 cardiology does not feel she is currently in A-fib, they recommend continue amiodarone 100 mg daily and avoid nodal agents, continue Eliquis  11/11- held Eliquis due to GI bleed- can restart 11/13  11/13- will restart Eliquis 2.5 mg BID 13: 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 10/22- not voiding -U/A (-)  will add Flomax 0.4 mg  q supper to see if she can void- I don't think it's likely. D/w nursing due to low amount of caths-  -12/19/22 U/A overnight looks infectious, will start Bactrim DS BID x5d, UCx pending; monitor -12/20/22 UCx with >100k CFU of K. Pneumoniae, sensitive to bactrim, cont abx course 10/29- Being cathed- volumes low- pushing fluids, but will also give 1 L IVFs 10/30- not drinking- Got IVFs but BUN up to 45 and Cr 1.41- will call renal- and recheck in AM-- a  little improved on 10/31 labs 11/5- drinking much better- volumes looking better- and Cr down to 1.16 and BUN 29 from 40's 11/11- Volumes on caths doing better 11/12- Cath volumes looking good 11/14- caths going ok- explained/educated pt/family about if starts having a lot of leakage between caths, will need to start Gemtesa vs Oxybutynin - 11/15-17 Bladder cath going well  17: ABLA: stable; follow CBC trend             -consider oral iron supplement -12/13/22 Hgb slowly downtrending from postop 8.8 on 10/14>7.6>7.5>7.3>7.0 yesterday; pt asymptomatic at this time; would recheck tomorrow but if still at the 7.0-range then would consider transfusion to help with therapy tolerance; unclear what her prior baseline was (has hgb 12.2 back in July but then down to 9's).    10/21- Hb 7.4 today  10/23- Hb 7.2 yesterday- will check in AM 10/24- Hb down to 7.0- will d/w pt if she wants to be transfused. If so, will give 1 unit pRBCs.  Doesn't have location of losing blood- will check FOBT x3  10/25- Hb 8.1- likely too dry  10/28- Hb 6.7- will transfuse 2 units 10/29- Hb much better AND pt feels MUCH better per pt- Hb up to 9.9- likely so high since pt really dry- will also rewrite for FOBT- hasn't been done  10/30- no bowel results- so cannot check FOBT yet.  10/31- had 2 extra large Bms last night- didn't check FOBT_ will order again. Will d/w nursing 11/4: Hgb reviewed and is stable 11/5- Hb 9.2 11/8 HGB down to 8.7 yesterday, FOBT postive, has had multiple dark stools recovered, will contact GI, continue PPI -GI recommends to hold Eliquis per note.  Discussed with Dr. Vivianne Master discontinue for now.  Eliquis stopped.  Appreciate GI/cardiology assistance -01/02/23 Hgb stable at 8.2 today, monitor -01/03/23 Hgb down to 8.0; transfuse to keep hgb >7 per GI notes; plan for EGD tomorrow; per notes, PPI IV but PO currently, asked GI and they said PO is fine. Will leave order in place.  11/11- Hb 8.4- will  recheck in AM after EGD and see if going up at all- argon used for lesion- might also have esophageal candida- waiting on biopsy results-  -on PPI BID and Carafate QID for 2 weeks per GI- they signed off 11/12- looking at Surgical pathology- doesn' thave fungal elements- so unlikely to be Fungus/thrush- Hb 8.5 11.13- no Thrush/candidada esophagitis per GI  -01/10/23 FOBT+ still, but doubt we need to continue these, d/c'd order  18: AKI: improving; CMP 12/12/22 showing Cr 1.31; monitor Monday BMPs 10/21- Cr 1.09 down from 1.31 and BUN 25 down from 25- however is drinking poorly and has poor output- 10/23- Says drinking better- will check BMP in AM  10/25- Cr 0.9 and BUN slightly high at 22- but based on Hb increase, likely dry 10/28- Cr 1.4 and BUN elevated- will give blood and recheck BMP in AM 10/29- BUN up to 40 and Cr 1.41- will need to  give IVFs- tried seeing if blood would help, but still needs fluids- will her BUN/Cr- will replete 75 cc/hour after therapy today and recheck BMP in AM- of note, her Cr was running ~ 1.0 and her BUN ~ 25- so big change- will push fluids as well- will give 75cc/hour for 1 liter  10/31- likely form Septra- will stop ABX-  11/4: Cr reviewed and is improving 11/5- Cr 1.16 and BUN 29 down from 40's 11/8 patient was given IV fluids yesterday for elevated BUN, recheck labs today -01/02/23 BUN down to 27, Cr 0.92, monitor 11/11- BUN 21 and Cr 1.0- doing better 19: Neurogenic bowel: bowel program ordered -Dulcolax suppository ordered -12/13/22 per pt LBM yesterday, monitor 10/21- LBM this afternoon/AM- incontinent- educated pt NEEDs bowel program since not having Bms at the time to avoid therapies- going when doesn't want to- this will train gut to go when we wants her to go.  10/22- continue to refuse bowel program-in spite of education of bowel program and need for it.  10/23- pt did last night- had BM with bowel program and one this AM, but educated can take 3-6  weeks to train gut, but should be better- had bowel incontinence for 20 years 10/24- no bowel program documented  but had small BM's at midnight and 5am- said wasn't incontinent?will check on this 10/25- asked pt to get more dig stim this evening since likely cause of bowel incontinent in AM -12/20/22 reports BM this morning but no BM since 12/17/22 documented, no report to nursing staff given; monitor closely but if no BM by tomorrow may need to consider additional adjustments 10/29- LBM yesterday AM, and small with bowel program 10/27-no results last night- if no BM tonight, will give sorbitol/check KUB  10/30- no significant BM for 3 days- will give Sorbitol 30cc at 2pm for bowel program- also add Senna 1 tab daily for meds- 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  -LBM 11/3, continue current regimen Reviewed chart and had BM 11/5 -01/03/23 no further BMs since 11/8, just mucus; see GI plan above 11/11- still having mucus- will order KUB 11/12- Small output last night- KUB shows normal gas and stool pattern- not full of stool- likely due to not eating much.  11/13- no BM last night- will order Sorbitol 30cc 1400 to help her have BM- abd appears flat/not distended- soft 11/14- large BM after bowel program last night with Sorbitol 11/15- small BM last night- daughter called and was upset with nursing coordinator asking when can stop bowel program- was told that is lifetime.  -01/09/23 no BM since 01/07/23-- will see how today goes, but may need more sorbitol tomorrow if no BM still -01/10/23 LBM yesterday, cont regimen  20. Poor Appetite 10/22- will add Megace for poor appetite since cannot add Remeron and too sedated to add Periactin. Will also order push fluids since not drinking great- although her BUN?Cr looking better- BUN 25 and Cr 1.08-  10/23- pt feels she's drinking 6+ cups/day- ate muffin and some eggs this AM- better than last 2 days-  10/24- 25% of tray at most- will add  Remeron 15 mg at bedtime- ok'd by Cards.  10/25- changed remeron to 7.5 mg QHS 10/30- won't eat anything for breakfast- small appetite overall-  10/31- ate 1 pancake this AM- better than normal 11/11- on full liquid diet today- can switch back to regular diet tomorrow 11/12- ate grits this AM- put back on regular diet- Hb stable 11/13-  ate all eggs and toast 21. Prolonged QT interval 10/24- spoke with Cards- they suggested starting Remeron 15 mg at bedtime and then rechecking EKG ina few days to see if can add Tramadol. 10/29- doing ok with remeron- wait to add tramadol for now    22. Oversedation/lethargy/ UTI 10/25- septis and ABG work up- when speaking with PA- appeared to have ST elevation- asked them to call IM consult-  -12/19/22 sleepy but arousable today, U/A+, started bactrim as above, awaiting UCx; CT head neg last night, CBC/BMP stable, TSH/T4 WNL, CXR stable; suspect UTI as source. Monitor. See above #16 11/6: alert and bright this morning 11/7 -Patient alert and oriented x 4, following commands.  Vital signs stable.  Will recheck lab work and UA culture.  Overall she appears stable this morning although fatigue may be waxing and waning. 11/8 patient was restarted on Bactrim last night for suspected UTI, monitor renal function  11/12- BUN/Cr looking OK off bactrim 23. L radial pulse nonpalpable but dopplerable, BPs different in that arm-- unclear when this started, had some pain in L deltoid area yesterday but seemed to be better today. Suspect this pulse/BP discrepancy is 2/2 her vascular issue already, but reached out to vascular surgery to verify. Dr. Karin Lieu returned page, stated this is known since her surgery; nothing to do or be concerned about.   24. Pulsatile abdominal aorta: not noticed previously, not mentioned in notes, but today 12/20/22 I noticed it was very prominently pulsatile and pt reported it being sore in that area; spoke with Dr. Karin Lieu again of [facility], recommended  getting CTA Abd/pelv to evaluate for AAA. 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 Bradycardia -11/7 patient is being followed by cardiology.  Metoprolol has been stopped and ASA duration was recommended-ASA has been stopped.  -11/8 heart rate doing better today  11/12- HR doing much better- in 80s now 26. Stuffy nose  11/15- added Flonase and saline nasal spray as needed 27. Poor cough  11/15- educated pt on quad coughing- she felt it could help.     LOS: 29 days A FACE TO FACE EVALUATION WAS PERFORMED  [address] Pruss 01/10/2023, 9:52 AM

## 2023-01-10 NOTE — Progress Notes (Signed)
Occupational Therapy Session Note  Patient Details  Name: Heather Molina MRN: [id number] Date of Birth: 10-27-1940  Today's Date: 01/10/2023 OT Individual Time: 0900-1012 OT Individual Time Calculation (min): 72 min    Short Term Goals: Week 4:  OT Short Term Goal 1 (Week 4): STG=LTG d/t ELOS  Skilled Therapeutic Interventions/Progress Updates:      Therapy Documentation Precautions:  Precautions Precautions: Fall Precaution Comments: paraplegia Restrictions Weight Bearing Restrictions: No General: "I am going to miss you guys." Pt supine in bed upon OT arrival, agreeable to OT session. Pt requested shower this AM.  Pain: no pain reported  ADL: Bed mobility: CGA using bed rails for rolling Lt and Rt, Pt able to manipulate legs off of bed for supine>sit with Min A overall for trunk using bed rails Grooming: mod I seated at sink, using W/C arm rests to pull self forward to reach out of BOS Oral hygiene: mod I seated at sink using W/C arm rests to pull self forward to reach out of BOS, can manipulate containers Toileting: smear incontinent BM, total A to clean UB dressing: mod I seated in W/C for donning/doffing overhead shirt LB dressing:  Footwear: total A to doff socks/don PRAFOS once back in bed Shower transfer: total A, using roll in shower chair Bathing: Min A, assistance to clean peri area, using long handled sponge Transfers: bed><roll in shower chair, Mod A +2 for safety using transfer board and towel to prevent sheering  Other Treatments: OT completed skin check after bathing, no new lesions. OT donned new foam bandaid after bathing.    Pt supine in bed with bed alarm activated, 2 bed rails up, call light within reach and 4Ps assessed.   Therapy/Group: Individual Therapy  [name], OTD, OTR/L 01/10/2023, 12:43 PM

## 2023-01-10 NOTE — Progress Notes (Signed)
Occupational Therapy Discharge Summary  Patient Details  Name: Heather Molina MRN: [id number] Date of Birth: March 22, 1940  Date of Discharge from OT service:{MONTH:10108} {NUMBERS 1-31 (DATE):31396}, {YEAR HISTORY:31397}  {CHL IP REHAB OT TIME CALCULATIONS:[id number]}   Patient has met {NUMBERS 0-12:18577} of {NUMBERS 0-12:18577} long term goals due to improved activity tolerance, improved balance, postural control, and ability to compensate for deficits.  Patient to discharge at overall Mod Assist level.  Patient's care partner is independent to provide the necessary physical assistance at discharge.    Reasons goals not met: ***Pt daughter able to provide increased assistance for ADLs once D/C d/t discussions at family meeting/training.  Recommendation:  Patient will benefit from ongoing skilled OT services in home health setting to continue to advance functional skills in the area of BADL and Reduce care partner burden.  Equipment: No equipment provided. Pt daughter private purchasing roll in shower chair, standard W/C with cushion, hospital bed, air pad for recliner for pressure relief, ramp for house entrance.   Reasons for discharge: treatment goals met and discharge from hospital  Patient/family agrees with progress made and goals achieved: Yes  OT Discharge Precautions/Restrictions    General   Vital Signs Therapy Vitals Pulse Rate: 84 BP: (!) 128/55 Oxygen Therapy O2 Device: Nasal Cannula O2 Flow Rate (L/min): 2 L/min Pain Pain Assessment Pain Scale: 0-10 Pain Score: 0-No pain ADL ADL Eating: Set up Where Assessed-Eating: Bed level Grooming: Supervision/safety Where Assessed-Grooming: Sitting at sink, Wheelchair Upper Body Bathing: Minimal assistance Where Assessed-Upper Body Bathing: Bed level Lower Body Bathing: Maximal assistance Where Assessed-Lower Body Bathing: Bed level Upper Body Dressing: Moderate assistance Where Assessed-Upper Body Dressing: Bed  level Lower Body Dressing: Dependent Where Assessed-Lower Body Dressing: Bed level Toileting: Not assessed (foley) Where Assessed-Toileting: Bed level Toilet Transfer: Not assessed (safety concerns) Toilet Transfer Method: Unable to assess Tub/Shower Transfer: Unable to assess Tub/Shower Transfer Method: Unable to assess Film/video editor: Unable to assess Film/video editor Method: Unable to assess ADL Comments: ADLs completed bed level d/t decreased trunk support and increased independence with bed level activities Vision   Perception    Praxis   Cognition   Sensation   Motor    Mobility     Trunk/Postural Assessment     Balance   Extremity/Trunk Assessment       [name], OTD, OTR/L 01/10/2023, 10:05 AM

## 2023-01-10 NOTE — Progress Notes (Signed)
Physical Therapy Session Note  Patient Details  Name: KARESHA WECKMAN MRN: [id number] Date of Birth: 1940-04-21  Today's Date: 01/10/2023 PT Individual Time: 1300-1357 PT Individual Time Calculation (min): 57 min   Short Term Goals: Week 3:  PT Short Term Goal 1 (Week 3): STG's = LTG's due to ELOS  Skilled Therapeutic Interventions/Progress Updates:      Therapy Documentation Precautions:  Precautions Precautions: Fall Precaution Comments: paraplegia Restrictions Weight Bearing Restrictions: No  Pt received with daughter, Inetta Fermo, present who called pt's other daughter [name] to allow PT to discuss discharge planning. Pt's family reports hospital bed delivered on Friday but with full rails and inquired if could be replaced with 4 rails to allow pt to enter/exit bed with decreased assistance. Pt's daughter also curious if insurance would cover speciality w/c cushion (16 x 16 hybrid ROHO) and if a drop arm bedside commode will be delivered. Pt family states they did not require DABSC as pt has rolling shower chair with padded seated and opening for toileting. PT will follow up with social worker tomorrow regarding discussion with patient and family.   Pt agreeable to bed level UE strength exercises and without reports of pain. Pt performed following UE exercises with 2# weight bear:   -chest press 3 x 10   -chest press + OH press 2 x 12   -isometric chest + OH press (1 min per exercise- 2 min total) x 2   Pt left semi-reclined in bed with all needs in reach and alarm on.     Therapy/Group: Individual Therapy  Truitt Leep Truitt Leep PT, DPT  01/10/2023, 1:39 PM

## 2023-01-10 NOTE — Progress Notes (Addendum)
Patient is AAO x4. Currently sitting in bed with daughter at bedside. No bowel  movement so far today but had a BM yesterday. Denies nausea and vomiting. Heart sounds auscultated and S1 and S2 heard. Lungs clear with no wheezing or crackles. Patient is currently on 2L of oxygen via [place]. Patient has no complaints at the moment. Trachea midline with no deviation. Patient is able to swallow with no regurgitation or aspiration. Patient only ate some of her omelet for breakfast with a few sips of coffee. Patient states she was really hungry but after a few bites, she got really full. Does not like to drink ensure but loves the mighty shakes and will drink those. Patient has sacral dressing, left forearm dressing, and BL heel dressing. Skin tear on left hand is closed with scab formation. Bruising and dry skin noticed on all extremities and scab formation on left anterior lower leg. Skin turgor performed on clavicle with slow return but no tenting noted. Unable to assess pulses on posterior tibialis due to dressing on BL heel. BL pulse felt on dorsalis pedis. Pulse felt on right brachial and right radial but unable to feel pulse on left brachial and left radial. 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 Patient ate 50% of lunch meal and finished the mighty shake. In and out catheter performed with 300 mL output. Patient is having no pain and does not need pain medication before Physical therapy. States she may need some tylenol after therapy session. Will assess pain after therapy.  I agree with this student's documentation.

## 2023-01-11 ENCOUNTER — Inpatient Hospital Stay (HOSPITAL_COMMUNITY): Payer: Medicare Other

## 2023-01-11 ENCOUNTER — Other Ambulatory Visit (HOSPITAL_COMMUNITY): Payer: Self-pay

## 2023-01-11 LAB — CBC WITH DIFFERENTIAL/PLATELET
Abs Immature Granulocytes: 0.06 10*3/uL (ref 0.00–0.07)
Basophils Absolute: 0 10*3/uL (ref 0.0–0.1)
Basophils Relative: 1 %
Eosinophils Absolute: 0.1 10*3/uL (ref 0.0–0.5)
Eosinophils Relative: 1 %
HCT: 26.3 % — ABNORMAL LOW (ref 36.0–46.0)
Hemoglobin: 8.6 g/dL — ABNORMAL LOW (ref 12.0–15.0)
Immature Granulocytes: 1 %
Lymphocytes Relative: 10 %
Lymphs Abs: 0.9 10*3/uL (ref 0.7–4.0)
MCH: 28.6 pg (ref 26.0–34.0)
MCHC: 32.7 g/dL (ref 30.0–36.0)
MCV: 87.4 fL (ref 80.0–100.0)
Monocytes Absolute: 0.7 10*3/uL (ref 0.1–1.0)
Monocytes Relative: 8 %
Neutro Abs: 7 10*3/uL (ref 1.7–7.7)
Neutrophils Relative %: 79 %
Platelets: 293 10*3/uL (ref 150–400)
RBC: 3.01 MIL/uL — ABNORMAL LOW (ref 3.87–5.11)
RDW: 18.1 % — ABNORMAL HIGH (ref 11.5–15.5)
WBC: 8.8 10*3/uL (ref 4.0–10.5)
nRBC: 0 % (ref 0.0–0.2)

## 2023-01-11 LAB — BASIC METABOLIC PANEL
Anion gap: 5 (ref 5–15)
BUN: 30 mg/dL — ABNORMAL HIGH (ref 8–23)
CO2: 26 mmol/L (ref 22–32)
Calcium: 8.4 mg/dL — ABNORMAL LOW (ref 8.9–10.3)
Chloride: 105 mmol/L (ref 98–111)
Creatinine, Ser: 1 mg/dL (ref 0.44–1.00)
GFR, Estimated: 56 mL/min — ABNORMAL LOW (ref >60)
Glucose, Bld: 96 mg/dL (ref 70–99)
Potassium: 3.9 mmol/L (ref 3.5–5.1)
Sodium: 136 mmol/L (ref 135–145)

## 2023-01-11 MED ORDER — IOHEXOL 350 MG/ML SOLN
75.0000 mL | Freq: Once | INTRAVENOUS | Status: AC | PRN
  Administered 2023-01-11: 75 mL via INTRAVENOUS

## 2023-01-11 MED ORDER — SENNA 8.6 MG PO TABS
1.0000 | ORAL_TABLET | Freq: Every day | ORAL | 0 refills | Status: DC
  Filled 2023-01-11: qty 120, 120d supply, fill #0

## 2023-01-11 MED ORDER — ADULT MULTIVITAMIN W/MINERALS CH: 1.0000 | ORAL_TABLET | Freq: Every day | ORAL | Status: DC

## 2023-01-11 MED ORDER — FLUTICASONE PROPIONATE 50 MCG/ACT NA SUSP: 2.0000 | Freq: Every day | NASAL | Status: DC

## 2023-01-11 MED ORDER — AMLODIPINE BESYLATE 5 MG PO TABS
5.0000 mg | ORAL_TABLET | Freq: Every day | ORAL | 0 refills | Status: DC
  Filled 2023-01-11: qty 30, 30d supply, fill #0

## 2023-01-11 MED ORDER — ASCORBIC ACID 500 MG PO TABS
250.0000 mg | ORAL_TABLET | Freq: Every day | ORAL | 0 refills | Status: DC
  Filled 2023-01-11: qty 30, 60d supply, fill #0

## 2023-01-11 MED ORDER — TAMSULOSIN HCL 0.4 MG PO CAPS
0.4000 mg | ORAL_CAPSULE | Freq: Every day | ORAL | 0 refills | Status: DC
  Filled 2023-01-11: qty 30, 30d supply, fill #0

## 2023-01-11 MED ORDER — ACETAMINOPHEN 325 MG PO TABS: 325.0000 mg | ORAL_TABLET | ORAL | Status: DC | PRN

## 2023-01-11 MED ORDER — SUCRALFATE 1 GM/10ML PO SUSP
1.0000 g | Freq: Three times a day (TID) | ORAL | 0 refills | Status: AC
  Filled 2023-01-11: qty 280, 7d supply, fill #0

## 2023-01-11 MED ORDER — PANTOPRAZOLE SODIUM 40 MG PO TBEC
40.0000 mg | DELAYED_RELEASE_TABLET | Freq: Two times a day (BID) | ORAL | 1 refills | Status: DC
  Filled 2023-01-11: qty 60, 30d supply, fill #0

## 2023-01-11 MED ORDER — SODIUM CHLORIDE 1 G PO TABS
1.0000 g | ORAL_TABLET | Freq: Every day | ORAL | 0 refills | Status: DC
  Filled 2023-01-11: qty 30, 30d supply, fill #0

## 2023-01-11 MED ORDER — BISACODYL 10 MG RE SUPP
10.0000 mg | Freq: Every day | RECTAL | 1 refills | Status: DC
  Filled 2023-01-11: qty 12, 12d supply, fill #0

## 2023-01-11 NOTE — Plan of Care (Signed)
  Problem: RH Dressing Goal: LTG Patient will perform lower body dressing w/assist (OT) Description: LTG: Patient will perform lower body dressing with assist, with/without cues in positioning using equipment (OT) Outcome: Adequate for Discharge Flowsheets (Taken 01/11/2023 1230) LTG: Pt will perform lower body dressing with assistance level of: (daughter will assist with task) Moderate Assistance - Patient 50 - 74%   Problem: RH Toileting Goal: LTG Patient will perform toileting task (3/3 steps) with assistance level (OT) Description: LTG: Patient will perform toileting task (3/3 steps) with assistance level (OT)  Outcome: Adequate for Discharge Flowsheets (Taken 01/11/2023 1230) LTG: Pt will perform toileting task (3/3 steps) with assistance level: (daughter trained on B&B) --   Problem: RH Bathing Goal: LTG Patient will bathe all body parts with assist levels (OT) Description: LTG: Patient will bathe all body parts with assist levels (OT) Outcome: Not Met (add Reason) Flowsheets (Taken 01/11/2023 1230) LTG: Pt will perform bathing with assistance level/cueing: (assistance rewuired for buttocks) Minimal Assistance - Patient > 75% LTG: Position pt will perform bathing: Shower   Problem: RH Balance Goal: LTG: Patient will maintain dynamic sitting balance (OT) Description: LTG:  Patient will maintain dynamic sitting balance with assistance during activities of daily living (OT) Outcome: Completed/Met Flowsheets (Taken 12/13/2022 1228) LTG: Pt will maintain dynamic sitting balance during ADLs with: Contact Guard/Touching assist   Problem: RH Eating Goal: LTG Patient will perform eating w/assist, cues/equip (OT) Description: LTG: Patient will perform eating with assist, with/without cues using equipment (OT) Outcome: Completed/Met Flowsheets (Taken 12/13/2022 1228) LTG: Pt will perform eating with assistance level of: Set up assist    Problem: RH Grooming Goal: LTG Patient will  perform grooming w/assist,cues/equip (OT) Description: LTG: Patient will perform grooming with assist, with/without cues using equipment (OT) Outcome: Completed/Met Flowsheets (Taken 12/13/2022 1228) LTG: Pt will perform grooming with assistance level of: Set up assist    Problem: RH Dressing Goal: LTG Patient will perform upper body dressing (OT) Description: LTG Patient will perform upper body dressing with assist, with/without cues (OT). Outcome: Completed/Met Flowsheets (Taken 12/13/2022 1228) LTG: Pt will perform upper body dressing with assistance level of: Supervision/Verbal cueing   Problem: RH Toilet Transfers Goal: LTG Patient will perform toilet transfers w/assist (OT) Description: LTG: Patient will perform toilet transfers with assist, with/without cues using equipment (OT) Outcome: Completed/Met Flowsheets (Taken 12/13/2022 1228) LTG: Pt will perform toilet transfers with assistance level of: Minimal Assistance - Patient > 75%

## 2023-01-11 NOTE — Progress Notes (Signed)
Physical Therapy Discharge Summary  Patient Details  Name: Heather Molina MRN: [id number] Date of Birth: 09/11/1940  Date of Discharge from PT service:January 11, 2023  Today's Date: 01/11/2023 PT Individual Time: 1425-1440 PT Individual Time Calculation (min): 15 min  and Today's Date: 01/11/2023 PT Missed Time: 45 Minutes Missed Time Reason: Nursing care;Patient fatigue   Patient has met 4 of 4 long term goals due to improved balance, improved postural control, increased range of motion, ability to compensate for deficits, and improved coordination.  Patient to discharge at a wheelchair level Min Assist.   Pt and daughter, [name], participated in family training and demonstrates competency and understanding with bed mobility and slide board transfers. Unable to practice real life car transfer, PT educated pt daughter on how to perform.   Reasons goals not met: N/A   Recommendation:  Patient will benefit from ongoing skilled PT services in home health setting to continue to advance safe functional mobility, address ongoing impairments in postural control, strength, balance, coordination, and minimize fall risk.  Equipment: PT currently recommends power wheelchair with hybrid Roho cushion. Patient and family educated on benefits and purpose of recommendation and pt and family declined recommendation PT recommends hospital bed and slide board.   Reasons for discharge: treatment goals met and discharge from hospital  Patient/family agrees with progress made and goals achieved: Yes  PT Discharge Precautions/Restrictions Precautions Precautions: Fall;Other (comment) Precaution Comments: paraplegia Restrictions Weight Bearing Restrictions: No Pain Interference Pain Interference Pain Effect on Sleep: 1. Rarely or not at all Pain Interference with Therapy Activities: 2. Occasionally Pain Interference with Day-to-Day Activities: 1. Rarely or not at all Cognition Overall Cognitive  Status: Within Functional Limits for tasks assessed Arousal/Alertness: Awake/alert Orientation Level: Oriented X4 Memory: Appears intact Awareness: Appears intact Problem Solving: Appears intact Safety/Judgment: Appears intact Sensation Sensation Light Touch: Appears Intact Proprioception: Impaired by gross assessment (bilateral lower extremities) Coordination Gross Motor Movements are Fluid and Coordinated: No Fine Motor Movements are Fluid and Coordinated: Yes Coordination and Movement Description: WFL UE, paraplegia LE Motor  Motor Motor: Paraplegia Motor - Skilled Clinical Observations: paraplegia  Mobility Bed Mobility Bed Mobility: Rolling Right;Rolling Left;Sit to Supine;Supine to Sit Rolling Right: Supervision/verbal cueing Rolling Left: Supervision/Verbal cueing Supine to Sit: Minimal Assistance - Patient > 75% Sit to Supine: Minimal Assistance - Patient > 75% Transfers Transfers: Lateral/Scoot Transfers Lateral/Scoot Transfers: Minimal Assistance - Patient > 75% Transfer (Assistive device): Other (Comment) (slide board) Locomotion  Gait Ambulation: No Gait Gait: No Stairs / Additional Locomotion Stairs: No Pick up small object from the floor (from standing position) activity did not occur: Safety/medical concerns (paraplegia) Wheelchair Mobility Wheelchair Mobility: Yes Wheelchair Assistance: Veterinary surgeon: Right upper extremity Wheelchair Parts Management: Needs assistance Distance: 300 ft with power wheel chair  Trunk/Postural Assessment  Cervical Assessment Cervical Assessment: Exceptions to [facility] (forward head) Thoracic Assessment Thoracic Assessment: Exceptions to [facility] (rounded shoulders) Lumbar Assessment Lumbar Assessment: Exceptions to [facility] (posterior pelvic tilt) Postural Control Postural Control: Deficits on evaluation Righting Reactions: delayed  Balance Balance Balance Assessed: Yes Static Sitting  Balance Static Sitting - Balance Support: Bilateral upper extremity supported;Feet supported Static Sitting - Level of Assistance: 5: Stand by assistance (SBA) Dynamic Sitting Balance Dynamic Sitting - Balance Support: Right upper extremity supported;During functional activity;Feet supported Dynamic Sitting - Level of Assistance: 4: Min assist Dynamic Sitting - Balance Activities: Forward lean/weight shifting;Ball toss;Reaching for objects Extremity Assessment  RLE Assessment RLE Assessment: Exceptions to [facility] General Strength Comments: 0/5  LLE Assessment LLE Assessment: Exceptions to [facility] General Strength Comments: 0/5     Truitt Leep Truitt Leep PT, DPT  01/11/2023, 3:40 PM

## 2023-01-11 NOTE — Progress Notes (Signed)
PROGRESS NOTE   Subjective/Complaints:  Pt reports had BM last night- was moderate per chart-  Slept good! No other complaints.   [place] Spaniel daughter is trained on bowel and bladder.  D/c tomorrow  ROS:    Pt denies SOB, abd pain, CP, N/V/C/D, and vision changes   Except for HPI  Objective:   No results found. Recent Labs    01/11/23 0643  WBC 8.8  HGB 8.6*  HCT 26.3*  PLT 293      Recent Labs    01/11/23 0643  NA 136  K 3.9  CL 105  CO2 26  GLUCOSE 96  BUN 30*  CREATININE 1.00  CALCIUM 8.4*         Intake/Output Summary (Last 24 hours) at 01/11/2023 0911 Last data filed at 01/11/2023 0102 Gross per 24 hour  Intake 580 ml  Output 900 ml  Net -320 ml     Pressure Injury 12/12/22 Sacrum Medial Deep Tissue Pressure Injury - Purple or maroon localized area of discolored intact skin or blood-filled blister due to damage of underlying soft tissue from pressure and/or shear. (Active)  12/12/22 1844  Location: Sacrum  Location Orientation: Medial  Staging: Deep Tissue Pressure Injury - Purple or maroon localized area of discolored intact skin or blood-filled blister due to damage of underlying soft tissue from pressure and/or shear.  Wound Description (Comments):   Present on Admission: Yes     Pressure Injury 12/14/22 Heel Left;Lateral;Posterior Stage 2 -  Partial thickness loss of dermis presenting as a shallow open injury with a red, pink wound bed without slough. (Active)  12/14/22 (first assessed by [facility]) 1221  Location: Heel  Location Orientation: Left;Lateral;Posterior  Staging: Stage 2 -  Partial thickness loss of dermis presenting as a shallow open injury with a red, pink wound bed without slough.  Wound Description (Comments):   Present on Admission: No     Pressure Injury 12/14/22 Heel Right;Lateral Stage 2 -  Partial thickness loss of dermis presenting as a shallow open injury with a red,  pink wound bed without slough. (Active)  12/14/22 (first assessed by [facility]) 1221  Location: Heel  Location Orientation: Right;Lateral  Staging: Stage 2 -  Partial thickness loss of dermis presenting as a shallow open injury with a red, pink wound bed without slough.  Wound Description (Comments):   Present on Admission: No     Pressure Injury 12/14/22 Ischial tuberosity Left;Medial Stage 2 -  Partial thickness loss of dermis presenting as a shallow open injury with a red, pink wound bed without slough. blister (Active)  12/14/22 (first assessed by [facility] nurse) 1221  Location: Ischial tuberosity  Location Orientation: Left;Medial  Staging: Stage 2 -  Partial thickness loss of dermis presenting as a shallow open injury with a red, pink wound bed without slough.  Wound Description (Comments): blister  Present on Admission: No     Physical Exam: Vital Signs Blood pressure (!) 150/67, pulse 84, temperature 97.9 F (36.6 C), temperature source Oral, resp. rate 17, height 5' (1.524 m), weight 50.5 kg, SpO2 92%.     General: awake, alert, appropriate, sitting up in bed; NAD HENT: conjugate gaze; oropharynx  dry- and O2 by [place] 2L  CV: regular rate and irregular rhythm ; no JVD Pulmonary: CTA B/L; no W/R/R- good air movement GI: soft, NT, ND, (+)BS- normoactive Psychiatric: appropriate Neurological: Ox3    Prior exams MS; was able to move RLE< but from abd muscles- not in LE muscles- no movement seen in LLE either-  Skin: Groin incision site looks okay, bruising noted bilateral shins 2 IVs in place left upper extremity looks okay Extremities: unable to palpate L radial pulse but dopplers confirm pulse with rate in 60-70s, arm well perfused appearing, warm and pink, motor function preserved. Brachial pulse also unable to be felt. R radial pulse strong and palpable.    Neuro:   Mental Status: AAOx3, memory intact,  Speech/Languate:  fluent, follows simple commands CRANIAL NERVES: II:  PERRL. Visual fields full III, IV, VI: EOM intact, no gaze preference or deviation V: normal sensation bilaterally VII: no asymmetry VIII: normal hearing to speech IX, X: normal palatal elevation XI: 5/5 head turn and 5/5 shoulder shrug bilaterally XII: Tongue midline     MOTOR: RUE: 5/5 Deltoid, 5/5 Biceps, 5/5 Triceps,5/5 Grip LUE: 5/5 Deltoid, 5/5 Biceps, 5/5 Triceps, 5/5 Grip RLE: 0 out of 5 throughout LLE: 0 out of 5 throughout, exam stable 11/6   SENSORY: Normal to touch all 4 extremities to light touch in bilateral upper and lower extremities, patchy altered sensation to cold in lower extremities   No ankle clonus bilaterally   Coordination: Normal finger to nose, no dysmetria  Assessment/Plan: 1. Functional deficits which require 3+ hours per day of interdisciplinary therapy in a comprehensive inpatient rehab setting. Physiatrist is providing close team supervision and 24 hour management of active medical problems listed below. Physiatrist and rehab team continue to assess barriers to discharge/monitor patient progress toward functional and medical goals  Care Tool:  Bathing    Body parts bathed by patient: Right arm, Left arm, Chest, Abdomen, Front perineal area, Face, Right upper leg, Left upper leg   Body parts bathed by helper: Buttocks, Right lower leg, Left lower leg     Bathing assist Assist Level: Minimal Assistance - Patient > 75%     Upper Body Dressing/Undressing Upper body dressing   What is the patient wearing?: Pull over shirt    Upper body assist Assist Level: Set up assist    Lower Body Dressing/Undressing Lower body dressing      What is the patient wearing?: Pants, Incontinence brief     Lower body assist Assist for lower body dressing: Maximal Assistance - Patient 25 - 49%     Toileting Toileting    Toileting assist Assist for toileting: Total Assistance - Patient < 25%     Transfers Chair/bed transfer  Transfers assist   Chair/bed transfer activity did not occur: Safety/medical concerns  Chair/bed transfer assist level: Minimal Assistance - Patient > 75% (SB tranfser w/c>EOB)     Locomotion Ambulation   Ambulation assist   Ambulation activity did not occur: Safety/medical concerns (paraplegia, fatigue)          Walk 10 feet activity   Assist  Walk 10 feet activity did not occur: Safety/medical concerns (paraplegia, fatigue)        Walk 50 feet activity   Assist Walk 50 feet with 2 turns activity did not occur: Safety/medical concerns (paraplegia, fatigue)         Walk 150 feet activity   Assist Walk 150 feet activity did not occur: Safety/medical concerns (paraplegia, fatigue)  Walk 10 feet on uneven surface  activity   Assist Walk 10 feet on uneven surfaces activity did not occur: Safety/medical concerns (paraplegia, fatigue)         Wheelchair     Assist Is the patient using a wheelchair?: Yes Type of Wheelchair: Manual Wheelchair activity did not occur: Safety/medical concerns (paraplegia, fatigue)  Wheelchair assist level: Contact Guard/Touching assist Max wheelchair distance: 50    Wheelchair 50 feet with 2 turns activity    Assist    Wheelchair 50 feet with 2 turns activity did not occur: Safety/medical concerns (paraplegia, fatigue)   Assist Level: Contact Guard/Touching assist   Wheelchair 150 feet activity     Assist  Wheelchair 150 feet activity did not occur: Safety/medical concerns (paraplegia, fatigue)   Assist Level: Maximal Assistance - Patient 25 - 49%   Blood pressure (!) 150/67, pulse 84, temperature 97.9 F (36.6 C), temperature source Oral, resp. rate 17, height 5' (1.524 m), weight 50.5 kg, SpO2 92%.  Medical Problem List and Plan: 1. 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 Per [facility], needs to be done Monday and not earlier-  11/18- sent a message to nursing to check on CTA to be done before d/c.  2.  Antithrombotics: -DVT/anticoagulation:  Pharmaceutical: Eliquis 2.5mg  BID-- on hold for EGD 01/04/23 11/11- can be restarted in 2 days at 2.5 mg BID per GI-  11/13- restarted Elqiuis -antiplatelet therapy: Aspirin 81 mg daily- discontinued per cardiology recommendation    3. Pain Management: continue Tylenol, robaxin, oxycodone as needed 10/24- will wait to try Tramadol for pain/discomfort til have tried  Remeron 7.5mg  for sleep 10/31- stopped Remeron 4. Mood/Behavior/Sleep: LCSW to evaluate and provide emotional support             -continue paroxetine 20 mg daily (hx of depression)             -antipsychotic agents: n/a -12/13/22 hasn't been sleeping well, increase melatonin to 10mg  QHS, added Trazodone 25mg  PRN but advised to be cautious about using it too late.  10/21- will stop Trazodone- can impair ability to pee- will start Restoril 7.5 mg at bedtime for sleep.  10/22- cannot start Remeron due to risk of torsades de pointes- will con't Restoril  10/23- didn't sleep well again- will increase Restoril to 15 mg at bedtime 10/24- spoke to Cards- they are ok trying Remeron but wait on Tramadol-will start 15 mg at bedtime -will stop Restoril>> down to 7.5mg  -12/19/22 slept "wonderfully"! Cont regimen 10/28- Slept poorly- still on rmeron- sounds like every other night she slept well 10/30- sleeping better 10/31- stopped remeron- slept ok yesterday as well  01/02/23 not sleeping well per patient, but d/t bed; monitor for now 11/11- sleeping "as well as can".  5. Neuropsych/cognition: This patient is capable of making decisions on her own behalf.   6. Skin/Wound Care: Routine skin care checks   7.  Fluids/Electrolytes/Nutrition: Routine Is and Os and follow-up chemistries  -01/02/23 BUN down to 27 this morning, Cr 0.92, monitor routinely   11/11- BUN down to 21 from 27 and Cr 1.00- doing better 8:  Hypertension: monitor TID and prn (hx of a.fib see meds below). Monitor with mobility. Continue metoprolol 25mg  BID  Reduced Metoprolol per pt request 10/24- will add Norvasc 2.5 mg daily since BP elevated and not coming down  10/25- calling IM- maybe they can help- just added Norvasc yesterday -12/19/22 BPs improving a little this morning, monitor -12/20/22 BPs still a tad elevated but stable, monitor a couple more days before deciding on changes for now 10/28- BP on low side, but Hb low- will transfuse 2 units 10/29- BP looking more labile- but better than it was- monitor for now 11/1: continue current regimen -12/26/22 BPs variable, monitor trend for now 11/5- BP running 130s- 170's- Will increase Norvasc to 5 mg daily  11/7 BP stable, metoprolol discontinued by cardiology 11/8 BP above goal today.  If continues to be elevated cardiology recommends addition of ARB and would not recommend further titration of amlodipine. Will hold off on change for now as would like to avoid hypotension -11/9-10/24 BPs a bit better, cont regimen  11/11- BP doing OK- intermittently in 140's- con't regimen  11/12-11/13 BP in 140s- but holding in low to mid 140s-  11/14- BP running 120s to 140s- off B blocker- family does not want her on one. 11/15- BP running 120s-140s systolic- con't regimen  -11/16-17/24 BPs mostly stable, cont regimen 11/18- BP's running 130-150s- con't regimen Vitals:   01/07/23 2020 01/08/23 0605 01/08/23 1401 01/08/23 1909  BP: (!) 128/51 (!) 145/52 (!) 150/58 (!) 153/56   01/09/23 0624 01/09/23 0808 01/09/23 1337 01/09/23 1950  BP: (!) 140/63 (!) 134/54 (!) 131/44 (!) 149/54   01/10/23 0826 01/10/23 1216 01/10/23 1926 01/11/23 0602  BP: (!) 128/55 (!) 142/59 (!) 150/55 (!) 150/67     9: Hyperlipidemia: continue pravastatin 40mg  daily   10: COPD, O2 dependent 2L via [place]:             -continue Brovana nebs BID             -continue Yupelri neb daily   10/21- Has been on 2L O2 at home per pt and chart  10/25- ABG shows CO2 too low- needs to reduce O2 levels  10/31- Sats ok- con't O2  11/15- stable 11: CAD: continue asa and statin   12: Atrial fibrillation, paroxysmal: maintained on Eliquis reversed pre-operatively             -continue amiodarone 100 mg daily             -continue metoprolol tartrate 25 mg BID             -cleared by [facility] to re-start Eliquis>pharmacy consult placed   10/25- reduced Metoprolol to 12.5 mg BID earlier in week - doing better  10/30- BP doing better 11/8 cardiology does not feel she is currently in A-fib, they recommend continue amiodarone 100 mg daily and avoid nodal agents, continue Eliquis  11/11- held Eliquis due to GI bleed- can restart 11/13  11/13- will restart Eliquis 2.5 mg BID 13: 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 10/22- not voiding -U/A (-)  will add Flomax 0.4 mg q supper to see if she can void- I don't think it's likely. D/w nursing due to low amount of caths-  -12/19/22 U/A overnight looks infectious, will start Bactrim DS BID x5d, UCx pending; monitor -12/20/22 UCx with >100k CFU of K. Pneumoniae, sensitive to bactrim, cont abx  course 10/29- Being cathed- volumes low- pushing fluids, but will also give 1 L IVFs 10/30- not drinking- Got IVFs but BUN up to 45 and Cr 1.41- will call renal- and recheck in AM-- a little improved on 10/31 labs 11/5- drinking much better- volumes looking better- and Cr down to 1.16 and BUN 29 from 40's 11/11- Volumes on caths doing better 11/12- Cath volumes looking good 11/14- caths going ok- explained/educated pt/family about if starts having a lot of leakage between caths, will need to start Gemtesa vs Oxybutynin - 11/15-17 Bladder cath going well  17: ABLA: stable; follow CBC trend             -consider oral iron supplement -12/13/22 Hgb slowly downtrending from postop 8.8 on 10/14>7.6>7.5>7.3>7.0 yesterday; pt asymptomatic at this time; would recheck tomorrow but if still at the 7.0-range then would consider transfusion to help with therapy tolerance; unclear what her prior baseline was (has hgb 12.2 back in July but then down to 9's).    10/21- Hb 7.4 today  10/23- Hb 7.2 yesterday- will check in AM 10/24- Hb down to 7.0- will d/w pt if she wants to be transfused. If so, will give 1 unit pRBCs.  Doesn't have location of losing blood- will check FOBT x3  10/25- Hb 8.1- likely too dry  10/28- Hb 6.7- will transfuse 2 units 10/29- Hb much better AND pt feels MUCH better per pt- Hb up to 9.9- likely so high since pt really dry- will also rewrite for FOBT- hasn't been done  10/30- no bowel results- so cannot check FOBT yet.  10/31- had 2 extra large Bms last night- didn't check FOBT_ will order again. Will d/w nursing 11/4: Hgb reviewed and is stable 11/5- Hb 9.2 11/8 HGB down to 8.7 yesterday, FOBT postive, has had multiple dark stools recovered, will contact GI, continue PPI -GI recommends to hold Eliquis per note.  Discussed with Dr. Vivianne Master discontinue for now.  Eliquis stopped.  Appreciate GI/cardiology assistance -01/02/23 Hgb stable at 8.2 today, monitor -01/03/23 Hgb down to  8.0; transfuse to keep hgb >7 per GI notes; plan for EGD tomorrow; per notes, PPI IV but PO currently, asked GI and they said PO is fine. Will leave order in place.  11/11- Hb 8.4- will recheck in AM after EGD and see if going up at all- argon used for lesion- might also have esophageal candida- waiting on biopsy results-  -on PPI BID and Carafate QID for 2 weeks per GI- they signed off 11/12- looking at Surgical pathology- doesn' thave fungal elements- so unlikely to be Fungus/thrush- Hb 8.5 11.13- no Thrush/candidada esophagitis per GI  -01/10/23 FOBT+ still, but doubt we need to continue these, d/c'd order  11/18- Hb 8.6- doing better/stable 18: AKI: improving; CMP 12/12/22 showing Cr 1.31; monitor Monday BMPs 10/21- Cr 1.09 down from 1.31 and BUN 25 down from 25- however is  drinking poorly and has poor output- 10/23- Says drinking better- will check BMP in AM  10/25- Cr 0.9 and BUN slightly high at 22- but based on Hb increase, likely dry 10/28- Cr 1.4 and BUN elevated- will give blood and recheck BMP in AM 10/29- BUN up to 40 and Cr 1.41- will need to give IVFs- tried seeing if blood would help, but still needs fluids- will her BUN/Cr- will replete 75 cc/hour after therapy today and recheck BMP in AM- of note, her Cr was running ~ 1.0 and her BUN ~ 25- so big change- will push fluids as well- will give 75cc/hour for 1 liter  10/31- likely form Septra- will stop ABX-  11/4: Cr reviewed and is improving 11/5- Cr 1.16 and BUN 29 down from 40's 11/8 patient was given IV fluids yesterday for elevated BUN, recheck labs today -01/02/23 BUN down to 27, Cr 0.92, monitor 11/11- BUN 21 and Cr 1.0- doing better 11/18- BUN 30- but Cr 1.0- need to push fluids 19: Neurogenic bowel: bowel program ordered -Dulcolax suppository ordered -12/13/22 per pt LBM yesterday, monitor 10/21- LBM this afternoon/AM- incontinent- educated pt NEEDs bowel program since not having Bms at the time to avoid therapies- going  when doesn't want to- this will train gut to go when we wants her to go.  10/22- continue to refuse bowel program-in spite of education of bowel program and need for it.  10/23- pt did last night- had BM with bowel program and one this AM, but educated can take 3-6 weeks to train gut, but should be better- had bowel incontinence for 20 years 10/24- no bowel program documented  but had small BM's at midnight and 5am- said wasn't incontinent?will check on this 10/25- asked pt to get more dig stim this evening since likely cause of bowel incontinent in AM -12/20/22 reports BM this morning but no BM since 12/17/22 documented, no report to nursing staff given; monitor closely but if no BM by tomorrow may need to consider additional adjustments 10/29- LBM yesterday AM, and small with bowel program 10/27-no results last night- if no BM tonight, will give sorbitol/check KUB  10/30- no significant BM for 3 days- will give Sorbitol 30cc at 2pm for bowel program- also add Senna 1 tab daily for meds- 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  -LBM 11/3, continue current regimen Reviewed chart and had BM 11/5 -01/03/23 no further BMs since 11/8, just mucus; see GI plan above 11/11- still having mucus- will order KUB 11/12- Small output last night- KUB shows normal gas and stool pattern- not full of stool- likely due to not eating much.  11/13- no BM last night- will order Sorbitol 30cc 1400 to help her have BM- abd appears flat/not distended- soft 11/14- large BM after bowel program last night with Sorbitol 11/15- small BM last night- daughter called and was upset with nursing coordinator asking when can stop bowel program- was told that is lifetime.  -01/09/23 no BM since 01/07/23-- will see how today goes, but may need more sorbitol tomorrow if no BM still -01/10/23 LBM yesterday, cont regimen  20. Poor Appetite 10/22- will add Megace for poor appetite since cannot add Remeron and too sedated to  add Periactin. Will also order push fluids since not drinking great- although her BUN?Cr looking better- BUN 25 and Cr 1.08-  10/23- pt feels she's drinking 6+ cups/day- ate muffin and some eggs this AM- better than last 2 days-  10/24- 25%  of tray at most- will add Remeron 15 mg at bedtime- ok'd by Cards.  10/25- changed remeron to 7.5 mg QHS 10/30- won't eat anything for breakfast- small appetite overall-  10/31- ate 1 pancake this AM- better than normal 11/11- on full liquid diet today- can switch back to regular diet tomorrow 11/12- ate grits this AM- put back on regular diet- Hb stable 11/13- ate all eggs and toast 21. Prolonged QT interval 10/24- spoke with Cards- they suggested starting Remeron 15 mg at bedtime and then rechecking EKG ina few days to see if can add Tramadol. 10/29- doing ok with remeron- wait to add tramadol for now    22. Oversedation/lethargy/ UTI 10/25- septis and ABG work up- when speaking with PA- appeared to have ST elevation- asked them to call IM consult-  -12/19/22 sleepy but arousable today, U/A+, started bactrim as above, awaiting UCx; CT head neg last night, CBC/BMP stable, TSH/T4 WNL, CXR stable; suspect UTI as source. Monitor. See above #16 11/6: alert and bright this morning 11/7 -Patient alert and oriented x 4, following commands.  Vital signs stable.  Will recheck lab work and UA culture.  Overall she appears stable this morning although fatigue may be waxing and waning. 11/8 patient was restarted on Bactrim last night for suspected UTI, monitor renal function  11/12- BUN/Cr looking OK off bactrim 23. L radial pulse nonpalpable but dopplerable, BPs different in that arm-- unclear when this started, had some pain in L deltoid area yesterday but seemed to be better today. Suspect this pulse/BP discrepancy is 2/2 her vascular issue already, but reached out to vascular surgery to verify. Dr. Karin Lieu returned page, stated this is known since her surgery; nothing  to do or be concerned about.   24. Pulsatile abdominal aorta: not noticed previously, not mentioned in notes, but today 12/20/22 I noticed it was very prominently pulsatile and pt reported it being sore in that area; spoke with Dr. Karin Lieu again of [facility], recommended getting CTA Abd/pelv to evaluate for AAA. 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 Bradycardia -11/7 patient is being followed by cardiology.  Metoprolol has been stopped and ASA duration was recommended-ASA has been stopped.  -11/8 heart rate doing better today  11/12- HR doing much better- in 80s now 26. Stuffy nose  11/15- added Flonase and saline nasal spray as needed 27. Poor cough  11/15- educated pt on quad coughing- she felt it could help.    I spent a total of  36  minutes on total care today- >50% coordination of care- due to  D/w team about getting her CTA- and f/u on Bms and bladder- d/w Nursing  LOS: 30 days A FACE TO FACE EVALUATION WAS PERFORMED  Heather Molina 01/11/2023, 9:11 AM

## 2023-01-11 NOTE — Progress Notes (Signed)
Inpatient Rehabilitation Discharge Medication Review by a Pharmacist  A complete drug regimen review was completed for this patient to identify any potential clinically significant medication issues.  High Risk Drug Classes Is patient taking? Indication by Medication  Antipsychotic No   Anticoagulant Yes Apixaban - atrial fibrillation  Antibiotic No   Opioid No   Antiplatelet No   Hypoglycemics/insulin No   Vasoactive Medication Yes Amiodarone - atrial fibrillation Amlodipine - hypertension  Chemotherapy No   Other Yes Breztri - COPD Melatonin-sleep Paroxetine - depression Pantoprazole BID for 8 weeks and Sucralfate x 7 more days (2 wks total) - non-bleeding [place] ulcer Pravastatin- HLD Vitamin C, Vitamin D, MVI - supplements Fluticasone - allergies, rhinitis Tamsulosin - urinary retention Bisacodyl, senna - laxatives  PRNs: Acetaminophen - mild pain Lomotil - diarrhea Levalbuterol inhaler or nebs - wheezing, shortness of breath     Type of Medication Issue Identified Description of Issue Recommendation(s)  Drug Interaction(s) (clinically significant)     Duplicate Therapy     Allergy     No Medication Administration End Date     Incorrect Dose     Additional Drug Therapy Needed     Significant med changes from prior encounter (inform family/care partners about these prior to discharge). Aspirin stopped with new Apixaban. Metoprolol stopped due to bradycardia. New Amlodipine.  Staying off furosemide.  Was on arformoterol and levalbuterol nebs while inpatient. Communicate changes with patient/family prior to discharge.    Back to [place] at discharge.  Other       Clinically significant medication issues were identified that warrant physician communication and completion of prescribed/recommended actions by midnight of the next day:  No  Name of provider notified for urgent issues identified:   Provider Method of Notification:   Pharmacist comments:   - has been on NaCl 1gm daily > not ordered for discharge. Na normal today  Time spent performing this drug regimen review (minutes):  [address]  Dennie Fetters, [place] 01/11/2023 6:22 PM

## 2023-01-11 NOTE — Progress Notes (Signed)
IP Rehab Bowel Program Documentation   Bowel Program Start time 2100   Dig Stim Indicated? Yes  Dig Stim Prior to Suppository or mini Enema X 2   Output from dig stim: Small  Ordered intervention: Suppository Yes , mini enema No ,   Repeat dig stim after Suppository or Mini enema  X 2,  Output? Moderate   Bowel Program Complete? Yes , handoff given na   Patient Tolerated? Yes

## 2023-01-11 NOTE — Plan of Care (Signed)
  Problem: Consults Goal: RH SPINAL CORD INJURY PATIENT EDUCATION Description:  See Patient Education module for education specifics.  Outcome: Progressing   Problem: SCI BOWEL ELIMINATION Goal: RH STG MANAGE BOWEL WITH ASSISTANCE Description: STG Manage Bowel with mod Assistance. Outcome: Progressing   Problem: SCI BLADDER ELIMINATION Goal: RH STG MANAGE BLADDER WITH ASSISTANCE Description: STG Manage Bladder With mod Assistance Outcome: Progressing   Problem: RH SKIN INTEGRITY Goal: RH STG SKIN FREE OF INFECTION/BREAKDOWN Description: Incisions will continue to heal and skin be free of infection/breakdown with min assist  Outcome: Progressing Goal: RH STG MAINTAIN SKIN INTEGRITY WITH ASSISTANCE Description: STG Maintain Skin Integrity With min Assistance. Outcome: Progressing Goal: RH STG ABLE TO PERFORM INCISION/WOUND CARE W/ASSISTANCE Description: STG Able To Perform Incision/Wound Care With min Assistance. Outcome: Progressing   Problem: RH SAFETY Goal: RH STG ADHERE TO SAFETY PRECAUTIONS W/ASSISTANCE/DEVICE Description: STG Adhere to Safety Precautions With cueing Assistance/Device. Outcome: Progressing Goal: RH STG DECREASED RISK OF FALL WITH ASSISTANCE Description: STG Decreased Risk of Fall With cueing Assistance. Outcome: Progressing   Problem: RH PAIN MANAGEMENT Goal: RH STG PAIN MANAGED AT OR BELOW PT'S PAIN GOAL Description: Less than 4 with PRN medications min assist  Outcome: Progressing   Problem: RH KNOWLEDGE DEFICIT SCI Goal: RH STG INCREASE KNOWLEDGE OF SELF CARE AFTER SCI Description: Patient/caregiver will be able to manage medications, bowel and bladder care, as well as skin care from nursing education and nursing handouts independently  Outcome: Progressing   Problem: Education: Goal: Knowledge of discharge needs will improve Outcome: Progressing   Problem: Clinical Measurements: Goal: Postoperative complications will be avoided or  minimized Outcome: Progressing   Problem: Respiratory: Goal: Ability to achieve and maintain a regular respiratory rate will improve Outcome: Progressing   Problem: Skin Integrity: Goal: Demonstration of wound healing without infection will improve Outcome: Progressing

## 2023-01-12 NOTE — Progress Notes (Signed)
Inpatient Rehabilitation Care Coordinator Discharge Note   Patient Details  Name: Heather Molina MRN: [id number] Date of Birth: 06-18-1940   Discharge location: HOME WITH DAUGHTER-[name] WHO CAN PROVIDE 24/7 CARE  Length of Stay: 31 DAYS  Discharge activity level: MIN-MOD WHEELCHAIR LEVEL  Home/community participation: ACTIVE  Patient response WU:[id number] Literacy - How often do you need to have someone help you when you read instructions, pamphlets, or other written material from your doctor or pharmacy?: Never  Patient response YN:WGNFAO Isolation - How often do you feel lonely or isolated from those around you?: Never  Services provided included: MD, RD, PT, OT, RN, CM, TR, Pharmacy, Neuropsych, SW  Financial Services:  Field seismologist Utilized: [employer] offered to/list presented to: PT AND DAUGHTER  Follow-up services arranged:  Home Health, DME, Patient/Family request agency HH/DME Home Health Agency: [facility]  PT  OT  RN  AIDE    DME : [facility] BED, DROP ARM BEDSIDE COMMODE AND TRANSFER BOARD HH/DME Requested Agency: [facility]  Patient response to transportation need: Is the patient able to respond to transportation needs?: Yes In the past 12 months, has lack of transportation kept you from medical appointments or from getting medications?: No In the past 12 months, has lack of transportation kept you from meetings, work, or from getting things needed for daily living?: No   Patient/Family verbalized understanding of follow-up arrangements:  Yes  Individual responsible for coordination of the follow-up plan: [name] [phone]  Confirmed correct DME delivered: Lucy Chris 01/12/2023    Comments (or additional information):[name] WAS HERE DAILY AND PARTICIPATED IN MOM'S CARE. AWARE OF HER NEED FOR 24/7 CARE.   Summary of Stay    Date/Time Discharge Planning CSW  01/05/23 [phone] [name] to begin family  training today, we'll need to disucss if pt wants a power chair or just a manuel chair. Family conferecne this Thursday. Home health in place will order DME when have recommendation RGD  12/29/22 1013 Pt's children very close and main caregiver-[name] she is here daily and provides strong support. Family conference Thursday at 10:00 RGD  12/21/22 1544 [name] has been in and observed she will be her main caregiver at home. Pt doing her best therapy tire her out needs to build up her endurance RGD  12/15/22 0837 HOme with daughter and son in-law who are working on ramp for her. Daughter will be main caregiver and comes in every other day. Pt has another daughter also who is supportive RGD       Delmas Faucett, Lemar Livings

## 2023-01-12 NOTE — Progress Notes (Signed)
PROGRESS NOTE   Subjective/Complaints:  Pt reports not really taking pain meds much- usually tylenol if needs something.   Last usage of Oxycodone 11/15- so doesn't need to go home with it.   Minimal output for bowel program last night- ate 50% of eggs and some grits this AM  ROS:    Pt denies SOB, abd pain, CP, N/V/C/D, and vision changes   Except for HPI  Objective:   No results found. Recent Labs    01/11/23 0643  WBC 8.8  HGB 8.6*  HCT 26.3*  PLT 293      Recent Labs    01/11/23 0643  NA 136  K 3.9  CL 105  CO2 26  GLUCOSE 96  BUN 30*  CREATININE 1.00  CALCIUM 8.4*         Intake/Output Summary (Last 24 hours) at 01/12/2023 0757 Last data filed at 01/12/2023 0449 Gross per 24 hour  Intake 480 ml  Output 2500 ml  Net -2020 ml     Pressure Injury 12/12/22 Sacrum Medial Deep Tissue Pressure Injury - Purple or maroon localized area of discolored intact skin or blood-filled blister due to damage of underlying soft tissue from pressure and/or shear. (Active)  12/12/22 1844  Location: Sacrum  Location Orientation: Medial  Staging: Deep Tissue Pressure Injury - Purple or maroon localized area of discolored intact skin or blood-filled blister due to damage of underlying soft tissue from pressure and/or shear.  Wound Description (Comments):   Present on Admission: Yes     Pressure Injury 12/14/22 Heel Left;Lateral;Posterior Stage 2 -  Partial thickness loss of dermis presenting as a shallow open injury with a red, pink wound bed without slough. (Active)  12/14/22 (first assessed by [facility]) 1221  Location: Heel  Location Orientation: Left;Lateral;Posterior  Staging: Stage 2 -  Partial thickness loss of dermis presenting as a shallow open injury with a red, pink wound bed without slough.  Wound Description (Comments):   Present on Admission: No     Pressure Injury 12/14/22 Heel Right;Lateral Stage  2 -  Partial thickness loss of dermis presenting as a shallow open injury with a red, pink wound bed without slough. (Active)  12/14/22 (first assessed by [facility]) 1221  Location: Heel  Location Orientation: Right;Lateral  Staging: Stage 2 -  Partial thickness loss of dermis presenting as a shallow open injury with a red, pink wound bed without slough.  Wound Description (Comments):   Present on Admission: No     Pressure Injury 12/14/22 Ischial tuberosity Left;Medial Stage 2 -  Partial thickness loss of dermis presenting as a shallow open injury with a red, pink wound bed without slough. blister (Active)  12/14/22 (first assessed by [facility] nurse) 1221  Location: Ischial tuberosity  Location Orientation: Left;Medial  Staging: Stage 2 -  Partial thickness loss of dermis presenting as a shallow open injury with a red, pink wound bed without slough.  Wound Description (Comments): blister  Present on Admission: No     Physical Exam: Vital Signs Blood pressure 138/62, pulse 81, temperature 97.9 F (36.6 C), temperature source Oral, resp. rate 18, height 5' (1.524 m), weight 50.5 kg, SpO2 99%.  General: awake, alert, appropriate, sitting up eating breakfast- ~50% eaten;  NAD HENT: conjugate gaze; oropharynx dry- O2 via Craig- 2L CV: regular rate irregular rhythm; no JVD Pulmonary: CTA B/L; no W/R/R-decreased at bases- chronic GI: soft, NT, ND, (+)BS- hypoactive BS Psychiatric: appropriate- reports missing people when leaves Neurological: Ox3 No movement of LE's.  Prior exams MS; was able to move RLE< but from abd muscles- not in LE muscles- no movement seen in LLE either-  Skin: Groin incision site looks okay, bruising noted bilateral shins 2 IVs in place left upper extremity looks okay Extremities: unable to palpate L radial pulse but dopplers confirm pulse with rate in 60-70s, arm well perfused appearing, warm and pink, motor function preserved. Brachial pulse also unable to be felt. R  radial pulse strong and palpable.    Neuro:   Mental Status: AAOx3, memory intact,  Speech/Languate:  fluent, follows simple commands CRANIAL NERVES: II: PERRL. Visual fields full III, IV, VI: EOM intact, no gaze preference or deviation V: normal sensation bilaterally VII: no asymmetry VIII: normal hearing to speech IX, X: normal palatal elevation XI: 5/5 head turn and 5/5 shoulder shrug bilaterally XII: Tongue midline     MOTOR: RUE: 5/5 Deltoid, 5/5 Biceps, 5/5 Triceps,5/5 Grip LUE: 5/5 Deltoid, 5/5 Biceps, 5/5 Triceps, 5/5 Grip RLE: 0 out of 5 throughout LLE: 0 out of 5 throughout, exam stable 11/6   SENSORY: Normal to touch all 4 extremities to light touch in bilateral upper and lower extremities, patchy altered sensation to cold in lower extremities   No ankle clonus bilaterally   Coordination: Normal finger to nose, no dysmetria  Assessment/Plan: 1. Functional deficits which require 3+ hours per day of interdisciplinary therapy in a comprehensive inpatient rehab setting. Physiatrist is providing close team supervision and 24 hour management of active medical problems listed below. Physiatrist and rehab team continue to assess barriers to discharge/monitor patient progress toward functional and medical goals  Care Tool:  Bathing    Body parts bathed by patient: Right arm, Left arm, Chest, Abdomen, Front perineal area, Face, Right upper leg, Left upper leg, Right lower leg, Left lower leg   Body parts bathed by helper: Buttocks     Bathing assist Assist Level: Minimal Assistance - Patient > 75%     Upper Body Dressing/Undressing Upper body dressing   What is the patient wearing?: Pull over shirt    Upper body assist Assist Level: Independent with assistive device Assistive Device Comment: seated in W/C  Lower Body Dressing/Undressing Lower body dressing      What is the patient wearing?: Underwear/pull up     Lower body assist Assist for lower body  dressing: Moderate Assistance - Patient 50 - 74%     Toileting Toileting    Toileting assist Assist for toileting: Total Assistance - Patient < 25%     Transfers Chair/bed transfer  Transfers assist  Chair/bed transfer activity did not occur: Safety/medical concerns  Chair/bed transfer assist level: Minimal Assistance - Patient > 75%     Locomotion Ambulation   Ambulation assist   Ambulation activity did not occur: Safety/medical concerns (paraplegia)          Walk 10 feet activity   Assist  Walk 10 feet activity did not occur: Safety/medical concerns        Walk 50 feet activity   Assist Walk 50 feet with 2 turns activity did not occur: Safety/medical concerns         Walk 150 feet  activity   Assist Walk 150 feet activity did not occur: Safety/medical concerns         Walk 10 feet on uneven surface  activity   Assist Walk 10 feet on uneven surfaces activity did not occur: Safety/medical concerns (paraplegia)         Wheelchair     Assist Is the patient using a wheelchair?: Yes Type of Wheelchair: Power Wheelchair activity did not occur: Safety/medical concerns (paraplegia, fatigue)  Wheelchair assist level: Contact Guard/Touching assist Max wheelchair distance: 300 ft    Wheelchair 50 feet with 2 turns activity    Assist    Wheelchair 50 feet with 2 turns activity did not occur: Safety/medical concerns (paraplegia, fatigue)   Assist Level: Contact Guard/Touching assist   Wheelchair 150 feet activity     Assist  Wheelchair 150 feet activity did not occur: Safety/medical concerns (paraplegia, fatigue)   Assist Level: Contact Guard/Touching assist   Blood pressure 138/62, pulse 81, temperature 97.9 F (36.6 C), temperature source Oral, resp. rate 18, height 5' (1.524 m), weight 50.5 kg, SpO2 99%.  Medical Problem List and Plan: 1. 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  F/u with PCP for f/u on labs- esp BUN/Cr 1-2 weeks after d/c.  2.  Antithrombotics: -DVT/anticoagulation:  Pharmaceutical: Eliquis 2.5mg  BID-- on hold for EGD 01/04/23 11/11- can be restarted in 2 days at 2.5 mg BID per GI-  11/13- restarted Elqiuis -antiplatelet therapy: Aspirin 81 mg daily- discontinued per cardiology recommendation    3. Pain Management: continue Tylenol, robaxin, oxycodone as needed 10/24- will wait to try Tramadol for pain/discomfort til have tried  Remeron 7.5mg  for sleep 10/31- stopped Remeron 4. Mood/Behavior/Sleep: LCSW to evaluate and provide emotional support             -continue paroxetine 20 mg daily (hx of depression)             -antipsychotic agents: n/a -12/13/22 hasn't been sleeping well, increase melatonin to 10mg  QHS, added Trazodone 25mg  PRN but advised to be cautious about using it too late.  10/21- will stop Trazodone- can impair ability to pee- will start Restoril 7.5 mg at bedtime for sleep.  10/22- cannot start Remeron due to risk of torsades de pointes- will con't Restoril  10/23- didn't sleep well again- will increase Restoril to 15 mg at bedtime 10/24- spoke to Cards- they are ok trying Remeron but wait on Tramadol-will start 15 mg at bedtime -will stop Restoril>> down to 7.5mg  -12/19/22 slept "wonderfully"! Cont regimen 10/28- Slept poorly- still on rmeron- sounds like every other night she slept well 10/30- sleeping better 10/31- stopped remeron- slept ok yesterday as well  01/02/23 not sleeping well per patient, but d/t bed; monitor for now 11/11- sleeping "as well as can".   11/19- slept much better last few days 5. Neuropsych/cognition: This patient is capable of making decisions on her own behalf.   6. Skin/Wound Care: Routine skin care checks  7. Fluids/Electrolytes/Nutrition: Routine Is and Os and follow-up chemistries  -01/02/23 BUN down to 27 this morning, Cr 0.92, monitor routinely   11/11- BUN down to 21 from 27 and Cr 1.00- doing better 8: Hypertension: monitor TID and prn (hx of a.fib see meds below). Monitor with mobility. Continue metoprolol 25mg  BID  Reduced Metoprolol per pt request 10/24- will add Norvasc 2.5 mg daily since BP elevated and not coming down  10/25- calling IM- maybe they can help- just added Norvasc yesterday -12/19/22 BPs improving a little this morning, monitor -12/20/22 BPs still a tad elevated but stable, monitor a couple more days before deciding on changes for now 10/28- BP on low side, but Hb low- will transfuse 2 units 10/29- BP looking more labile- but better than it was- monitor for now 11/1: continue current regimen -12/26/22 BPs variable, monitor trend for now 11/5- BP running 130s- 170's- Will increase Norvasc to 5 mg daily  11/7 BP stable, metoprolol discontinued by cardiology 11/8 BP above goal today.  If continues to be elevated cardiology recommends addition of ARB and would not recommend further titration of amlodipine. Will hold off on change for now as would like to avoid hypotension -11/9-10/24 BPs a bit better, cont regimen  11/11- BP doing OK- intermittently in 140's- con't regimen  11/12-11/13 BP in 140s- but holding in low to mid 140s-  11/14- BP running 120s to 140s- off B blocker- family does not want her on one. 11/15- BP running 120s-140s systolic- con't regimen  -11/16-17/24 BPs mostly stable, cont regimen 11/18- 11/19- BP's running 130-150s- con't regimen- f/u with PCP to make additional changes Vitals:   01/09/23 0624 01/09/23 0808 01/09/23 1337 01/09/23 1950  BP: (!) 140/63 (!) 134/54 (!)  131/44 (!) 149/54   01/10/23 0826 01/10/23 1216 01/10/23 1926 01/11/23 0602  BP: (!) 128/55 (!) 142/59 (!) 150/55 (!) 150/67   01/11/23 0941 01/11/23 1414 01/11/23 2031 01/12/23 0438  BP: 131/65 (!) 136/55 (!) 139/54 138/62    9: Hyperlipidemia: continue pravastatin 40mg  daily   10: COPD, O2 dependent 2L via :             -continue Brovana nebs BID             -continue Yupelri neb daily   10/21- Has been on 2L O2 at home per pt and chart  10/25- ABG shows CO2 too low- needs to reduce O2 levels  10/31- Sats ok- con't O2  11/15- stable 11: CAD: continue asa and statin   12: Atrial fibrillation, paroxysmal: maintained on Eliquis reversed pre-operatively             -continue amiodarone 100 mg daily             -continue metoprolol tartrate 25 mg BID             -cleared by [facility] to re-start Eliquis>pharmacy consult placed   10/25- reduced Metoprolol to 12.5 mg BID earlier in week - doing better  10/30- BP doing better 11/8 cardiology does not feel she is currently in A-fib, they recommend continue amiodarone 100 mg daily and avoid nodal agents, continue Eliquis  11/11- held Eliquis due to GI bleed- can restart 11/13  11/13- will restart Eliquis 2.5 mg BID 13: 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 10/22- not voiding -U/A (-)  will add Flomax 0.4 mg q supper to see if she can void- I don't think  it's likely. D/w nursing due to low amount of caths-  -12/19/22 U/A overnight looks infectious, will start Bactrim DS BID x5d, UCx pending; monitor -12/20/22 UCx with >100k CFU of K. Pneumoniae, sensitive to bactrim, cont abx course 10/29- Being cathed- volumes low- pushing fluids, but will also give 1 L IVFs 10/30- not drinking- Got IVFs but BUN up to 45 and Cr 1.41- will call renal- and recheck in AM-- a little improved on 10/31 labs 11/5- drinking much better- volumes looking better- and Cr down to 1.16 and BUN 29 from 40's 11/11- Volumes on caths doing better 11/12- Cath volumes looking good 11/14- caths going ok- explained/educated pt/family about if starts having a lot of leakage between caths, will need to start Gemtesa vs Oxybutynin - 11/15-17 Bladder cath going well  17: ABLA: stable; follow CBC trend             -consider oral iron supplement -12/13/22 Hgb slowly downtrending from postop 8.8 on 10/14>7.6>7.5>7.3>7.0 yesterday; pt asymptomatic at this time; would recheck tomorrow but if still at the 7.0-range then would consider transfusion to help with therapy tolerance; unclear what her prior baseline was (has hgb 12.2 back in July but then down to 9's).    10/21- Hb 7.4 today  10/23- Hb 7.2 yesterday- will check in AM 10/24- Hb down to 7.0- will d/w pt if she wants to be transfused. If so, will give 1 unit pRBCs.  Doesn't have location of losing blood- will check FOBT x3  10/25- Hb 8.1- likely too dry  10/28- Hb 6.7- will transfuse 2 units 10/29- Hb much better AND pt feels MUCH better per pt- Hb up to 9.9- likely so high since pt really dry- will also rewrite for FOBT- hasn't been done  10/30- no bowel results- so cannot check FOBT yet.  10/31- had 2 extra large Bms last night- didn't check FOBT_ will order again. Will d/w nursing 11/4: Hgb reviewed and is stable 11/5- Hb 9.2 11/8 HGB down to 8.7 yesterday, FOBT postive, has had multiple dark stools recovered, will contact GI,  continue PPI -GI recommends to hold Eliquis per note.  Discussed with Dr. Vivianne Master discontinue for now.  Eliquis stopped.  Appreciate GI/cardiology assistance -01/02/23 Hgb stable at 8.2 today, monitor -01/03/23 Hgb down to 8.0; transfuse to keep hgb >7 per GI notes; plan for EGD tomorrow; per notes, PPI IV but PO currently, asked GI and they said PO is fine. Will leave order in place.  11/11- Hb 8.4- will recheck in AM after EGD and see if going up at all- argon used for lesion- might also have esophageal candida- waiting on biopsy results-  -on PPI BID and Carafate QID for 2 weeks per GI- they signed off 11/12- looking at Surgical pathology- doesn' thave fungal elements- so unlikely to be Fungus/thrush- Hb 8.5 11.13- no Thrush/candidada esophagitis per GI  -01/10/23 FOBT+ still, but doubt we need to continue these,  d/c'd order  11/18- Hb 8.6- doing better/stable 18: AKI: improving; CMP 12/12/22 showing Cr 1.31; monitor Monday BMPs 10/21- Cr 1.09 down from 1.31 and BUN 25 down from 25- however is drinking poorly and has poor output- 10/23- Says drinking better- will check BMP in AM  10/25- Cr 0.9 and BUN slightly high at 22- but based on Hb increase, likely dry 10/28- Cr 1.4 and BUN elevated- will give blood and recheck BMP in AM 10/29- BUN up to 40 and Cr 1.41- will need to give IVFs- tried seeing if blood would help, but still needs fluids- will her BUN/Cr- will replete 75 cc/hour after therapy today and recheck BMP in AM- of note, her Cr was running ~ 1.0 and her BUN ~ 25- so big change- will push fluids as well- will give 75cc/hour for 1 liter  10/31- likely form Septra- will stop ABX-  11/4: Cr reviewed and is improving 11/5- Cr 1.16 and BUN 29 down from 40's 11/8 patient was given IV fluids yesterday for elevated BUN, recheck labs today -01/02/23 BUN down to 27, Cr 0.92, monitor 11/11- BUN 21 and Cr 1.0- doing better 11/18- BUN 30- but Cr 1.0- need to push fluids 11/19- will need f/u  with PCP to get labs and f/u on Cr/BUN 19: Neurogenic bowel: bowel program ordered -Dulcolax suppository ordered -12/13/22 per pt LBM yesterday, monitor 10/21- LBM this afternoon/AM- incontinent- educated pt NEEDs bowel program since not having Bms at the time to avoid therapies- going when doesn't want to- this will train gut to go when we wants her to go.  10/22- continue to refuse bowel program-in spite of education of bowel program and need for it.  10/23- pt did last night- had BM with bowel program and one this AM, but educated can take 3-6 weeks to train gut, but should be better- had bowel incontinence for 20 years 10/24- no bowel program documented  but had small BM's at midnight and 5am- said wasn't incontinent?will check on this 10/25- asked pt to get more dig stim this evening since likely cause of bowel incontinent in AM -12/20/22 reports BM this morning but no BM since 12/17/22 documented, no report to nursing staff given; monitor closely but if no BM by tomorrow may need to consider additional adjustments 10/29- LBM yesterday AM, and small with bowel program 10/27-no results last night- if no BM tonight, will give sorbitol/check KUB  10/30- no significant BM for 3 days- will give Sorbitol 30cc at 2pm for bowel program- also add Senna 1 tab daily for meds- 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  -LBM 11/3, continue current regimen Reviewed chart and had BM 11/5 -01/03/23 no further BMs since 11/8, just mucus; see GI plan above 11/11- still having mucus- will order KUB 11/12- Small output last night- KUB shows normal gas and stool pattern- not full of stool- likely due to not eating much.  11/13- no BM last night- will order Sorbitol 30cc 1400 to help her have BM- abd appears flat/not distended- soft 11/14- large BM after bowel program last night with Sorbitol 11/15- small BM last night- daughter called and was upset with nursing coordinator asking when can stop bowel  program- was told that is lifetime.  -01/09/23 no BM since 01/07/23-- will see how today goes, but may need more sorbitol tomorrow if no BM still -01/10/23 LBM yesterday, cont regimen 1119- LBM minimal last night- suggest mg citrate at home if not having BM's.  20. Poor  Appetite 10/22- will add Megace for poor appetite since cannot add Remeron and too sedated to add Periactin. Will also order push fluids since not drinking great- although her BUN?Cr looking better- BUN 25 and Cr 1.08-  10/23- pt feels she's drinking 6+ cups/day- ate muffin and some eggs this AM- better than last 2 days-  10/24- 25% of tray at most- will add Remeron 15 mg at bedtime- ok'd by Cards.  10/25- changed remeron to 7.5 mg QHS 10/30- won't eat anything for breakfast- small appetite overall-  10/31- ate 1 pancake this AM- better than normal 11/11- on full liquid diet today- can switch back to regular diet tomorrow 11/12- ate grits this AM- put back on regular diet- Hb stable 11/13- ate all eggs and toast 21. Prolonged QT interval 10/24- spoke with Cards- they suggested starting Remeron 15 mg at bedtime and then rechecking EKG ina few days to see if can add Tramadol. 10/29- doing ok with remeron- wait to add tramadol for now    22. Oversedation/lethargy/ UTI 10/25- septis and ABG work up- when speaking with PA- appeared to have ST elevation- asked them to call IM consult-  -12/19/22 sleepy but arousable today, U/A+, started bactrim as above, awaiting UCx; CT head neg last night, CBC/BMP stable, TSH/T4 WNL, CXR stable; suspect UTI as source. Monitor. See above #16 11/6: alert and bright this morning 11/7 -Patient alert and oriented x 4, following commands.  Vital signs stable.  Will recheck lab work and UA culture.  Overall she appears stable this morning although fatigue may be waxing and waning. 11/8 patient was restarted on Bactrim last night for suspected UTI, monitor renal function  11/12- BUN/Cr looking OK off  bactrim 23. L radial pulse nonpalpable but dopplerable, BPs different in that arm-- unclear when this started, had some pain in L deltoid area yesterday but seemed to be better today. Suspect this pulse/BP discrepancy is 2/2 her vascular issue already, but reached out to vascular surgery to verify. Dr. Karin Lieu returned page, stated this is known since her surgery; nothing to do or be concerned about.   24. Pulsatile abdominal aorta: not noticed previously, not mentioned in notes, but today 12/20/22 I noticed it was very prominently pulsatile and pt reported it being sore in that area; spoke with Dr. Karin Lieu again of [facility], recommended getting CTA Abd/pelv to evaluate for AAA. 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 Bradycardia -11/7 patient is being followed by cardiology.  Metoprolol has been stopped and ASA duration was recommended-ASA has been stopped.  -11/8 heart rate doing better today  11/12- HR doing much better- in 80s now 26. Stuffy nose  11/15- added Flonase and saline nasal spray as needed 27. Poor cough  11/15- educated pt on quad coughing- she felt it could help.    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- due to SCI, she meets criteria for ultra light w/c-  Ideally suggest due to inability to do pressure relief or push self more than a few feet in manual w/c due to frailty and O2 dependent-and generalized UB weakness- has no LE movement-   Regulatory affairs officer [employer]  ADI Deep Contour Back (4" Contour)  Headrest  Right-Handed  Swing Away Mount  Gel Arm Pads for Elbows    Patient has urinary retention due to neurogenic bladder from paraplegia- /SCI- there is no chance of getting back ability to void on her own-  this is a chronic issue now- I.e >12 months-  so will need for 1 year at a time- needs 5 catheters/day- of 16 french catheters, so 150 catheters/month-  She also needs Gloves, chucks, and incontinence supplies due to bowel incontinence from Neurogenic bowel monthly- this will not improve, so needs chronically- I.e. >12 months     I spent a total of 34   minutes on total care today- >50% coordination of care- due to  D/w team about w/c needs and cathers/incontinence supplies needs- as well as d/w pt about CTA and results pending- will need to f/u with Vascular   The patient is medically ready for discharge to home and will need follow-up with [facility]. In addition, they will need to follow up with their PCP,  and vascular- CTA has been done- not read yet-  will need to have PCP appt in next 1-2 weeks and f/u on labs- including CMP/CBC-diff. .     LOS: 31 days A FACE TO FACE EVALUATION WAS PERFORMED  Lacorey Brusca 01/12/2023, 7:57 AM

## 2023-01-12 NOTE — Progress Notes (Signed)
[facility] Bowel Program Documentation   Bowel Program Start time 2200   Dig Stim Indicated? Yes  Dig Stim Prior to Suppository or mini Enema X 2   Output from dig stim: Small  Ordered intervention: Suppository Yes , mini enema No ,   Repeat dig stim after Suppository or Mini enema  X 1,  Output? Minimal   Bowel Program Complete? Yes , handoff given na  Patient Tolerated? Yes

## 2023-01-15 ENCOUNTER — Encounter: Payer: Medicare Other | Admitting: Vascular Surgery

## 2023-02-04 ENCOUNTER — Other Ambulatory Visit: Payer: Self-pay

## 2023-02-11 ENCOUNTER — Ambulatory Visit: Payer: Medicare Other | Admitting: Cardiology

## 2023-02-26 ENCOUNTER — Encounter: Payer: Medicare Other | Admitting: Physical Medicine and Rehabilitation

## 2023-03-17 ENCOUNTER — Other Ambulatory Visit (HOSPITAL_COMMUNITY): Payer: Self-pay

## 2023-04-24 DEATH — deceased
